# Patient Record
Sex: Female | Born: 1965 | Race: Black or African American | Hispanic: No | Marital: Single | Smoking: Former smoker
Health system: Southern US, Community
[De-identification: ages and names within clinical notes are randomized; demographics above are authoritative.]

## PROBLEM LIST (undated history)

## (undated) DIAGNOSIS — K652 Spontaneous bacterial peritonitis: Secondary | ICD-10-CM

## (undated) DIAGNOSIS — K7460 Unspecified cirrhosis of liver: Secondary | ICD-10-CM

## (undated) DIAGNOSIS — I10 Essential (primary) hypertension: Secondary | ICD-10-CM

## (undated) DIAGNOSIS — D539 Nutritional anemia, unspecified: Secondary | ICD-10-CM

## (undated) DIAGNOSIS — K8020 Calculus of gallbladder without cholecystitis without obstruction: Secondary | ICD-10-CM

## (undated) DIAGNOSIS — K429 Umbilical hernia without obstruction or gangrene: Secondary | ICD-10-CM

## (undated) DIAGNOSIS — R188 Other ascites: Secondary | ICD-10-CM

## (undated) DIAGNOSIS — D696 Thrombocytopenia, unspecified: Secondary | ICD-10-CM

## (undated) DIAGNOSIS — M1990 Unspecified osteoarthritis, unspecified site: Secondary | ICD-10-CM

## (undated) DIAGNOSIS — B1920 Unspecified viral hepatitis C without hepatic coma: Secondary | ICD-10-CM

## (undated) DIAGNOSIS — J90 Pleural effusion, not elsewhere classified: Secondary | ICD-10-CM

## (undated) HISTORY — PX: CARPAL TUNNEL RELEASE: SHX101

## (undated) HISTORY — PX: ANKLE SURGERY: SHX546

## (undated) HISTORY — PX: CHOLECYSTECTOMY: SHX55

---

## 2000-10-15 ENCOUNTER — Emergency Department (HOSPITAL_COMMUNITY): Admission: EM | Admit: 2000-10-15 | Discharge: 2000-10-15 | Payer: Self-pay | Admitting: Emergency Medicine

## 2001-02-11 ENCOUNTER — Emergency Department (HOSPITAL_COMMUNITY): Admission: EM | Admit: 2001-02-11 | Discharge: 2001-02-11 | Payer: Self-pay | Admitting: Emergency Medicine

## 2001-04-23 ENCOUNTER — Emergency Department (HOSPITAL_COMMUNITY): Admission: EM | Admit: 2001-04-23 | Discharge: 2001-04-23 | Payer: Self-pay

## 2001-04-23 ENCOUNTER — Encounter: Payer: Self-pay | Admitting: Emergency Medicine

## 2003-05-09 ENCOUNTER — Emergency Department (HOSPITAL_COMMUNITY): Admission: EM | Admit: 2003-05-09 | Discharge: 2003-05-09 | Payer: Self-pay | Admitting: Emergency Medicine

## 2005-02-03 ENCOUNTER — Emergency Department (HOSPITAL_COMMUNITY): Admission: EM | Admit: 2005-02-03 | Discharge: 2005-02-03 | Payer: Self-pay | Admitting: Emergency Medicine

## 2010-08-22 ENCOUNTER — Emergency Department (HOSPITAL_COMMUNITY)
Admission: EM | Admit: 2010-08-22 | Discharge: 2010-08-22 | Disposition: A | Discharge status: Home / Self Care | Payer: Self-pay | Attending: Emergency Medicine | Admitting: Emergency Medicine

## 2010-08-22 DIAGNOSIS — N61 Mastitis without abscess: Secondary | ICD-10-CM | POA: Insufficient documentation

## 2010-08-22 DIAGNOSIS — L089 Local infection of the skin and subcutaneous tissue, unspecified: Secondary | ICD-10-CM | POA: Insufficient documentation

## 2010-08-22 DIAGNOSIS — R21 Rash and other nonspecific skin eruption: Secondary | ICD-10-CM | POA: Insufficient documentation

## 2011-06-25 ENCOUNTER — Encounter (HOSPITAL_COMMUNITY): Payer: Self-pay | Admitting: *Deleted

## 2011-06-25 ENCOUNTER — Emergency Department (HOSPITAL_COMMUNITY)
Admission: EM | Admit: 2011-06-25 | Discharge: 2011-06-25 | Disposition: A | Discharge status: Home / Self Care | Payer: Self-pay | Attending: Emergency Medicine | Admitting: Emergency Medicine

## 2011-06-25 DIAGNOSIS — L299 Pruritus, unspecified: Secondary | ICD-10-CM | POA: Insufficient documentation

## 2011-06-25 DIAGNOSIS — F17200 Nicotine dependence, unspecified, uncomplicated: Secondary | ICD-10-CM | POA: Insufficient documentation

## 2011-06-25 LAB — POCT I-STAT, CHEM 8
HCT: 50 % — ABNORMAL HIGH (ref 36.0–46.0)
Hemoglobin: 17 g/dL — ABNORMAL HIGH (ref 12.0–15.0)
Potassium: 5 mEq/L (ref 3.5–5.1)
Sodium: 141 mEq/L (ref 135–145)

## 2011-06-25 MED ORDER — HYDROXYZINE HCL 25 MG PO TABS
25.0000 mg | ORAL_TABLET | Freq: Once | ORAL | Status: AC
  Administered 2011-06-25: 25 mg via ORAL
  Filled 2011-06-25: qty 1

## 2011-06-25 MED ORDER — HYDROXYZINE HCL 25 MG PO TABS: 25.0000 mg | ORAL_TABLET | Freq: Once | ORAL | Status: AC

## 2011-06-25 NOTE — ED Provider Notes (Signed)
History   This chart was scribed for Linda Munch, MD by Shari Heritage. The patient was seen in room TR09C/TR09C. Patient's care was started at 1148.     CSN: [id number]  Arrival date & time 06/25/11  1148   First MD Initiated Contact with Patient 06/25/11 1313      Chief Complaint  Patient presents with  . Pruritis    (Consider location/radiation/quality/duration/timing/severity/associated sxs/prior treatment) The history is provided by the patient. No language interpreter was used.   Linda Vaughn is a 46 y.o. female who presents to the Emergency Department complaining of constant pruiritis all over her body with associated pain onset 3 days ago. Patient denies any rash. Patient believes that the itchiness may be due to anxiety or nerves. Patient denies a new job, detergent, sheets or other factors. Patient says she took children's Benadryl (liquid) yesterday with no relief. Patient is a current everyday smoker. Patient says that she has stopped for 6 years before. Patient has no PCP. Patient says the last time she saw a physician was when she was incarcerated. Patient was released in 2012. Patient says she has a h/o of polysubstance abuse, but has been sober for 7 years now.  History reviewed. No pertinent past medical history.  Past Surgical History  Procedure Date  . Ankle surgery   . Carpal tunnel release     No family history on file.  History  Substance Use Topics  . Smoking status: Current Everyday Smoker  . Smokeless tobacco: Not on file  . Alcohol Use: Yes    OB History    Grav Para Term Preterm Abortions TAB SAB Ect Mult Living                  Review of Systems  Constitutional:       Per HPI, otherwise negative  HENT:       Per HPI, otherwise negative  Eyes: Negative.   Respiratory:       Per HPI, otherwise negative  Cardiovascular:       Per HPI, otherwise negative  Gastrointestinal: Negative for vomiting.  Genitourinary: Negative.     Musculoskeletal:       Per HPI, otherwise negative  Skin: Negative.   Neurological: Negative for syncope.    Allergies  Review of patient's allergies indicates no known allergies.  Home Medications  No current outpatient prescriptions on file.  BP 135/89  Pulse 95  Temp 98.2 F (36.8 C) (Oral)  Resp 18  Ht 5' (1.524 m)  Wt 220 lb (99.791 kg)  BMI 42.97 kg/m2  SpO2 97%  Physical Exam  Nursing note and vitals reviewed. Constitutional: She is oriented to person, place, and time. She appears well-developed and well-nourished. No distress.  HENT:  Head: Normocephalic and atraumatic.  Eyes: Conjunctivae and EOM are normal.  Cardiovascular: Normal rate and regular rhythm.   Pulmonary/Chest: Effort normal and breath sounds normal. No stridor. No respiratory distress.  Abdominal: She exhibits no distension.  Musculoskeletal: She exhibits no edema.  Neurological: She is alert and oriented to person, place, and time. No cranial nerve deficit.  Skin: Skin is warm and dry.  Psychiatric: She has a normal mood and affect.    ED Course  Procedures (including critical care time)   COORDINATION OF CARE: 1:50PM- Patient informed of current plan for treatment and evaluation and agrees with plan at this time.   Results for orders placed during the hospital encounter of 06/25/11  POCT I-STAT, CHEM 8  Component Value Range   Sodium 141  135 - 145 mEq/L   Potassium 5.0  3.5 - 5.1 mEq/L   Chloride 106  96 - 112 mEq/L   BUN 16  6 - 23 mg/dL   Creatinine, Ser 1.61  0.50 - 1.10 mg/dL   Glucose, Bld 86  70 - 99 mg/dL   Calcium, Ion 0.96  1.12 - 1.32 mmol/L   TCO2 25  0 - 100 mmol/L   Hemoglobin 17.0 (*) 12.0 - 15.0 g/dL   HCT 04.5 (*) 40.9 - 81.1 %    No diagnosis found.   On repeat evaluation the patient notes resolution of her symptoms. MDM  I personally performed the services described in this documentation, which was scribed in my presence. The recorded information has  been reviewed and considered.  This generally well female presents with new diffuse pruritic complaint.  On exam she is in no distress, with no discernible cutaneous lesions.  The patient's denial of any respiratory complaints his reassuring.  Following provision of Atarax the patient had resolution of her symptoms.  Although a clear etiology is not discernible, there is some suspicion of ongoing allergic process.  This was discussed with the patient, who was discharged in stable condition with her symptoms resolved  Linda Munch, MD 06/26/11 [phone]

## 2011-06-25 NOTE — ED Notes (Signed)
Patient reports onset of itching and pain all over for 3 days.  She has tried benadryl w/o relief.  Patient states it feels like it it related to her nerves.  Patient denies rash

## 2011-06-25 NOTE — Discharge Instructions (Signed)
It is very important that you continue to have your condition and monitored by your primary care physician.  Please make sure to call today to arrange the next appropriate followup visit.  If you develop any new, or concerning changes in your condition, please return to the emergency department he immediately.

## 2011-11-12 ENCOUNTER — Emergency Department (HOSPITAL_COMMUNITY)
Admission: EM | Admit: 2011-11-12 | Discharge: 2011-11-12 | Disposition: A | Discharge status: Home / Self Care | Payer: Self-pay | Attending: Emergency Medicine | Admitting: Emergency Medicine

## 2011-11-12 ENCOUNTER — Emergency Department (HOSPITAL_COMMUNITY): Payer: Self-pay

## 2011-11-12 ENCOUNTER — Encounter (HOSPITAL_COMMUNITY): Payer: Self-pay | Admitting: Family Medicine

## 2011-11-12 DIAGNOSIS — M7120 Synovial cyst of popliteal space [Baker], unspecified knee: Secondary | ICD-10-CM

## 2011-11-12 DIAGNOSIS — F17200 Nicotine dependence, unspecified, uncomplicated: Secondary | ICD-10-CM | POA: Insufficient documentation

## 2011-11-12 DIAGNOSIS — M19079 Primary osteoarthritis, unspecified ankle and foot: Secondary | ICD-10-CM

## 2011-11-12 DIAGNOSIS — M7989 Other specified soft tissue disorders: Secondary | ICD-10-CM

## 2011-11-12 DIAGNOSIS — M79609 Pain in unspecified limb: Secondary | ICD-10-CM

## 2011-11-12 LAB — CBC WITH DIFFERENTIAL/PLATELET
Eosinophils Absolute: 0.1 10*3/uL (ref 0.0–0.7)
Lymphocytes Relative: 65 % — ABNORMAL HIGH (ref 12–46)
Lymphs Abs: 4.4 10*3/uL — ABNORMAL HIGH (ref 0.7–4.0)
Neutrophils Relative %: 21 % — ABNORMAL LOW (ref 43–77)
Platelets: 165 10*3/uL (ref 150–400)
RBC: 3.91 MIL/uL (ref 3.87–5.11)
WBC: 6.7 10*3/uL (ref 4.0–10.5)

## 2011-11-12 LAB — BASIC METABOLIC PANEL
CO2: 22 mEq/L (ref 19–32)
GFR calc non Af Amer: >90 mL/min (ref >90)
Glucose, Bld: 115 mg/dL — ABNORMAL HIGH (ref 70–99)
Potassium: 4.2 mEq/L (ref 3.5–5.1)
Sodium: 138 mEq/L (ref 135–145)

## 2011-11-12 LAB — PROTIME-INR
INR: 1.15 (ref 0.00–1.49)
Prothrombin Time: 14.5 seconds (ref 11.6–15.2)

## 2011-11-12 LAB — APTT: aPTT: 36 seconds (ref 24–37)

## 2011-11-12 MED ORDER — HYDROCODONE-ACETAMINOPHEN 5-325 MG PO TABS
1.0000 | ORAL_TABLET | Freq: Once | ORAL | Status: AC
  Administered 2011-11-12: 1 via ORAL
  Filled 2011-11-12: qty 1

## 2011-11-12 MED ORDER — NAPROXEN 250 MG PO TABS
500.0000 mg | ORAL_TABLET | Freq: Once | ORAL | Status: AC
  Administered 2011-11-12: 500 mg via ORAL
  Filled 2011-11-12: qty 2

## 2011-11-12 MED ORDER — HYDROCODONE-ACETAMINOPHEN 5-325 MG PO TABS: 1.0000 | ORAL_TABLET | Freq: Four times a day (QID) | ORAL | Status: DC | PRN

## 2011-11-12 NOTE — ED Provider Notes (Addendum)
History   This chart was scribed for Celene Kras, MD by Shari Heritage. This patient was seen in room TR06C/TR06C and the patient's care was started at 1:10PM.   CSN: [id number]  Arrival date & time 11/12/11  1247   First MD Initiated Contact with Patient 11/12/11 1310      Chief Complaint  Patient presents with  . Ankle Pain    The history is provided by the patient. No language interpreter was used.   Linda Vaughn is a 46 y.o. female who presents to the Emergency Department complaining of constant, sharp, moderate to severe, constant, bilateral lower leg pain and swelling onset 3 weeks ago. Patient also reports moderate, constant, gradually worsening left ankle pain. Patient denies chest pain or SOB. Patient denies any recent falls, injury or trauma. Patient has a history of ankle surgery and states there is a plate in her left ankle. She is not on any estrogen medication. Patient denies any recent long plane or car trips. Patient has no PCP and has not followed up with anyone else about these issues. She states that she has no health insurance. Patient is a current every day smoker.  Past Surgical History  Procedure Date  . Ankle surgery   . Carpal tunnel release     History reviewed. No pertinent family history.  History  Substance Use Topics  . Smoking status: Current Every Day Smoker  . Smokeless tobacco: Not on file  . Alcohol Use: Yes   No OB history provided.  Review of Systems A complete 10 system review of systems was obtained and all systems are negative except as noted in the HPI and PMH.   Allergies  Review of patient's allergies indicates no known allergies.  Home Medications  No current outpatient prescriptions on file.  BP 133/76  Pulse 98  Temp 98 F (36.7 C)  Resp 18  SpO2 98%  Physical Exam  Nursing note and vitals reviewed. Constitutional: She appears well-developed and well-nourished. No distress.  HENT:  Head: Normocephalic and atraumatic.    Right Ear: External ear normal.  Left Ear: External ear normal.  Eyes: Conjunctivae normal are normal. Right eye exhibits no discharge. Left eye exhibits no discharge. No scleral icterus.  Neck: Neck supple. No tracheal deviation present.  Cardiovascular: Normal rate, regular rhythm and intact distal pulses.   Pulmonary/Chest: Effort normal and breath sounds normal. No stridor. No respiratory distress. She has no wheezes. She has no rales.  Abdominal: Soft. Bowel sounds are normal. She exhibits no distension. There is no tenderness. There is no rebound and no guarding.  Musculoskeletal: She exhibits no edema and no tenderness.       Hypersensitive to the touch left ankle and left calf. Mild tenderness to right calf as well. No edema or erythema. No cords.   Neurological: She is alert. She has normal strength. No sensory deficit. Cranial nerve deficit:  no gross defecits noted. She exhibits normal muscle tone. She displays no seizure activity. Coordination normal.  Skin: Skin is warm and dry. No rash noted.  Psychiatric: She has a normal mood and affect.    ED Course  Procedures (including critical care time)  DIAGNOSTIC STUDIES: Oxygen Saturation is 98% on room air, nomral by my interpretation.    COORDINATION OF CARE: 1:14 PM Discussed treatment plan with pt at bedside and pt agreed to plan.   Labs Reviewed  D-DIMER, QUANTITATIVE - Abnormal; Notable for the following:    D-Dimer, Quant 1.27 (*)  All other components within normal limits    Dg Ankle Complete Left  11/12/2011  *RADIOLOGY REPORT*  Clinical Data: Ankle pain  LEFT ANKLE COMPLETE - 3+ VIEW  Comparison: None.  Findings: Three views of the left ankle submitted.  No acute fracture or subluxation.  Two metallic fixation screws are noted in distal left tibia medial malleolus.  Metallic fixation plate and screws noted in distal left fibula.  Degenerative changes noted tibiotalar and fibulotalar joint.  Diffuse mild soft  tissue swelling.  Plantar spur of the calcaneus is noted.  Mild dorsal spurring tarsal region.  IMPRESSION: No acute fracture or subluxation.  Postsurgical and degenerative changes as described above.   Original Report Authenticated By: Natasha Mead, M.D.       MDM  Pt has symptoms most suggestive of arthritis issues associated with her prior injury however she does have some calf ttp and complaints of pain in that area. D dimer positive.  Will plan on ultrasound for further evaluation.      I personally performed the services described in this documentation, which was scribed in my presence.  The recorded information has been reviewed and considered.    Celene Kras, MD 11/12/11 1505  5:30 PM  VASCULAR LAB  PRELIMINARY PRELIMINARY PRELIMINARY PRELIMINARY  Bilateral lower extremity venous duplex completed.  Preliminary report: Bilateral: No evidence of DVT or superficial thrombosis. Left: Baker's cyst in the popliteal fossa.  CESTONE, HELENE, RVT  11/12/2011, 5:16 PM    DC home with ortho follow up.    Celene Kras, MD 11/12/11 [phone]

## 2011-11-12 NOTE — Progress Notes (Signed)
VASCULAR LAB PRELIMINARY  PRELIMINARY  PRELIMINARY  PRELIMINARY  Bilateral lower extremity venous duplex  completed.    Preliminary report:  Bilateral:  No evidence of DVT or superficial thrombosis.  Left: Baker's cyst in the popliteal fossa.   Kvon Mcilhenny, RVT 11/12/2011, 5:16 PM

## 2011-11-12 NOTE — ED Notes (Signed)
Paged ortho 

## 2011-11-12 NOTE — ED Notes (Addendum)
Per pt having left ankle pain and swelling. Denies injury sts she has a plate in left ankle. sts also left wrist and hand pain.

## 2011-11-12 NOTE — Progress Notes (Signed)
Orthopedic Tech Progress Note Patient Details:  Linda Vaughn May 28, 1965 [id number]  Ortho Devices Type of Ortho Device: ASO;Crutches Ortho Device/Splint Location: left ankle Ortho Device/Splint Interventions: Application   Linda Vaughn 11/12/2011, 5:50 PM

## 2011-11-12 NOTE — ED Notes (Signed)
Pt c/o bilateral lower leg swelling, pt reports she has had swelling in left leg from a plate that she previously had.

## 2012-01-02 ENCOUNTER — Encounter (HOSPITAL_COMMUNITY): Payer: Self-pay | Admitting: Emergency Medicine

## 2012-01-02 ENCOUNTER — Emergency Department (HOSPITAL_COMMUNITY)
Admission: EM | Admit: 2012-01-02 | Discharge: 2012-01-02 | Disposition: A | Discharge status: Home / Self Care | Payer: Self-pay | Attending: Emergency Medicine | Admitting: Emergency Medicine

## 2012-01-02 ENCOUNTER — Emergency Department (HOSPITAL_COMMUNITY): Payer: Self-pay

## 2012-01-02 DIAGNOSIS — R1013 Epigastric pain: Secondary | ICD-10-CM | POA: Insufficient documentation

## 2012-01-02 DIAGNOSIS — R109 Unspecified abdominal pain: Secondary | ICD-10-CM

## 2012-01-02 DIAGNOSIS — F17200 Nicotine dependence, unspecified, uncomplicated: Secondary | ICD-10-CM | POA: Insufficient documentation

## 2012-01-02 DIAGNOSIS — Z8739 Personal history of other diseases of the musculoskeletal system and connective tissue: Secondary | ICD-10-CM | POA: Insufficient documentation

## 2012-01-02 HISTORY — DX: Unspecified osteoarthritis, unspecified site: M19.90

## 2012-01-02 LAB — COMPREHENSIVE METABOLIC PANEL
ALT: 78 U/L — ABNORMAL HIGH (ref 0–35)
AST: 156 U/L — ABNORMAL HIGH (ref 0–37)
Albumin: 2.9 g/dL — ABNORMAL LOW (ref 3.5–5.2)
Alkaline Phosphatase: 152 U/L — ABNORMAL HIGH (ref 39–117)
Glucose, Bld: 109 mg/dL — ABNORMAL HIGH (ref 70–99)
Potassium: 5.4 mEq/L — ABNORMAL HIGH (ref 3.5–5.1)
Sodium: 134 mEq/L — ABNORMAL LOW (ref 135–145)
Total Protein: 8.4 g/dL — ABNORMAL HIGH (ref 6.0–8.3)

## 2012-01-02 LAB — CBC
Hemoglobin: 14.1 g/dL (ref 12.0–15.0)
MCHC: 34.4 g/dL (ref 30.0–36.0)
Platelets: 188 10*3/uL (ref 150–400)

## 2012-01-02 LAB — URINALYSIS, ROUTINE W REFLEX MICROSCOPIC
Glucose, UA: NEGATIVE mg/dL
Hgb urine dipstick: NEGATIVE
Specific Gravity, Urine: 1.041 — ABNORMAL HIGH (ref 1.005–1.030)
pH: 6 (ref 5.0–8.0)

## 2012-01-02 LAB — URINE MICROSCOPIC-ADD ON

## 2012-01-02 LAB — POCT I-STAT TROPONIN I: Troponin i, poc: 0.01 ng/mL (ref 0.00–0.08)

## 2012-01-02 MED ORDER — ONDANSETRON HCL 4 MG/2ML IJ SOLN
4.0000 mg | Freq: Once | INTRAMUSCULAR | Status: AC
  Administered 2012-01-02: 4 mg via INTRAVENOUS
  Filled 2012-01-02: qty 2

## 2012-01-02 MED ORDER — HYDROCODONE-ACETAMINOPHEN 5-325 MG PO TABS: 2.0000 | ORAL_TABLET | ORAL | Status: DC | PRN

## 2012-01-02 MED ORDER — ESOMEPRAZOLE MAGNESIUM 40 MG PO CPDR: 40.0000 mg | DELAYED_RELEASE_CAPSULE | Freq: Every day | ORAL | Status: DC

## 2012-01-02 MED ORDER — MORPHINE SULFATE 4 MG/ML IJ SOLN
4.0000 mg | Freq: Once | INTRAMUSCULAR | Status: AC
  Administered 2012-01-02: 4 mg via INTRAVENOUS
  Filled 2012-01-02: qty 1

## 2012-01-02 NOTE — ED Notes (Signed)
Patient still unable to void at this time. Patient being transported to ultrasound and will try to void once she returns

## 2012-01-02 NOTE — ED Notes (Signed)
Verbal order given by ellen/rn to in and out cath patient.  Sterile technique used during procedure.  No complications to report.  Patient tolerated well.  Urine amber in color and clear.

## 2012-01-02 NOTE — ED Notes (Signed)
Patient transported to Ultrasound 

## 2012-01-02 NOTE — ED Notes (Signed)
Patient with abdominal pain with some nausea since 3am this morning.  Patient denies any CP.  Patient denies vomiting.

## 2012-01-02 NOTE — ED Notes (Signed)
Informed patient that we need a urine sample. Patient unable to void at this time. Patient will alert staff when she can use the restroom. Will continue to monitor.

## 2012-01-02 NOTE — ED Provider Notes (Signed)
Assumed care for patient at shift change with ultrasound pending. Ultrasound does show cholelithiasis. There is some gallbladder wall thickening, but no apparent cholecystic fluid. Consider cholecystitis, but doubt. Patient with no tenderness on my exam. She's not distended. She is afebrile. Has not been having vomiting. No leukocytosis. Her pain is currently reported to be minimal. Will discharge the prescription for pain medicine and surgical followup. Emergent return cautions were discussed.  Raeford Razor, MD 01/02/12 [phone]

## 2012-01-02 NOTE — ED Provider Notes (Addendum)
History     CSN: [id number]  Arrival date & time 01/02/12  0509   First MD Initiated Contact with Patient 01/02/12 0522      Chief Complaint  Patient presents with  . Abdominal Pain    (Consider location/radiation/quality/duration/timing/severity/associated sxs/prior treatment) Patient is a 46 y.o. female presenting with abdominal pain. The history is provided by the patient.  Abdominal Pain The primary symptoms of the illness include abdominal pain. The current episode started 1 to 2 hours ago. The onset of the illness was gradual. The problem has not changed since onset. The patient states that she believes she is currently not pregnant. The patient has not had a change in bowel habit.    Past Medical History  Diagnosis Date  . Arthritis     Past Surgical History  Procedure Date  . Ankle surgery   . Carpal tunnel release     No family history on file.  History  Substance Use Topics  . Smoking status: Current Every Day Smoker  . Smokeless tobacco: Not on file  . Alcohol Use: Yes    OB History    Grav Para Term Preterm Abortions TAB SAB Ect Mult Living                  Review of Systems  Gastrointestinal: Positive for abdominal pain.  All other systems reviewed and are negative.    Allergies  Review of patient's allergies indicates no known allergies.  Home Medications   Current Outpatient Rx  Name  Route  Sig  Dispense  Refill  . HYDROCODONE-ACETAMINOPHEN 5-325 MG PO TABS   Oral   Take 1-2 tablets by mouth every 6 (six) hours as needed for pain.   16 tablet   0     BP 141/98  Pulse 79  Temp 98.2 F (36.8 C) (Oral)  Resp 16  SpO2 96%  Physical Exam  Constitutional: She is oriented to person, place, and time. She appears well-developed and well-nourished.  HENT:  Head: Normocephalic and atraumatic.  Eyes: Conjunctivae normal and EOM are normal. Pupils are equal, round, and reactive to light.  Neck: Normal range of motion.    Cardiovascular: Normal rate, regular rhythm and normal heart sounds.   Pulmonary/Chest: Effort normal and breath sounds normal.  Abdominal: Soft. Bowel sounds are normal. There is tenderness.    Musculoskeletal: Normal range of motion.  Neurological: She is alert and oriented to person, place, and time.  Skin: Skin is warm and dry.  Psychiatric: She has a normal mood and affect. Her behavior is normal.    ED Course  Procedures (including critical care time)   Labs Reviewed  CBC  COMPREHENSIVE METABOLIC PANEL  LIPASE, BLOOD  URINALYSIS, ROUTINE W REFLEX MICROSCOPIC   No results found.   No diagnosis found.    MDM  + epigastric pain since am.  Will lab,  reassess        Rosanne Ashing, MD 01/02/12 0549  Oluwadara Gorman Lytle Michaels, MD 01/02/12 [phone]

## 2012-01-03 LAB — URINE CULTURE: Culture: NO GROWTH

## 2012-04-07 ENCOUNTER — Emergency Department (HOSPITAL_COMMUNITY)
Admission: EM | Admit: 2012-04-07 | Discharge: 2012-04-07 | Disposition: A | Discharge status: Home / Self Care | Payer: Self-pay | Attending: Emergency Medicine | Admitting: Emergency Medicine

## 2012-04-07 ENCOUNTER — Emergency Department (HOSPITAL_COMMUNITY): Payer: Self-pay

## 2012-04-07 ENCOUNTER — Encounter (HOSPITAL_COMMUNITY): Payer: Self-pay | Admitting: Emergency Medicine

## 2012-04-07 DIAGNOSIS — Z7982 Long term (current) use of aspirin: Secondary | ICD-10-CM | POA: Insufficient documentation

## 2012-04-07 DIAGNOSIS — IMO0002 Reserved for concepts with insufficient information to code with codable children: Secondary | ICD-10-CM | POA: Insufficient documentation

## 2012-04-07 DIAGNOSIS — M1710 Unilateral primary osteoarthritis, unspecified knee: Secondary | ICD-10-CM | POA: Insufficient documentation

## 2012-04-07 DIAGNOSIS — M1990 Unspecified osteoarthritis, unspecified site: Secondary | ICD-10-CM

## 2012-04-07 DIAGNOSIS — B353 Tinea pedis: Secondary | ICD-10-CM

## 2012-04-07 DIAGNOSIS — M7989 Other specified soft tissue disorders: Secondary | ICD-10-CM | POA: Insufficient documentation

## 2012-04-07 DIAGNOSIS — M7120 Synovial cyst of popliteal space [Baker], unspecified knee: Secondary | ICD-10-CM

## 2012-04-07 DIAGNOSIS — F17200 Nicotine dependence, unspecified, uncomplicated: Secondary | ICD-10-CM | POA: Insufficient documentation

## 2012-04-07 DIAGNOSIS — M79609 Pain in unspecified limb: Secondary | ICD-10-CM

## 2012-04-07 DIAGNOSIS — I10 Essential (primary) hypertension: Secondary | ICD-10-CM

## 2012-04-07 LAB — POCT I-STAT, CHEM 8
Chloride: 111 mEq/L (ref 96–112)
Glucose, Bld: 114 mg/dL — ABNORMAL HIGH (ref 70–99)
HCT: 42 % (ref 36.0–46.0)
Potassium: 4.3 mEq/L (ref 3.5–5.1)
Sodium: 137 mEq/L (ref 135–145)

## 2012-04-07 MED ORDER — MICONAZOLE NITRATE 2 % EX CREA: TOPICAL_CREAM | Freq: Two times a day (BID) | CUTANEOUS | Status: DC

## 2012-04-07 MED ORDER — NAPROXEN 375 MG PO TABS: 375.0000 mg | ORAL_TABLET | Freq: Two times a day (BID) | ORAL | Status: DC

## 2012-04-07 MED ORDER — IBUPROFEN 800 MG PO TABS
800.0000 mg | ORAL_TABLET | Freq: Once | ORAL | Status: AC
  Administered 2012-04-07: 800 mg via ORAL
  Filled 2012-04-07: qty 1

## 2012-04-07 MED ORDER — IBUPROFEN 800 MG PO TABS: 800.0000 mg | ORAL_TABLET | Freq: Three times a day (TID) | ORAL | Status: DC | PRN

## 2012-04-07 NOTE — Progress Notes (Signed)
VASCULAR LAB PRELIMINARY  PRELIMINARY  PRELIMINARY  PRELIMINARY  Bilateral lower extremity venous duplex completed.    Preliminary report:  Bilateral:  No evidence of DVT or superficial thrombosis. No evidence of Right Baker's cyst. There is a small Baker's cyst in the left popliteal fossa measuring 2.1 cm x 1.2 cm   Elgene Coral, RVS 04/07/2012, 1:47 PM

## 2012-04-07 NOTE — ED Notes (Signed)
Pt here dropped off via pov for c/o bilat leg pain with rt knee pain x3 wks pain not relieved with otc meds

## 2012-04-07 NOTE — ED Provider Notes (Signed)
History     CSN: [id number]  Arrival date & time 04/07/12  1024   First MD Initiated Contact with Patient 04/07/12 1025      Chief Complaint  Patient presents with  . Extremity Pain    rt knee  . Knee Pain    (Consider location/radiation/quality/duration/timing/severity/associated sxs/prior treatment) HPI Comments: Linda Vaughn is a 47 y.o. female with history of arthritis presents emergency department complaining of bilateral lower 70 pain.  Onset of symptoms began approximately 4-5 weeks ago and has been gradually worsening.  Pain is described as both knee pain and calf pain.  Patient reports knee stiffness in morning lasting anywhere from 30 minutes to an hour as well as after sitting for long times.  Associated symptoms or crepitus.  Calf pain onset was 2-3 weeks ago, worsened with ambulation and relieved with rest.  Associated symptoms include bilateral leg swelling.  Patient denies history of DVT, recent travel, cough, hemoptysis, chest pain, shortness of breath, dyspnea on exertion.  She is not currently on hormone replacement therapy.  Lastly patient reports that she's been suffering from pruritus of the bottom of her feet and is concerned that she has a fungal infection.  No other complaints this time.  Patient denies fevers, night sweats, chills, recent trauma.  The history is provided by the patient.    Past Medical History  Diagnosis Date  . Arthritis     Past Surgical History  Procedure Laterality Date  . Ankle surgery    . Carpal tunnel release      Family History  Problem Relation Age of Onset  . Cancer Mother   . Rheum arthritis Father   . Diabetes Other     History  Substance Use Topics  . Smoking status: Current Every Day Smoker  . Smokeless tobacco: Not on file  . Alcohol Use: Yes    OB History   Grav Para Term Preterm Abortions TAB SAB Ect Mult Living                  Review of Systems  All other systems reviewed and are  negative.    Allergies  Review of patient's allergies indicates no known allergies.  Home Medications   Current Outpatient Rx  Name  Route  Sig  Dispense  Refill  . aspirin 325 MG tablet   Oral   Take 325 mg by mouth every 6 (six) hours as needed for pain.           BP 146/100  Pulse 87  Temp(Src) 98.1 F (36.7 C) (Oral)  Resp 18  SpO2 96%  Physical Exam  Nursing note and vitals reviewed. Constitutional: She is oriented to person, place, and time. She appears well-developed and well-nourished. No distress.  Hypertensive   HENT:  Head: Normocephalic and atraumatic.  Eyes: Conjunctivae and EOM are normal.  Neck: Normal range of motion. Neck supple.  Cardiovascular:  Intact distal pulses, capillary refill < 3 seconds  Pulmonary/Chest: Effort normal.  Musculoskeletal: Normal range of motion.  Significant tenderness to palpation of both m these extending posteriorly from calf to thigh as well as anterior knees.  Pain with both passive and active flexion and extension.  All other extremities with normal ROM  Neurological: She is alert and oriented to person, place, and time.  No sensory deficit  Skin: Skin is warm and dry. No rash noted. She is not diaphoretic.  Thickened peeling skin on plantar surface of feet bilaterally  Psychiatric: She  has a normal mood and affect. Her behavior is normal.    ED Course  Procedures (including critical care time)  Labs Reviewed  POCT I-STAT, CHEM 8 - Abnormal; Notable for the following:    Glucose, Bld 114 (*)    Calcium, Ion 0.93 (*)    All other components within normal limits   Dg Knee Complete 4 Views Left  04/07/2012  *RADIOLOGY REPORT*  Clinical Data: Knee pain  LEFT KNEE - COMPLETE 4+ VIEW  Comparison: None.  Findings: No fracture.  No subluxation or dislocation.  No joint effusion.  Hypertrophic spurring is visible in the medial and patellofemoral compartments.  IMPRESSION: Mild degenerative changes without acute bony  findings.   Original Report Authenticated By: Kennith Center, M.D.    Dg Knee Complete 4 Views Right  04/07/2012  *RADIOLOGY REPORT*  Clinical Data: The anterior pain.  No known injury.  RIGHT KNEE - COMPLETE 4+ VIEW  Comparison: None.  Findings: No detectable joint effusion.  Weightbearing compartments show normal joint height.  There is mild narrowing at the patellofemoral joint.  There is some calcification in the quadriceps and patellar tendons of a chronic nature.  IMPRESSION: Evidence of patellofemoral osteoarthritis.  No other finding of note.   Original Report Authenticated By: Paulina Fusi, M.D.    Per Vascular Imaging: No evidence of DVT. Positive Baker cyst as seen before  No diagnosis found.    MDM  Arthritis- dc with naproxen and PCP follow up  Hypertension- advised PCP follow up and re-start of medication (pt is supposed to be on Norvasc, but has been non compliant). Had in depth discussion about problems poor controlled BP can cause. Pt verbalized understanding. Presentation non concerning for any end organ failure. Crt and BUN nl.  Tinea pedis- topical antifungal given         Jaci Carrel, PA-C 04/07/12 1408

## 2012-04-08 NOTE — ED Provider Notes (Signed)
Medical screening examination/treatment/procedure(s) were performed by non-physician practitioner and as supervising physician I was immediately available for consultation/collaboration.  Christopher J. Pollina, MD 04/08/12 1301 

## 2012-04-16 ENCOUNTER — Encounter (HOSPITAL_COMMUNITY): Payer: Self-pay | Admitting: Cardiology

## 2012-04-16 ENCOUNTER — Emergency Department (HOSPITAL_COMMUNITY)
Admission: EM | Admit: 2012-04-16 | Discharge: 2012-04-16 | Disposition: A | Discharge status: Home / Self Care | Payer: Self-pay | Attending: Emergency Medicine | Admitting: Emergency Medicine

## 2012-04-16 DIAGNOSIS — I10 Essential (primary) hypertension: Secondary | ICD-10-CM | POA: Insufficient documentation

## 2012-04-16 DIAGNOSIS — M79605 Pain in left leg: Secondary | ICD-10-CM

## 2012-04-16 DIAGNOSIS — F17200 Nicotine dependence, unspecified, uncomplicated: Secondary | ICD-10-CM | POA: Insufficient documentation

## 2012-04-16 DIAGNOSIS — M129 Arthropathy, unspecified: Secondary | ICD-10-CM | POA: Insufficient documentation

## 2012-04-16 DIAGNOSIS — M25569 Pain in unspecified knee: Secondary | ICD-10-CM | POA: Insufficient documentation

## 2012-04-16 DIAGNOSIS — Z8719 Personal history of other diseases of the digestive system: Secondary | ICD-10-CM | POA: Insufficient documentation

## 2012-04-16 MED ORDER — HYDROCODONE-ACETAMINOPHEN 5-325 MG PO TABS: 1.0000 | ORAL_TABLET | Freq: Four times a day (QID) | ORAL | Status: DC | PRN

## 2012-04-16 NOTE — ED Provider Notes (Signed)
History     CSN: [id number]  Arrival date & time 04/16/12  6962   First MD Initiated Contact with Patient 04/16/12 0902      Chief Complaint  Patient presents with  . Leg Pain    (Consider location/radiation/quality/duration/timing/severity/associated sxs/prior treatment) HPI Comments: Patient with a history of Arthritis presenting with bilateral knee pain.   Pain has been present for the past 6 weeks and is gradually worsening.  Pain worse with walking for an extended period of time.  She states that she does a lot of walking at her job.   She reports that the pain extends to the calf area, but is primarily in the knee.  She denies numbness or tingling.  Denies erythema or edema.  No acute injury or trauma.  She has not noticed any erythema, edema, or warmth.  No history of DVT.  Patient denies recent travel, cough, hemoptysis, chest pain, shortness of breath, dyspnea on exertion.  She is not currently on hormone replacement therapy.   She has been taking Ibuprofen for the pain without relief.  She has had recent xrays of both knees, which were negative aside from OA.  She also had a recent Doppler of both legs, which was negative for DVT.  Patient is a 47 y.o. female presenting with leg pain. The history is provided by the patient.  Leg Pain Associated symptoms: no fever     Past Medical History  Diagnosis Date  . Arthritis     Past Surgical History  Procedure Laterality Date  . Ankle surgery    . Carpal tunnel release      Family History  Problem Relation Age of Onset  . Cancer Mother   . Rheum arthritis Father   . Diabetes Other     History  Substance Use Topics  . Smoking status: Current Every Day Smoker  . Smokeless tobacco: Not on file  . Alcohol Use: Yes    OB History   Grav Para Term Preterm Abortions TAB SAB Ect Mult Living                  Review of Systems  Constitutional: Negative for fever and chills.  Respiratory: Negative for shortness of breath.    Cardiovascular: Negative for chest pain.  Musculoskeletal:       Bilateral knee pain  Skin: Negative for color change.  All other systems reviewed and are negative.    Allergies  Review of patient's allergies indicates no known allergies.  Home Medications  No current outpatient prescriptions on file.  BP 132/92  Pulse 76  Temp(Src) 97.5 F (36.4 C) (Oral)  Resp 18  SpO2 97%  Physical Exam  Nursing note and vitals reviewed. Constitutional: She appears well-developed and well-nourished. No distress.  HENT:  Head: Normocephalic and atraumatic.  Cardiovascular: Normal rate, regular rhythm and normal heart sounds.   Pulses:      Dorsalis pedis pulses are 2+ on the right side, and 2+ on the left side.  Pulmonary/Chest: Effort normal and breath sounds normal.  Musculoskeletal: Normal range of motion.       Right knee: She exhibits normal range of motion, no swelling, no deformity and no erythema. Tenderness found.       Left knee: She exhibits normal range of motion, no swelling and no erythema. Tenderness found.  No edema, erythema, or warmth of the lower extremities bilaterally Negative Homan's sign bilaterally  Neurological: She is alert. No sensory deficit.  Skin: Skin is  warm and dry. She is not diaphoretic. No erythema.  Psychiatric: She has a normal mood and affect.    ED Course  Procedures (including critical care time)  Labs Reviewed - No data to display No results found.   No diagnosis found.    MDM  Patient presents to the ED for bilateral knee pain.  Full ROM.  No obvious signs of infection.  She had been seen in the ED previously for this and had xrays that were negative aside from OA.  She has also had a bilateral Doppler, which was negative for DVT on 04/07/12.  Therefore, feel that pain may be secondary to Arthritis.  Patient given short course of pain medication and instructed to follow up with PCP.        Pascal Lux [place], PA-C 04/18/12  [phone]

## 2012-04-16 NOTE — ED Notes (Signed)
Pt c/o bilateral calf and knee pain, exacerbated with weight bearing and palpation. No reddness, warmth or obvious size difference between calves. Pulses palpated in distal extremities. Sensation intact. Denies bowel bladder incontinence. Pt able to ambulate without difficulty.

## 2012-04-16 NOTE — ED Notes (Signed)
Pt reports bilateral leg pain over the past couple of days. States she does a lot of walking at her job and standing her her feet. Denies any injury to the area.

## 2012-04-20 ENCOUNTER — Encounter (HOSPITAL_COMMUNITY): Payer: Self-pay | Admitting: *Deleted

## 2012-04-20 ENCOUNTER — Emergency Department (HOSPITAL_COMMUNITY): Payer: No Typology Code available for payment source

## 2012-04-20 ENCOUNTER — Emergency Department (HOSPITAL_COMMUNITY)
Admission: EM | Admit: 2012-04-20 | Discharge: 2012-04-20 | Disposition: A | Discharge status: Home / Self Care | Payer: No Typology Code available for payment source | Attending: Emergency Medicine | Admitting: Emergency Medicine

## 2012-04-20 DIAGNOSIS — R1011 Right upper quadrant pain: Secondary | ICD-10-CM

## 2012-04-20 DIAGNOSIS — K8020 Calculus of gallbladder without cholecystitis without obstruction: Secondary | ICD-10-CM

## 2012-04-20 DIAGNOSIS — E669 Obesity, unspecified: Secondary | ICD-10-CM | POA: Diagnosis present

## 2012-04-20 DIAGNOSIS — R109 Unspecified abdominal pain: Secondary | ICD-10-CM | POA: Insufficient documentation

## 2012-04-20 DIAGNOSIS — Z8739 Personal history of other diseases of the musculoskeletal system and connective tissue: Secondary | ICD-10-CM | POA: Insufficient documentation

## 2012-04-20 DIAGNOSIS — R112 Nausea with vomiting, unspecified: Secondary | ICD-10-CM | POA: Insufficient documentation

## 2012-04-20 DIAGNOSIS — R079 Chest pain, unspecified: Secondary | ICD-10-CM | POA: Insufficient documentation

## 2012-04-20 DIAGNOSIS — I10 Essential (primary) hypertension: Secondary | ICD-10-CM | POA: Insufficient documentation

## 2012-04-20 HISTORY — DX: Essential (primary) hypertension: I10

## 2012-04-20 HISTORY — DX: Calculus of gallbladder without cholecystitis without obstruction: K80.20

## 2012-04-20 LAB — CBC WITH DIFFERENTIAL/PLATELET
Basophils Relative: 1 % (ref 0–1)
HCT: 40.5 % (ref 36.0–46.0)
HCT: 39.1 % (ref 36.0–46.0)
Hemoglobin: 14.4 g/dL (ref 12.0–15.0)
Hemoglobin: 14 g/dL (ref 12.0–15.0)
Lymphocytes Relative: 52 % — ABNORMAL HIGH (ref 12–46)
Lymphs Abs: 3.5 10*3/uL (ref 0.7–4.0)
Lymphs Abs: 3 10*3/uL (ref 0.7–4.0)
MCHC: 35.6 g/dL (ref 30.0–36.0)
MCHC: 35.8 g/dL (ref 30.0–36.0)
Monocytes Absolute: 0.9 10*3/uL (ref 0.1–1.0)
Monocytes Absolute: 0.8 10*3/uL (ref 0.1–1.0)
Monocytes Relative: 13 % — ABNORMAL HIGH (ref 3–12)
Monocytes Relative: 12 % (ref 3–12)
Neutro Abs: 2.1 10*3/uL (ref 1.7–7.7)
Neutro Abs: 2.2 10*3/uL (ref 1.7–7.7)
Neutrophils Relative %: 37 % — ABNORMAL LOW (ref 43–77)
RBC: 4.15 MIL/uL (ref 3.87–5.11)
RBC: 3.95 MIL/uL (ref 3.87–5.11)
WBC: 6.8 10*3/uL (ref 4.0–10.5)

## 2012-04-20 LAB — COMPREHENSIVE METABOLIC PANEL
ALT: 71 U/L — ABNORMAL HIGH (ref 0–35)
AST: 120 U/L — ABNORMAL HIGH (ref 0–37)
Alkaline Phosphatase: 174 U/L — ABNORMAL HIGH (ref 39–117)
BUN: 12 mg/dL (ref 6–23)
CO2: 25 mEq/L (ref 19–32)
Calcium: 8.5 mg/dL (ref 8.4–10.5)
Chloride: 107 mEq/L (ref 96–112)
Creatinine, Ser: 0.65 mg/dL (ref 0.50–1.10)
Creatinine, Ser: 0.59 mg/dL (ref 0.50–1.10)
GFR calc Af Amer: >90 mL/min (ref >90)
GFR calc non Af Amer: >90 mL/min (ref >90)
GFR calc non Af Amer: >90 mL/min (ref >90)
Glucose, Bld: 119 mg/dL — ABNORMAL HIGH (ref 70–99)
Potassium: 4 mEq/L (ref 3.5–5.1)
Sodium: 137 mEq/L (ref 135–145)
Total Bilirubin: 0.9 mg/dL (ref 0.3–1.2)
Total Protein: 7.3 g/dL (ref 6.0–8.3)

## 2012-04-20 LAB — LIPASE, BLOOD: Lipase: 27 U/L (ref 11–59)

## 2012-04-20 MED ORDER — SODIUM CHLORIDE 0.9 % IV SOLN
1000.0000 mL | Freq: Once | INTRAVENOUS | Status: AC
  Administered 2012-04-20: 1000 mL via INTRAVENOUS

## 2012-04-20 MED ORDER — SODIUM CHLORIDE 0.9 % IV SOLN
1000.0000 mL | INTRAVENOUS | Status: DC
  Administered 2012-04-20: 1000 mL via INTRAVENOUS

## 2012-04-20 MED ORDER — HYDROMORPHONE HCL PF 1 MG/ML IJ SOLN
1.0000 mg | INTRAMUSCULAR | Status: DC | PRN
  Administered 2012-04-20 (×2): 1 mg via INTRAVENOUS
  Filled 2012-04-20 (×2): qty 1

## 2012-04-20 MED ORDER — ONDANSETRON HCL 4 MG/2ML IJ SOLN
4.0000 mg | Freq: Once | INTRAMUSCULAR | Status: AC
  Administered 2012-04-20: 4 mg via INTRAVENOUS
  Filled 2012-04-20: qty 2

## 2012-04-20 MED ORDER — OXYCODONE-ACETAMINOPHEN 5-325 MG PO TABS: 1.0000 | ORAL_TABLET | ORAL | Status: DC | PRN

## 2012-04-20 NOTE — ED Notes (Signed)
Pt to ER from shelter via EMS; pt c/o abd pain that began this afternoon; pt states that she has a history of gallstones and thinks this is the same thing; pt c/o nausea no vomiting.

## 2012-04-20 NOTE — ED Provider Notes (Signed)
History     CSN: [id number]  Arrival date & time 04/20/12  0109   First MD Initiated Contact with Patient 04/20/12 0448      Chief Complaint  Patient presents with  . Abdominal Pain     Patient is a 47 y.o. female presenting with abdominal pain. The history is provided by the patient.  Abdominal Pain Pain location:  Epigastric Pain radiates to:  Epigastric region and RUQ Pain severity:  Severe Onset quality:  Gradual Duration:  4 hours Timing:  Constant Context: eating   Relieved by:  Nothing Worsened by:  Nothing tried Associated symptoms: chest pain, nausea and vomiting   Associated symptoms: no fever   Pt had hot dogs for dinner last night. Pt was diagnosed with gallstones in December.  She did not follow up with a surgeon as instructed  She thought the stone would pass.  Past Medical History  Diagnosis Date  . Arthritis   . Hypertension   . Gallstones     Past Surgical History  Procedure Laterality Date  . Ankle surgery    . Carpal tunnel release    . Ankle surgery    . Cesarean section      Family History  Problem Relation Age of Onset  . Cancer Mother   . Rheum arthritis Father   . Diabetes Other     History  Substance Use Topics  . Smoking status: Current Every Day Smoker -- 0.50 packs/day  . Smokeless tobacco: Not on file  . Alcohol Use: Yes     Comment: occ    OB History   Grav Para Term Preterm Abortions TAB SAB Ect Mult Living                  Review of Systems  Constitutional: Negative for fever.  Cardiovascular: Positive for chest pain.  Gastrointestinal: Positive for nausea, vomiting and abdominal pain.  All other systems reviewed and are negative.    Allergies  Review of patient's allergies indicates no known allergies.  Home Medications   Current Outpatient Rx  Name  Route  Sig  Dispense  Refill  . ibuprofen (ADVIL,MOTRIN) 800 MG tablet   Oral   Take 800 mg by mouth every 8 (eight) hours as needed for pain.          Marland Kitchen oxyCODONE-acetaminophen (PERCOCET/ROXICET) 5-325 MG per tablet   Oral   Take 1-2 tablets by mouth every 4 (four) hours as needed for pain.   20 tablet   0     BP 110/64  Pulse 85  Temp(Src) 97.9 F (36.6 C) (Oral)  Resp 18  SpO2 95%  Physical Exam  Nursing note and vitals reviewed. Constitutional: She appears well-developed and well-nourished. No distress.  HENT:  Head: Normocephalic and atraumatic.  Right Ear: External ear normal.  Left Ear: External ear normal.  Eyes: Conjunctivae are normal. Right eye exhibits no discharge. Left eye exhibits no discharge. No scleral icterus.  Neck: Neck supple. No tracheal deviation present.  Cardiovascular: Normal rate, regular rhythm and intact distal pulses.   Pulmonary/Chest: Effort normal and breath sounds normal. No stridor. No respiratory distress. She has no wheezes. She has no rales.  Abdominal: Soft. Bowel sounds are normal. She exhibits no distension. There is tenderness in the right upper quadrant and epigastric area. There is guarding. There is no rigidity and no rebound.  Musculoskeletal: She exhibits no edema and no tenderness.  Neurological: She is alert. She has normal strength. No sensory  deficit. Cranial nerve deficit:  no gross defecits noted. She exhibits normal muscle tone. She displays no seizure activity. Coordination normal.  Skin: Skin is warm and dry. No rash noted.  Psychiatric: She has a normal mood and affect.    ED Course  Procedures (including critical care time) Medications  ondansetron (ZOFRAN) injection 4 mg (4 mg Intravenous Given 04/20/12 0529)  0.9 %  sodium chloride infusion (0 mLs Intravenous Stopped 04/20/12 0653)   4:25 AM patient states she's feeling better. However, on abdominal exam she still significantly tender in epigastrium and right upper quadrant  Labs Reviewed  CBC WITH DIFFERENTIAL - Abnormal; Notable for the following:    MCH 34.7 (*)    Neutrophils Relative 32 (*)    Lymphocytes  Relative 52 (*)    Monocytes Relative 13 (*)    All other components within normal limits  COMPREHENSIVE METABOLIC PANEL - Abnormal; Notable for the following:    Glucose, Bld 110 (*)    Albumin 2.5 (*)    AST 118 (*)    ALT 72 (*)    Alkaline Phosphatase 174 (*)    All other components within normal limits  COMPREHENSIVE METABOLIC PANEL - Abnormal; Notable for the following:    Glucose, Bld 119 (*)    Albumin 2.4 (*)    AST 120 (*)    ALT 71 (*)    Alkaline Phosphatase 168 (*)    All other components within normal limits  CBC WITH DIFFERENTIAL - Abnormal; Notable for the following:    MCH 35.4 (*)    Neutrophils Relative 37 (*)    Lymphocytes Relative 49 (*)    All other components within normal limits  LIPASE, BLOOD   US Abdomen Complete  04/20/2012  *RADIOLOGY REPORT*  Clinical Data:  Abdominal pain.  ABDOMINAL ULTRASOUND COMPLETE  Comparison:  Abdominal ultrasound performed 01/02/2012  Findings:  Gallbladder:  A few stones are again seen layering dependently within the gallbladder, without evidence for obstruction or cholecystitis.  The gallbladder wall remains normal in thickness; no pericholecystic fluid is seen.  No ultrasonographic Murphy's sign is elicited.  Common Bile Duct:  0.6 cm in diameter; within normal limits in caliber.  Liver:  Mildly increased parenchymal echogenicity and coarsened echotexture may reflect mild fatty infiltration; no focal lesions identified.  The liver may be mildly nodular in contour, raising question for mild cirrhotic change.  Limited Doppler evaluation demonstrates normal blood flow within the liver.  IVC:  Unremarkable in appearance.  Pancreas:  Although the pancreas is difficult to visualize in its entirety due to overlying bowel gas, no focal pancreatic abnormality is identified.  Spleen:  10.0 cm in length; within normal limits in size and echotexture.  Right kidney:  11.5 cm in length; normal in size, configuration and parenchymal echogenicity.   No evidence of mass or hydronephrosis.  Left kidney:  10.7 cm in length; normal in size, configuration and parenchymal echogenicity.  No evidence of mass or hydronephrosis.  Abdominal Aorta:  Not well characterized due to overlying bowel gas and the patient's habitus.  IMPRESSION:  1.  Cholelithiasis again noted; no evidence for obstruction or cholecystitis. 2.  Likely mild fatty infiltration within the liver.  The mildly nodular contour of the liver raises question for mild cirrhotic change.   Original Report Authenticated By: Tonia Ghent, M.D.      1. Cholelithiasis       MDM  The patient is having persistent biliary colic. Will consult with the general  surgery service regarding possible cholecystectomy        Celene Kras, MD 04/21/12 [id number]

## 2012-04-20 NOTE — Consult Note (Signed)
Linda Vaughn Jun 03, 1965  [id number].    Requesting MD: Pascal Lux [facility] Chief Complaint/Reason for Consult: RUQ abdominal pain, nausea and vomiting, cholelithiasis HPI:  47 y/o female PMH obesity, arthritis, HTN presents to [facility] for RUQ abdominal pain since last night after eating 2 hot dogs for dinner.  She also notes she vomited 2 times.  Pain is achy which comes and goes and has been relieved since since given pain medication her at the hospital.  Pt notes this happened one other time about a year ago after eating a hot dog then as well.  Pt denies any fever/chills, change in bowel/bladder habits, no sick contacts.  Pt notes she just started a job at the [employer] team and she doesn't want to have surgery because she can't miss work.  She's currently living in a shelter and trying to save money.     Family History  Problem Relation Age of Onset  . Cancer Mother   . Rheum arthritis Father   . Diabetes Other     Past Medical History  Diagnosis Date  . Arthritis   . Hypertension   . Gallstones     Past Surgical History  Procedure Laterality Date  . Ankle surgery    . Carpal tunnel release    . Ankle surgery    . Cesarean section      Social History:  reports that she has been smoking.  She does not have any smokeless tobacco history on file. She reports that  drinks alcohol. She reports that she does not use illicit drugs. smokes 1ppd over 3 weeks  Allergies: No Known Allergies   (Not in a hospital admission)  Blood pressure 119/74, pulse 86, temperature 97.7 F (36.5 C), temperature source Oral, resp. rate 18, SpO2 95.00%. Physical Exam: General: pleasant, WD/WN AA female who is laying in bed in NAD HEENT: head is normocephalic, atraumatic.  Sclera are noninjected.  PERRL.  Ears and nose without any masses or lesions.  Mouth is pink and moist Heart: regular, rate, and rhythm.  No obvious murmurs, gallops, or rubs noted.  Palpable pedal pulses  bilaterally Lungs: CTAB, no wheezes, rhonchi, or rales noted.  Respiratory effort nonlabored Abd: soft, ND, mild tenderness in RUQ and epigastrium, +BS, no masses, hernias, or organomegaly, low horizontal pelvic scar well healed MS: all 4 extremities are symmetrical with no cyanosis, clubbing, or edema. Skin: warm and dry with no masses, lesions, or rashes Psych: A&Ox3 with an appropriate affect.  Results for orders placed during the hospital encounter of 04/20/12 (from the past 48 hour(s))  CBC WITH DIFFERENTIAL     Status: Abnormal   Collection Time    04/20/12  2:41 AM      Result Value Range   WBC 6.8  4.0 - 10.5 K/uL   RBC 4.15  3.87 - 5.11 MIL/uL   Hemoglobin 14.4  12.0 - 15.0 g/dL   HCT 40.9  81.1 - 91.4 %   MCV 97.6  78.0 - 100.0 fL   MCH 34.7 (*) 26.0 - 34.0 pg   MCHC 35.6  30.0 - 36.0 g/dL   RDW 78.2  95.6 - 21.3 %   Platelets 169  150 - 400 K/uL   Neutrophils Relative 32 (*) 43 - 77 %   Neutro Abs 2.1  1.7 - 7.7 K/uL   Lymphocytes Relative 52 (*) 12 - 46 %   Lymphs Abs 3.5  0.7 - 4.0 K/uL   Monocytes Relative 13 (*)  3 - 12 %   Monocytes Absolute 0.9  0.1 - 1.0 K/uL   Eosinophils Relative 3  0 - 5 %   Eosinophils Absolute 0.2  0.0 - 0.7 K/uL   Basophils Relative 1  0 - 1 %   Basophils Absolute 0.0  0.0 - 0.1 K/uL  COMPREHENSIVE METABOLIC PANEL     Status: Abnormal   Collection Time    04/20/12  2:41 AM      Result Value Range   Sodium 139  135 - 145 mEq/L   Potassium 4.0  3.5 - 5.1 mEq/L   Chloride 107  96 - 112 mEq/L   CO2 25  19 - 32 mEq/L   Glucose, Bld 110 (*) 70 - 99 mg/dL   BUN 12  6 - 23 mg/dL   Creatinine, Ser 1.61  0.50 - 1.10 mg/dL   Calcium 8.4  8.4 - 09.6 mg/dL   Total Protein 7.7  6.0 - 8.3 g/dL   Albumin 2.5 (*) 3.5 - 5.2 g/dL   AST 045 (*) 0 - 37 U/L   ALT 72 (*) 0 - 35 U/L   Alkaline Phosphatase 174 (*) 39 - 117 U/L   Total Bilirubin 0.9  0.3 - 1.2 mg/dL   GFR calc non Af Amer >90  >90 mL/min   GFR calc Af Amer >90  >90 mL/min   Comment:             The eGFR has been calculated     using the CKD EPI equation.     This calculation has not been     validated in all clinical     situations.     eGFR's persistently     <90 mL/min signify     possible Chronic Kidney Disease.  LIPASE, BLOOD     Status: None   Collection Time    04/20/12  2:41 AM      Result Value Range   Lipase 27  11 - 59 U/L  COMPREHENSIVE METABOLIC PANEL     Status: Abnormal   Collection Time    04/20/12  5:26 AM      Result Value Range   Sodium 137  135 - 145 mEq/L   Potassium 4.0  3.5 - 5.1 mEq/L   Chloride 105  96 - 112 mEq/L   CO2 24  19 - 32 mEq/L   Glucose, Bld 119 (*) 70 - 99 mg/dL   BUN 11  6 - 23 mg/dL   Creatinine, Ser 4.09  0.50 - 1.10 mg/dL   Calcium 8.5  8.4 - 81.1 mg/dL   Total Protein 7.3  6.0 - 8.3 g/dL   Albumin 2.4 (*) 3.5 - 5.2 g/dL   AST 914 (*) 0 - 37 U/L   ALT 71 (*) 0 - 35 U/L   Alkaline Phosphatase 168 (*) 39 - 117 U/L   Total Bilirubin 1.0  0.3 - 1.2 mg/dL   GFR calc non Af Amer >90  >90 mL/min   GFR calc Af Amer >90  >90 mL/min   Comment:            The eGFR has been calculated     using the CKD EPI equation.     This calculation has not been     validated in all clinical     situations.     eGFR's persistently     <90 mL/min signify     possible Chronic Kidney Disease.  CBC WITH DIFFERENTIAL  Status: Abnormal   Collection Time    04/20/12  5:26 AM      Result Value Range   WBC 6.1  4.0 - 10.5 K/uL   RBC 3.95  3.87 - 5.11 MIL/uL   Hemoglobin 14.0  12.0 - 15.0 g/dL   HCT 96.2  95.2 - 84.1 %   MCV 99.0  78.0 - 100.0 fL   MCH 35.4 (*) 26.0 - 34.0 pg   MCHC 35.8  30.0 - 36.0 g/dL   RDW 32.4  40.1 - 02.7 %   Platelets 156  150 - 400 K/uL   Neutrophils Relative 37 (*) 43 - 77 %   Neutro Abs 2.2  1.7 - 7.7 K/uL   Lymphocytes Relative 49 (*) 12 - 46 %   Lymphs Abs 3.0  0.7 - 4.0 K/uL   Monocytes Relative 12  3 - 12 %   Monocytes Absolute 0.8  0.1 - 1.0 K/uL   Eosinophils Relative 2  0 - 5 %   Eosinophils  Absolute 0.1  0.0 - 0.7 K/uL   Basophils Relative 1  0 - 1 %   Basophils Absolute 0.0  0.0 - 0.1 K/uL   US Abdomen Complete  04/20/2012  *RADIOLOGY REPORT*  Clinical Data:  Abdominal pain.  ABDOMINAL ULTRASOUND COMPLETE  Comparison:  Abdominal ultrasound performed 01/02/2012  Findings:  Gallbladder:  A few stones are again seen layering dependently within the gallbladder, without evidence for obstruction or cholecystitis.  The gallbladder wall remains normal in thickness; no pericholecystic fluid is seen.  No ultrasonographic Murphy's sign is elicited.  Common Bile Duct:  0.6 cm in diameter; within normal limits in caliber.  Liver:  Mildly increased parenchymal echogenicity and coarsened echotexture may reflect mild fatty infiltration; no focal lesions identified.  The liver may be mildly nodular in contour, raising question for mild cirrhotic change.  Limited Doppler evaluation demonstrates normal blood flow within the liver.  IVC:  Unremarkable in appearance.  Pancreas:  Although the pancreas is difficult to visualize in its entirety due to overlying bowel gas, no focal pancreatic abnormality is identified.  Spleen:  10.0 cm in length; within normal limits in size and echotexture.  Right kidney:  11.5 cm in length; normal in size, configuration and parenchymal echogenicity.  No evidence of mass or hydronephrosis.  Left kidney:  10.7 cm in length; normal in size, configuration and parenchymal echogenicity.  No evidence of mass or hydronephrosis.  Abdominal Aorta:  Not well characterized due to overlying bowel gas and the patient's habitus.  IMPRESSION:  1.  Cholelithiasis again noted; no evidence for obstruction or cholecystitis. 2.  Likely mild fatty infiltration within the liver.  The mildly nodular contour of the liver raises question for mild cirrhotic change.   Original Report Authenticated By: Tonia Ghent, M.D.        Assessment/Plan Cholelithiasis, exam consistent with biliary colic, no evidence  of true cholecystitis 1.  Offered patient surgery today, but she refused due to trying to keep her new job 2.  She agrees to f/u in the office once she is able to establish insurance or save money for the deposit 3.  Would offer her pain medication and diet modification and recommend she see a PCP upon discharge  H/O HTN not currently on meds Obesity BMI >30     DORT, Moria Brophy 04/20/2012, 8:09 AM Pager: [phone]

## 2012-04-25 NOTE — ED Provider Notes (Signed)
Medical screening examination/treatment/procedure(s) were performed by non-physician practitioner and as supervising physician I was immediately available for consultation/collaboration.  Flint Melter, MD 04/25/12 2133

## 2012-04-28 ENCOUNTER — Emergency Department (HOSPITAL_COMMUNITY)
Admission: EM | Admit: 2012-04-28 | Discharge: 2012-04-28 | Disposition: A | Discharge status: Home / Self Care | Payer: No Typology Code available for payment source | Attending: Emergency Medicine | Admitting: Emergency Medicine

## 2012-04-28 ENCOUNTER — Encounter (HOSPITAL_COMMUNITY): Payer: Self-pay | Admitting: *Deleted

## 2012-04-28 ENCOUNTER — Emergency Department (HOSPITAL_COMMUNITY): Payer: No Typology Code available for payment source

## 2012-04-28 DIAGNOSIS — M1710 Unilateral primary osteoarthritis, unspecified knee: Secondary | ICD-10-CM | POA: Insufficient documentation

## 2012-04-28 DIAGNOSIS — R05 Cough: Secondary | ICD-10-CM

## 2012-04-28 DIAGNOSIS — M7989 Other specified soft tissue disorders: Secondary | ICD-10-CM | POA: Insufficient documentation

## 2012-04-28 DIAGNOSIS — IMO0002 Reserved for concepts with insufficient information to code with codable children: Secondary | ICD-10-CM | POA: Insufficient documentation

## 2012-04-28 DIAGNOSIS — R059 Cough, unspecified: Secondary | ICD-10-CM | POA: Insufficient documentation

## 2012-04-28 DIAGNOSIS — Z8719 Personal history of other diseases of the digestive system: Secondary | ICD-10-CM | POA: Insufficient documentation

## 2012-04-28 DIAGNOSIS — I10 Essential (primary) hypertension: Secondary | ICD-10-CM | POA: Insufficient documentation

## 2012-04-28 DIAGNOSIS — M79606 Pain in leg, unspecified: Secondary | ICD-10-CM

## 2012-04-28 DIAGNOSIS — M1990 Unspecified osteoarthritis, unspecified site: Secondary | ICD-10-CM

## 2012-04-28 DIAGNOSIS — F17200 Nicotine dependence, unspecified, uncomplicated: Secondary | ICD-10-CM | POA: Insufficient documentation

## 2012-04-28 LAB — BASIC METABOLIC PANEL
BUN: 12 mg/dL (ref 6–23)
CO2: 24 mEq/L (ref 19–32)
Chloride: 105 mEq/L (ref 96–112)
Creatinine, Ser: 0.68 mg/dL (ref 0.50–1.10)
GFR calc Af Amer: >90 mL/min (ref >90)
Glucose, Bld: 85 mg/dL (ref 70–99)
Potassium: 3.6 mEq/L (ref 3.5–5.1)

## 2012-04-28 LAB — CBC
HCT: 38.6 % (ref 36.0–46.0)
Hemoglobin: 13.6 g/dL (ref 12.0–15.0)
MCH: 34.4 pg — ABNORMAL HIGH (ref 26.0–34.0)
MCHC: 35.2 g/dL (ref 30.0–36.0)
MCV: 97.7 fL (ref 78.0–100.0)
RDW: 13.4 % (ref 11.5–15.5)

## 2012-04-28 MED ORDER — OXYCODONE-ACETAMINOPHEN 5-325 MG PO TABS
1.0000 | ORAL_TABLET | Freq: Once | ORAL | Status: AC
  Administered 2012-04-28: 1 via ORAL
  Filled 2012-04-28: qty 1

## 2012-04-28 MED ORDER — HYDROCODONE-ACETAMINOPHEN 5-325 MG PO TABS: 1.0000 | ORAL_TABLET | Freq: Four times a day (QID) | ORAL | Status: DC | PRN

## 2012-04-28 NOTE — ED Notes (Signed)
Pt to xray

## 2012-04-28 NOTE — ED Provider Notes (Signed)
History     CSN: [id number]  Arrival date & time 04/28/12  1046   First MD Initiated Contact with Patient 04/28/12 1131      Chief Complaint  Patient presents with  . Leg Pain  . Leg Swelling  . Cough    (Consider location/radiation/quality/duration/timing/severity/associated sxs/prior treatment) HPI Comments: Linda Vaughn is a 47 y.o. female w hx of HTN and OA present to the er c/o bilateral leg pain, swelling adn coughing. She reports chronic knee arthritis but the addition of swelling is causing worsened pain. Pain is worse in the AM  (stiffness lasting hours) and any time she tries to move after being still. Swelling is from knees to ankles and pain is primarily posteriorly. Pain is rated at 10/10 in severity. Crepitus is described w ambulation. Pain unrelieved by NSAIDs. In addition pt reports 2-3 days of cough that is non productive and described as a tickling in the back of the throat. She denies any SOB, CP, fevers night, sweats chills, DVT hx, CA, recent travel, estrogen use, hemoptysis, PND, orthopnea.   Patient is a 47 y.o. female presenting with cough. The history is provided by the patient.  Cough   Past Medical History  Diagnosis Date  . Arthritis   . Hypertension   . Gallstones     Past Surgical History  Procedure Laterality Date  . Ankle surgery    . Carpal tunnel release    . Ankle surgery    . Cesarean section      Family History  Problem Relation Age of Onset  . Cancer Mother   . Rheum arthritis Father   . Diabetes Other     History  Substance Use Topics  . Smoking status: Current Every Day Smoker -- 0.50 packs/day  . Smokeless tobacco: Never Used  . Alcohol Use: Yes     Comment: occ    OB History   Grav Para Term Preterm Abortions TAB SAB Ect Mult Living                  Review of Systems  Respiratory: Positive for cough.   All other systems reviewed and are negative.    Allergies  Review of patient's allergies indicates no  known allergies.  Home Medications   Current Outpatient Rx  Name  Route  Sig  Dispense  Refill  . HYDROcodone-acetaminophen (VICODIN) 5-500 MG per tablet   Oral   Take 1 tablet by mouth every 6 (six) hours as needed for pain (pain).         Marland Kitchen ibuprofen (ADVIL,MOTRIN) 800 MG tablet   Oral   Take 800 mg by mouth every 8 (eight) hours as needed for pain.         Marland Kitchen oxyCODONE-acetaminophen (PERCOCET/ROXICET) 5-325 MG per tablet   Oral   Take 1-2 tablets by mouth every 4 (four) hours as needed for pain.   20 tablet   0     BP 140/95  Pulse 92  Temp(Src) 97.9 F (36.6 C) (Oral)  Resp 18  SpO2 100%  Physical Exam  Nursing note and vitals reviewed. Constitutional: She appears well-developed and well-nourished. No distress.  HENT:  Head: Normocephalic and atraumatic.  Eyes: Conjunctivae and EOM are normal.  Neck: Normal range of motion. Neck supple.  Cardiovascular:  Intact distal pulses, capillary refill < 3 seconds  Musculoskeletal:  Pain w knee ROM, lower extremities ttp. All other extremities with normal ROM  Neurological:  No sensory deficit  Skin:  She is not diaphoretic.  Skin intact, no tenting    ED Course  Procedures (including critical care time)  Labs Reviewed  BASIC METABOLIC PANEL  CBC   No results found.   No diagnosis found.    MDM  Patient is a 47 year old female who presents emergency department complaining of bilateral leg swelling, leg pain and morning stiffness.  Patient has been evaluated for similar twice this month and advised to followup with orthopedics.  Low suspicion for acute etiology or vascular emergency as patient is with good pulses, range of motion and bilateral ultrasound earlier this month was without DVT.  In addition patient reports nonproductive cough for which chest x-ray was ordered with no acute findings.  Resource guide given for followup. At this time there does not appear to be any evidence of an acute emergency  medical condition and the patient appears stable for discharge with appropriate outpatient follow up.Diagnosis was discussed with patient who verbalizes understanding and is agreeable to discharge.       Jaci Carrel, [place] 04/28/12 1301

## 2012-04-28 NOTE — ED Notes (Signed)
PA at bedside Pt alert and oriented x4. Respirations even and unlabored, bilateral symmetrical rise and fall of chest. Skin warm and dry. In no acute distress. Denies needs.   

## 2012-04-28 NOTE — ED Provider Notes (Signed)
Medical screening examination/treatment/procedure(s) were performed by non-physician practitioner and as supervising physician I was immediately available for consultation/collaboration.   Lyanne Co, MD 04/28/12 [phone]

## 2012-04-28 NOTE — ED Notes (Signed)
Pt alert and oriented x4. Respirations even and unlabored, bilateral symmetrical rise and fall of chest. Skin warm and dry. In no acute distress. Denies needs.   

## 2012-04-28 NOTE — ED Notes (Signed)
Pt from home with reports of bilateral leg swelling/pain and a cough that has gotten worse over the last 3 weeks. Pt reports that she has been diagnosed with osteoarthritis but feels that something else is going on.

## 2012-06-30 ENCOUNTER — Encounter (HOSPITAL_COMMUNITY): Payer: Self-pay | Admitting: Emergency Medicine

## 2012-06-30 ENCOUNTER — Emergency Department (HOSPITAL_COMMUNITY)
Admission: EM | Admit: 2012-06-30 | Discharge: 2012-06-30 | Disposition: A | Discharge status: Home / Self Care | Payer: Self-pay | Attending: Emergency Medicine | Admitting: Emergency Medicine

## 2012-06-30 DIAGNOSIS — F17200 Nicotine dependence, unspecified, uncomplicated: Secondary | ICD-10-CM | POA: Insufficient documentation

## 2012-06-30 DIAGNOSIS — L299 Pruritus, unspecified: Secondary | ICD-10-CM | POA: Insufficient documentation

## 2012-06-30 DIAGNOSIS — I10 Essential (primary) hypertension: Secondary | ICD-10-CM | POA: Insufficient documentation

## 2012-06-30 DIAGNOSIS — B356 Tinea cruris: Secondary | ICD-10-CM | POA: Insufficient documentation

## 2012-06-30 DIAGNOSIS — B369 Superficial mycosis, unspecified: Secondary | ICD-10-CM

## 2012-06-30 DIAGNOSIS — Z8739 Personal history of other diseases of the musculoskeletal system and connective tissue: Secondary | ICD-10-CM | POA: Insufficient documentation

## 2012-06-30 DIAGNOSIS — Z8719 Personal history of other diseases of the digestive system: Secondary | ICD-10-CM | POA: Insufficient documentation

## 2012-06-30 MED ORDER — CLOTRIMAZOLE 1 % EX CREA: TOPICAL_CREAM | CUTANEOUS | Status: DC

## 2012-06-30 NOTE — ED Notes (Addendum)
Attempted to call pt to provide discharge instructions over the phone and was told it was a wrong number for pt.

## 2012-06-30 NOTE — ED Notes (Signed)
PA asked for me to contact social work.  Patient expressed need for financial help getting prescription.

## 2012-06-30 NOTE — ED Provider Notes (Signed)
History    CSN: [id number] Arrival date & time 06/30/12  1050  First MD Initiated Contact with Patient 06/30/12 1111     Chief Complaint  Patient presents with  . Rash   (Consider location/radiation/quality/duration/timing/severity/associated sxs/prior Treatment) HPI Comments: 47 y.o. Morbidly obese female with PMHx of hypertension presents today complaining of a rash she has had about a week. This is a new problem.  States she used Gain detergent two weeks ago, but rash did not appear immediately after, appeared about a week after and localized to the groin area. Mildly pruritic. Not erythematous. Pt tried putting "some lotion" on it (she does not know what) but it did not help. Not painful, but mildly uncomfortable. It remains unchanged since it first appeared.   Patient is a 47 y.o. female presenting with rash.  Rash Associated symptoms: no chest pain, no constipation, no diarrhea, no dysuria, no fever, no nausea, no shortness of breath and no vomiting    Past Medical History  Diagnosis Date  . Arthritis   . Hypertension   . Gallstones    Past Surgical History  Procedure Laterality Date  . Ankle surgery    . Carpal tunnel release    . Ankle surgery    . Cesarean section     Family History  Problem Relation Age of Onset  . Cancer Mother   . Rheum arthritis Father   . Diabetes Other    History  Substance Use Topics  . Smoking status: Current Every Day Smoker -- 0.50 packs/day  . Smokeless tobacco: Never Used  . Alcohol Use: Yes     Comment: occ   OB History   Grav Para Term Preterm Abortions TAB SAB Ect Mult Living                 Review of Systems  Constitutional: Negative for fever and diaphoresis.  HENT: Negative for neck pain and neck stiffness.   Eyes: Negative for visual disturbance.  Respiratory: Negative for apnea, chest tightness and shortness of breath.   Cardiovascular: Negative for chest pain and palpitations.  Gastrointestinal: Negative for  nausea, vomiting, diarrhea and constipation.  Genitourinary: Negative for dysuria.  Musculoskeletal: Negative for gait problem.  Skin: Positive for rash.       Groin and under stomach  Neurological: Negative for dizziness, weakness, light-headedness, numbness and headaches.    Allergies  Review of patient's allergies indicates no known allergies.  Home Medications   Current Outpatient Rx  Name  Route  Sig  Dispense  Refill  . ibuprofen (ADVIL,MOTRIN) 200 MG tablet   Oral   Take 400 mg by mouth every 6 (six) hours as needed for pain.         . clotrimazole (LOTRIMIN) 1 % cream      Apply to affected area 2 times daily   15 g   0    BP 120/88  Pulse 84  Temp(Src) 98.3 F (36.8 C) (Oral)  SpO2 96% Physical Exam  Nursing note and vitals reviewed. Constitutional: She is oriented to person, place, and time. No distress.  Morbidly obese  HENT:  Head: Normocephalic and atraumatic.  Eyes: Conjunctivae and EOM are normal.  Neck: Normal range of motion. Neck supple.  No meningeal signs  Cardiovascular: Normal rate, regular rhythm and normal heart sounds.  Exam reveals no gallop and no friction rub.   No murmur heard. Pulmonary/Chest: Effort normal and breath sounds normal. No respiratory distress. She has no wheezes. She has  no rales. She exhibits no tenderness.  Abdominal: Soft. Bowel sounds are normal. She exhibits no distension. There is no tenderness. There is no rebound and no guarding.  Musculoskeletal: Normal range of motion. She exhibits no edema and no tenderness.  Neurological: She is alert and oriented to person, place, and time. No cranial nerve deficit.  Skin: Skin is warm and dry. She is not diaphoretic. No erythema.  White patchy rash, mildly pruritic, not erythematous,  spread to bilateral groin creases and pannus of abdomen.   Psychiatric:  argumentative     ED Course  Procedures (including critical care time) Labs Reviewed - No data to display No  results found. 1. Superficial fungal infection of skin    Discharge Medication List as of 06/30/2012 12:42 PM    START taking these medications   Details  clotrimazole (LOTRIMIN) 1 % cream Apply to affected area 2 times daily, Print         MDM  No evidence of SJS or necrotizing fasciitis. No blisters, no pustules, no warmth, no draining sinus tracts, no superficial abscesses, no bullous impetigo, no vesicles, no desquamation, no target lesions with dusky purpura or a central bulla. Not tender to touch. Does not resemble a contact dermatitis.  Gain detergent was used 2 weeks ago and only affected area is groin and pannus. Resembles fungal rash in the creases of the groin and under the pannus of the abdomen. Pt wanted me to "give her something here because she can't afford nothin." Social work consult obtained to help pt fill prescription. Discussed reasons to seek immediate care. Patient expresses understanding and agrees with plan.    Glade Nurse, PA-C 06/30/12 1651

## 2012-06-30 NOTE — ED Notes (Signed)
Pt reports 1 week hx of itchy rash to groin area after using gain detergent. States little relief with OTC meds. Pt alert, oriented x4, NAD.

## 2012-06-30 NOTE — ED Notes (Signed)
Went to go over discharge paperwork with patient and she was gone from her room.    Patient did not get her discharge instructions.   Will check room again in a few minutes to see if patient returns.

## 2012-07-01 NOTE — ED Provider Notes (Signed)
Medical screening examination/treatment/procedure(s) were performed by non-physician practitioner and as supervising physician I was immediately available for consultation/collaboration.  Olivia Mackie, MD 07/01/12 [phone]

## 2012-09-12 ENCOUNTER — Encounter (HOSPITAL_COMMUNITY): Payer: Self-pay | Admitting: *Deleted

## 2012-09-12 ENCOUNTER — Emergency Department (HOSPITAL_COMMUNITY): Payer: No Typology Code available for payment source

## 2012-09-12 ENCOUNTER — Emergency Department (HOSPITAL_COMMUNITY)
Admission: EM | Admit: 2012-09-12 | Discharge: 2012-09-12 | Disposition: A | Discharge status: Home / Self Care | Payer: No Typology Code available for payment source | Attending: Emergency Medicine | Admitting: Emergency Medicine

## 2012-09-12 DIAGNOSIS — Z8719 Personal history of other diseases of the digestive system: Secondary | ICD-10-CM | POA: Insufficient documentation

## 2012-09-12 DIAGNOSIS — S0993XA Unspecified injury of face, initial encounter: Secondary | ICD-10-CM | POA: Insufficient documentation

## 2012-09-12 DIAGNOSIS — Y9389 Activity, other specified: Secondary | ICD-10-CM | POA: Insufficient documentation

## 2012-09-12 DIAGNOSIS — M129 Arthropathy, unspecified: Secondary | ICD-10-CM | POA: Insufficient documentation

## 2012-09-12 DIAGNOSIS — Z79899 Other long term (current) drug therapy: Secondary | ICD-10-CM | POA: Insufficient documentation

## 2012-09-12 DIAGNOSIS — Y92410 Unspecified street and highway as the place of occurrence of the external cause: Secondary | ICD-10-CM | POA: Insufficient documentation

## 2012-09-12 DIAGNOSIS — M549 Dorsalgia, unspecified: Secondary | ICD-10-CM

## 2012-09-12 DIAGNOSIS — M542 Cervicalgia: Secondary | ICD-10-CM

## 2012-09-12 DIAGNOSIS — F17200 Nicotine dependence, unspecified, uncomplicated: Secondary | ICD-10-CM | POA: Insufficient documentation

## 2012-09-12 DIAGNOSIS — IMO0002 Reserved for concepts with insufficient information to code with codable children: Secondary | ICD-10-CM | POA: Insufficient documentation

## 2012-09-12 DIAGNOSIS — S0990XA Unspecified injury of head, initial encounter: Secondary | ICD-10-CM | POA: Insufficient documentation

## 2012-09-12 DIAGNOSIS — I10 Essential (primary) hypertension: Secondary | ICD-10-CM | POA: Insufficient documentation

## 2012-09-12 MED ORDER — DIAZEPAM 5 MG PO TABS: 5.0000 mg | ORAL_TABLET | Freq: Two times a day (BID) | ORAL | Status: DC

## 2012-09-12 MED ORDER — DIAZEPAM 5 MG PO TABS
10.0000 mg | ORAL_TABLET | Freq: Once | ORAL | Status: AC
  Administered 2012-09-12: 10 mg via ORAL
  Filled 2012-09-12: qty 2

## 2012-09-12 MED ORDER — IBUPROFEN 400 MG PO TABS: 400.0000 mg | ORAL_TABLET | Freq: Four times a day (QID) | ORAL | Status: DC | PRN

## 2012-09-12 MED ORDER — IBUPROFEN 400 MG PO TABS
800.0000 mg | ORAL_TABLET | Freq: Once | ORAL | Status: AC
  Administered 2012-09-12: 800 mg via ORAL
  Filled 2012-09-12: qty 2
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
## 2012-09-12 NOTE — ED Notes (Signed)
Called no answer

## 2012-09-12 NOTE — ED Notes (Signed)
No answer

## 2012-09-16 NOTE — ED Provider Notes (Signed)
Medical screening examination/treatment/procedure(s) were performed by non-physician practitioner and as supervising physician I was immediately available for consultation/collaboration.  Demitrus Francisco, MD 09/16/12 1149 

## 2013-12-11 ENCOUNTER — Emergency Department (HOSPITAL_COMMUNITY)
Admission: EM | Admit: 2013-12-11 | Discharge: 2013-12-11 | Disposition: A | Discharge status: Home / Self Care | Payer: No Typology Code available for payment source | Attending: Emergency Medicine | Admitting: Emergency Medicine

## 2013-12-11 ENCOUNTER — Encounter (HOSPITAL_COMMUNITY): Payer: Self-pay | Admitting: Emergency Medicine

## 2013-12-11 ENCOUNTER — Emergency Department (HOSPITAL_COMMUNITY): Payer: No Typology Code available for payment source

## 2013-12-11 DIAGNOSIS — J111 Influenza due to unidentified influenza virus with other respiratory manifestations: Secondary | ICD-10-CM

## 2013-12-11 DIAGNOSIS — Z008 Encounter for other general examination: Secondary | ICD-10-CM

## 2013-12-11 DIAGNOSIS — Z0000 Encounter for general adult medical examination without abnormal findings: Secondary | ICD-10-CM | POA: Insufficient documentation

## 2013-12-11 DIAGNOSIS — Z720 Tobacco use: Secondary | ICD-10-CM | POA: Insufficient documentation

## 2013-12-11 DIAGNOSIS — Z79899 Other long term (current) drug therapy: Secondary | ICD-10-CM | POA: Insufficient documentation

## 2013-12-11 DIAGNOSIS — Z8719 Personal history of other diseases of the digestive system: Secondary | ICD-10-CM | POA: Insufficient documentation

## 2013-12-11 DIAGNOSIS — R000 Tachycardia, unspecified: Secondary | ICD-10-CM | POA: Insufficient documentation

## 2013-12-11 DIAGNOSIS — I10 Essential (primary) hypertension: Secondary | ICD-10-CM | POA: Insufficient documentation

## 2013-12-11 DIAGNOSIS — M1990 Unspecified osteoarthritis, unspecified site: Secondary | ICD-10-CM | POA: Insufficient documentation

## 2013-12-11 MED ORDER — ALBUTEROL SULFATE (2.5 MG/3ML) 0.083% IN NEBU
5.0000 mg | INHALATION_SOLUTION | Freq: Once | RESPIRATORY_TRACT | Status: AC
  Administered 2013-12-11: 5 mg via RESPIRATORY_TRACT
  Filled 2013-12-11: qty 6

## 2013-12-11 MED ORDER — ALBUTEROL SULFATE HFA 108 (90 BASE) MCG/ACT IN AERS
2.0000 | INHALATION_SPRAY | RESPIRATORY_TRACT | Status: DC | PRN
  Administered 2013-12-11: 2 via RESPIRATORY_TRACT
  Filled 2013-12-11: qty 6.7

## 2013-12-11 MED ORDER — IPRATROPIUM BROMIDE 0.02 % IN SOLN
0.5000 mg | Freq: Once | RESPIRATORY_TRACT | Status: AC
  Administered 2013-12-11: 0.5 mg via RESPIRATORY_TRACT
  Filled 2013-12-11: qty 2.5

## 2013-12-11 MED ORDER — ACETAMINOPHEN 325 MG PO TABS
650.0000 mg | ORAL_TABLET | Freq: Four times a day (QID) | ORAL | Status: DC | PRN
  Filled 2013-12-11: qty 2

## 2013-12-11 MED ORDER — HYDROCODONE-HOMATROPINE 5-1.5 MG/5ML PO SYRP: 5.0000 mL | ORAL_SOLUTION | Freq: Four times a day (QID) | ORAL | Status: DC | PRN

## 2013-12-11 MED ORDER — SODIUM CHLORIDE 0.9 % IV BOLUS (SEPSIS)
1000.0000 mL | Freq: Once | INTRAVENOUS | Status: AC
  Administered 2013-12-11: 1000 mL via INTRAVENOUS

## 2013-12-11 MED ORDER — ACETAMINOPHEN 500 MG PO TABS
1000.0000 mg | ORAL_TABLET | Freq: Once | ORAL | Status: AC
  Administered 2013-12-11: 1000 mg via ORAL
  Filled 2013-12-11: qty 2

## 2013-12-11 NOTE — ED Notes (Signed)
Bed: [facility] Expected date:  Expected time:  Means of arrival:  Comments: EMS 

## 2013-12-11 NOTE — ED Notes (Signed)
Pt transported via EMS from home with c/o n/v/d, chills, fever, cough x 2 weeks.

## 2013-12-11 NOTE — Discharge Instructions (Signed)

## 2013-12-11 NOTE — ED Notes (Signed)
Patient complains of headache all over.

## 2013-12-11 NOTE — ED Provider Notes (Signed)
CSN: [id number]     Arrival date & time 12/11/13  2028 History   First MD Initiated Contact with Patient 12/11/13 2045     Chief Complaint  Patient presents with  . Influenza     (Consider location/radiation/quality/duration/timing/severity/associated sxs/prior Treatment) HPI Comments:  Patient presents emergency department with the chief complaint of fever, headache, generalized body aches. She states that the symptoms started approximately 2 weeks ago. She states that they started out as cough. She states that her symptoms progressively worsened, now she is complaining of associated nausea, vomiting, diarrhea, dear, chills. She has tried taking OTC medications with no relief. She states that the coughing keeps her from sleeping. She did not get a flu shot this year. She is unaware of any sick contacts.  The history is provided by the patient. No language interpreter was used.    Past Medical History  Diagnosis Date  . Arthritis   . Hypertension   . Gallstones    Past Surgical History  Procedure Laterality Date  . Ankle surgery    . Carpal tunnel release    . Ankle surgery    . Cesarean section     Family History  Problem Relation Age of Onset  . Cancer Mother   . Rheum arthritis Father   . Diabetes Other    History  Substance Use Topics  . Smoking status: Current Every Day Smoker -- 0.50 packs/day  . Smokeless tobacco: Never Used  . Alcohol Use: Yes     Comment: occ   OB History    No data available     Review of Systems  Constitutional: Positive for fever and chills.  HENT: Positive for postnasal drip, rhinorrhea, sinus pressure, sneezing and sore throat.   Respiratory: Positive for cough. Negative for shortness of breath.   Cardiovascular: Negative for chest pain.  Gastrointestinal: Positive for nausea, vomiting and diarrhea. Negative for abdominal pain and constipation.  Genitourinary: Negative for dysuria.  All other systems reviewed and are  negative.     Allergies  Review of patient's allergies indicates no known allergies.  Home Medications   Prior to Admission medications   Medication Sig Start Date End Date Taking? Authorizing Provider  diazepam (VALIUM) 5 MG tablet Take 1 tablet (5 mg total) by mouth 2 (two) times daily. 09/12/12   Junius FinnerErin O'Malley, PA-C  ibuprofen (ADVIL,MOTRIN) 400 MG tablet Take 1 tablet (400 mg total) by mouth every 6 (six) hours as needed for pain. 09/12/12   Junius FinnerErin O'Malley, PA-C  Multiple Vitamins-Minerals (MULTI-VITAMIN GUMMIES) CHEW Chew 1 tablet by mouth daily.    Historical Provider, MD   BP 131/98 mmHg  Pulse 114  Temp(Src) 101.4 F (38.6 C) (Oral)  Resp 18  Ht 5' (1.524 m)  Wt 220 lb (99.791 kg)  BMI 42.97 kg/m2  SpO2 96% Physical Exam  Constitutional: She is oriented to person, place, and time. She appears well-developed and well-nourished.   Coughing during exam  HENT:  Head: Normocephalic and atraumatic.  Eyes: Conjunctivae and EOM are normal. Pupils are equal, round, and reactive to light.  Neck: Normal range of motion. Neck supple.  Cardiovascular: Normal rate and regular rhythm.  Exam reveals no gallop and no friction rub.   No murmur heard.  Tachycardic, (coughing)  Pulmonary/Chest: Effort normal and breath sounds normal. No respiratory distress. She has no wheezes. She has no rales. She exhibits no tenderness.   Mild end expiratory wheezes and right lower lobe  Abdominal: Soft. Bowel sounds are normal.  She exhibits no distension and no mass. There is no tenderness. There is no rebound and no guarding.  No focal abdominal tenderness, no RLQ tenderness or pain at McBurney's point, no RUQ tenderness or Murphy's sign, no left-sided abdominal tenderness, no fluid wave, or signs of peritonitis   Musculoskeletal: Normal range of motion. She exhibits no edema or tenderness.  Neurological: She is alert and oriented to person, place, and time.  Skin: Skin is warm and dry.  Psychiatric:  She has a normal mood and affect. Her behavior is normal. Judgment and thought content normal.  Nursing note and vitals reviewed.   ED Course  Procedures (including critical care time) Labs Review Labs Reviewed - No data to display  Imaging Review Dg Chest 2 View  12/11/2013   CLINICAL DATA:  Flu-like symptoms. Headaches. Upper chest pain. Sore throat. Nausea, vomiting, diarrhea. Fever. Headache. Wheezing cough for 3 weeks. Current smoker.  EXAM: CHEST  2 VIEW  COMPARISON:  04/28/2012  FINDINGS: The heart size and mediastinal contours are within normal limits. Both lungs are clear. The visualized skeletal structures are unremarkable.  IMPRESSION: No active cardiopulmonary disease.   Electronically Signed   By: Burman NievesWilliam  Stevens M.D.   On: 12/11/2013 21:39     EKG Interpretation None      MDM   Final diagnoses:  Encounter for medical assessment  Influenza     patient with presumed influenza. She  Has been febrile, mildly tachycardic, coughing, and has generalized body aches. She has been controlling her symptoms with OTC medications. Will give fluids,  Check chest x-ray, and will reassess.   Patient feeling better after treatment is emergency department. Chest x-ray is negative for pneumonia. Patient does not have asthma, COPD, or any immunosuppressive disorders. I believe that she is appropriate for outpatient management. Patient given instructions on keeping well hydrated, getting enough rest , and fever control with Tylenol and ibuprofen. Also give her some cough medicine. Patient understands agrees with plan. She is stable and ready for discharge.  Filed Vitals:   12/11/13 2159  BP: 124/76  Pulse: 96  Temp: 98.7 F (37.1 C)  Resp: [address]     Roxy HorsemanRobert Meyer Arora, PA-C 12/11/13 2215  Purvis SheffieldForrest Harrison, MD 12/13/13 0001

## 2013-12-11 NOTE — ED Notes (Signed)
Notified RT about neb treatment 

## 2014-08-22 ENCOUNTER — Encounter (HOSPITAL_COMMUNITY): Payer: Self-pay | Admitting: *Deleted

## 2014-08-22 DIAGNOSIS — K7469 Other cirrhosis of liver: Secondary | ICD-10-CM | POA: Insufficient documentation

## 2014-08-22 DIAGNOSIS — Z720 Tobacco use: Secondary | ICD-10-CM | POA: Insufficient documentation

## 2014-08-22 DIAGNOSIS — M1990 Unspecified osteoarthritis, unspecified site: Secondary | ICD-10-CM | POA: Insufficient documentation

## 2014-08-22 DIAGNOSIS — R740 Nonspecific elevation of levels of transaminase and lactic acid dehydrogenase [LDH]: Secondary | ICD-10-CM | POA: Insufficient documentation

## 2014-08-22 LAB — COMPREHENSIVE METABOLIC PANEL
ALT: 55 U/L — ABNORMAL HIGH (ref 14–54)
ANION GAP: 6 (ref 5–15)
AST: 109 U/L — ABNORMAL HIGH (ref 15–41)
Albumin: 2.2 g/dL — ABNORMAL LOW (ref 3.5–5.0)
Alkaline Phosphatase: 171 U/L — ABNORMAL HIGH (ref 38–126)
BUN: 10 mg/dL (ref 6–20)
CHLORIDE: 104 mmol/L (ref 101–111)
CO2: 24 mmol/L (ref 22–32)
Calcium: 8.5 mg/dL — ABNORMAL LOW (ref 8.9–10.3)
Creatinine, Ser: 0.9 mg/dL (ref 0.44–1.00)
Glucose, Bld: 115 mg/dL — ABNORMAL HIGH (ref 65–99)
POTASSIUM: 4 mmol/L (ref 3.5–5.1)
Sodium: 134 mmol/L — ABNORMAL LOW (ref 135–145)
Total Bilirubin: 2 mg/dL — ABNORMAL HIGH (ref 0.3–1.2)
Total Protein: 7.5 g/dL (ref 6.5–8.1)

## 2014-08-22 LAB — CBC
HEMATOCRIT: 39.8 % (ref 36.0–46.0)
Hemoglobin: 13.8 g/dL (ref 12.0–15.0)
MCH: 35.8 pg — ABNORMAL HIGH (ref 26.0–34.0)
MCHC: 34.7 g/dL (ref 30.0–36.0)
MCV: 103.4 fL — AB (ref 78.0–100.0)
PLATELETS: 121 10*3/uL — AB (ref 150–400)
RBC: 3.85 MIL/uL — AB (ref 3.87–5.11)
RDW: 14.2 % (ref 11.5–15.5)
WBC: 8.3 10*3/uL (ref 4.0–10.5)

## 2014-08-22 LAB — LIPASE, BLOOD: LIPASE: 24 U/L (ref 22–51)

## 2014-08-22 MED ORDER — HYDROCODONE-ACETAMINOPHEN 5-325 MG PO TABS
1.0000 | ORAL_TABLET | Freq: Once | ORAL | Status: AC
  Administered 2014-08-23: 1 via ORAL
  Filled 2014-08-22: qty 1

## 2014-08-22 MED ORDER — ONDANSETRON 4 MG PO TBDP
ORAL_TABLET | ORAL | Status: AC
  Filled 2014-08-22: qty 1

## 2014-08-22 MED ORDER — ONDANSETRON 4 MG PO TBDP
4.0000 mg | ORAL_TABLET | Freq: Once | ORAL | Status: AC
  Administered 2014-08-22: 4 mg via ORAL

## 2014-08-22 MED ORDER — HYDROCODONE-ACETAMINOPHEN 5-325 MG PO TABS
ORAL_TABLET | ORAL | Status: AC
  Administered 2014-08-22: 1
  Filled 2014-08-22: qty 1

## 2014-08-22 NOTE — ED Notes (Signed)
Patient presents via EMS with c/o epigastric pain, diarrhea for 3 days (symptoms the same as before having 3 gallstones removed at hospital in [place])  Also c/o leg pain, fever and chills.  Becomes lightheaded and dizzy at times

## 2014-08-23 ENCOUNTER — Encounter (HOSPITAL_COMMUNITY): Payer: Self-pay

## 2014-08-23 ENCOUNTER — Emergency Department (HOSPITAL_COMMUNITY): Payer: No Typology Code available for payment source

## 2014-08-23 ENCOUNTER — Emergency Department (HOSPITAL_COMMUNITY)
Admission: EM | Admit: 2014-08-23 | Discharge: 2014-08-23 | Disposition: A | Discharge status: Home / Self Care | Payer: Self-pay | Attending: Emergency Medicine | Admitting: Emergency Medicine

## 2014-08-23 DIAGNOSIS — R740 Nonspecific elevation of levels of transaminase and lactic acid dehydrogenase [LDH]: Secondary | ICD-10-CM

## 2014-08-23 DIAGNOSIS — R7401 Elevation of levels of liver transaminase levels: Secondary | ICD-10-CM

## 2014-08-23 DIAGNOSIS — K7469 Other cirrhosis of liver: Secondary | ICD-10-CM

## 2014-08-23 DIAGNOSIS — R17 Unspecified jaundice: Secondary | ICD-10-CM

## 2014-08-23 MED ORDER — OXYCODONE HCL 5 MG PO TABA: 1.0000 | ORAL_TABLET | Freq: Two times a day (BID) | ORAL | Status: DC | PRN

## 2014-08-23 MED ORDER — IOHEXOL 300 MG/ML  SOLN
100.0000 mL | Freq: Once | INTRAMUSCULAR | Status: AC | PRN
  Administered 2014-08-23: 100 mL via INTRAVENOUS

## 2014-08-23 MED ORDER — SODIUM CHLORIDE 0.9 % IV BOLUS (SEPSIS)
500.0000 mL | Freq: Once | INTRAVENOUS | Status: AC
  Administered 2014-08-23: 500 mL via INTRAVENOUS

## 2014-08-23 MED ORDER — MORPHINE SULFATE (PF) 4 MG/ML IV SOLN: 4.0000 mg | Freq: Once | INTRAVENOUS | Status: DC

## 2014-08-23 MED ORDER — HYDROCODONE-ACETAMINOPHEN 5-325 MG PO TABS
1.0000 | ORAL_TABLET | Freq: Once | ORAL | Status: AC
  Administered 2014-08-23: 1 via ORAL
  Filled 2014-08-23: qty 1

## 2014-08-23 NOTE — ED Notes (Signed)
Patient transported to CT 

## 2014-08-23 NOTE — ED Provider Notes (Signed)
CSN: [id number]     Arrival date & time 08/22/14  2156 History  This chart was scribed for Derwood Kaplan, MD by Doreatha Martin, ED Scribe. This patient was seen in room A05C/A05C and the patient's care was started at 3:02 AM.     Chief Complaint  Patient presents with  . Abdominal Pain   The history is provided by the patient. No language interpreter was used.    HPI Comments: Linda Vaughn is a 49 y.o. female with Hx of arthritis, gallstones who presents to the Emergency Department complaining of moderate, intermittent, stabbing epigastric abdominal pain that does not radiate onset 5 days ago. She states associated chills, nausea (no current nausea), leg pain, fever, lightheadedness. She notes a Hx of recent cholecystectomy in 02/2013. Pt notes that the water at her apartment has been yellow and smelly and believes this might be related to her symptoms. Last BM was at 0800. No Hx of hepatitis. She is an occasional drinker. Pt is not currently followed by a PCP. She states she has been denied from [facility] several times and is in the process of applying for disability. She denies diarrhea, vomiting.   Past Medical History  Diagnosis Date  . Arthritis   . Gallstones    Past Surgical History  Procedure Laterality Date  . Ankle surgery    . Carpal tunnel release    . Ankle surgery    . Cesarean section     Family History  Problem Relation Age of Onset  . Cancer Mother   . Rheum arthritis Father   . Diabetes Other    Social History  Substance Use Topics  . Smoking status: Current Every Day Smoker -- 0.50 packs/day  . Smokeless tobacco: Never Used  . Alcohol Use: Yes     Comment: occ   OB History    No data available     Review of Systems  Constitutional: Positive for fever and chills.  Gastrointestinal: Positive for nausea and abdominal pain ( epigastric). Negative for vomiting and diarrhea.  Musculoskeletal: Positive for arthralgias.  Neurological: Positive for  light-headedness.   A complete 10 system review of systems was obtained and all systems are negative except as noted in the HPI and PMH.   Allergies  Review of patient's allergies indicates no known allergies.  Home Medications   Prior to Admission medications   Medication Sig Start Date End Date Taking? Authorizing Provider  diazepam (VALIUM) 5 MG tablet Take 1 tablet (5 mg total) by mouth 2 (two) times daily. Patient not taking: Reported on 12/11/2013 09/12/12   [name], PA-C  HYDROcodone-homatropine [facility]) 5-1.5 MG/5ML syrup Take 5 mLs by mouth every 6 (six) hours as needed for cough. Patient not taking: Reported on 08/23/2014 12/11/13   Roxy Horseman, PA-C  ibuprofen (ADVIL,MOTRIN) 400 MG tablet Take 1 tablet (400 mg total) by mouth every 6 (six) hours as needed for pain. Patient not taking: Reported on 12/11/2013 09/12/12   [name], PA-C   BP 106/57 mmHg  Pulse 66  Temp(Src) 99.2 F (37.3 C) (Oral)  Resp 16  Ht 5' (1.524 m)  Wt 218 lb 12.8 oz (99.247 kg)  BMI 42.73 kg/m2  SpO2 95% Physical Exam  Constitutional: She is oriented to person, place, and time. She appears well-developed and well-nourished.  HENT:  Head: Normocephalic and atraumatic.  Eyes: Conjunctivae and EOM are normal. Pupils are equal, round, and reactive to light.  Neck: Normal range of motion. Neck supple.  Cardiovascular:  Normal rate, regular rhythm and normal heart sounds.   Pulmonary/Chest: Effort normal and breath sounds normal. No respiratory distress.  Lungs CTA.   Abdominal: Soft. Bowel sounds are normal. She exhibits no distension. There is tenderness. There is guarding.  Positive bowel sounds. Pt has epigastric tenderness with some voluntary guarding.   Musculoskeletal: Normal range of motion.  Neurological: She is alert and oriented to person, place, and time.  Skin: Skin is warm and dry.  Psychiatric: She has a normal mood and affect. Her behavior is normal.  Nursing note and vitals  reviewed.  ED Course  Procedures (including critical care time) DIAGNOSTIC STUDIES: Oxygen Saturation is 98% on RA, normal by my interpretation.    COORDINATION OF CARE: 3:15 AM Discussed treatment plan with pt at bedside and pt agreed to plan.   Labs Review Labs Reviewed  COMPREHENSIVE METABOLIC PANEL - Abnormal; Notable for the following:    Sodium 134 (*)    Glucose, Bld 115 (*)    Calcium 8.5 (*)    Albumin 2.2 (*)    AST 109 (*)    ALT 55 (*)    Alkaline Phosphatase 171 (*)    Total Bilirubin 2.0 (*)    All other components within normal limits  CBC - Abnormal; Notable for the following:    RBC 3.85 (*)    MCV 103.4 (*)    MCH 35.8 (*)    Platelets 121 (*)    All other components within normal limits  LIPASE, BLOOD  URINALYSIS, ROUTINE W REFLEX MICROSCOPIC (NOT AT [facility])    Imaging Review Ct Abdomen Pelvis W Contrast  08/23/2014   CLINICAL DATA:  Stabbing epigastric pain with nausea and diarrhea.  EXAM: CT ABDOMEN AND PELVIS WITH CONTRAST  TECHNIQUE: Multidetector CT imaging of the abdomen and pelvis was performed using the standard protocol following bolus administration of intravenous contrast.  CONTRAST:  OMNIPAQUE IOHEXOL 300 MG/ML  SOLN  COMPARISON:  None.  FINDINGS: BODY WALL: No contributory findings.  LOWER CHEST: No contributory findings.  ABDOMEN/PELVIS:  Liver: Cirrhosis with lobulated liver surface, large fissures and caudate lobe, and recannulized periumbilical vein. No splenomegaly. Ascites is small, perihepatic. No visible mass.  Biliary: Cholecystectomy.  No common bile duct enlargement.  Pancreas: Unremarkable.  Spleen: Unremarkable.  Adrenals: Unremarkable.  Kidneys and ureters: No hydronephrosis or stone.  Bladder: Unremarkable.  Reproductive: Refluxing large ovarian veins.  No adnexal mass.  Bowel: Extensive colonic diverticulosis. No obstruction. No appendicitis.  Retroperitoneum: Enlarged lymph nodes in the deep liver drainage, reactive appearing.  Retroperitoneal edema which is likely from volume overload and portal hypertension.  Peritoneum: Trace ascites is noted above.  No pneumoperitoneum.  Vascular: No acute abnormality.  OSSEOUS: No acute abnormalities.  IMPRESSION: 1. No acute finding. 2. Cirrhosis.  Recommend GI referral. 3. Colonic diverticulosis.   Electronically Signed   By: Marnee Spring M.D.   On: 08/23/2014 05:33   I have personally reviewed and evaluated these images and lab results as part of my medical decision-making.   EKG Interpretation None      MDM   Final diagnoses:  Other cirrhosis of liver  Elevated bilirubin  Elevated transaminase level    DDx includes: Pancreatitis Hepatobiliary pathology including retained stone Gastritis/PUD SBO ACS syndrome Aortic Dissection  Pt comes in with abd pain, RUQ and epigastric. She has elevated transaminases and elevated bili. I wanted her to be admitted for Gi consultation and possibly ERCP. Pt however wants to go home.  Patient wants to leave against medical advice. Patient understands that his/her actions will lead to inadequate medical workup, and that he/she is at risk of complications of missed diagnosis, which includes morbidity and mortality.  Patient is demonstrating good capacity to make decision. Patient understands that he/she needs to return to the ER immediately if his/her symptoms get worse.   So - she agreed to CT scan and then going home. CT shows liver cirrhosis signs. Results dicussed, and pt informed to stay away from tylenol and alcohol. She is to see GI as soon as possible.   Derwood Kaplan, MD 08/23/14 [phone]

## 2014-08-23 NOTE — Discharge Instructions (Signed)
The CT scan shows damage to the liver. You must see the GI doctor as soon as possible, Please return to the ER if your symptoms worsen; you have increased pain, fevers, chills, inability to keep any medications down, confusion. Otherwise see the outpatient doctor as requested.   Cirrhosis Cirrhosis is a condition of scarring of the liver which is caused when the liver has tried repairing itself following damage. This damage may come from a previous infection such as one of the forms of hepatitis (usually hepatitis C), or the damage may come from being injured by toxins. The main toxin that causes this damage is alcohol. The scarring of the liver from use of alcohol is irreversible. That means the liver cannot return to normal even though alcohol is not used any more. The main danger of hepatitis C infection is that it may cause long-lasting (chronic) liver disease, and this also may lead to cirrhosis. This complication is progressive and irreversible. CAUSES  Prior to available blood tests, hepatitis C could be contracted by blood transfusions. Since testing of blood has improved, this is now unlikely. This infection can also be contracted through intravenous drug use and the sharing of needles. It can also be contracted through sexual relationships. The injury caused by alcohol comes from too much use. It is not a few drinks that poison the liver, but years of misuse. Usually there will be some signs and symptoms early with scarring of the liver that suggest the development of better habits. Alcohol should never be used while using acetaminophen. A small dose of both taken together may cause irreversible damage to the liver. HOME CARE INSTRUCTIONS  There is no specific treatment for cirrhosis. However, there are things you can do to avoid making the condition worse.  Rest as needed.  Eat a well-balanced diet. Your caregiver can help you with suggestions.  Vitamin supplements including vitamins A, K,  D, and thiamine can help.  A low-salt diet, water restriction, or diuretic medicine may be needed to reduce fluid retention.  Avoid alcohol. This can be extremely toxic if combined with acetaminophen.  Avoid drugs which are toxic to the liver. Some of these include isoniazid, methyldopa, acetaminophen, anabolic steroids (muscle-building drugs), erythromycin, and oral contraceptives (birth control pills). Check with your caregiver to make sure medicines you are presently taking will not be harmful.  Periodic blood tests may be required. Follow your caregiver's advice regarding the timing of these.  Milk thistle is an herbal remedy which does protect the liver against toxins. However, it will not help once the liver has been scarred. SEEK MEDICAL CARE IF:  You have increasing fatigue or weakness.  You develop swelling of the hands, feet, legs, or face.  You vomit bright red blood, or a coffee ground appearing material.  You have blood in your stools, or the stools turn black and tarry.  You have a fever.  You develop loss of appetite, or have nausea and vomiting.  You develop jaundice.  You develop easy bruising or bleeding.  You have worsening of any of the problems you are concerned about. Document Released: 12/23/2004 Document Revised: 03/17/2011 Document Reviewed: 08/11/2007 [facility] Patient Information 2015 [place], [place]. This information is not intended to replace advice given to you by your health care provider. Make sure you discuss any questions you have with your health care provider.

## 2014-09-09 DIAGNOSIS — M1990 Unspecified osteoarthritis, unspecified site: Secondary | ICD-10-CM | POA: Diagnosis not present

## 2014-09-09 DIAGNOSIS — S79912A Unspecified injury of left hip, initial encounter: Secondary | ICD-10-CM | POA: Diagnosis not present

## 2014-09-09 DIAGNOSIS — D696 Thrombocytopenia, unspecified: Secondary | ICD-10-CM | POA: Insufficient documentation

## 2014-09-09 DIAGNOSIS — Y999 Unspecified external cause status: Secondary | ICD-10-CM | POA: Diagnosis not present

## 2014-09-09 DIAGNOSIS — Z720 Tobacco use: Secondary | ICD-10-CM | POA: Insufficient documentation

## 2014-09-09 DIAGNOSIS — Z8719 Personal history of other diseases of the digestive system: Secondary | ICD-10-CM | POA: Insufficient documentation

## 2014-09-09 DIAGNOSIS — S99912A Unspecified injury of left ankle, initial encounter: Secondary | ICD-10-CM | POA: Diagnosis present

## 2014-09-09 DIAGNOSIS — Y939 Activity, unspecified: Secondary | ICD-10-CM | POA: Diagnosis not present

## 2014-09-09 DIAGNOSIS — Y929 Unspecified place or not applicable: Secondary | ICD-10-CM | POA: Insufficient documentation

## 2014-09-09 DIAGNOSIS — S7012XA Contusion of left thigh, initial encounter: Secondary | ICD-10-CM | POA: Diagnosis not present

## 2014-09-09 DIAGNOSIS — W1843XA Slipping, tripping and stumbling without falling due to stepping from one level to another, initial encounter: Secondary | ICD-10-CM | POA: Diagnosis not present

## 2014-09-10 ENCOUNTER — Emergency Department (HOSPITAL_COMMUNITY)
Admission: EM | Admit: 2014-09-10 | Discharge: 2014-09-10 | Disposition: A | Discharge status: Home / Self Care | Payer: Managed Care, Other (non HMO) | Attending: Emergency Medicine | Admitting: Emergency Medicine

## 2014-09-10 ENCOUNTER — Encounter (HOSPITAL_COMMUNITY): Payer: Self-pay | Admitting: Emergency Medicine

## 2014-09-10 ENCOUNTER — Emergency Department (HOSPITAL_COMMUNITY): Payer: Managed Care, Other (non HMO)

## 2014-09-10 DIAGNOSIS — M25552 Pain in left hip: Secondary | ICD-10-CM

## 2014-09-10 DIAGNOSIS — D696 Thrombocytopenia, unspecified: Secondary | ICD-10-CM

## 2014-09-10 DIAGNOSIS — T148XXA Other injury of unspecified body region, initial encounter: Secondary | ICD-10-CM

## 2014-09-10 LAB — COMPREHENSIVE METABOLIC PANEL
ALT: 49 U/L (ref 14–54)
AST: 115 U/L — ABNORMAL HIGH (ref 15–41)
Albumin: 1.9 g/dL — ABNORMAL LOW (ref 3.5–5.0)
Alkaline Phosphatase: 159 U/L — ABNORMAL HIGH (ref 38–126)
Anion gap: 5 (ref 5–15)
BUN: 7 mg/dL (ref 6–20)
CHLORIDE: 105 mmol/L (ref 101–111)
CO2: 26 mmol/L (ref 22–32)
CREATININE: 0.92 mg/dL (ref 0.44–1.00)
Calcium: 8.2 mg/dL — ABNORMAL LOW (ref 8.9–10.3)
GFR calc non Af Amer: >60 mL/min (ref >60)
Glucose, Bld: 103 mg/dL — ABNORMAL HIGH (ref 65–99)
Potassium: 3.6 mmol/L (ref 3.5–5.1)
SODIUM: 136 mmol/L (ref 135–145)
Total Bilirubin: 2.1 mg/dL — ABNORMAL HIGH (ref 0.3–1.2)
Total Protein: 7.4 g/dL (ref 6.5–8.1)

## 2014-09-10 LAB — CBC WITH DIFFERENTIAL/PLATELET
BASOS ABS: 0 10*3/uL (ref 0.0–0.1)
Basophils Relative: 1 % (ref 0–1)
EOS ABS: 0.3 10*3/uL (ref 0.0–0.7)
EOS PCT: 5 % (ref 0–5)
HCT: 30.6 % — ABNORMAL LOW (ref 36.0–46.0)
Hemoglobin: 10.9 g/dL — ABNORMAL LOW (ref 12.0–15.0)
LYMPHS ABS: 2.6 10*3/uL (ref 0.7–4.0)
Lymphocytes Relative: 47 % — ABNORMAL HIGH (ref 12–46)
MCH: 36.5 pg — AB (ref 26.0–34.0)
MCHC: 35.6 g/dL (ref 30.0–36.0)
MCV: 102.3 fL — ABNORMAL HIGH (ref 78.0–100.0)
Monocytes Absolute: 0.9 10*3/uL (ref 0.1–1.0)
Monocytes Relative: 16 % — ABNORMAL HIGH (ref 3–12)
Neutro Abs: 1.8 10*3/uL (ref 1.7–7.7)
Neutrophils Relative %: 32 % — ABNORMAL LOW (ref 43–77)
PLATELETS: 105 10*3/uL — AB (ref 150–400)
RBC: 2.99 MIL/uL — AB (ref 3.87–5.11)
RDW: 14.7 % (ref 11.5–15.5)
WBC: 5.5 10*3/uL (ref 4.0–10.5)

## 2014-09-10 LAB — APTT: aPTT: 36 seconds (ref 24–37)

## 2014-09-10 LAB — PROTIME-INR
INR: 1.54 — AB (ref 0.00–1.49)
PROTHROMBIN TIME: 18.5 s — AB (ref 11.6–15.2)

## 2014-09-10 MED ORDER — OXYCODONE HCL 5 MG PO TABS
10.0000 mg | ORAL_TABLET | Freq: Once | ORAL | Status: AC
  Administered 2014-09-10: 10 mg via ORAL
  Filled 2014-09-10: qty 2

## 2014-09-10 MED ORDER — OXYCODONE HCL 5 MG PO TABS: 5.0000 mg | ORAL_TABLET | ORAL | Status: DC | PRN

## 2014-09-10 MED ORDER — HYDROMORPHONE HCL 1 MG/ML IJ SOLN
1.0000 mg | Freq: Once | INTRAMUSCULAR | Status: AC
  Administered 2014-09-10: 1 mg via INTRAMUSCULAR
  Filled 2014-09-10: qty 1

## 2014-09-10 MED ORDER — DOCUSATE SODIUM 100 MG PO CAPS: 100.0000 mg | ORAL_CAPSULE | Freq: Two times a day (BID) | ORAL | Status: DC

## 2014-09-10 NOTE — ED Notes (Signed)
Called Dr. Elesa Massed to report pain medication did not provide relief. She acknowledges, no new orders.

## 2014-09-10 NOTE — Discharge Instructions (Signed)
Hip Pain Your hip is the joint between your upper legs and your lower pelvis. The bones, cartilage, tendons, and muscles of your hip joint perform a lot of work each day supporting your body weight and allowing you to move around. Hip pain can range from a minor ache to severe pain in one or both of your hips. Pain may be felt on the inside of the hip joint near the groin, or the outside near the buttocks and upper thigh. You may have swelling or stiffness as well.  HOME CARE INSTRUCTIONS   Take medicines only as directed by your health care provider.  Apply ice to the injured area:  Put ice in a plastic bag.  Place a towel between your skin and the bag.  Leave the ice on for 15-20 minutes at a time, 3-4 times a day.  Keep your leg raised (elevated) when possible to lessen swelling.  Avoid activities that cause pain.  Follow specific exercises as directed by your health care provider.  Sleep with a pillow between your legs on your most comfortable side.  Record how often you have hip pain, the location of the pain, and what it feels like. SEEK MEDICAL CARE IF:   You are unable to put weight on your leg.  Your hip is red or swollen or very tender to touch.  Your pain or swelling continues or worsens after 1 week.  You have increasing difficulty walking.  You have a fever. SEEK IMMEDIATE MEDICAL CARE IF:   You have fallen.  You have a sudden increase in pain and swelling in your hip. MAKE SURE YOU:   Understand these instructions.  Will watch your condition.  Will get help right away if you are not doing well or get worse. Document Released: 06/12/2009 Document Revised: 05/09/2013 Document Reviewed: 08/19/2012 [facility] Patient Information 2015 [place], [place]. This information is not intended to replace advice given to you by your health care provider. Make sure you discuss any questions you have with your health care provider.  Hematoma A hematoma is a collection of  blood under the skin, in an organ, in a body space, in a joint space, or in other tissue. The blood can clot to form a lump that you can see and feel. The lump is often firm and may sometimes become sore and tender. Most hematomas get better in a few days to weeks. However, some hematomas may be serious and require medical care. Hematomas can range in size from very small to very large. CAUSES  A hematoma can be caused by a blunt or penetrating injury. It can also be caused by spontaneous leakage from a blood vessel under the skin. Spontaneous leakage from a blood vessel is more likely to occur in older people, especially those taking blood thinners. Sometimes, a hematoma can develop after certain medical procedures. SIGNS AND SYMPTOMS   A firm lump on the body.  Possible pain and tenderness in the area.  Bruising.Blue, dark blue, purple-red, or yellowish skin may appear at the site of the hematoma if the hematoma is close to the surface of the skin. For hematomas in deeper tissues or body spaces, the signs and symptoms may be subtle. For example, an intra-abdominal hematoma may cause abdominal pain, weakness, fainting, and shortness of breath. An intracranial hematoma may cause a headache or symptoms such as weakness, trouble speaking, or a change in consciousness. DIAGNOSIS  A hematoma can usually be diagnosed based on your medical history and a  physical exam. Imaging tests may be needed if your health care provider suspects a hematoma in deeper tissues or body spaces, such as the abdomen, head, or chest. These tests may include ultrasonography or a CT scan.  TREATMENT  Hematomas usually go away on their own over time. Rarely does the blood need to be drained out of the body. Large hematomas or those that may affect vital organs will sometimes need surgical drainage or monitoring. HOME CARE INSTRUCTIONS   Apply ice to the injured area:   Put ice in a plastic bag.   Place a towel between  your skin and the bag.   Leave the ice on for 20 minutes, 2-3 times a day for the first 1 to 2 days.   After the first 2 days, switch to using warm compresses on the hematoma.   Elevate the injured area to help decrease pain and swelling. Wrapping the area with an elastic bandage may also be helpful. Compression helps to reduce swelling and promotes shrinking of the hematoma. Make sure the bandage is not wrapped too tight.   If your hematoma is on a lower extremity and is painful, crutches may be helpful for a couple days.   Only take over-the-counter or prescription medicines as directed by your health care provider. SEEK IMMEDIATE MEDICAL CARE IF:   You have increasing pain, or your pain is not controlled with medicine.   You have a fever.   You have worsening swelling or discoloration.   Your skin over the hematoma breaks or starts bleeding.   Your hematoma is in your chest or abdomen and you have weakness, shortness of breath, or a change in consciousness.  Your hematoma is on your scalp (caused by a fall or injury) and you have a worsening headache or a change in alertness or consciousness. MAKE SURE YOU:   Understand these instructions.  Will watch your condition.  Will get help right away if you are not doing well or get worse. Document Released: 08/07/2003 Document Revised: 08/25/2012 Document Reviewed: 06/02/2012 [facility] Patient Information 2015 [place], [place]. This information is not intended to replace advice given to you by your health care provider. Make sure you discuss any questions you have with your health care provider.   RICE: Routine Care for Injuries The routine care of many injuries includes Rest, Ice, Compression, and Elevation (RICE). HOME CARE INSTRUCTIONS  Rest is needed to allow your body to heal. Routine activities can usually be resumed when comfortable. Injured tendons and bones can take up to 6 weeks to heal. Tendons are the cord-like  structures that attach muscle to bone.  Ice following an injury helps keep the swelling down and reduces pain.  Put ice in a plastic bag.  Place a towel between your skin and the bag.  Leave the ice on for 15-20 minutes, 3-4 times a day, or as directed by your health care provider. Do this while awake, for the first 24 to 48 hours. After that, continue as directed by your caregiver.  Compression helps keep swelling down. It also gives support and helps with discomfort. If an elastic bandage has been applied, it should be removed and reapplied every 3 to 4 hours. It should not be applied tightly, but firmly enough to keep swelling down. Watch fingers or toes for swelling, bluish discoloration, coldness, numbness, or excessive pain. If any of these problems occur, remove the bandage and reapply loosely. Contact your caregiver if these problems continue.  Elevation helps reduce  swelling and decreases pain. With extremities, such as the arms, hands, legs, and feet, the injured area should be placed near or above the level of the heart, if possible. SEEK IMMEDIATE MEDICAL CARE IF:  You have persistent pain and swelling.  You develop redness, numbness, or unexpected weakness.  Your symptoms are getting worse rather than improving after several days. These symptoms may indicate that further evaluation or further X-rays are needed. Sometimes, X-rays may not show a small broken bone (fracture) until 1 week or 10 days later. Make a follow-up appointment with your caregiver. Ask when your X-ray results will be ready. Make sure you get your X-ray results. Document Released: 04/06/2000 Document Revised: 12/28/2012 Document Reviewed: 05/24/2010 [facility] Patient Information 2015 [place], [place]. This information is not intended to replace advice given to you by your health care provider. Make sure you discuss any questions you have with your health care provider.
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
## 2014-09-10 NOTE — ED Notes (Signed)
Patient is gone to xray 

## 2014-09-10 NOTE — ED Notes (Signed)
Called Dr. Elesa Massed, as patient requests percocet for prescription. MD acknowledges, explains patient has cirrhosis, no tylenol needed. Explained to patient, she acknowledges.
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
## 2014-12-19 ENCOUNTER — Emergency Department (HOSPITAL_COMMUNITY): Payer: Managed Care, Other (non HMO)

## 2014-12-19 ENCOUNTER — Emergency Department (HOSPITAL_COMMUNITY)
Admission: EM | Admit: 2014-12-19 | Discharge: 2014-12-19 | Disposition: A | Discharge status: Home / Self Care | Payer: Managed Care, Other (non HMO) | Attending: Emergency Medicine | Admitting: Emergency Medicine

## 2014-12-19 ENCOUNTER — Encounter (HOSPITAL_COMMUNITY): Payer: Self-pay

## 2014-12-19 DIAGNOSIS — S2242XA Multiple fractures of ribs, left side, initial encounter for closed fracture: Secondary | ICD-10-CM | POA: Insufficient documentation

## 2014-12-19 DIAGNOSIS — S3992XA Unspecified injury of lower back, initial encounter: Secondary | ICD-10-CM | POA: Insufficient documentation

## 2014-12-19 DIAGNOSIS — Y9289 Other specified places as the place of occurrence of the external cause: Secondary | ICD-10-CM | POA: Insufficient documentation

## 2014-12-19 DIAGNOSIS — M1990 Unspecified osteoarthritis, unspecified site: Secondary | ICD-10-CM | POA: Diagnosis not present

## 2014-12-19 DIAGNOSIS — X58XXXA Exposure to other specified factors, initial encounter: Secondary | ICD-10-CM | POA: Insufficient documentation

## 2014-12-19 DIAGNOSIS — S8990XA Unspecified injury of unspecified lower leg, initial encounter: Secondary | ICD-10-CM | POA: Diagnosis not present

## 2014-12-19 DIAGNOSIS — Z8719 Personal history of other diseases of the digestive system: Secondary | ICD-10-CM | POA: Diagnosis not present

## 2014-12-19 DIAGNOSIS — Y9389 Activity, other specified: Secondary | ICD-10-CM | POA: Diagnosis not present

## 2014-12-19 DIAGNOSIS — R6883 Chills (without fever): Secondary | ICD-10-CM | POA: Insufficient documentation

## 2014-12-19 DIAGNOSIS — Z87891 Personal history of nicotine dependence: Secondary | ICD-10-CM | POA: Diagnosis not present

## 2014-12-19 DIAGNOSIS — Y998 Other external cause status: Secondary | ICD-10-CM | POA: Insufficient documentation

## 2014-12-19 DIAGNOSIS — S299XXA Unspecified injury of thorax, initial encounter: Secondary | ICD-10-CM | POA: Diagnosis present

## 2014-12-19 DIAGNOSIS — S2232XA Fracture of one rib, left side, initial encounter for closed fracture: Secondary | ICD-10-CM

## 2014-12-19 DIAGNOSIS — R52 Pain, unspecified: Secondary | ICD-10-CM

## 2014-12-19 MED ORDER — OXYCODONE-ACETAMINOPHEN 5-325 MG PO TABS
1.0000 | ORAL_TABLET | Freq: Once | ORAL | Status: AC
  Administered 2014-12-19: 1 via ORAL
  Filled 2014-12-19: qty 1

## 2014-12-19 MED ORDER — OXYCODONE-ACETAMINOPHEN 5-325 MG PO TABS: 1.0000 | ORAL_TABLET | ORAL | Status: DC | PRN

## 2014-12-19 NOTE — ED Provider Notes (Signed)
CSN: [id number]     Arrival date & time 12/19/14  0848 History   First MD Initiated Contact with Patient 12/19/14 0913     Chief Complaint  Patient presents with  . Rib Injury    HPI  [name] is a 49 year old female with PMH of Cirrhosis, arthritis, and gallstones s/p cholecystectomy (02/2013 per patient) who presents with left sided rib pain. The pain began on Monday when she was playing with her friend who picked her up around the chest and she heard a "pop." The pain occurs beneath the left breast at the anterior chest as well as the lateral and posterior left chest wall. Pain is worse when laying on her left side and with deep inspiration. She tried OTC Tylenol and Ibuprofen without much relief. She denies any other trauma or injury to the area. She denies radiation to the arms, neck, or jaw, no numbness/tingling, diaphoresis, SOB, hemoptysis, nausea, vomiting, or significant cardiac history.   Past Medical History  Diagnosis Date  . Arthritis   . Gallstones    Past Surgical History  Procedure Laterality Date  . Ankle surgery    . Carpal tunnel release    . Ankle surgery    . Cesarean section    . Cholecystectomy     Family History  Problem Relation Age of Onset  . Cancer Mother   . Rheum arthritis Father   . Diabetes Other    Social History  Substance Use Topics  . Smoking status: Former Smoker -- 0.50 packs/day  . Smokeless tobacco: Never Used  . Alcohol Use: Yes     Comment: occ   OB History    No data available     Review of Systems  Constitutional: Positive for chills. Negative for fever and diaphoresis.  Eyes: Negative for visual disturbance.  Respiratory: Negative for cough and shortness of breath.   Cardiovascular: Positive for chest pain and leg swelling.  Gastrointestinal: Negative for nausea, vomiting, abdominal pain, diarrhea and blood in stool.  Genitourinary: Negative for hematuria.  Musculoskeletal: Positive for back pain. Negative for neck  pain.  Skin: Negative for rash.  Neurological: Negative for tremors, numbness and headaches.  Hematological: Does not bruise/bleed easily.      Allergies  Aspirin  Home Medications   Prior to Admission medications   Medication Sig Start Date End Date Taking? Authorizing Provider  docusate sodium (COLACE) 100 MG capsule Take 1 capsule (100 mg total) by mouth every 12 (twelve) hours. 09/10/14   Kristen N Ward, DO  oxyCODONE (ROXICODONE) 5 MG immediate release tablet Take 1-2 tablets (5-10 mg total) by mouth every 4 (four) hours as needed for severe pain. 09/10/14   Kristen N Ward, DO   BP 117/63 mmHg  Pulse 77  Temp(Src) 98.9 F (37.2 C) (Oral)  Resp 18  SpO2 100% Physical Exam  Constitutional: She is oriented to person, place, and time. She appears well-developed and well-nourished.  Mild distress  HENT:  Head: Normocephalic and atraumatic.  Cardiovascular: Normal rate and regular rhythm.   No murmur heard. Pulmonary/Chest: She has no wheezes. She has no rales. She exhibits tenderness.  Decreased air movement due to pain, otherwise clear bilaterally.  Abdominal: Soft. There is no tenderness.  Musculoskeletal:  Tender to palpation at anterior, lateral, and posterior left chest wall beneath the left breast at approximately the 7th rib.  Neurological: She is alert and oriented to person, place, and time.  Skin: Skin is warm. No pallor.  ED Course  Procedures (including critical care time) Labs Review Labs Reviewed - No data to display  Imaging Review Dg Chest 2 View  12/19/2014  CLINICAL DATA:  Significant left upper side pain, pain with deep breathing EXAM: CHEST  2 VIEW COMPARISON:  12/11/2013 FINDINGS: Cardiomediastinal silhouette is stable. Mild degenerative changes thoracic spine. There is streaky atelectasis or infiltrate in lingula posteriorly best seen on lateral view. Question healing nondisplaced fracture of the left seventh rib. No pneumothorax. IMPRESSION: Mild  degenerative changes thoracic spine. There is streaky atelectasis or infiltrate in lingula posteriorly best seen on lateral view. Question healing nondisplaced fracture of the left seventh rib. Electronically Signed   By: Natasha MeadLiviu  Pop M.D.   On: 12/19/2014 10:47   Dg Ribs Unilateral Left  12/19/2014  CLINICAL DATA:  Left rib pain, worsening pain with coughing and deep breathing for 1 week EXAM: LEFT RIBS - 2 VIEW COMPARISON:  Chest x-ray same day FINDINGS: Two views left ribs submitted. There is minimal displaced fracture of the left third rib. Mild displaced fracture of the left fourth, fifth and sixth rib. Nondisplaced fracture of the left seventh rib. No pneumothorax. IMPRESSION: There is minimal displaced fracture of the left third rib. Mild displaced fracture of the left fourth, fifth and sixth rib. Nondisplaced fracture of the left seventh rib. No pneumothorax. Electronically Signed   By: Natasha MeadLiviu  Pop M.D.   On: 12/19/2014 11:51   I have personally reviewed and evaluated these images and lab results as part of my medical decision-making.   EKG Interpretation None      MDM   Final diagnoses:  Pain  [name] is a 49 year old female with PMH of Cirrhosis, arthritis, and gallstones s/p cholecystectomy (02/2013 per patient) who presents with left sided rib pain after hearing a "pop" when picked up by her friend when they were play wrestling. Pain is worse lying on her left side or with deep inspiration. It is reproducible with palpation. Initial concerns is for rib fracture or musculoskeletal/costochondritis pain. She is afebrile, heart rate and blood pressures stable, and saturating 100% on room air. No hemoptysis. Lungs are clear bilaterally, low suspicion for pneumonia, pneumothorax, or PE.  2V CXR shows possible healing nondisplaced fracture of the left 7th rib. Left ribs Xray ordered which shows minimal displaced fracture of the third left rib, mild displaced fracture of left 4th, 5th,  and 6th rib, and non-displaced fracture of the left 7th rib. No pneumothorax. She denies any other injury or trauma to the area.  Discussed with patient who is agreeable to discharge to home with prescription for narcotic pain medication. She is advised on strict return precautions and to avoid driving/operating heavy machinery when taking the Percocet.     Darreld McleanVishal Shakeita Vandevander, MD 12/19/14 [id number]  Mancel BaleElliott Wentz, MD 12/19/14 [phone]
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
## 2014-12-19 NOTE — ED Notes (Signed)
Pt taken to Xray.

## 2014-12-19 NOTE — ED Notes (Signed)
MD Patel at the bedside

## 2014-12-19 NOTE — Discharge Instructions (Signed)
You have several rib fractures on the left side of your chest. Treatment is with pain management and time. It may take up to 6 weeks to heal. We are giving you a prescription for pain medicine. Please do not drive or operate heavy machinery while taking this medication. Please avoid over-exerting yourself and if your symptoms become more severe, you have worsening breathing, or fevers, please return to the Emergency Department.  Rib Fracture A rib fracture is a break or crack in one of the bones of the ribs. The ribs are a group of long, curved bones that wrap around your chest and attach to your spine. They protect your lungs and other organs in the chest cavity. A broken or cracked rib is often painful, but most do not cause other problems. Most rib fractures heal on their own over time. However, rib fractures can be more serious if multiple ribs are broken or if broken ribs move out of place and push against other structures. CAUSES   A direct blow to the chest. For example, this could happen during contact sports, a car accident, or a fall against a hard object.  Repetitive movements with high force, such as pitching a baseball or having severe coughing spells. SYMPTOMS   Pain when you breathe in or cough.  Pain when someone presses on the injured area. DIAGNOSIS  Your caregiver will perform a physical exam. Various imaging tests may be ordered to confirm the diagnosis and to look for related injuries. These tests may include a chest X-ray, computed tomography (CT), magnetic resonance imaging (MRI), or a bone scan. TREATMENT  Rib fractures usually heal on their own in 1-3 months. The longer healing period is often associated with a continued cough or other aggravating activities. During the healing period, pain control is very important. Medication is usually given to control pain. Hospitalization or surgery may be needed for more severe injuries, such as those in which multiple ribs are broken  or the ribs have moved out of place.  HOME CARE INSTRUCTIONS   Avoid strenuous activity and any activities or movements that cause pain. Be careful during activities and avoid bumping the injured rib.  Gradually increase activity as directed by your caregiver.  Only take over-the-counter or prescription medications as directed by your caregiver. Do not take other medications without asking your caregiver first.  Apply ice to the injured area for the first 1-2 days after you have been treated or as directed by your caregiver. Applying ice helps to reduce inflammation and pain.  Put ice in a plastic bag.  Place a towel between your skin and the bag.   Leave the ice on for 15-20 minutes at a time, every 2 hours while you are awake.  Perform deep breathing as directed by your caregiver. This will help prevent pneumonia, which is a common complication of a broken rib. Your caregiver may instruct you to:  Take deep breaths several times a day.  Try to cough several times a day, holding a pillow against the injured area.  Use a device called an incentive spirometer to practice deep breathing several times a day.  Drink enough fluids to keep your urine clear or pale yellow. This will help you avoid constipation.   Do not wear a rib belt or binder. These restrict breathing, which can lead to pneumonia.  SEEK IMMEDIATE MEDICAL CARE IF:   You have a fever.   You have difficulty breathing or shortness of breath.   You  develop a continual cough, or you cough up thick or bloody sputum.  You feel sick to your stomach (nausea), throw up (vomit), or have abdominal pain.   You have worsening pain not controlled with medications.  MAKE SURE YOU:  Understand these instructions.  Will watch your condition.  Will get help right away if you are not doing well or get worse.   This information is not intended to replace advice given to you by your health care provider. Make sure you  discuss any questions you have with your health care provider.   Document Released: 12/23/2004 Document Revised: 08/25/2012 Document Reviewed: 02/25/2012 Elsevier Interactive Patient Education [employer]

## 2014-12-19 NOTE — ED Notes (Signed)
Pt comfortable with discharge and follow up instructions. Pt declines wheelchair, escorted to waiting area by this RN. Prescriptions x1. 

## 2014-12-19 NOTE — ED Notes (Signed)
MD Wentz at the bedside  

## 2014-12-19 NOTE — ED Notes (Signed)
Per PT, Patient was picked up by friend when she had pain from her left rib around to her left breast. Pt reports pain continued and began to get worse of the last week. Reports worsening [ain with coughing and deep breathing.

## 2014-12-19 NOTE — ED Notes (Signed)
Pt returned from X-ray.  

## 2015-01-09 ENCOUNTER — Encounter (HOSPITAL_COMMUNITY): Payer: Self-pay | Admitting: Emergency Medicine

## 2015-01-09 ENCOUNTER — Emergency Department (HOSPITAL_COMMUNITY)
Admission: EM | Admit: 2015-01-09 | Discharge: 2015-01-09 | Disposition: A | Discharge status: Home / Self Care | Payer: Medicaid Other | Attending: Physician Assistant | Admitting: Physician Assistant

## 2015-01-09 ENCOUNTER — Emergency Department (EMERGENCY_DEPARTMENT_HOSPITAL)
Admit: 2015-01-09 | Discharge: 2015-01-09 | Disposition: A | Discharge status: Home / Self Care | Payer: Medicaid Other | Attending: Physician Assistant | Admitting: Physician Assistant

## 2015-01-09 DIAGNOSIS — Z87891 Personal history of nicotine dependence: Secondary | ICD-10-CM | POA: Diagnosis not present

## 2015-01-09 DIAGNOSIS — G8929 Other chronic pain: Secondary | ICD-10-CM | POA: Diagnosis not present

## 2015-01-09 DIAGNOSIS — R609 Edema, unspecified: Secondary | ICD-10-CM

## 2015-01-09 DIAGNOSIS — M79605 Pain in left leg: Secondary | ICD-10-CM | POA: Insufficient documentation

## 2015-01-09 DIAGNOSIS — M79604 Pain in right leg: Secondary | ICD-10-CM | POA: Diagnosis not present

## 2015-01-09 MED ORDER — OXYCODONE-ACETAMINOPHEN 5-325 MG PO TABS: 1.0000 | ORAL_TABLET | Freq: Three times a day (TID) | ORAL | Status: DC | PRN

## 2015-01-09 MED ORDER — OXYCODONE-ACETAMINOPHEN 5-325 MG PO TABS
1.0000 | ORAL_TABLET | Freq: Once | ORAL | Status: AC
  Administered 2015-01-09: 1 via ORAL
  Filled 2015-01-09: qty 1
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
## 2015-01-09 NOTE — Progress Notes (Signed)
CSW has staffed with ED Case Manager, Reeves Forthamille Wood, regarding assistance with finding a PCP that accepts patient's insurance. Case Manager to follow. CSW signing off at this time. Please contact if new need(s) arise.   Noe GensAshley Gardner, Theresia MajorsLCSWA [facility] ED Clinical Social Worker [phone]

## 2015-01-09 NOTE — Discharge Instructions (Signed)
Please follow up with the appointment we provided fro you to establish PCP care. Please return with any other concerns.   Chronic Pain Chronic pain can be defined as pain that is off and on and lasts for 3-6 months or longer. Many things cause chronic pain, which can make it difficult to make a diagnosis. There are many treatment options available for chronic pain. However, finding a treatment that works well for you may require trying various approaches until the right one is found. Many people benefit from a combination of two or more types of treatment to control their pain. SYMPTOMS  Chronic pain can occur anywhere in the body and can range from mild to very severe. Some types of chronic pain include:  Headache.  Low back pain.  Cancer pain.  Arthritis pain.  Neurogenic pain. This is pain resulting from damage to nerves. People with chronic pain may also have other symptoms such as:  Depression.  Anger.  Insomnia.  Anxiety. DIAGNOSIS  Your health care provider will help diagnose your condition over time. In many cases, the initial focus will be on excluding possible conditions that could be causing the pain. Depending on your symptoms, your health care provider may order tests to diagnose your condition. Some of these tests may include:   Blood tests.   CT scan.   MRI.   X-rays.   Ultrasounds.   Nerve conduction studies.  You may need to see a specialist.  TREATMENT  Finding treatment that works well may take time. You may be referred to a pain specialist. He or she may prescribe medicine or therapies, such as:   Mindful meditation or yoga.  Shots (injections) of numbing or pain-relieving medicines into the spine or area of pain.  Local electrical stimulation.  Acupuncture.   Massage therapy.   Aroma, color, light, or sound therapy.   Biofeedback.   Working with a physical therapist to keep from getting stiff.   Regular, gentle exercise.    Cognitive or behavioral therapy.   Group support.  Sometimes, surgery may be recommended.  HOME CARE INSTRUCTIONS   Take all medicines as directed by your health care provider.   Lessen stress in your life by relaxing and doing things such as listening to calming music.   Exercise or be active as directed by your health care provider.   Eat a healthy diet and include things such as vegetables, fruits, fish, and lean meats in your diet.   Keep all follow-up appointments with your health care provider.   Attend a support group with others suffering from chronic pain. SEEK MEDICAL CARE IF:   Your pain gets worse.   You develop a new pain that was not there before.   You cannot tolerate medicines given to you by your health care provider.   You have new symptoms since your last visit with your health care provider.  SEEK IMMEDIATE MEDICAL CARE IF:   You feel weak.   You have decreased sensation or numbness.   You lose control of bowel or bladder function.   Your pain suddenly gets much worse.   You develop shaking.  You develop chills.  You develop confusion.  You develop chest pain.  You develop shortness of breath.  MAKE SURE YOU:  Understand these instructions.  Will watch your condition.  Will get help right away if you are not doing well or get worse.   This information is not intended to replace advice given to you by  your health care provider. Make sure you discuss any questions you have with your health care provider.   Document Released: 09/14/2001 Document Revised: 08/25/2012 Document Reviewed: 06/18/2012 Elsevier Interactive Patient Education 2016 [employer]  Edema Edema is an abnormal buildup of fluids. It is more common in your legs and thighs. Painless swelling of the feet and ankles is more likely as a person ages. It also is common in looser skin, like around your eyes. HOME CARE   Keep the affected body part above the  level of the heart while lying down.  Do not sit still or stand for a long time.  Do not put anything right under your knees when you lie down.  Do not wear tight clothes on your upper legs.  Exercise your legs to help the puffiness (swelling) go down.  Wear elastic bandages or support stockings as told by your doctor.  A low-salt diet may help lessen the puffiness.  Only take medicine as told by your doctor. GET HELP IF:  Treatment is not working.  You have heart, liver, or kidney disease and notice that your skin looks puffy or shiny.  You have puffiness in your legs that does not get better when you raise your legs.  You have sudden weight gain for no reason. GET HELP RIGHT AWAY IF:   You have shortness of breath or chest pain.  You cannot breathe when you lie down.  You have pain, redness, or warmth in the areas that are puffy.  You have heart, liver, or kidney disease and get edema all of a sudden.  You have a fever and your symptoms get worse all of a sudden. MAKE SURE YOU:   Understand these instructions.  Will watch your condition.  Will get help right away if you are not doing well or get worse.   This information is not intended to replace advice given to you by your health care provider. Make sure you discuss any questions you have with your health care provider.   Document Released: 06/11/2007 Document Revised: 12/28/2012 Document Reviewed: 10/15/2012 Elsevier Interactive Patient Education [employer]

## 2015-01-09 NOTE — Progress Notes (Signed)
*  PRELIMINARY RESULTS* Vascular Ultrasound Lower extremity venous duplex has been completed.  Preliminary findings: No evidence of DVT. Left baker's cyst noted.   Farrel DemarkJill Eunice, RDMS, RVT  01/09/2015, 10:52 AM
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
## 2015-01-09 NOTE — ED Provider Notes (Signed)
CSN: [id number]     Arrival date & time 01/09/15  0913 History   First MD Initiated Contact with Patient 01/09/15 [phone]     Chief Complaint  Patient presents with  . Leg Pain     (Consider location/radiation/quality/duration/timing/severity/associated sxs/prior Treatment) HPI   She is a 50 year old female complaining of bilateral leg pain and swelling. Patient reports that she's had slight pain and swelling for years. She reports the swelling somewhat worse over the last couple days. She reports is from her arthritis in her left knee and a plate that was placed for fractured ankle. Additionally she has arthritis in the right knee that she complains about. She is tearful on exam. Patient states she her insurance does not allow her to get into any primary care physician. She is asking for prescription for pain medications on arrival.  Past Medical History  Diagnosis Date  . Arthritis   . Gallstones    Past Surgical History  Procedure Laterality Date  . Ankle surgery    . Carpal tunnel release    . Ankle surgery    . Cesarean section    . Cholecystectomy     Family History  Problem Relation Age of Onset  . Cancer Mother   . Rheum arthritis Father   . Diabetes Other    Social History  Substance Use Topics  . Smoking status: Former Smoker -- 0.50 packs/day  . Smokeless tobacco: Never Used  . Alcohol Use: Yes     Comment: occ   OB History    No data available     Review of Systems  Constitutional: Negative for activity change and fatigue.  HENT: Negative for congestion.   Respiratory: Negative for cough, chest tightness and shortness of breath.   Cardiovascular: Positive for leg swelling. Negative for chest pain.  Gastrointestinal: Negative for abdominal distention.  Genitourinary: Negative for dysuria.  Musculoskeletal: Negative for joint swelling.  Skin: Negative for rash.      Allergies  Aspirin  Home Medications   Prior to Admission medications   Medication  Sig Start Date End Date Taking? Authorizing Provider  oxyCODONE-acetaminophen (PERCOCET/ROXICET) 5-325 MG tablet Take 1 tablet by mouth every 8 (eight) hours as needed for severe pain. 01/09/15   Courteney Lyn Mackuen, MD   BP 134/95 mmHg  Pulse 77  Temp(Src) 98.7 F (37.1 C) (Oral)  Resp 14  SpO2 99% Physical Exam  Constitutional: She is oriented to person, place, and time. She appears well-developed and well-nourished.  HENT:  Head: Normocephalic and atraumatic.  Eyes: Conjunctivae are normal. Right eye exhibits no discharge.  Neck: Neck supple.  Cardiovascular: Normal rate, regular rhythm and normal heart sounds.   No murmur heard. Pulmonary/Chest: Effort normal and breath sounds normal. She has no wheezes. She has no rales.  Abdominal: Soft. She exhibits no distension. There is no tenderness.  Musculoskeletal: Normal range of motion. She exhibits no edema.  Minimal swelling to bilateral extremities. Full range of motion sensation intact. Strength intact.  Neurological: She is oriented to person, place, and time. No cranial nerve deficit.  Skin: Skin is warm and dry. No rash noted. She is not diaphoretic.  Psychiatric: She has a normal mood and affect. Her behavior is normal.  Nursing note and vitals reviewed.   ED Course  Procedures (including critical care time) Labs Review Labs Reviewed - No data to display  Imaging Review No results found. I have personally reviewed and evaluated these images and lab results as part of  my medical decision-making.   EKG Interpretation None      MDM   Final diagnoses:  Chronic pain   50 year old female complaining of bilateral leg pain and swelling. Swelling is very minimal exam. We'll do bilateral ultrasounds to make sure she does not have any DVT. We'll talk to social work about getting patient appointment with a physician. I informed the patient she will receive 1 Percocet here and a prescription for only 5 pills because I do not  fill comfortable giving her more than that at this time.  11:44 AM US shows no DVT. Patient ambulatory with her cane at baseline. Care managemetn set her up for PCP appointment.    Courteney Randall An, MD 01/09/15 1145

## 2015-01-25 ENCOUNTER — Encounter: Payer: Self-pay | Admitting: Family Medicine

## 2015-01-25 ENCOUNTER — Ambulatory Visit (INDEPENDENT_AMBULATORY_CARE_PROVIDER_SITE_OTHER): Payer: Self-pay | Admitting: Family Medicine

## 2015-01-25 VITALS — BP 114/75 | HR 69 | Temp 98.1°F | Resp 16 | Ht 60.0 in | Wt 200.0 lb

## 2015-01-25 DIAGNOSIS — F32A Depression, unspecified: Secondary | ICD-10-CM

## 2015-01-25 DIAGNOSIS — M25572 Pain in left ankle and joints of left foot: Secondary | ICD-10-CM

## 2015-01-25 DIAGNOSIS — I10 Essential (primary) hypertension: Secondary | ICD-10-CM

## 2015-01-25 DIAGNOSIS — G4700 Insomnia, unspecified: Secondary | ICD-10-CM

## 2015-01-25 DIAGNOSIS — G8929 Other chronic pain: Secondary | ICD-10-CM

## 2015-01-25 DIAGNOSIS — M158 Other polyosteoarthritis: Secondary | ICD-10-CM

## 2015-01-25 DIAGNOSIS — N390 Urinary tract infection, site not specified: Secondary | ICD-10-CM

## 2015-01-25 DIAGNOSIS — E669 Obesity, unspecified: Secondary | ICD-10-CM

## 2015-01-25 DIAGNOSIS — F329 Major depressive disorder, single episode, unspecified: Secondary | ICD-10-CM

## 2015-01-25 DIAGNOSIS — R8290 Unspecified abnormal findings in urine: Secondary | ICD-10-CM

## 2015-01-25 DIAGNOSIS — M1990 Unspecified osteoarthritis, unspecified site: Secondary | ICD-10-CM | POA: Insufficient documentation

## 2015-01-25 LAB — CBC WITH DIFFERENTIAL/PLATELET
BASOS ABS: 0.1 10*3/uL (ref 0.0–0.1)
Basophils Relative: 1 % (ref 0–1)
EOS PCT: 2 % (ref 0–5)
Eosinophils Absolute: 0.1 10*3/uL (ref 0.0–0.7)
HEMATOCRIT: 36.1 % (ref 36.0–46.0)
HEMOGLOBIN: 12.5 g/dL (ref 12.0–15.0)
LYMPHS ABS: 2.8 10*3/uL (ref 0.7–4.0)
LYMPHS PCT: 53 % — AB (ref 12–46)
MCH: 35.7 pg — ABNORMAL HIGH (ref 26.0–34.0)
MCHC: 34.6 g/dL (ref 30.0–36.0)
MCV: 103.1 fL — AB (ref 78.0–100.0)
MONO ABS: 0.9 10*3/uL (ref 0.1–1.0)
MPV: 11.4 fL (ref 8.6–12.4)
Monocytes Relative: 17 % — ABNORMAL HIGH (ref 3–12)
NEUTROS ABS: 1.4 10*3/uL — AB (ref 1.7–7.7)
Neutrophils Relative %: 27 % — ABNORMAL LOW (ref 43–77)
Platelets: 125 10*3/uL — ABNORMAL LOW (ref 150–400)
RBC: 3.5 MIL/uL — ABNORMAL LOW (ref 3.87–5.11)
RDW: 15.4 % (ref 11.5–15.5)
WBC: 5.3 10*3/uL (ref 4.0–10.5)

## 2015-01-25 LAB — COMPLETE METABOLIC PANEL WITH GFR
ALBUMIN: 2.3 g/dL — AB (ref 3.6–5.1)
ALT: 38 U/L — AB (ref 6–29)
AST: 75 U/L — ABNORMAL HIGH (ref 10–35)
Alkaline Phosphatase: 173 U/L — ABNORMAL HIGH (ref 33–130)
BUN: 9 mg/dL (ref 7–25)
CALCIUM: 8.6 mg/dL (ref 8.6–10.4)
CO2: 27 mmol/L (ref 20–31)
CREATININE: 0.84 mg/dL (ref 0.50–1.05)
Chloride: 107 mmol/L (ref 98–110)
GFR, Est Non African American: 81 mL/min (ref >=60)
Glucose, Bld: 83 mg/dL (ref 65–99)
Potassium: 4.2 mmol/L (ref 3.5–5.3)
Sodium: 138 mmol/L (ref 135–146)
TOTAL PROTEIN: 7.9 g/dL (ref 6.1–8.1)
Total Bilirubin: 2 mg/dL — ABNORMAL HIGH (ref 0.2–1.2)

## 2015-01-25 LAB — POCT URINALYSIS DIP (DEVICE)
GLUCOSE, UA: NEGATIVE mg/dL
Hgb urine dipstick: NEGATIVE
KETONES UR: NEGATIVE mg/dL
Nitrite: POSITIVE — AB
PROTEIN: NEGATIVE mg/dL
Specific Gravity, Urine: >=1.03 (ref 1.005–1.030)
Urobilinogen, UA: >=8 mg/dL (ref 0.0–1.0)
pH: 6 (ref 5.0–8.0)

## 2015-01-25 MED ORDER — TRAMADOL HCL 50 MG PO TABS: 50.0000 mg | ORAL_TABLET | Freq: Four times a day (QID) | ORAL | Status: DC | PRN

## 2015-01-25 MED ORDER — KETOROLAC TROMETHAMINE 60 MG/2ML IM SOLN
60.0000 mg | Freq: Once | INTRAMUSCULAR | Status: AC
  Administered 2015-01-25: 60 mg via INTRAMUSCULAR

## 2015-01-25 MED ORDER — CIPROFLOXACIN HCL 250 MG PO TABS: 250.0000 mg | ORAL_TABLET | Freq: Two times a day (BID) | ORAL | Status: DC
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
## 2015-01-26 LAB — TSH: TSH: 1.288 u[IU]/mL (ref 0.350–4.500)

## 2015-01-26 LAB — HEMOGLOBIN A1C
Hgb A1c MFr Bld: 4.3 % (ref <5.7)
Mean Plasma Glucose: 77 mg/dL (ref <117)

## 2015-01-27 DIAGNOSIS — G8929 Other chronic pain: Secondary | ICD-10-CM | POA: Insufficient documentation

## 2015-01-27 DIAGNOSIS — E669 Obesity, unspecified: Secondary | ICD-10-CM | POA: Insufficient documentation

## 2015-01-27 DIAGNOSIS — M25579 Pain in unspecified ankle and joints of unspecified foot: Secondary | ICD-10-CM

## 2015-01-27 DIAGNOSIS — F32A Depression, unspecified: Secondary | ICD-10-CM | POA: Insufficient documentation

## 2015-01-27 DIAGNOSIS — F329 Major depressive disorder, single episode, unspecified: Secondary | ICD-10-CM | POA: Insufficient documentation

## 2015-01-27 LAB — URINE CULTURE: Colony Count: >=100000

## 2015-01-27 NOTE — Patient Instructions (Signed)
Recommend following with [facility]: M-F 8-3. [address] [place], [place].   Chronic Pain Chronic pain can be defined as pain that is off and on and lasts for 3-6 months or longer. Many things cause chronic pain, which can make it difficult to make a diagnosis. There are many treatment options available for chronic pain. However, finding a treatment that works well for you may require trying various approaches until the right one is found. Many people benefit from a combination of two or more types of treatment to control their pain. SYMPTOMS  Chronic pain can occur anywhere in the body and can range from mild to very severe. Some types of chronic pain include:  Headache.  Low back pain.  Cancer pain.  Arthritis pain.  Neurogenic pain. This is pain resulting from damage to nerves. People with chronic pain may also have other symptoms such as:  Depression.  Anger.  Insomnia.  Anxiety. DIAGNOSIS  Your health care provider will help diagnose your condition over time. In many cases, the initial focus will be on excluding possible conditions that could be causing the pain. Depending on your symptoms, your health care provider may order tests to diagnose your condition. Some of these tests may include:   Blood tests.   CT scan.   MRI.   X-rays.   Ultrasounds.   Nerve conduction studies.  You may need to see a specialist.  TREATMENT  Finding treatment that works well may take time. You may be referred to a pain specialist. He or she may prescribe medicine or therapies, such as:   Mindful meditation or yoga.  Shots (injections) of numbing or pain-relieving medicines into the spine or area of pain.  Local electrical stimulation.  Acupuncture.   Massage therapy.   Aroma, color, light, or sound therapy.   Biofeedback.   Working with a physical therapist to keep from getting stiff.   Regular, gentle exercise.   Cognitive or  behavioral therapy.   Group support.  Sometimes, surgery may be recommended.  HOME CARE INSTRUCTIONS   Take all medicines as directed by your health care provider.   Lessen stress in your life by relaxing and doing things such as listening to calming music.   Exercise or be active as directed by your health care provider.   Eat a healthy diet and include things such as vegetables, fruits, fish, and lean meats in your diet.   Keep all follow-up appointments with your health care provider.   Attend a support group with others suffering from chronic pain. SEEK MEDICAL CARE IF:   Your pain gets worse.   You develop a new pain that was not there before.   You cannot tolerate medicines given to you by your health care provider.   You have new symptoms since your last visit with your health care provider.  SEEK IMMEDIATE MEDICAL CARE IF:   You feel weak.   You have decreased sensation or numbness.   You lose control of bowel or bladder function.   Your pain suddenly gets much worse.   You develop shaking.  You develop chills.  You develop confusion.  You develop chest pain.  You develop shortness of breath.  MAKE SURE YOU:  Understand these instructions.  Will watch your condition.  Will get help right away if you are not doing well or get worse.   This information is not intended to replace advice given to you by your health care provider. Make sure  you discuss any questions you have with your health care provider.   Document Released: 09/14/2001 Document Revised: 08/25/2012 Document Reviewed: 06/18/2012 Elsevier Interactive Patient Education 2016 [employer]. Obesity Obesity is defined as having too much total body fat and a body mass index (BMI) of 30 or more. BMI is an estimate of body fat and is calculated from your height and weight. BMI is typically calculated by your health care provider during regular wellness visits. Obesity happens when  you consume more calories than you can burn by exercising or performing daily physical tasks. Prolonged obesity can cause major illnesses or emergencies, such as:  Stroke.  Heart disease.  Diabetes.  Cancer.  Arthritis.  High blood pressure (hypertension).  High cholesterol.  Sleep apnea.  Erectile dysfunction.  Infertility problems. CAUSES   Regularly eating unhealthy foods.  Physical inactivity.  Certain disorders, such as an underactive thyroid (hypothyroidism), Cushing's syndrome, and polycystic ovarian syndrome.  Certain medicines, such as steroids, some depression medicines, and antipsychotics.  Genetics.  Lack of sleep. DIAGNOSIS A health care provider can diagnose obesity after calculating your BMI. Obesity will be diagnosed if your BMI is 30 or higher. There are other methods of measuring obesity levels. Some other methods include measuring your skinfold thickness, your waist circumference, and comparing your hip circumference to your waist circumference. TREATMENT  A healthy treatment program includes some or all of the following:  Long-term dietary changes.  Exercise and physical activity.  Behavioral and lifestyle changes.  Medicine only under the supervision of your health care provider. Medicines may help, but only if they are used with diet and exercise programs. If your BMI is 40 or higher, your health care provider may recommend specialized surgery or programs to help with weight loss. An unhealthy treatment program includes:  Fasting.  Fad diets.  Supplements and drugs. These choices do not succeed in long-term weight control. HOME CARE INSTRUCTIONS  Exercise and perform physical activity as directed by your health care provider. To increase physical activity, try the following:  Use stairs instead of elevators.  Park farther away from store entrances.  Garden, bike, or walk instead of watching television or using the computer.  Eat  healthy, low-calorie foods and drinks on a regular basis. Eat more fruits and vegetables. Use low-calorie cookbooks or take healthy cooking classes.  Limit fast food, sweets, and processed snack foods.  Eat smaller portions.  Keep a daily journal of everything you eat. There are many free websites to help you with this. It may be helpful to measure your foods so you can determine if you are eating the correct portion sizes.  Avoid drinking alcohol. Drink more water and drinks without calories.  Take vitamins and supplements only as recommended by your health care provider.  Weight-loss support groups, Government social research officer, counselors, and stress reduction education can also be very helpful. SEEK IMMEDIATE MEDICAL CARE IF:  You have chest pain or tightness.  You have trouble breathing or feel short of breath.  You have weakness or leg numbness.  You feel confused or have trouble talking.  You have sudden changes in your vision.   This information is not intended to replace advice given to you by your health care provider. Make sure you discuss any questions you have with your health care provider.   Document Released: 01/31/2004 Document Revised: 01/13/2014 Document Reviewed: 01/29/2011 Elsevier Interactive Patient Education [employer]

## 2015-01-28 LAB — VITAMIN D 1,25 DIHYDROXY
Vitamin D 1, 25 (OH)2 Total: 33 pg/mL (ref 18–72)
Vitamin D3 1, 25 (OH)2: 33 pg/mL

## 2015-01-29 ENCOUNTER — Telehealth: Payer: Self-pay

## 2015-01-29 NOTE — Telephone Encounter (Signed)
-----   Message from [name], [place] sent at 01/29/2015  7:11 AM EST ----- Regarding: lab results Reviewed urine culture; yielded E.coli. Please call patient to ensure that she picked up antibiotic at pharmacy.   Thanks ----- Message -----    From: Lab in [facility] Interface    Sent: 01/26/2015   3:39 AM      To: [name], FNP

## 2015-01-29 NOTE — Telephone Encounter (Signed)
Called, no answer. Left message for patient to return call. Thanks!  

## 2015-01-31 NOTE — Telephone Encounter (Signed)
Called and spoke with patient, advised of urine culture results and that patient needed to take cipro as prescribe until gone. Patient verbalized understanding and states she will complete the antibiotic course. Thanks!

## 2015-02-09 ENCOUNTER — Telehealth (HOSPITAL_COMMUNITY): Payer: Self-pay | Admitting: Orthopedic Surgery

## 2015-02-09 ENCOUNTER — Encounter: Payer: Self-pay | Admitting: Family Medicine

## 2015-02-09 ENCOUNTER — Ambulatory Visit (INDEPENDENT_AMBULATORY_CARE_PROVIDER_SITE_OTHER): Payer: Self-pay | Admitting: Family Medicine

## 2015-02-09 VITALS — BP 117/85 | Ht 60.0 in | Wt 200.0 lb

## 2015-02-09 DIAGNOSIS — G8929 Other chronic pain: Secondary | ICD-10-CM

## 2015-02-09 DIAGNOSIS — M25572 Pain in left ankle and joints of left foot: Secondary | ICD-10-CM

## 2015-02-09 MED ORDER — METHYLPREDNISOLONE ACETATE 40 MG/ML IJ SUSP
40.0000 mg | Freq: Once | INTRAMUSCULAR | Status: AC
  Administered 2015-02-09: 40 mg via INTRA_ARTICULAR
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
## 2015-02-09 NOTE — Assessment & Plan Note (Signed)
Left ankle with moderate tibiotalar arthritis as well as most likely retained painful hardware. Long discussion today that the issue for her is to discuss this with a foot and ankle surgeon for possible removal of hardware and eventual need for ankle fusion. She would not like to proceed with this option at this time. -Ankle injection today under ultrasound guidance. She will follow-up as needed.   Aspiration/Injection Procedure Note Linda Vaughn 07/27/1965  Procedure: Injection Indications: L tibiotalar joint   Procedure Details Consent: Risks of procedure as well as the alternatives and risks of each were explained to the (patient/caregiver).  Consent for procedure obtained. Time Out: Verified patient identification, verified procedure, site/side was marked, verified correct patient position, special equipment/implants available, medications/allergies/relevent history reviewed, required imaging and test results available.  Performed.  The area was cleaned with iodine and alcohol swabs.    The L tibiotalar joint was injected using 1 cc's of  Depomedrol and 1 cc's of 1% lidocaine with a 21 1 1/2" needle.  Ultrasound was used. Images were obtained in Transverse and Long views showing the injection.    A sterile dressing was applied.  Patient did tolerate procedure well. Estimated blood loss: None

## 2015-02-23 ENCOUNTER — Ambulatory Visit: Payer: Self-pay | Admitting: Family Medicine

## 2015-03-02 ENCOUNTER — Ambulatory Visit: Payer: Self-pay | Admitting: Family Medicine

## 2015-03-09 ENCOUNTER — Ambulatory Visit: Payer: Self-pay | Admitting: Family Medicine

## 2015-03-19 ENCOUNTER — Emergency Department (HOSPITAL_COMMUNITY): Payer: Medicaid Other

## 2015-03-19 ENCOUNTER — Emergency Department (HOSPITAL_COMMUNITY)
Admission: EM | Admit: 2015-03-19 | Discharge: 2015-03-19 | Disposition: A | Discharge status: Home / Self Care | Payer: Medicaid Other | Attending: Emergency Medicine | Admitting: Emergency Medicine

## 2015-03-19 ENCOUNTER — Encounter (HOSPITAL_COMMUNITY): Payer: Self-pay | Admitting: Family Medicine

## 2015-03-19 DIAGNOSIS — Z87891 Personal history of nicotine dependence: Secondary | ICD-10-CM | POA: Insufficient documentation

## 2015-03-19 DIAGNOSIS — R6883 Chills (without fever): Secondary | ICD-10-CM | POA: Diagnosis not present

## 2015-03-19 DIAGNOSIS — R197 Diarrhea, unspecified: Secondary | ICD-10-CM | POA: Insufficient documentation

## 2015-03-19 DIAGNOSIS — R188 Other ascites: Secondary | ICD-10-CM | POA: Insufficient documentation

## 2015-03-19 DIAGNOSIS — M1990 Unspecified osteoarthritis, unspecified site: Secondary | ICD-10-CM | POA: Insufficient documentation

## 2015-03-19 DIAGNOSIS — R109 Unspecified abdominal pain: Secondary | ICD-10-CM | POA: Diagnosis present

## 2015-03-19 DIAGNOSIS — R112 Nausea with vomiting, unspecified: Secondary | ICD-10-CM | POA: Diagnosis not present

## 2015-03-19 DIAGNOSIS — K439 Ventral hernia without obstruction or gangrene: Secondary | ICD-10-CM | POA: Diagnosis not present

## 2015-03-19 LAB — CBC
HEMATOCRIT: 38.5 % (ref 36.0–46.0)
Hemoglobin: 13.1 g/dL (ref 12.0–15.0)
MCH: 35.2 pg — ABNORMAL HIGH (ref 26.0–34.0)
MCHC: 34 g/dL (ref 30.0–36.0)
MCV: 103.5 fL — ABNORMAL HIGH (ref 78.0–100.0)
PLATELETS: 117 10*3/uL — AB (ref 150–400)
RBC: 3.72 MIL/uL — ABNORMAL LOW (ref 3.87–5.11)
RDW: 15 % (ref 11.5–15.5)
WBC: 4.5 10*3/uL (ref 4.0–10.5)

## 2015-03-19 LAB — COMPREHENSIVE METABOLIC PANEL
ALBUMIN: 2.2 g/dL — AB (ref 3.5–5.0)
ALK PHOS: 192 U/L — AB (ref 38–126)
ALT: 41 U/L (ref 14–54)
AST: 77 U/L — AB (ref 15–41)
Anion gap: 9 (ref 5–15)
BILIRUBIN TOTAL: 3.1 mg/dL — AB (ref 0.3–1.2)
BUN: 7 mg/dL (ref 6–20)
CALCIUM: 8.4 mg/dL — AB (ref 8.9–10.3)
CO2: 26 mmol/L (ref 22–32)
CREATININE: 0.8 mg/dL (ref 0.44–1.00)
Chloride: 107 mmol/L (ref 101–111)
GFR calc Af Amer: >60 mL/min (ref >60)
GLUCOSE: 102 mg/dL — AB (ref 65–99)
Potassium: 4.1 mmol/L (ref 3.5–5.1)
Sodium: 142 mmol/L (ref 135–145)
TOTAL PROTEIN: 7 g/dL (ref 6.5–8.1)

## 2015-03-19 LAB — URINALYSIS, ROUTINE W REFLEX MICROSCOPIC
GLUCOSE, UA: NEGATIVE mg/dL
Hgb urine dipstick: NEGATIVE
KETONES UR: NEGATIVE mg/dL
NITRITE: POSITIVE — AB
PROTEIN: NEGATIVE mg/dL
Specific Gravity, Urine: 1.015 (ref 1.005–1.030)
pH: 6 (ref 5.0–8.0)

## 2015-03-19 LAB — URINE MICROSCOPIC-ADD ON
RBC / HPF: NONE SEEN RBC/hpf (ref 0–5)
RBC / HPF: NONE SEEN RBC/hpf (ref 0–5)

## 2015-03-19 LAB — LIPASE, BLOOD: Lipase: 24 U/L (ref 11–51)

## 2015-03-19 MED ORDER — MORPHINE SULFATE (PF) 4 MG/ML IV SOLN
6.0000 mg | Freq: Once | INTRAVENOUS | Status: AC
Start: 2015-03-19 — End: 2015-03-19
  Administered 2015-03-19: 6 mg via INTRAVENOUS
  Filled 2015-03-19: qty 2

## 2015-03-19 MED ORDER — SODIUM CHLORIDE 0.9 % IV BOLUS (SEPSIS)
2000.0000 mL | Freq: Once | INTRAVENOUS | Status: AC
  Administered 2015-03-19: 2000 mL via INTRAVENOUS

## 2015-03-19 MED ORDER — IOHEXOL 300 MG/ML  SOLN
100.0000 mL | Freq: Once | INTRAMUSCULAR | Status: AC | PRN
  Administered 2015-03-19: 100 mL via INTRAVENOUS

## 2015-03-19 MED ORDER — OXYCODONE HCL 5 MG PO TABS
5.0000 mg | ORAL_TABLET | Freq: Four times a day (QID) | ORAL | Status: DC | PRN
Start: 2015-03-19 — End: 2015-05-30

## 2015-03-19 MED ORDER — METRONIDAZOLE 500 MG PO TABS
2000.0000 mg | ORAL_TABLET | Freq: Once | ORAL | Status: AC
  Administered 2015-03-19: 2000 mg via ORAL
  Filled 2015-03-19: qty 4

## 2015-03-19 MED ORDER — METOCLOPRAMIDE HCL 10 MG PO TABS: 10.0000 mg | ORAL_TABLET | Freq: Four times a day (QID) | ORAL | Status: DC | PRN

## 2015-03-19 MED ORDER — METOCLOPRAMIDE HCL 5 MG/ML IJ SOLN
5.0000 mg | Freq: Once | INTRAMUSCULAR | Status: AC
  Administered 2015-03-19: 5 mg via INTRAVENOUS
  Filled 2015-03-19: qty 2

## 2015-03-19 NOTE — Discharge Instructions (Signed)
Keep your scheduled appointment with the sickle cell clinic on 04/04/2015 at 9 AM. Call [facility] [place] to arrange to get your hernia repaired when convenient. Take the pain medicine prescribed as needed. Take Imodium as directed for diarrhea. Avoid milk or foods containing milk such as cheese or ice cream while having diarrhea. Avoid alcohol as our CT scan shows that you have liver damage Hernia A hernia happens when an organ or tissue inside your body pushes out through a weak spot in the belly (abdomen). HOME CARE  Avoid stretching or overusing (straining) the muscles near the hernia.  Do not lift anything heavier than 10 lb (4.5 kg).  Use the muscles in your leg when you lift something up. Do not use the muscles in your back.  When you cough, try to cough gently.  Eat a diet that has a lot of fiber. Eat lots of fruits and vegetables.  Drink enough fluids to keep your pee (urine) clear or pale yellow. Try to drink 6-8 glasses of water a day.  Take medicines to make your poop soft (stool softeners) as told by your doctor.  Lose weight, if you are overweight.  Do not use any tobacco products, including cigarettes, chewing tobacco, or electronic cigarettes. If you need help quitting, ask your doctor.  Keep all follow-up visits as told by your doctor. This is important. GET HELP IF:  The skin by the hernia gets puffy (swollen) or red.  The hernia is painful. GET HELP RIGHT AWAY IF:  You have a fever.  You have belly pain that is getting worse.  You feel sick to your stomach (nauseous) or you throw up (vomit).  You cannot push the hernia back in place by gently pressing on it while you are lying down.  The hernia:  Changes in shape or size.  Is stuck outside your belly.  Changes color.  Feels hard or tender.   This information is not intended to replace advice given to you by your health care provider. Make sure you discuss any questions you have with your  health care provider.   Document Released: 06/12/2009 Document Revised: 01/13/2014 Document Reviewed: 11/02/2013 Elsevier Interactive Patient Education [employer]2016 Elsevier Inc.

## 2015-03-19 NOTE — ED Notes (Signed)
Patient returned from Ct. Patient asked for urine sample. she states she is unable to give urine sample at this time.

## 2015-03-19 NOTE — Care Management Note (Signed)
Case Management Note  Patient Details  Name: Linda Vaughn MRN: [id number] Date of Birth: [birth year]  Subjective/Objective:                  50 yo female Complains of abdominal pain and swelling onset 2 weeks ago becoming acutely worse last night.// Home alone.  Action/Plan: Follow for disposition needs.   Expected Discharge Date:       03/19/15           Expected Discharge Plan:  Home/Self Care  In-House Referral:  NA  Discharge planning Services  CM Consult, Follow-up appt scheduled  Post Acute Care Choice:  NA Choice offered to:  Patient  DME Arranged:  N/A DME Agency:  NA  HH Arranged:  NA HH Agency:  NA  Status of Service:  Completed, signed off  Medicare Important Message Given:    Date Medicare IM Given:    Medicare IM give by:    Date Additional Medicare IM Given:    Additional Medicare Important Message give by:     If discussed at [place] of Stay Meetings, dates discussed:    Additional Comments: Linda Vaughn J. Clydene Laming, RN, BSN, [place] [phone] ER CM consulted regarding PCP establishment and insurance enrollment. Pt presented to [facility] ER today with abdominal pain. NCM met with pt at bedside; pt confirms not having access to f/u care with PCP or insurance coverage. Discussed with patient importance and benefits of establishing PCP, and not utilizing the ER for primary care needs. Pt verbalized understanding and is in agreement.  NCM advised that Internal Medicine providers at [facility] are accepting new pts that are uninsured. Pt verbalized understanding and asked to have appointment arranged. NCM set up appointment at [facility] with Cammie Sickle, NP on 3/29 at 0900.  NCM informed pt they may visit [place] [facility] [place] for Rx needs upon discharge.  NCM provided business card of [facility], [place] [facility] and instructed to call with question or concerns pertaining [facility]/orange card process.  Pt verbalized  understanding.  Fuller Mandril, RN 03/19/2015, 3:58 PM

## 2015-03-19 NOTE — ED Notes (Signed)
Patient transported to CT 

## 2015-03-19 NOTE — ED Provider Notes (Addendum)
CSN: [id number]     Arrival date & time 03/19/15  1003 History   First MD Initiated Contact with Patient 03/19/15 1145     Chief Complaint  Patient presents with  . Abdominal Pain     (Consider location/radiation/quality/duration/timing/severity/associated sxs/prior Treatment) HPI Complains of abdominal pain and swelling onset 2 weeks ago becoming acutely worse last night. She's had 10 episodes of vomiting though denies nausea at present. No hematemesis she also reports a approximately 5 episodes of diarrhea since last night. She reports a few drops of blood mixed in with diarrhea. Other associated symptoms include chills. She is uncertain if she's had fever. No treatment prior to coming here. Nothing makes pain better or worse. Pain is diffusely across her abdomen for most pronounced immediately superior to the umbilicus at the site of a "knot". Past Medical History  Diagnosis Date  . Arthritis   . Gallstones    Past Surgical History  Procedure Laterality Date  . Ankle surgery    . Carpal tunnel release    . Ankle surgery    . Cesarean section    . Cholecystectomy     Family History  Problem Relation Age of Onset  . Cancer Mother   . Rheum arthritis Father   . Diabetes Other    Social History  Substance Use Topics  . Smoking status: Former Smoker -- 0.50 packs/day  . Smokeless tobacco: Never Used  . Alcohol Use: No     Comment: occ   OB History    No data available     Review of Systems  Constitutional: Positive for chills.  Gastrointestinal: Positive for nausea, vomiting, abdominal pain, diarrhea and abdominal distention.  All other systems reviewed and are negative.     Allergies  Aspirin; Ibuprofen; and Tylenol  Home Medications   Prior to Admission medications   Medication Sig Start Date End Date Taking? Authorizing Provider  oxyCODONE-acetaminophen (PERCOCET/ROXICET) 5-325 MG tablet Take 1 tablet by mouth every 8 (eight) hours as needed for severe pain.  01/09/15  Yes Courteney Lyn Mackuen, MD  ciprofloxacin (CIPRO) 250 MG tablet Take 1 tablet (250 mg total) by mouth 2 (two) times daily. Patient not taking: Reported on 03/19/2015 01/25/15   Massie MaroonLachina M Hollis, FNP  traMADol (ULTRAM) 50 MG tablet Take 1 tablet (50 mg total) by mouth every 6 (six) hours as needed. Patient not taking: Reported on 03/19/2015 01/25/15   Massie MaroonLachina M Hollis, FNP   BP 133/94 mmHg  Pulse 76  Temp(Src) 98.4 F (36.9 C) (Oral)  Resp 18  Ht 5' (1.524 m)  Wt 200 lb (90.719 kg)  BMI 39.06 kg/m2  SpO2 100% Physical Exam  Constitutional: She appears well-developed and well-nourished. She appears distressed.  Appears uncomfortable  HENT:  Head: Normocephalic and atraumatic.  Eyes: Conjunctivae are normal. Pupils are equal, round, and reactive to light.  Neck: Neck supple. No tracheal deviation present. No thyromegaly present.  Cardiovascular: Normal rate and regular rhythm.   No murmur heard. Pulmonary/Chest: Effort normal and breath sounds normal.  Abdominal: Soft. Bowel sounds are normal. She exhibits distension. There is tenderness.  Diffusely tender. There is a golf ball sized mass superior to the umbilicus likely a hernia. Not red or warm. Exquisitely tender.  Musculoskeletal: Normal range of motion. She exhibits no edema or tenderness.  Neurological: She is alert. Coordination normal.  Skin: Skin is warm and dry. No rash noted.  Psychiatric: She has a normal mood and affect.  Nursing note and vitals reviewed.  ED Course  Procedures (including critical care time) Labs Review Labs Reviewed  CBC - Abnormal; Notable for the following:    RBC 3.72 (*)    MCV 103.5 (*)    MCH 35.2 (*)    All other components within normal limits  LIPASE, BLOOD  COMPREHENSIVE METABOLIC PANEL  URINALYSIS, ROUTINE W REFLEX MICROSCOPIC (NOT AT [facility])    Imaging Review No results found. I have personally reviewed and evaluated these images and lab results as part of my medical  decision-making.   EKG Interpretation None     4:10 PM patient feels improved she is able to drink without difficulty and feels ready go home Results for orders placed or performed during the hospital encounter of 03/19/15  Lipase, blood  Result Value Ref Range   Lipase 24 11 - 51 U/L  Comprehensive metabolic panel  Result Value Ref Range   Sodium 142 135 - 145 mmol/L   Potassium 4.1 3.5 - 5.1 mmol/L   Chloride 107 101 - 111 mmol/L   CO2 26 22 - 32 mmol/L   Glucose, Bld 102 (H) 65 - 99 mg/dL   BUN 7 6 - 20 mg/dL   Creatinine, Ser 9.14 0.44 - 1.00 mg/dL   Calcium 8.4 (L) 8.9 - 10.3 mg/dL   Total Protein 7.0 6.5 - 8.1 g/dL   Albumin 2.2 (L) 3.5 - 5.0 g/dL   AST 77 (H) 15 - 41 U/L   ALT 41 14 - 54 U/L   Alkaline Phosphatase 192 (H) 38 - 126 U/L   Total Bilirubin 3.1 (H) 0.3 - 1.2 mg/dL   GFR calc non Af Amer >60 >60 mL/min   GFR calc Af Amer >60 >60 mL/min   Anion gap 9 5 - 15  CBC  Result Value Ref Range   WBC 4.5 4.0 - 10.5 K/uL   RBC 3.72 (L) 3.87 - 5.11 MIL/uL   Hemoglobin 13.1 12.0 - 15.0 g/dL   HCT 78.2 95.6 - 21.3 %   MCV 103.5 (H) 78.0 - 100.0 fL   MCH 35.2 (H) 26.0 - 34.0 pg   MCHC 34.0 30.0 - 36.0 g/dL   RDW 08.6 57.8 - 46.9 %   Platelets 117 (L) 150 - 400 K/uL   Ct Abdomen Pelvis W Contrast  03/19/2015  ADDENDUM REPORT: 03/19/2015 15:32 ADDENDUM: There is a sub-cm periumbilical hernia defect associated with a 3.3 cm pocket of fluid in the subcutaneous soft tissues, likely ascites fluid that has extended through the tiny hernia. There is no involvement of mesenteric fat or bowel identified. Electronically Signed   By: Esperanza Heir M.D.   On: 03/19/2015 15:32  03/19/2015  CLINICAL DATA:  Palpable abdominal mass with nausea vomiting and diarrhea for 1 week EXAM: CT ABDOMEN AND PELVIS WITH CONTRAST TECHNIQUE: Multidetector CT imaging of the abdomen and pelvis was performed using the standard protocol following bolus administration of intravenous contrast. CONTRAST:   OMNIPAQUE IOHEXOL 300 MG/ML  SOLN COMPARISON:  08/23/2014 FINDINGS: Lower chest:  No pleural effusion.  Lung bases clear. Hepatobiliary: The liver is relatively small and demonstrates a nodular contour. There are no focal hepatic abnormalities. The patient is status post cholecystectomy. Pancreas: Normal Spleen: Normal Adrenals/Urinary Tract: Negative. Some contrast opacified urine is seen admixed with un opacified urine within the bladder Stomach/Bowel: Nonobstructive bowel gas pattern. Mild diffuse small bowel wall thickening likely related to diffuse edematous state. Appendix is normal. Vascular/Lymphatic: Mild aortoiliac calcification. No significant adenopathy. Portal vein appears to be patent.  Reproductive: Negative Other: There is a small to moderate volume of ascites in the abdomen and pelvis. There is mesenteric edema. There is mild body wall edema. Musculoskeletal: Multiple anterolateral old left rib fractures. No acute musculoskeletal findings. 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 Electronically Signed: By: Esperanza Heir M.D. On: 03/19/2015 14:31    MDM  No signs of incarceration of hernia Plan prescription oxycodone, Reglan. An appointment has been scheduled for her [facility] for 04/04/2015 at 9 AM. She also be referred to [facility] [place] [facility] for elective hernia repair Final diagnoses:  None   Dx #1 nausea vomiting diarrhea #2 ventral hernia #3 acscites      Doug Sou, MD 03/19/15 1638  Addendum urinalysis returned, positive for trichomonas . Patient treated with Flagyl 2 g orally prior to discharge. GC and Chlamydia assays will be added to urine obtained and she's been advised to check for other STDs when going to the sickle cell clinic March 29  Doug Sou, MD 03/19/15 1644

## 2015-03-19 NOTE — ED Notes (Signed)
Pt stable, ambulatory, states understanding of discharge instructions 

## 2015-03-19 NOTE — ED Notes (Signed)
Pt here for severe abd pain and knot in abdomen above umbilicus. Upon palpation pt jumped in pain. Knot palpated. sts that she has been having N,V,D and feels like she's going to pass out.

## 2015-03-20 LAB — GC/CHLAMYDIA PROBE AMP (~~LOC~~) NOT AT ARMC
Chlamydia: NEGATIVE
NEISSERIA GONORRHEA: NEGATIVE

## 2015-03-23 ENCOUNTER — Ambulatory Visit: Payer: Self-pay | Admitting: Family Medicine

## 2015-04-04 ENCOUNTER — Ambulatory Visit (INDEPENDENT_AMBULATORY_CARE_PROVIDER_SITE_OTHER): Payer: Self-pay | Admitting: Family Medicine

## 2015-04-04 ENCOUNTER — Encounter: Payer: Self-pay | Admitting: Family Medicine

## 2015-04-04 VITALS — BP 135/85 | HR 74 | Temp 98.2°F | Resp 16 | Ht 60.0 in | Wt 204.0 lb

## 2015-04-04 DIAGNOSIS — K429 Umbilical hernia without obstruction or gangrene: Secondary | ICD-10-CM | POA: Insufficient documentation

## 2015-04-04 DIAGNOSIS — F17200 Nicotine dependence, unspecified, uncomplicated: Secondary | ICD-10-CM

## 2015-04-04 NOTE — Patient Instructions (Signed)
Hernia, Adult A hernia is the bulging of an organ or tissue through a weak spot in the muscles of the abdomen (abdominal wall). Hernias develop most often near the navel or groin. There are many kinds of hernias. Common kinds include:  Femoral hernia. This kind of hernia develops under the groin in the upper thigh area.  Inguinal hernia. This kind of hernia develops in the groin or scrotum.  Umbilical hernia. This kind of hernia develops near the navel.  Hiatal hernia. This kind of hernia causes part of the stomach to be pushed up into the chest.  Incisional hernia. This kind of hernia bulges through a scar from an abdominal surgery. CAUSES This condition may be caused by:  Heavy lifting.  Coughing over a long period of time.  Straining to have a bowel movement.  An incision made during an abdominal surgery.  A birth defect (congenital defect).  Excess weight or obesity.  Smoking.  Poor nutrition.  Cystic fibrosis.  Excess fluid in the abdomen.  Undescended testicles. SYMPTOMS Symptoms of a hernia include:  A lump on the abdomen. This is the first sign of a hernia. The lump may become more obvious with standing, straining, or coughing. It may get bigger over time if it is not treated or if the condition causing it is not treated.  Pain. A hernia is usually painless, but it may become painful over time if treatment is delayed. The pain is usually dull and may get worse with standing or lifting heavy objects. Sometimes a hernia gets tightly squeezed in the weak spot (strangulated) or stuck there (incarcerated) and causes additional symptoms. These symptoms may include:  Vomiting.  Nausea.  Constipation.  Irritability. DIAGNOSIS A hernia may be diagnosed with:  A physical exam. During the exam your health care provider may ask you to cough or to make a specific movement, because a hernia is usually more visible when you move.  Imaging tests. These can  include:  X-rays.  Ultrasound.  CT scan. TREATMENT A hernia that is small and painless may not need to be treated. A hernia that is large or painful may be treated with surgery. Inguinal hernias may be treated with surgery to prevent incarceration or strangulation. Strangulated hernias are always treated with surgery, because lack of blood to the trapped organ or tissue can cause it to die. Surgery to treat a hernia involves pushing the bulge back into place and repairing the weak part of the abdomen. HOME CARE INSTRUCTIONS  Avoid straining.  Do not lift anything heavier than 10 lb (4.5 kg).  Lift with your leg muscles, not your back muscles. This helps avoid strain.  When coughing, try to cough gently.  Prevent constipation. Constipation leads to straining with bowel movements, which can make a hernia worse or cause a hernia repair to break down. You can prevent constipation by:  Eating a high-fiber diet that includes plenty of fruits and vegetables.  Drinking enough fluids to keep your urine clear or pale yellow. Aim to drink 6-8 glasses of water per day.  Using a stool softener as directed by your health care provider.  Lose weight, if you are overweight.  Do not use any tobacco products, including cigarettes, chewing tobacco, or electronic cigarettes. If you need help quitting, ask your health care provider.  Keep all follow-up visits as directed by your health care provider. This is important. Your health care provider may need to monitor your condition. SEEK MEDICAL CARE IF:  You have   swelling, redness, and pain in the affected area.  Your bowel habits change. SEEK IMMEDIATE MEDICAL CARE IF:  You have a fever.  You have abdominal pain that is getting worse.  You feel nauseous or you vomit.  You cannot push the hernia back in place by gently pressing on it while you are lying down.  The hernia:  Changes in shape or size.  Is stuck outside the  abdomen.  Becomes discolored.  Feels hard or tender.   This information is not intended to replace advice given to you by your health care provider. Make sure you discuss any questions you have with your health care provider.   Document Released: 12/23/2004 Document Revised: 01/13/2014 Document Reviewed: 11/02/2013 Elsevier Interactive Patient Education 2016 Elsevier Inc.  

## 2015-04-04 NOTE — Progress Notes (Signed)
Subjective:    Patient ID: Linda Vaughn, female    DOB: [birth year], 50 y.o.   MRN: [id number]  HPI Linda Vaughn, a 50 year old female that presents to complaining of an umbilical hernia. Patient was evaluated in the emergency department on 03/19/2015 after experiencing abdominal pain. She was found to have a periumbilical hernia defect associated with a 3.3 cm pocket of fluid in the subcutaneous soft tissues. She states that pain often increases after eating large meals. Patient complains of abdominal pain. Pain is 2/10 described as intermittent.  The pain is located in the periumbilical area.  Onset was several months ago. Symptoms have been unchanged since that time. Aggravating factors include eating large meals. Associated symptoms include bloating and abdominal distention. The patient denies fever, fatigue, dysuria, nausea, vomiting, or constipation.   Past Medical History  Diagnosis Date  . Arthritis   . Gallstones   . Hypertension    Past Surgical History  Procedure Laterality Date  . Ankle surgery    . Carpal tunnel release    . Ankle surgery    . Cesarean section    . Cholecystectomy     Social History   Social History  . Marital Status: Single    Spouse Name: N/A  . Number of Children: N/A  . Years of Education: N/A   Occupational History  . Not on file.   Social History Main Topics  . Smoking status: Former Smoker -- 0.50 packs/day  . Smokeless tobacco: Never Used  . Alcohol Use: No     Comment: occ  . Drug Use: No  . Sexual Activity: Not on file   Other Topics Concern  . Not on file   Social History Narrative   There is no immunization history on file for this patient.  Allergies  Allergen Reactions  . Aspirin Other (See Comments)    Liver damage   . Ibuprofen Other (See Comments)    Liver damage  . Tylenol [Acetaminophen] Other (See Comments)    Liver damage   Review of Systems  Constitutional: Negative.  Negative for fever, fatigue and  unexpected weight change.  HENT: Negative.   Eyes: Negative.  Negative for visual disturbance.  Respiratory: Negative.   Cardiovascular: Negative.   Gastrointestinal: Positive for abdominal pain and abdominal distention.  Endocrine: Negative.  Negative for polydipsia, polyphagia and polyuria.  Genitourinary: Negative.  Negative for dysuria, urgency and difficulty urinating.  Musculoskeletal: Negative.   Skin: Negative.   Allergic/Immunologic: Negative for immunocompromised state.  Neurological: Negative.   Hematological: Negative.   Psychiatric/Behavioral: Negative.        Objective:   Physical Exam  Constitutional: She is oriented to person, place, and time. She appears well-developed and well-nourished.  HENT:  Head: Normocephalic and atraumatic.  Right Ear: External ear normal.  Left Ear: External ear normal.  Nose: Nose normal.  Mouth/Throat: Oropharynx is clear and moist.  Eyes: Conjunctivae and EOM are normal. Pupils are equal, round, and reactive to light.  Neck: Normal range of motion. Neck supple.  Cardiovascular: Normal rate, regular rhythm and normal heart sounds.   Pulmonary/Chest: Effort normal and breath sounds normal.  Abdominal: Soft. Bowel sounds are normal. There is tenderness in the periumbilical area. A hernia is present.  Musculoskeletal: Normal range of motion.  Neurological: She is alert and oriented to person, place, and time. She has normal reflexes.  Skin: Skin is warm and dry.  Psychiatric: She has a normal mood and affect. Her  speech is normal and behavior is normal. Judgment and thought content normal.     BP 135/85 mmHg  Pulse 74  Temp(Src) 98.2 F (36.8 C) (Oral)  Resp 16  Ht 5' (1.524 Vaughn)  Wt 204 lb (92.534 kg)  BMI 39.84 kg/m2 Assessment & Plan:  1. Umbilical hernia without obstruction and without gangrene Reviewed hospital notes and CT of the abdomen and pelvis. There is a sub-cm periumbilical hernia defect asociated with a 3.3 cm pocket  of fluid in the subcutaneous soft tissues, likely ascites fluid that has extended through the hernia. Patient most likely does not require surgery at this point due to the size of hernia. Will follow up in clinic as needed.  Recommend a lowfat, low carbohydrate diet divided over 5-6 small meals, increase water intake to 6-8 glasses, and 150 minutes per week of cardiovascular exercise.    2. Tobacco dependence Smoking cessation instruction/counseling given:  counseled patient on the dangers of tobacco use, advised patient to stop smoking, and reviewed strategies to maximize success    RTC: Patient to schedule follow up appointment after surgery  Linda Kipnis M, FNP  The patient was given clear instructions to go to ER or return to medical center if symptoms do not improve, worsen or new problems develop. The patient verbalized understanding. Will notify patient with laboratory results.

## 2015-05-07 ENCOUNTER — Emergency Department (HOSPITAL_COMMUNITY): Payer: Medicaid Other

## 2015-05-07 ENCOUNTER — Encounter (HOSPITAL_COMMUNITY): Payer: Self-pay | Admitting: Emergency Medicine

## 2015-05-07 ENCOUNTER — Emergency Department (HOSPITAL_COMMUNITY)
Admission: EM | Admit: 2015-05-07 | Discharge: 2015-05-07 | Disposition: A | Discharge status: Home / Self Care | Payer: Medicaid Other | Attending: Emergency Medicine | Admitting: Emergency Medicine

## 2015-05-07 DIAGNOSIS — Z8739 Personal history of other diseases of the musculoskeletal system and connective tissue: Secondary | ICD-10-CM | POA: Diagnosis not present

## 2015-05-07 DIAGNOSIS — I10 Essential (primary) hypertension: Secondary | ICD-10-CM | POA: Insufficient documentation

## 2015-05-07 DIAGNOSIS — R109 Unspecified abdominal pain: Secondary | ICD-10-CM | POA: Diagnosis present

## 2015-05-07 DIAGNOSIS — F17200 Nicotine dependence, unspecified, uncomplicated: Secondary | ICD-10-CM | POA: Diagnosis not present

## 2015-05-07 DIAGNOSIS — R188 Other ascites: Secondary | ICD-10-CM

## 2015-05-07 DIAGNOSIS — R1084 Generalized abdominal pain: Secondary | ICD-10-CM

## 2015-05-07 DIAGNOSIS — K7031 Alcoholic cirrhosis of liver with ascites: Secondary | ICD-10-CM | POA: Insufficient documentation

## 2015-05-07 DIAGNOSIS — K7460 Unspecified cirrhosis of liver: Secondary | ICD-10-CM

## 2015-05-07 LAB — PROTIME-INR
INR: 1.54 — AB (ref 0.00–1.49)
PROTHROMBIN TIME: 18.6 s — AB (ref 11.6–15.2)

## 2015-05-07 LAB — URINE MICROSCOPIC-ADD ON

## 2015-05-07 LAB — URINALYSIS, ROUTINE W REFLEX MICROSCOPIC
BILIRUBIN URINE: NEGATIVE
Glucose, UA: NEGATIVE mg/dL
HGB URINE DIPSTICK: NEGATIVE
KETONES UR: NEGATIVE mg/dL
NITRITE: POSITIVE — AB
PH: 6.5 (ref 5.0–8.0)
Protein, ur: NEGATIVE mg/dL
Specific Gravity, Urine: 1.03 (ref 1.005–1.030)

## 2015-05-07 LAB — CBC WITH DIFFERENTIAL/PLATELET
BASOS PCT: 1 %
Basophils Absolute: 0 10*3/uL (ref 0.0–0.1)
EOS ABS: 0.1 10*3/uL (ref 0.0–0.7)
EOS PCT: 2 %
HCT: 34.7 % — ABNORMAL LOW (ref 36.0–46.0)
Hemoglobin: 12.3 g/dL (ref 12.0–15.0)
LYMPHS ABS: 2 10*3/uL (ref 0.7–4.0)
Lymphocytes Relative: 45 %
MCH: 36.4 pg — AB (ref 26.0–34.0)
MCHC: 35.4 g/dL (ref 30.0–36.0)
MCV: 102.7 fL — ABNORMAL HIGH (ref 78.0–100.0)
MONOS PCT: 21 %
Monocytes Absolute: 0.9 10*3/uL (ref 0.1–1.0)
Neutro Abs: 1.4 10*3/uL — ABNORMAL LOW (ref 1.7–7.7)
Neutrophils Relative %: 31 %
PLATELETS: 103 10*3/uL — AB (ref 150–400)
RBC: 3.38 MIL/uL — ABNORMAL LOW (ref 3.87–5.11)
RDW: 14.7 % (ref 11.5–15.5)
WBC: 4.4 10*3/uL (ref 4.0–10.5)

## 2015-05-07 LAB — BASIC METABOLIC PANEL
Anion gap: 7 (ref 5–15)
BUN: 9 mg/dL (ref 6–20)
CALCIUM: 8.1 mg/dL — AB (ref 8.9–10.3)
CO2: 22 mmol/L (ref 22–32)
CREATININE: 0.7 mg/dL (ref 0.44–1.00)
Chloride: 109 mmol/L (ref 101–111)
GFR calc non Af Amer: >60 mL/min (ref >60)
Glucose, Bld: 88 mg/dL (ref 65–99)
Potassium: 3.8 mmol/L (ref 3.5–5.1)
SODIUM: 138 mmol/L (ref 135–145)

## 2015-05-07 LAB — TYPE AND SCREEN
ABO/RH(D): O POS
Antibody Screen: NEGATIVE

## 2015-05-07 LAB — I-STAT CG4 LACTIC ACID, ED: Lactic Acid, Venous: 1.12 mmol/L (ref 0.5–2.0)

## 2015-05-07 LAB — ABO/RH: ABO/RH(D): O POS

## 2015-05-07 MED ORDER — IOPAMIDOL (ISOVUE-300) INJECTION 61%
INTRAVENOUS | Status: AC
  Administered 2015-05-07: 100 mL
  Filled 2015-05-07: qty 100

## 2015-05-07 MED ORDER — ONDANSETRON HCL 4 MG/2ML IJ SOLN
4.0000 mg | Freq: Once | INTRAMUSCULAR | Status: AC
  Administered 2015-05-07: 4 mg via INTRAVENOUS
  Filled 2015-05-07: qty 2

## 2015-05-07 MED ORDER — HYDROMORPHONE HCL 1 MG/ML IJ SOLN
1.0000 mg | Freq: Once | INTRAMUSCULAR | Status: AC
  Administered 2015-05-07: 1 mg via INTRAVENOUS
  Filled 2015-05-07: qty 1

## 2015-05-07 NOTE — ED Notes (Signed)
Taken to CT.

## 2015-05-07 NOTE — ED Notes (Signed)
Taken to xray  At this time.

## 2015-05-07 NOTE — ED Notes (Addendum)
PER [facility]: Patient to ED from home c/o pain from abdominal hernia (unsure what kind/where) and nausea ongoing for a month now. EMS VS: 130/98, HR 72, RR 14. A&O x 4.

## 2015-05-07 NOTE — ED Provider Notes (Addendum)
CSN: [id number]     Arrival date & time 05/07/15  0240 History  By signing my name below, I, Linda Vaughn, attest that this documentation has been prepared under the direction and in the presence of Zadie Rhineonald Yosgart Pavey, MD. Electronically Signed: Bethel BornBritney Vaughn, ED Scribe. 05/07/2015. 3:40 AM    Chief Complaint  Patient presents with  . Abdominal Pain   Patient is a 50 y.o. female presenting with abdominal pain. The history is provided by the patient. No language interpreter was used.  Abdominal Pain Pain location: mid abdominal  Pain severity:  Severe Onset quality:  Gradual Duration:  1 day Timing:  Constant Progression:  Worsening Chronicity:  New Associated symptoms: constipation, nausea and vomiting   Associated symptoms: no diarrhea, no dysuria, no fever and no shortness of breath    Linda Vaughn is a 50 y.o. female who presents to the Emergency Department complaining of worsening, 10/10 in severity,  mid abdominal pain associated with a hernia with onset yesterday. The pain significantly worsened approximately 1 hour ago. She notes that the pain is exacerbated by eating. Associated symptoms include nausea, vomiting, constipation. Pt denies fever, chest pain, and dysuria. She last ate at dinner last night but was unable to hold it down.   Past Medical History  Diagnosis Date  . Arthritis   . Gallstones   . Hypertension    Past Surgical History  Procedure Laterality Date  . Ankle surgery    . Carpal tunnel release    . Ankle surgery    . Cesarean section    . Cholecystectomy     Family History  Problem Relation Age of Onset  . Cancer Mother   . Rheum arthritis Father   . Diabetes Other    Social History  Substance Use Topics  . Smoking status: Current Every Day Smoker -- 0.50 packs/day  . Smokeless tobacco: Never Used  . Alcohol Use: No     Comment: occ   OB History    No data available     Review of Systems  Constitutional: Negative for fever.   Respiratory: Negative for shortness of breath.   Gastrointestinal: Positive for nausea, vomiting, abdominal pain and constipation. Negative for diarrhea.  Genitourinary: Negative for dysuria.  All other systems reviewed and are negative.     Allergies  Aspirin; Ibuprofen; and Tylenol  Home Medications   Prior to Admission medications   Medication Sig Start Date End Date Taking? Authorizing Provider  oxyCODONE (ROXICODONE) 5 MG immediate release tablet Take 1 tablet (5 mg total) by mouth every 6 (six) hours as needed for severe pain. Patient not taking: Reported on 05/07/2015 03/19/15   Doug SouSam Jacubowitz, MD   BP 145/101 mmHg  Pulse 64  Temp(Src) 98.5 F (36.9 C) (Oral)  Resp 19  Ht 5' (1.524 m)  Wt 190 lb (86.183 kg)  BMI 37.11 kg/m2  SpO2 99% Physical Exam CONSTITUTIONAL: Well developed/well nourished HEAD: Normocephalic/atraumatic EYES: EOMI/PERRL ENMT: Mucous membranes moist NECK: supple no meningeal signs SPINE/BACK:entire spine nontender CV: S1/S2 noted, no murmurs/rubs/gallops noted LUNGS: Lungs are clear to auscultation bilaterally, no apparent distress ABDOMEN: soft, tenderness just above the umbilicus, hernia noted, no rebound or guarding, decreased bowel sounds noted, distended GU:no cva tenderness NEURO: Pt is awake/alert/appropriate, moves all extremitiesx4.  No facial droop.   EXTREMITIES: pulses normal/equal, full ROM SKIN: warm, color normal PSYCH: Anxious and tearful  ED Course  Procedures DIAGNOSTIC STUDIES: Oxygen Saturation is 99% on RA,  normal by my interpretation.  COORDINATION OF CARE: 3:40 AM Discussed treatment plan which includes lab work, abdominal XR, EKG, pain medication, and antiemetic medication with pt at bedside and pt agreed to plan. 4:57 AM Due to worsening pain, focal abdominal tenderness with hernia that is not able to be reduced will proceed with CT imaging after I review acute abdominal series xrays 7:35 AM Ct scan negative for  acute disease I spoke to radiology dr bloomer and there is no acute findings, no obstruction She does cirrhosis/ascites though stable from prior Pt improved She is taking PO abd tenderness improved on exam I spoke at length about her cirrhosis/ascites.  It could be advanced as her INR is elevated She is unaware of this.  She was told she only has "3 spots on my liver" i advised need for close PCP f/u in a week for re-evaluation and more testing for her cirrhosis Also given info for GI At this point there is no need for acute surgical evaluation Ascites is stable from prior CT, and does not appear to have findings of SBP  Labs Review Labs Reviewed  CBC WITH DIFFERENTIAL/PLATELET - Abnormal; Notable for the following:    RBC 3.38 (*)    HCT 34.7 (*)    MCV 102.7 (*)    MCH 36.4 (*)    Platelets 103 (*)    Neutro Abs 1.4 (*)    All other components within normal limits  BASIC METABOLIC PANEL - Abnormal; Notable for the following:    Calcium 8.1 (*)    All other components within normal limits  PROTIME-INR - Abnormal; Notable for the following:    Prothrombin Time 18.6 (*)    INR 1.54 (*)    All other components within normal limits  URINALYSIS, ROUTINE W REFLEX MICROSCOPIC (NOT AT [facility]) - Abnormal; Notable for the following:    APPearance CLOUDY (*)    Nitrite POSITIVE (*)    Leukocytes, UA TRACE (*)    All other components within normal limits  URINE MICROSCOPIC-ADD ON - Abnormal; Notable for the following:    Squamous Epithelial / LPF 6-30 (*)    Bacteria, UA MANY (*)    Crystals CA OXALATE CRYSTALS (*)    All other components within normal limits  URINE CULTURE  I-STAT CG4 LACTIC ACID, ED  TYPE AND SCREEN  ABO/RH    Imaging Review Ct Abdomen Pelvis W Contrast  05/07/2015  CLINICAL DATA:  Abdominal pain and vomiting, abdominal distention and constipation. History of cirrhosis, cholecystectomy, hypertension. EXAM: CT ABDOMEN AND PELVIS WITH CONTRAST TECHNIQUE:  Multidetector CT imaging of the abdomen and pelvis was performed using the standard protocol following bolus administration of intravenous contrast. CONTRAST:  100 cc ISOVUE-300 IOPAMIDOL (ISOVUE-300) INJECTION 61% COMPARISON:  Abdominal radiograph May 07, 2015 at 0343 hours and CT abdomen and pelvis March 19, 2015 FINDINGS: LUNG BASES: Included view of the lung bases are clear. Visualized heart and pericardium are unremarkable. SOLID ORGANS: Similarly nodular liver with enlarged LEFT lobe consistent with known cirrhosis. Recannulized paraumbilical vein. Patent portal vein. Mild splenomegaly. Normal appearance of the pancreas and adrenal glands. Status postcholecystectomy. GASTROINTESTINAL TRACT: The stomach, small and large bowel are normal in course and caliber without inflammatory changes. Mild colonic diverticulosis. Mildly edematous small bowel consistent with portal hypertension. The appendix is not discretely identified, however there are no inflammatory changes in the right lower quadrant. KIDNEYS/ URINARY TRACT: Kidneys are orthotopic, demonstrating symmetric enhancement. No nephrolithiasis, hydronephrosis or solid renal masses. The unopacified ureters are normal in  course and caliber. Delayed imaging through the kidneys demonstrates symmetric prompt contrast excretion within the proximal urinary collecting system. Urinary bladder is partially distended and unremarkable. PERITONEUM/RETROPERITONEUM: Aortoiliac vessels are normal in course and caliber, mild calcific atherosclerosis. No lymphadenopathy by CT size criteria. Internal reproductive organs are unremarkable. Similar amount of moderate ascites. Similar mesenteric edema. Stable moderate fluid-filled umbilical hernia. SOFT TISSUE/OSSEOUS STRUCTURES: Non-suspicious.  Mild anasarca. IMPRESSION: Stable cirrhosis and sequelae of portal hypertension including moderate ascites. Electronically Signed   By: Awilda Metro M.D.   On: 05/07/2015 05:54   Dg  Abd Acute W/chest  05/07/2015  CLINICAL DATA:  Severe abdominal pain. EXAM: DG ABDOMEN ACUTE W/ 1V CHEST COMPARISON:  Chest 12/19/2014.  CT abdomen 03/19/2015. FINDINGS: Shallow inspiration with mild atelectasis in the lung bases. No focal consolidation. Normal heart size and pulmonary vascularity. Scattered gas and stool throughout the colon. No small or large bowel distention. There is a single prominent gas-filled small bowel loop in the left mid abdomen with suggestion of fold thickening. This is not significantly distended and likely represents focal ileus or possibly enteritis. No free intra-abdominal air. No abnormal air-fluid levels. Surgical clips in the right upper quadrant. Degenerative changes in the spine and hips. No radiopaque stones. IMPRESSION: Single prominent gas-filled nondilated small bowel loop in the left abdomen with suggestion of fold thickening may represent localized ileus or enteritis. No findings of obstruction. Electronically Signed   By: Burman Nieves M.D.   On: 05/07/2015 04:12   I have personally reviewed and evaluated these images and lab results as part of my medical decision-making.   EKG Interpretation   Date/Time:  Monday May 07 2015 04:39:26 EDT Ventricular Rate:  63 PR Interval:  146 QRS Duration: 95 QT Interval:  436 QTC Calculation: 446 R Axis:   36 Text Interpretation:  Sinus rhythm Low voltage, precordial leads No  previous ECGs available Confirmed by Bebe Shaggy  MD, Dorinda Hill ([id number]) on  05/07/2015 4:46:25 AM      MDM   Final diagnoses:  Generalized abdominal pain  Cirrhosis of liver with ascites, unspecified hepatic cirrhosis type [facility])    Nursing notes including past medical history and social history reviewed and considered in documentation xrays/imaging reviewed by myself and considered during evaluation Labs/vital reviewed myself and considered during evaluation Previous records reviewed and considered   I personally performed the  services described in this documentation, which was scribed in my presence. The recorded information has been reviewed and is accurate.       Zadie Rhine, MD 05/07/15 6045  Zadie Rhine, MD 05/07/15 [phone]

## 2015-05-09 LAB — URINE CULTURE: Culture: 80000 — AB

## 2015-05-10 ENCOUNTER — Telehealth: Payer: Self-pay | Admitting: *Deleted

## 2015-05-10 NOTE — ED Notes (Signed)
Post ED Visit - Positive Culture Follow-up  Culture report reviewed by antimicrobial stewardship pharmacist:  []  Enzo BiNathan Batchelder, Pharm.D. []  Celedonio MiyamotoJeremy Frens, Pharm.D., BCPS []  Garvin FilaMike Maccia, Pharm.D. []  Georgina PillionElizabeth Martin, Pharm.D., BCPS []  [place]burgMinh Pham, [address] Rainbow BoulevardPharm.D., BCPS, AAHIVP []  Estella HuskMichelle Turner, Pharm.D., BCPS, AAHIVP []  Tennis Mustassie Stewart, Pharm.D. []  Sherle Poeob Vincent, Vermont[place]arm.D.  Positive urine culture with no urinary symptoms. No further patient follow-up is required at this time. Renne CriglerJoshua Geiple, PA-C  Virl AxeRobertson, Laquincy [place] 05/10/2015, 10:28 AM

## 2015-05-10 NOTE — Progress Notes (Signed)
ED Antimicrobial Stewardship Positive Culture Follow Up   Linda Vaughn is an 50 y.o. female who [facility] with a chief complaint of  Chief Complaint  Patient presents with  . Abdominal Pain    Recent Results (from the past 720 hour(s))  Urine culture     Status: Abnormal   Collection Time: 05/07/15  6:03 AM  Result Value Ref Range Status   Specimen Description URINE, RANDOM  Final   Special Requests NONE  Final   Culture 80,000 COLONIES/mL ESCHERICHIA COLI (A)  Final   Report Status 05/09/2015 FINAL  Final   Organism ID, Bacteria ESCHERICHIA COLI (A)  Final      Susceptibility   Escherichia coli - MIC*    AMPICILLIN <=2 SENSITIVE Sensitive     CEFAZOLIN <=4 SENSITIVE Sensitive     CEFTRIAXONE <=1 SENSITIVE Sensitive     CIPROFLOXACIN <=0.25 SENSITIVE Sensitive     GENTAMICIN 4 SENSITIVE Sensitive     IMIPENEM <=0.25 SENSITIVE Sensitive     NITROFURANTOIN <=16 SENSITIVE Sensitive     TRIMETH/SULFA <=20 SENSITIVE Sensitive     AMPICILLIN/SULBACTAM <=2 SENSITIVE Sensitive     PIP/TAZO <=4 SENSITIVE Sensitive     * 80,000 COLONIES/mL ESCHERICHIA COLI    No urinary symptoms and UA negative. No treatment indicated at this time.  ED Provider: Renne CriglerJoshua Geiple, PA-C  Mattilyn Crites L. Roseanne RenoStewart, PharmD PGY2 Infectious Diseases Pharmacy Resident Pager: [phone] 05/10/2015 9:42 AM

## 2015-05-24 ENCOUNTER — Inpatient Hospital Stay (HOSPITAL_COMMUNITY)
Admission: EM | Admit: 2015-05-24 | Discharge: 2015-05-30 | DRG: 871 | Disposition: A | Discharge status: Home / Self Care | Payer: Medicaid Other | Attending: Internal Medicine | Admitting: Internal Medicine

## 2015-05-24 ENCOUNTER — Encounter (HOSPITAL_COMMUNITY): Payer: Self-pay | Admitting: Emergency Medicine

## 2015-05-24 ENCOUNTER — Emergency Department (HOSPITAL_COMMUNITY): Payer: Medicaid Other

## 2015-05-24 DIAGNOSIS — K766 Portal hypertension: Secondary | ICD-10-CM | POA: Diagnosis present

## 2015-05-24 DIAGNOSIS — K7460 Unspecified cirrhosis of liver: Secondary | ICD-10-CM | POA: Insufficient documentation

## 2015-05-24 DIAGNOSIS — D696 Thrombocytopenia, unspecified: Secondary | ICD-10-CM | POA: Diagnosis present

## 2015-05-24 DIAGNOSIS — F1410 Cocaine abuse, uncomplicated: Secondary | ICD-10-CM | POA: Diagnosis present

## 2015-05-24 DIAGNOSIS — B182 Chronic viral hepatitis C: Secondary | ICD-10-CM | POA: Insufficient documentation

## 2015-05-24 DIAGNOSIS — A419 Sepsis, unspecified organism: Secondary | ICD-10-CM | POA: Diagnosis present

## 2015-05-24 DIAGNOSIS — R188 Other ascites: Secondary | ICD-10-CM | POA: Diagnosis present

## 2015-05-24 DIAGNOSIS — K529 Noninfective gastroenteritis and colitis, unspecified: Secondary | ICD-10-CM | POA: Diagnosis present

## 2015-05-24 DIAGNOSIS — R109 Unspecified abdominal pain: Secondary | ICD-10-CM | POA: Diagnosis present

## 2015-05-24 DIAGNOSIS — I10 Essential (primary) hypertension: Secondary | ICD-10-CM | POA: Diagnosis present

## 2015-05-24 DIAGNOSIS — K429 Umbilical hernia without obstruction or gangrene: Secondary | ICD-10-CM | POA: Diagnosis present

## 2015-05-24 DIAGNOSIS — M1990 Unspecified osteoarthritis, unspecified site: Secondary | ICD-10-CM | POA: Diagnosis present

## 2015-05-24 DIAGNOSIS — F1910 Other psychoactive substance abuse, uncomplicated: Secondary | ICD-10-CM | POA: Insufficient documentation

## 2015-05-24 DIAGNOSIS — F17210 Nicotine dependence, cigarettes, uncomplicated: Secondary | ICD-10-CM | POA: Diagnosis present

## 2015-05-24 DIAGNOSIS — K652 Spontaneous bacterial peritonitis: Secondary | ICD-10-CM | POA: Diagnosis present

## 2015-05-24 LAB — RAPID URINE DRUG SCREEN, HOSP PERFORMED
Amphetamines: NOT DETECTED
BARBITURATES: NOT DETECTED
Benzodiazepines: NOT DETECTED
Cocaine: POSITIVE — AB
Opiates: POSITIVE — AB
Tetrahydrocannabinol: POSITIVE — AB

## 2015-05-24 LAB — URINALYSIS, ROUTINE W REFLEX MICROSCOPIC
GLUCOSE, UA: NEGATIVE mg/dL
HGB URINE DIPSTICK: NEGATIVE
KETONES UR: NEGATIVE mg/dL
Nitrite: POSITIVE — AB
PH: 5.5 (ref 5.0–8.0)
PROTEIN: NEGATIVE mg/dL
Specific Gravity, Urine: >1.046 — ABNORMAL HIGH (ref 1.005–1.030)

## 2015-05-24 LAB — LACTATE DEHYDROGENASE, PLEURAL OR PERITONEAL FLUID: LD FL: 104 U/L — AB (ref 3–23)

## 2015-05-24 LAB — COMPREHENSIVE METABOLIC PANEL
ALBUMIN: 2.5 g/dL — AB (ref 3.5–5.0)
ALK PHOS: 196 U/L — AB (ref 38–126)
ALT: 62 U/L — AB (ref 14–54)
AST: 141 U/L — AB (ref 15–41)
Anion gap: 8 (ref 5–15)
BILIRUBIN TOTAL: 3.8 mg/dL — AB (ref 0.3–1.2)
BUN: 8 mg/dL (ref 6–20)
CO2: 24 mmol/L (ref 22–32)
CREATININE: 0.61 mg/dL (ref 0.44–1.00)
Calcium: 8.3 mg/dL — ABNORMAL LOW (ref 8.9–10.3)
Chloride: 106 mmol/L (ref 101–111)
GFR calc Af Amer: >60 mL/min (ref >60)
Glucose, Bld: 112 mg/dL — ABNORMAL HIGH (ref 65–99)
Potassium: 4 mmol/L (ref 3.5–5.1)
Sodium: 138 mmol/L (ref 135–145)
TOTAL PROTEIN: 7.7 g/dL (ref 6.5–8.1)

## 2015-05-24 LAB — CBC
HCT: 36.2 % (ref 36.0–46.0)
Hemoglobin: 12.8 g/dL (ref 12.0–15.0)
MCH: 35.7 pg — ABNORMAL HIGH (ref 26.0–34.0)
MCHC: 35.4 g/dL (ref 30.0–36.0)
MCV: 100.8 fL — ABNORMAL HIGH (ref 78.0–100.0)
PLATELETS: 122 10*3/uL — AB (ref 150–400)
RBC: 3.59 MIL/uL — ABNORMAL LOW (ref 3.87–5.11)
RDW: 14.9 % (ref 11.5–15.5)
WBC: 4.8 10*3/uL (ref 4.0–10.5)

## 2015-05-24 LAB — BODY FLUID CELL COUNT WITH DIFFERENTIAL
Lymphs, Fluid: 3 %
Monocyte-Macrophage-Serous Fluid: 4 % — ABNORMAL LOW (ref 50–90)
Neutrophil Count, Fluid: 93 % — ABNORMAL HIGH (ref 0–25)
WBC FLUID: 4778 uL — AB (ref 0–1000)

## 2015-05-24 LAB — URINE MICROSCOPIC-ADD ON

## 2015-05-24 LAB — ALBUMIN, FLUID (OTHER): Albumin, Fluid: <1 g/dL

## 2015-05-24 LAB — GLUCOSE, PERITONEAL FLUID: GLUCOSE, PERITONEAL FLUID: 102 mg/dL

## 2015-05-24 LAB — LIPASE, BLOOD: Lipase: 20 U/L (ref 11–51)

## 2015-05-24 LAB — PROTEIN, BODY FLUID: Total protein, fluid: <3 g/dL

## 2015-05-24 MED ORDER — LIDOCAINE-EPINEPHRINE (PF) 2 %-1:200000 IJ SOLN
5.0000 mL | Freq: Once | INTRAMUSCULAR | Status: DC
  Filled 2015-05-24: qty 5

## 2015-05-24 MED ORDER — ONDANSETRON HCL 4 MG/2ML IJ SOLN
4.0000 mg | Freq: Once | INTRAMUSCULAR | Status: AC
  Administered 2015-05-24: 4 mg via INTRAVENOUS
  Filled 2015-05-24: qty 2

## 2015-05-24 MED ORDER — LIDOCAINE-EPINEPHRINE (PF) 2 %-1:200000 IJ SOLN
20.0000 mL | Freq: Once | INTRAMUSCULAR | Status: DC
  Filled 2015-05-24: qty 20

## 2015-05-24 MED ORDER — OXYCODONE HCL 5 MG PO TABS
5.0000 mg | ORAL_TABLET | Freq: Four times a day (QID) | ORAL | Status: DC | PRN
  Administered 2015-05-24 – 2015-05-30 (×19): 5 mg via ORAL
  Filled 2015-05-24 (×20): qty 1

## 2015-05-24 MED ORDER — SODIUM CHLORIDE 0.9 % IV BOLUS (SEPSIS)
1000.0000 mL | Freq: Once | INTRAVENOUS | Status: AC
  Administered 2015-05-24: 1000 mL via INTRAVENOUS

## 2015-05-24 MED ORDER — HYDROMORPHONE HCL 1 MG/ML IJ SOLN
1.0000 mg | Freq: Once | INTRAMUSCULAR | Status: AC
  Administered 2015-05-24: 1 mg via INTRAVENOUS
  Filled 2015-05-24: qty 1

## 2015-05-24 MED ORDER — IOPAMIDOL (ISOVUE-300) INJECTION 61%
100.0000 mL | Freq: Once | INTRAVENOUS | Status: AC | PRN
  Administered 2015-05-24: 100 mL via INTRAVENOUS

## 2015-05-24 MED ORDER — MORPHINE SULFATE (PF) 4 MG/ML IV SOLN: 4.0000 mg | Freq: Once | INTRAVENOUS | Status: DC

## 2015-05-24 MED ORDER — NICOTINE 7 MG/24HR TD PT24
7.0000 mg | MEDICATED_PATCH | Freq: Every day | TRANSDERMAL | Status: DC
  Administered 2015-05-24 – 2015-05-30 (×7): 7 mg via TRANSDERMAL
  Filled 2015-05-24 (×8): qty 1

## 2015-05-24 MED ORDER — GI COCKTAIL ~~LOC~~
30.0000 mL | Freq: Once | ORAL | Status: AC
  Administered 2015-05-24: 30 mL via ORAL
  Filled 2015-05-24: qty 30

## 2015-05-24 MED ORDER — METRONIDAZOLE IN NACL 5-0.79 MG/ML-% IV SOLN
500.0000 mg | Freq: Three times a day (TID) | INTRAVENOUS | Status: DC
  Administered 2015-05-24 – 2015-05-25 (×2): 500 mg via INTRAVENOUS
  Filled 2015-05-24 (×3): qty 100

## 2015-05-24 MED ORDER — ONDANSETRON HCL 4 MG/2ML IJ SOLN
4.0000 mg | Freq: Three times a day (TID) | INTRAMUSCULAR | Status: DC | PRN
  Administered 2015-05-27: 4 mg via INTRAVENOUS
  Filled 2015-05-24: qty 2

## 2015-05-24 MED ORDER — CIPROFLOXACIN IN D5W 400 MG/200ML IV SOLN
400.0000 mg | Freq: Two times a day (BID) | INTRAVENOUS | Status: DC
  Administered 2015-05-24 – 2015-05-25 (×2): 400 mg via INTRAVENOUS
  Filled 2015-05-24 (×2): qty 200

## 2015-05-24 MED ORDER — MORPHINE SULFATE (PF) 4 MG/ML IV SOLN
8.0000 mg | Freq: Once | INTRAVENOUS | Status: AC
Start: 2015-05-24 — End: 2015-05-24
  Administered 2015-05-24: 8 mg via INTRAVENOUS
  Filled 2015-05-24: qty 2

## 2015-05-24 MED ORDER — HYDROMORPHONE HCL 1 MG/ML IJ SOLN
1.0000 mg | INTRAMUSCULAR | Status: AC | PRN
  Administered 2015-05-25 (×2): 1 mg via INTRAVENOUS
  Filled 2015-05-24 (×2): qty 1

## 2015-05-24 NOTE — ED Notes (Signed)
Nurse is going to try and get labs

## 2015-05-24 NOTE — ED Notes (Addendum)
Pt reports midline upper abd pain for the past 24 hours. Hx of hiatal hernia. Has been throwing up at home and is unable to keep fluids down. No diarrhea. EMS gave fentanyl prior to arrival.

## 2015-05-24 NOTE — ED Notes (Signed)
Patient transported to CT 

## 2015-05-24 NOTE — ED Notes (Signed)
Consent obtained for paracentesis.

## 2015-05-24 NOTE — Progress Notes (Signed)
Patient listed as being seen in the ED 5 times within the last six months.  Patient presents to ED with abdominal pain.  Patient listed as having no insurance but [facility].  EDCM went to speak to patient at bedside, however patient is on the phone.     [facility], informed patient of services there.  EDCM also provided patient with list of pcps who accept self pay patients, list of discount pharmacies and websites needymeds.org and GoodRX.com for medication assistance, phone number to inquire about the orange card, phone number to inquire about Medicaid, phone number to inquire about the Affordable Care Act, financial resources in the community such as local churches, [employer], urban ministries, and dental assistance for uninsured patients.   No further EDCM needs at this time.

## 2015-05-24 NOTE — Progress Notes (Signed)
Pharmacy Antibiotic Follow-up Note  Linda Vaughn is a 50 y.o. year-old female admitted on 05/24/2015.  The patient is currently on day 1 of Cipro for r/o intraabdominal infection.  Assessment/Plan: This patient's current antibiotics will be continued without adjustments.  Temp (24hrs), Avg:98.7 F (37.1 C), Min:98.2 F (36.8 C), Max:99.2 F (37.3 C)   Recent Labs Lab 05/24/15 1158  WBC 4.8    Recent Labs Lab 05/24/15 1158  CREATININE 0.61   CrCl cannot be calculated (Unknown ideal weight.).    Allergies  Allergen Reactions  . Aspirin Other (See Comments)    Liver damage   . Ibuprofen Other (See Comments)    Liver damage  . Tylenol [Acetaminophen] Other (See Comments)    Liver damage   Antimicrobials this admission: 5/18 Cipro >>   Microbiology results: 5/18 Fluid Cx (paracentesis): sent  Thank you for allowing pharmacy to be a part of this patient's care. Pharmacy will sign off protocol  Linda Vaughn, Linda Vaughn PharmD 05/24/2015 8:44 PM

## 2015-05-24 NOTE — H&P (Signed)
History and Physical  Linda Vaughn JYN:[id number] DOB: 1965-01-11 DOA: 05/24/2015  Referring physician: EDP PCP: [facility],Lachina M, FNP   Chief Complaint: abdominal distention, abdominal pain  HPI: Linda Vaughn is a 50 y.o. female  With remote h/o alcohol use (reported quit 1816yrs ago), h/o gallstone s/p cholecystectomy a few years ago [facility] to above complaints, Ct ab/pel with cirrhosis/ascites/ ? Nonspecific colitis and stable umbilical hernia, mild splenomegaly, diagnostic paracentesis underwent by EDP, result pending. Labs with macrocytosis, mild thrombocytopenia, normal renal function, normal lipase level and elevated lft's. hospitalist called to admit the patient Patient denies diarrhea, no hematochezia or hematemesis.    Review of Systems:  Detail per HPI, Review of systems are otherwise negative  Past Medical History  Diagnosis Date  . Arthritis   . Gallstones   . Hypertension    Past Surgical History  Procedure Laterality Date  . Ankle surgery    . Carpal tunnel release    . Ankle surgery    . Cesarean section    . Cholecystectomy     Social History:  reports that she has been smoking.  She has never used smokeless tobacco. She reports that she does not drink alcohol or use illicit drugs. Patient lives at home & is able to participate in activities of daily living independently   Allergies  Allergen Reactions  . Aspirin Other (See Comments)    Liver damage   . Ibuprofen Other (See Comments)    Liver damage  . Tylenol [Acetaminophen] Other (See Comments)    Liver damage    Family History  Problem Relation Age of Onset  . Cancer Mother   . Rheum arthritis Father   . Diabetes Other       Prior to Admission medications   Medication Sig Start Date End Date Taking? Authorizing Provider  oxyCODONE (ROXICODONE) 5 MG immediate release tablet Take 1 tablet (5 mg total) by mouth every 6 (six) hours as needed for severe pain. Patient not taking:  Reported on 05/07/2015 03/19/15   Doug SouSam Jacubowitz, MD    Physical Exam: BP 132/88 mmHg  Pulse 72  Temp(Src) 98.2 F (36.8 C) (Oral)  Resp 16  SpO2 99%  General:  Uncomfortable, nontoxic appearing Eyes: PERRL ENT: unremarkable Neck: supple, no JVD Cardiovascular: RRR Respiratory: CTABL Abdomen: distended, diffuse tender, ? rebound, positive bowel sounds Skin: no rash Musculoskeletal:  Bilateral Pitting lower extremity edema Psychiatric: calm/cooperative Neurologic: no focal findings            Labs on Admission:  Basic Metabolic Panel:  Recent Labs Lab 05/24/15 1158  NA 138  K 4.0  CL 106  CO2 24  GLUCOSE 112*  BUN 8  CREATININE 0.61  CALCIUM 8.3*   Liver Function Tests:  Recent Labs Lab 05/24/15 1158  AST 141*  ALT 62*  ALKPHOS 196*  BILITOT 3.8*  PROT 7.7  ALBUMIN 2.5*    Recent Labs Lab 05/24/15 1158  LIPASE 20   No results for input(s): AMMONIA in the last 168 hours. CBC:  Recent Labs Lab 05/24/15 1158  WBC 4.8  HGB 12.8  HCT 36.2  MCV 100.8*  PLT 122*   Cardiac Enzymes: No results for input(s): CKTOTAL, CKMB, CKMBINDEX, TROPONINI in the last 168 hours.  BNP (last 3 results) No results for input(s): BNP in the last 8760 hours.  ProBNP (last 3 results) No results for input(s): PROBNP in the last 8760 hours.  CBG: No results for input(s): GLUCAP in  the last 168 hours.  Radiological Exams on Admission: Ct Abdomen Pelvis W Contrast  05/24/2015  CLINICAL DATA:  Midline upper abdominal pain for 24 hours, history of hiatal hernia EXAM: CT ABDOMEN AND PELVIS WITH CONTRAST TECHNIQUE: Multidetector CT imaging of the abdomen and pelvis was performed using the standard protocol following bolus administration of intravenous contrast. CONTRAST:  ISOVUE-300 IOPAMIDOL (ISOVUE-300) INJECTION 61% COMPARISON:  05/07/2015 FINDINGS: Lower chest:  The lung bases are unremarkable. Hepatobiliary: Again noted cirrhotic liver. There is perihepatic and  perisplenic ascites again noted. Moderate ascites bilateral paracolic gutters again noted. The patient is status postcholecystectomy. No focal hepatic mass. Pancreas: Enhanced pancreas is unremarkable. Spleen: Mild splenomegaly is stable. Adrenals/Urinary Tract: No adrenal gland mass. Kidneys are symmetrical in size and enhancement. No hydronephrosis or hydroureter. Delayed renal images shows bilateral renal symmetrical excretion. Bilateral visualized proximal ureter is unremarkable. The urinary bladder is unremarkable. Stomach/Bowel: There is no gastric outlet obstruction. Mild distended distal small bowel loops with some fluid and air-fluid levels are noted lower abdomen see axial image 42. Findings suspicious for enteritis. There is thickening of the cecal wall and proximal right colon. Subtle mild enhancement of the right colon mucosa. Nonspecific colitis cannot be excluded. Clinical correlation is necessary. The appendix is not identified. There is no transition point in caliber of small bowel to suggest small bowel obstruction. Colonic diverticula are noted descending colon and proximal sigmoid colon. There is no evidence of acute diverticulitis or distal colitis. Vascular/Lymphatic: Mild atherosclerotic calcifications of abdominal aorta and iliac arteries. No mesenteric or retroperitoneal adenopathy. Portal vein measures 1.4 cm in diameter the probable due to portal hypertension. Reproductive: The uterus and ovaries are unremarkable. Other: There is moderate ascites bilateral paracolic gutters. Stable umbilical hernia containing fluid. Moderate pelvic ascites is noted. Musculoskeletal: No destructive bony lesions are noted. Sagittal images of the spine shows mild degenerative changes thoracolumbar spine. IMPRESSION: 1. Again noted cirrhosis of the liver and moderate pelvic and abdominal ascites probable due to portal hypertension. Mild splenomegaly. 2. Mild fluid distended with some air-fluid levels distal  small bowel loops probable due to ileus or enteritis. No transition point in caliber of small bowel. There is some thickening of cecal wall and proximal right colon. Mild enhancement of right colon mucosa. Findings highly suspicious for nonspecific colitis. Enterocolitis cannot be excluded. The appendix is not identified. 3. Unremarkable uterus and ovaries. 4. Stable umbilical hernia containing fluid. 5. No hydronephrosis or hydroureter. Electronically Signed   By: Natasha Mead M.D.   On: 05/24/2015 14:48    EKG: Independently reviewed. Sinus rhythm, low voltage, no acute st/t changes  Assessment/Plan Present on Admission:  **None**  Abdominal pain/ascites/cirrhosis/lower extremity edema; new diagnosis. Per chart review there is evidence of cirrhosis as early as from 2014, there is evidence of ascites on ct ab from 2016, however, patient never followed up for this. She denies current alcohol/drug use. Will check hiv/hepatitic panel./uds /serum etoh level. Start cipro/flagyl after diagnostic paracentesis. start lasix/spironolectone in am, monitor cr  Smoking cessation education provided, patient would like to have nicotine patch while she is in the hospital  DVT prophylaxis: scd's  Consultants: none  Code Status: full   Family Communication:  Patient   Disposition Plan: admit to med surg  Time spent:  Kalika Smay MD, PhD [facility] Pager [phone] If 7PM-7AM, please contact night-coverage at [email or web address], password [email or web address]

## 2015-05-24 NOTE — ED Notes (Signed)
MD at bedside. Dr. Doristine CounterPfiffer at bedside for paracentesis.

## 2015-05-24 NOTE — ED Provider Notes (Signed)
CSN: [id number]     Arrival date & time 05/24/15  1135 History   First MD Initiated Contact with Patient 05/24/15 1347     Chief Complaint  Patient presents with  . Abdominal Pain  . Emesis     (Consider location/radiation/quality/duration/timing/severity/associated sxs/prior Treatment) HPI Comments: Linda Vaughn is a 50 year old female with history of hypertension, obesity, depression, umbilical hernia, current smoker,   Patient is a 50 y.o. female presenting with abdominal pain. The history is provided by the patient.  Abdominal Pain Pain location:  Periumbilical Pain quality: bloating, sharp and stabbing   Pain radiates to:  Epigastric region (radiates everywhere) Pain severity:  Severe Onset quality:  Gradual Duration:  1 week Timing:  Constant Chronicity:  New Context: eating   Context: not alcohol use, not medication withdrawal, not recent travel, not sick contacts, not suspicious food intake and not trauma  Prior surgeries: hx of cholecystectomy.   Relieved by:  Nothing ( somewhat by laying on her left side) Worsened by:  Movement, deep breathing, palpation and position changes Ineffective treatments:  None tried Associated symptoms: fever (subjective fever "feeling hot" this am), nausea and vomiting   Associated symptoms: no anorexia, no belching, no chest pain, no chills, no constipation, no cough, no diarrhea, no dysuria, no fatigue, no hematemesis, no hematochezia, no hematuria, no melena, no shortness of breath, no sore throat, no vaginal bleeding and no vaginal discharge   Vomiting:    Quality:  Stomach contents (non-bloody, non-bilious)   Number of occurrences:  3   Severity:  Mild   Duration:  24 hours   Timing:  Intermittent   Progression:  Unchanged Risk factors: obesity   Risk factors: no alcohol abuse, no aspirin use, no NSAID use and no recent hospitalization     Fever this am with vomiting, 3x emesis non-bloody, non-bilious, felt dizzy afterward One week  of gradually worsening abdominal pain with eating with bloating.  No fever, but pt feels chills. No diarrhea, passing gas and having normal BM's.  Past Medical History  Diagnosis Date  . Arthritis   . Gallstones   . Hypertension    Past Surgical History  Procedure Laterality Date  . Ankle surgery    . Carpal tunnel release    . Ankle surgery    . Cesarean section    . Cholecystectomy     Family History  Problem Relation Age of Onset  . Cancer Mother   . Rheum arthritis Father   . Diabetes Other    Social History  Substance Use Topics  . Smoking status: Current Every Day Smoker -- 0.50 packs/day  . Smokeless tobacco: Never Used  . Alcohol Use: No     Comment: occ   OB History    No data available     Review of Systems  Constitutional: Positive for fever (subjective fever "feeling hot" this am). Negative for chills and fatigue.  HENT: Negative for sore throat.   Respiratory: Negative for cough and shortness of breath.   Cardiovascular: Negative for chest pain.  Gastrointestinal: Positive for nausea, vomiting and abdominal pain. Negative for diarrhea, constipation, melena, hematochezia, anorexia and hematemesis.  Genitourinary: Negative for dysuria, hematuria, vaginal bleeding and vaginal discharge.  All other systems reviewed and are negative.     Allergies  Aspirin; Ibuprofen; and Tylenol  Home Medications   Prior to Admission medications   Medication Sig Start Date End Date Taking? Authorizing Provider  ciprofloxacin (CIPRO) 500 MG tablet Take 1 tablet (500 mg  total) by mouth daily with breakfast. 05/30/15   Kelvin Cellar, MD  magnesium oxide (MAG-OX) 400 (241.3 Mg) MG tablet Take 1 tablet (400 mg total) by mouth daily. 05/30/15   Kelvin Cellar, MD  oxyCODONE (ROXICODONE) 5 MG immediate release tablet Take 1 tablet (5 mg total) by mouth every 6 (six) hours as needed for severe pain. 05/30/15   Kelvin Cellar, MD   BP 101/53 mmHg  Pulse 88  Temp(Src) 98.1 F  (36.7 C) (Oral)  Resp 16  Ht 5' (1.524 m)  Wt 94.257 kg  BMI 40.58 kg/m2  SpO2 95% Physical Exam  Constitutional: She is oriented to person, place, and time. She appears well-developed and well-nourished. No distress.  Non-toxic appearing, appears extremely uncomfortable   HENT:  Head: Normocephalic and atraumatic.  Nose: Nose normal.  Mouth/Throat: Oropharynx is clear and moist. No oropharyngeal exudate.  Eyes: Conjunctivae and EOM are normal. Pupils are equal, round, and reactive to light. Right eye exhibits no discharge. Left eye exhibits no discharge. No scleral icterus.  Neck: Normal range of motion. No JVD present. No tracheal deviation present. No thyromegaly present.  Cardiovascular: Normal rate, regular rhythm, normal heart sounds and intact distal pulses.  Exam reveals no gallop and no friction rub.   No murmur heard. Pulmonary/Chest: Effort normal and breath sounds normal. No respiratory distress. She has no wheezes. She has no rales. She exhibits no tenderness.  Abdominal: Bowel sounds are normal. She exhibits distension. She exhibits no mass. There is tenderness. There is guarding. There is no rebound.  CVA tenderness Abdomen moderately distended, but skin soft, pt does not tolerate light palpation Firm hernia superior to umbilicus  Musculoskeletal: Normal range of motion. She exhibits no edema or tenderness.  Lymphadenopathy:    She has no cervical adenopathy.  Neurological: She is alert and oriented to person, place, and time. She has normal reflexes. No cranial nerve deficit. She exhibits normal muscle tone. Coordination normal.  Skin: Skin is warm and dry. No rash noted. She is not diaphoretic. No erythema. No pallor.  Psychiatric: She has a normal mood and affect. Her behavior is normal. Judgment and thought content normal.  Nursing note and vitals reviewed.   ED Course  Procedures (including critical care time) Labs Review Labs Reviewed  COMPREHENSIVE METABOLIC  PANEL - Abnormal; Notable for the following:    Glucose, Bld 112 (*)    Calcium 8.3 (*)    Albumin 2.5 (*)    AST 141 (*)    ALT 62 (*)    Alkaline Phosphatase 196 (*)    Total Bilirubin 3.8 (*)    All other components within normal limits  CBC - Abnormal; Notable for the following:    RBC 3.59 (*)    MCV 100.8 (*)    MCH 35.7 (*)    Platelets 122 (*)    All other components within normal limits  HEPATITIS PANEL, ACUTE - Abnormal; Notable for the following:    HCV Ab >11.0 (*)    All other components within normal limits  LACTATE DEHYDROGENASE, BODY FLUID - Abnormal; Notable for the following:    LD, Fluid 104 (*)    All other components within normal limits  URINALYSIS, ROUTINE W REFLEX MICROSCOPIC (NOT AT [facility]) - Abnormal; Notable for the following:    Color, Urine ORANGE (*)    Specific Gravity, Urine >1.046 (*)    Bilirubin Urine MODERATE (*)    Nitrite POSITIVE (*)    Leukocytes, UA TRACE (*)  All other components within normal limits  URINE RAPID DRUG SCREEN, HOSP PERFORMED - Abnormal; Notable for the following:    Opiates POSITIVE (*)    Cocaine POSITIVE (*)    Tetrahydrocannabinol POSITIVE (*)    All other components within normal limits  BODY FLUID CELL COUNT WITH DIFFERENTIAL - Abnormal; Notable for the following:    Color, Fluid YELLOW (*)    Appearance, Fluid CLOUDY (*)    WBC, Fluid 4778 (*)    Neutrophil Count, Fluid 93 (*)    Monocyte-Macrophage-Serous Fluid 4 (*)    All other components within normal limits  CBC - Abnormal; Notable for the following:    WBC 13.3 (*)    RBC 3.18 (*)    Hemoglobin 11.5 (*)    HCT 32.0 (*)    MCV 100.6 (*)    MCH 36.2 (*)    Platelets 93 (*)    All other components within normal limits  COMPREHENSIVE METABOLIC PANEL - Abnormal; Notable for the following:    Sodium 132 (*)    Potassium 3.3 (*)    Glucose, Bld 116 (*)    Calcium 7.6 (*)    Albumin 2.2 (*)    AST 108 (*)    Alkaline Phosphatase 133 (*)    Total  Bilirubin 5.3 (*)    All other components within normal limits  MAGNESIUM - Abnormal; Notable for the following:    Magnesium 1.5 (*)    All other components within normal limits  AMMONIA - Abnormal; Notable for the following:    Ammonia 58 (*)    All other components within normal limits  VITAMIN B12 - Abnormal; Notable for the following:    Vitamin B-12 1081 (*)    All other components within normal limits  PROTIME-INR - Abnormal; Notable for the following:    Prothrombin Time 18.7 (*)    INR 1.63 (*)    All other components within normal limits  URINE MICROSCOPIC-ADD ON - Abnormal; Notable for the following:    Squamous Epithelial / LPF 6-30 (*)    Bacteria, UA FEW (*)    All other components within normal limits  CBC - Abnormal; Notable for the following:    RBC 2.95 (*)    Hemoglobin 10.7 (*)    HCT 30.3 (*)    MCV 102.7 (*)    MCH 36.3 (*)    Platelets 78 (*)    All other components within normal limits  COMPREHENSIVE METABOLIC PANEL - Abnormal; Notable for the following:    Glucose, Bld 106 (*)    Calcium 7.9 (*)    Total Protein 5.9 (*)    Albumin 1.9 (*)    AST 79 (*)    Alkaline Phosphatase 140 (*)    Total Bilirubin 2.9 (*)    Anion gap 4 (*)    All other components within normal limits  AMMONIA - Abnormal; Notable for the following:    Ammonia 55 (*)    All other components within normal limits  HEPATITIS C VRS RNA DETECT BY PCR-QUAL - Abnormal; Notable for the following:    Hepatitis C Vrs RNA by PCR-Qual Positive (*)    All other components within normal limits  AFP TUMOR MARKER - Abnormal; Notable for the following:    AFP-Tumor Marker 26.2 (*)    All other components within normal limits  CBC - Abnormal; Notable for the following:    RBC 3.11 (*)    Hemoglobin 11.2 (*)  HCT 31.9 (*)    MCV 102.6 (*)    MCH 36.0 (*)    Platelets 87 (*)    All other components within normal limits  COMPREHENSIVE METABOLIC PANEL - Abnormal; Notable for the  following:    Sodium 134 (*)    Calcium 7.9 (*)    Total Protein 6.1 (*)    Albumin 1.9 (*)    AST 79 (*)    Alkaline Phosphatase 148 (*)    Total Bilirubin 2.6 (*)    Anion gap 4 (*)    All other components within normal limits  PROTIME-INR - Abnormal; Notable for the following:    Prothrombin Time 21.2 (*)    INR 1.92 (*)    All other components within normal limits  CBC - Abnormal; Notable for the following:    WBC 3.7 (*)    RBC 3.04 (*)    Hemoglobin 10.9 (*)    HCT 31.2 (*)    MCV 102.6 (*)    MCH 35.9 (*)    Platelets 91 (*)    All other components within normal limits  COMPREHENSIVE METABOLIC PANEL - Abnormal; Notable for the following:    Calcium 8.2 (*)    Total Protein 6.2 (*)    Albumin 1.9 (*)    AST 87 (*)    Alkaline Phosphatase 148 (*)    Total Bilirubin 2.2 (*)    All other components within normal limits  MAGNESIUM - Abnormal; Notable for the following:    Magnesium 1.6 (*)    All other components within normal limits  PROTIME-INR - Abnormal; Notable for the following:    Prothrombin Time 21.8 (*)    INR 1.99 (*)    All other components within normal limits  CBC - Abnormal; Notable for the following:    WBC 3.6 (*)    RBC 3.12 (*)    Hemoglobin 10.9 (*)    HCT 31.5 (*)    MCV 101.0 (*)    MCH 34.9 (*)    Platelets 83 (*)    All other components within normal limits  COMPREHENSIVE METABOLIC PANEL - Abnormal; Notable for the following:    Calcium 7.6 (*)    Total Protein 5.6 (*)    Albumin 1.8 (*)    AST 95 (*)    Alkaline Phosphatase 149 (*)    Total Bilirubin 2.2 (*)    Anion gap 3 (*)    All other components within normal limits  MAGNESIUM - Abnormal; Notable for the following:    Magnesium 1.6 (*)    All other components within normal limits  PROTIME-INR - Abnormal; Notable for the following:    Prothrombin Time 24.2 (*)    INR 2.29 (*)    All other components within normal limits  CBC - Abnormal; Notable for the following:    RBC  3.03 (*)    Hemoglobin 11.0 (*)    HCT 31.1 (*)    MCV 102.6 (*)    MCH 36.3 (*)    Platelets 90 (*)    All other components within normal limits  COMPREHENSIVE METABOLIC PANEL - Abnormal; Notable for the following:    Sodium 134 (*)    Calcium 7.5 (*)    Total Protein 5.4 (*)    Albumin 1.6 (*)    AST 97 (*)    Alkaline Phosphatase 138 (*)    Total Bilirubin 1.6 (*)    Anion gap 4 (*)    All other  components within normal limits  PROTIME-INR - Abnormal; Notable for the following:    Prothrombin Time 23.9 (*)    INR 2.16 (*)    All other components within normal limits  MAGNESIUM - Abnormal; Notable for the following:    Magnesium 1.6 (*)    All other components within normal limits  BODY FLUID CULTURE  MRSA PCR SCREENING  CULTURE, BLOOD (ROUTINE X 2)  CULTURE, BLOOD (ROUTINE X 2)  LIPASE, BLOOD  GLUCOSE, PERITONEAL FLUID  PROTEIN, BODY FLUID  ALBUMIN, FLUID  ETHANOL  HIV ANTIBODY (ROUTINE TESTING)  FOLATE  LACTIC ACID, PLASMA  LACTIC ACID, PLASMA  PROCALCITONIN  HCV RNA QUANT  MAGNESIUM  CYTOLOGY - NON PAP   Results for orders placed or performed during the hospital encounter of 05/24/15  Body fluid culture  Result Value Ref Range   Specimen Description PERITONEAL CAVITY    Special Requests Normal    Gram Stain      MODERATE WBC PRESENT,BOTH PMN AND MONONUCLEAR NO ORGANISMS SEEN    Culture      NO GROWTH 3 DAYS Performed at [facility]    Report Status 05/28/2015 FINAL   MRSA PCR Screening  Result Value Ref Range   MRSA by PCR NEGATIVE NEGATIVE  Culture, blood (routine x 2)  Result Value Ref Range   Specimen Description BLOOD RIGHT ARM    Special Requests BOTTLES DRAWN AEROBIC AND ANAEROBIC [place]    Culture      NO GROWTH 5 DAYS Performed at [facility]    Report Status 05/30/2015 FINAL   Culture, blood (routine x 2)  Result Value Ref Range   Specimen Description BLOOD RIGHT HAND    Special Requests IN PEDIATRIC BOTTLE 3CC     Culture      NO GROWTH 5 DAYS Performed at [facility]    Report Status 05/30/2015 FINAL   Lipase, blood  Result Value Ref Range   Lipase 20 11 - 51 U/L  Comprehensive metabolic panel  Result Value Ref Range   Sodium 138 135 - 145 mmol/L   Potassium 4.0 3.5 - 5.1 mmol/L   Chloride 106 101 - 111 mmol/L   CO2 24 22 - 32 mmol/L   Glucose, Bld 112 (H) 65 - 99 mg/dL   BUN 8 6 - 20 mg/dL   Creatinine, Ser 0.61 0.44 - 1.00 mg/dL   Calcium 8.3 (L) 8.9 - 10.3 mg/dL   Total Protein 7.7 6.5 - 8.1 g/dL   Albumin 2.5 (L) 3.5 - 5.0 g/dL   AST 141 (H) 15 - 41 U/L   ALT 62 (H) 14 - 54 U/L   Alkaline Phosphatase 196 (H) 38 - 126 U/L   Total Bilirubin 3.8 (H) 0.3 - 1.2 mg/dL   GFR calc non Af Amer >60 >60 mL/min   GFR calc Af Amer >60 >60 mL/min   Anion gap 8 5 - 15  CBC  Result Value Ref Range   WBC 4.8 4.0 - 10.5 K/uL   RBC 3.59 (L) 3.87 - 5.11 MIL/uL   Hemoglobin 12.8 12.0 - 15.0 g/dL   HCT 36.2 36.0 - 46.0 %   MCV 100.8 (H) 78.0 - 100.0 fL   MCH 35.7 (H) 26.0 - 34.0 pg   MCHC 35.4 30.0 - 36.0 g/dL   RDW 14.9 11.5 - 15.5 %   Platelets 122 (L) 150 - 400 K/uL  Hepatitis panel, acute  Result Value Ref Range   Hepatitis B Surface Ag  Negative Negative   HCV Ab >11.0 (H) 0.0 - 0.9 s/co ratio   Hep A IgM Negative Negative   Hep B C IgM Negative Negative  Lactate dehydrogenase, Peritoneal fluid  Result Value Ref Range   LD, Fluid 104 (H) 3 - 23 U/L   Fluid Type-FLDH PERITONEAL CAVITY   Glucose, Peritoneal fluid  Result Value Ref Range   Glucose, Peritoneal Fluid 102 mg/dL  Protein, Peritoneal fluid  Result Value Ref Range   Total protein, fluid <3.0 g/dL   Fluid Type-FTP PERITONEAL CAVITY   Albumin, Peritoneal fluid  Result Value Ref Range   Albumin, Fluid <1.0 g/dL   Fluid Type-FALB PERITONEAL CAVITY   Urinalysis, Routine w reflex microscopic (not at [facility])  Result Value Ref Range   Color, Urine ORANGE (A) YELLOW   APPearance CLEAR CLEAR   Specific Gravity, Urine  >1.046 (H) 1.005 - 1.030   pH 5.5 5.0 - 8.0   Glucose, UA NEGATIVE NEGATIVE mg/dL   Hgb urine dipstick NEGATIVE NEGATIVE   Bilirubin Urine MODERATE (A) NEGATIVE   Ketones, ur NEGATIVE NEGATIVE mg/dL   Protein, ur NEGATIVE NEGATIVE mg/dL   Nitrite POSITIVE (A) NEGATIVE   Leukocytes, UA TRACE (A) NEGATIVE  Urine rapid drug screen (hosp performed)  Result Value Ref Range   Opiates POSITIVE (A) NONE DETECTED   Cocaine POSITIVE (A) NONE DETECTED   Benzodiazepines NONE DETECTED NONE DETECTED   Amphetamines NONE DETECTED NONE DETECTED   Tetrahydrocannabinol POSITIVE (A) NONE DETECTED   Barbiturates NONE DETECTED NONE DETECTED  Ethanol  Result Value Ref Range   Alcohol, Ethyl (B) <5 <5 mg/dL  Body fluid cell count with differential  Result Value Ref Range   Fluid Type-FCT PERITONEAL CAVITY    Color, Fluid YELLOW (A) YELLOW   Appearance, Fluid CLOUDY (A) CLEAR   WBC, Fluid 4778 (H) 0 - 1000 cu mm   Neutrophil Count, Fluid 93 (H) 0 - 25 %   Lymphs, Fluid 3 %   Monocyte-Macrophage-Serous Fluid 4 (L) 50 - 90 %   Other Cells, Fluid CORRELATE WITH CYTOLOGY. %  HIV antibody  Result Value Ref Range   HIV Screen 4th Generation wRfx Non Reactive Non Reactive  CBC  Result Value Ref Range   WBC 13.3 (H) 4.0 - 10.5 K/uL   RBC 3.18 (L) 3.87 - 5.11 MIL/uL   Hemoglobin 11.5 (L) 12.0 - 15.0 g/dL   HCT 32.0 (L) 36.0 - 46.0 %   MCV 100.6 (H) 78.0 - 100.0 fL   MCH 36.2 (H) 26.0 - 34.0 pg   MCHC 35.9 30.0 - 36.0 g/dL   RDW 14.5 11.5 - 15.5 %   Platelets 93 (L) 150 - 400 K/uL  Comprehensive metabolic panel  Result Value Ref Range   Sodium 132 (L) 135 - 145 mmol/L   Potassium 3.3 (L) 3.5 - 5.1 mmol/L   Chloride 103 101 - 111 mmol/L   CO2 24 22 - 32 mmol/L   Glucose, Bld 116 (H) 65 - 99 mg/dL   BUN 10 6 - 20 mg/dL   Creatinine, Ser 0.69 0.44 - 1.00 mg/dL   Calcium 7.6 (L) 8.9 - 10.3 mg/dL   Total Protein 6.9 6.5 - 8.1 g/dL   Albumin 2.2 (L) 3.5 - 5.0 g/dL   AST 108 (H) 15 - 41 U/L   ALT 52  14 - 54 U/L   Alkaline Phosphatase 133 (H) 38 - 126 U/L   Total Bilirubin 5.3 (H) 0.3 - 1.2 mg/dL   GFR  calc non Af Amer >60 >60 mL/min   GFR calc Af Amer >60 >60 mL/min   Anion gap 5 5 - 15  Magnesium  Result Value Ref Range   Magnesium 1.5 (L) 1.7 - 2.4 mg/dL  Ammonia  Result Value Ref Range   Ammonia 58 (H) 9 - 35 umol/L  Vitamin B12  Result Value Ref Range   Vitamin B-12 1081 (H) 180 - 914 pg/mL  Folate  Result Value Ref Range   Folate 12.6 >5.9 ng/mL  Protime-INR  Result Value Ref Range   Prothrombin Time 18.7 (H) 11.6 - 15.2 seconds   INR 1.63 (H) 0.00 - 1.49  Urine microscopic-add on  Result Value Ref Range   Squamous Epithelial / LPF 6-30 (A) NONE SEEN   WBC, UA 6-30 0 - 5 WBC/hpf   RBC / HPF 0-5 0 - 5 RBC/hpf   Bacteria, UA FEW (A) NONE SEEN   Sperm, UA PRESENT   Lactic acid, plasma  Result Value Ref Range   Lactic Acid, Venous 1.0 0.5 - 2.0 mmol/L  Lactic acid, plasma  Result Value Ref Range   Lactic Acid, Venous 1.2 0.5 - 2.0 mmol/L  Procalcitonin  Result Value Ref Range   Procalcitonin 0.23 ng/mL  HCV RNA quant  Result Value Ref Range   HCV Quantitative 1040000 >50 IU/mL   HCV Quantitative Log 6.017 >1.70 log10 IU/mL   Test Information Comment   CBC  Result Value Ref Range   WBC 8.8 4.0 - 10.5 K/uL   RBC 2.95 (L) 3.87 - 5.11 MIL/uL   Hemoglobin 10.7 (L) 12.0 - 15.0 g/dL   HCT 30.3 (L) 36.0 - 46.0 %   MCV 102.7 (H) 78.0 - 100.0 fL   MCH 36.3 (H) 26.0 - 34.0 pg   MCHC 35.3 30.0 - 36.0 g/dL   RDW 14.9 11.5 - 15.5 %   Platelets 78 (L) 150 - 400 K/uL  Comprehensive metabolic panel  Result Value Ref Range   Sodium 135 135 - 145 mmol/L   Potassium 4.0 3.5 - 5.1 mmol/L   Chloride 104 101 - 111 mmol/L   CO2 27 22 - 32 mmol/L   Glucose, Bld 106 (H) 65 - 99 mg/dL   BUN 8 6 - 20 mg/dL   Creatinine, Ser 0.74 0.44 - 1.00 mg/dL   Calcium 7.9 (L) 8.9 - 10.3 mg/dL   Total Protein 5.9 (L) 6.5 - 8.1 g/dL   Albumin 1.9 (L) 3.5 - 5.0 g/dL   AST 79 (H) 15 - 41  U/L   ALT 41 14 - 54 U/L   Alkaline Phosphatase 140 (H) 38 - 126 U/L   Total Bilirubin 2.9 (H) 0.3 - 1.2 mg/dL   GFR calc non Af Amer >60 >60 mL/min   GFR calc Af Amer >60 >60 mL/min   Anion gap 4 (L) 5 - 15  Magnesium  Result Value Ref Range   Magnesium 1.7 1.7 - 2.4 mg/dL  Ammonia  Result Value Ref Range   Ammonia 55 (H) 9 - 35 umol/L  Hepatitis c vrs RNA detect by PCR-qual  Result Value Ref Range   Hepatitis C Vrs RNA by PCR-Qual Positive (A) Negative  AFP tumor marker  Result Value Ref Range   AFP-Tumor Marker 26.2 (H) 0.0 - 8.3 ng/mL  CBC  Result Value Ref Range   WBC 5.1 4.0 - 10.5 K/uL   RBC 3.11 (L) 3.87 - 5.11 MIL/uL   Hemoglobin 11.2 (L) 12.0 - 15.0 g/dL  HCT 31.9 (L) 36.0 - 46.0 %   MCV 102.6 (H) 78.0 - 100.0 fL   MCH 36.0 (H) 26.0 - 34.0 pg   MCHC 35.1 30.0 - 36.0 g/dL   RDW 14.9 11.5 - 15.5 %   Platelets 87 (L) 150 - 400 K/uL  Comprehensive metabolic panel  Result Value Ref Range   Sodium 134 (L) 135 - 145 mmol/L   Potassium 4.0 3.5 - 5.1 mmol/L   Chloride 104 101 - 111 mmol/L   CO2 26 22 - 32 mmol/L   Glucose, Bld 88 65 - 99 mg/dL   BUN 7 6 - 20 mg/dL   Creatinine, Ser 0.67 0.44 - 1.00 mg/dL   Calcium 7.9 (L) 8.9 - 10.3 mg/dL   Total Protein 6.1 (L) 6.5 - 8.1 g/dL   Albumin 1.9 (L) 3.5 - 5.0 g/dL   AST 79 (H) 15 - 41 U/L   ALT 41 14 - 54 U/L   Alkaline Phosphatase 148 (H) 38 - 126 U/L   Total Bilirubin 2.6 (H) 0.3 - 1.2 mg/dL   GFR calc non Af Amer >60 >60 mL/min   GFR calc Af Amer >60 >60 mL/min   Anion gap 4 (L) 5 - 15  Protime-INR  Result Value Ref Range   Prothrombin Time 21.2 (H) 11.6 - 15.2 seconds   INR 1.92 (H) 0.00 - 1.49  CBC  Result Value Ref Range   WBC 3.7 (L) 4.0 - 10.5 K/uL   RBC 3.04 (L) 3.87 - 5.11 MIL/uL   Hemoglobin 10.9 (L) 12.0 - 15.0 g/dL   HCT 31.2 (L) 36.0 - 46.0 %   MCV 102.6 (H) 78.0 - 100.0 fL   MCH 35.9 (H) 26.0 - 34.0 pg   MCHC 34.9 30.0 - 36.0 g/dL   RDW 14.9 11.5 - 15.5 %   Platelets 91 (L) 150 - 400 K/uL   Comprehensive metabolic panel  Result Value Ref Range   Sodium 136 135 - 145 mmol/L   Potassium 4.3 3.5 - 5.1 mmol/L   Chloride 101 101 - 111 mmol/L   CO2 29 22 - 32 mmol/L   Glucose, Bld 94 65 - 99 mg/dL   BUN 8 6 - 20 mg/dL   Creatinine, Ser 0.87 0.44 - 1.00 mg/dL   Calcium 8.2 (L) 8.9 - 10.3 mg/dL   Total Protein 6.2 (L) 6.5 - 8.1 g/dL   Albumin 1.9 (L) 3.5 - 5.0 g/dL   AST 87 (H) 15 - 41 U/L   ALT 40 14 - 54 U/L   Alkaline Phosphatase 148 (H) 38 - 126 U/L   Total Bilirubin 2.2 (H) 0.3 - 1.2 mg/dL   GFR calc non Af Amer >60 >60 mL/min   GFR calc Af Amer >60 >60 mL/min   Anion gap 6 5 - 15  Magnesium  Result Value Ref Range   Magnesium 1.6 (L) 1.7 - 2.4 mg/dL  Protime-INR  Result Value Ref Range   Prothrombin Time 21.8 (H) 11.6 - 15.2 seconds   INR 1.99 (H) 0.00 - 1.49  CBC  Result Value Ref Range   WBC 3.6 (L) 4.0 - 10.5 K/uL   RBC 3.12 (L) 3.87 - 5.11 MIL/uL   Hemoglobin 10.9 (L) 12.0 - 15.0 g/dL   HCT 31.5 (L) 36.0 - 46.0 %   MCV 101.0 (H) 78.0 - 100.0 fL   MCH 34.9 (H) 26.0 - 34.0 pg   MCHC 34.6 30.0 - 36.0 g/dL   RDW 14.6 11.5 - 15.5 %  Platelets 83 (L) 150 - 400 K/uL  Comprehensive metabolic panel  Result Value Ref Range   Sodium 135 135 - 145 mmol/L   Potassium 3.7 3.5 - 5.1 mmol/L   Chloride 104 101 - 111 mmol/L   CO2 28 22 - 32 mmol/L   Glucose, Bld 95 65 - 99 mg/dL   BUN 8 6 - 20 mg/dL   Creatinine, Ser 0.66 0.44 - 1.00 mg/dL   Calcium 7.6 (L) 8.9 - 10.3 mg/dL   Total Protein 5.6 (L) 6.5 - 8.1 g/dL   Albumin 1.8 (L) 3.5 - 5.0 g/dL   AST 95 (H) 15 - 41 U/L   ALT 40 14 - 54 U/L   Alkaline Phosphatase 149 (H) 38 - 126 U/L   Total Bilirubin 2.2 (H) 0.3 - 1.2 mg/dL   GFR calc non Af Amer >60 >60 mL/min   GFR calc Af Amer >60 >60 mL/min   Anion gap 3 (L) 5 - 15  Magnesium  Result Value Ref Range   Magnesium 1.6 (L) 1.7 - 2.4 mg/dL  Protime-INR  Result Value Ref Range   Prothrombin Time 24.2 (H) 11.6 - 15.2 seconds   INR 2.29 (H) 0.00 - 1.49  CBC   Result Value Ref Range   WBC 4.3 4.0 - 10.5 K/uL   RBC 3.03 (L) 3.87 - 5.11 MIL/uL   Hemoglobin 11.0 (L) 12.0 - 15.0 g/dL   HCT 31.1 (L) 36.0 - 46.0 %   MCV 102.6 (H) 78.0 - 100.0 fL   MCH 36.3 (H) 26.0 - 34.0 pg   MCHC 35.4 30.0 - 36.0 g/dL   RDW 15.0 11.5 - 15.5 %   Platelets 90 (L) 150 - 400 K/uL  Comprehensive metabolic panel  Result Value Ref Range   Sodium 134 (L) 135 - 145 mmol/L   Potassium 3.9 3.5 - 5.1 mmol/L   Chloride 102 101 - 111 mmol/L   CO2 28 22 - 32 mmol/L   Glucose, Bld 94 65 - 99 mg/dL   BUN 10 6 - 20 mg/dL   Creatinine, Ser 0.79 0.44 - 1.00 mg/dL   Calcium 7.5 (L) 8.9 - 10.3 mg/dL   Total Protein 5.4 (L) 6.5 - 8.1 g/dL   Albumin 1.6 (L) 3.5 - 5.0 g/dL   AST 97 (H) 15 - 41 U/L   ALT 36 14 - 54 U/L   Alkaline Phosphatase 138 (H) 38 - 126 U/L   Total Bilirubin 1.6 (H) 0.3 - 1.2 mg/dL   GFR calc non Af Amer >60 >60 mL/min   GFR calc Af Amer >60 >60 mL/min   Anion gap 4 (L) 5 - 15  Protime-INR  Result Value Ref Range   Prothrombin Time 23.9 (H) 11.6 - 15.2 seconds   INR 2.16 (H) 0.00 - 1.49  Magnesium  Result Value Ref Range   Magnesium 1.6 (L) 1.7 - 2.4 mg/dL   Ct Abdomen Pelvis W Contrast  05/24/2015  CLINICAL DATA:  Midline upper abdominal pain for 24 hours, history of hiatal hernia EXAM: CT ABDOMEN AND PELVIS WITH CONTRAST TECHNIQUE: Multidetector CT imaging of the abdomen and pelvis was performed using the standard protocol following bolus administration of intravenous contrast. CONTRAST:  125m ISOVUE-300 IOPAMIDOL (ISOVUE-300) INJECTION 61% COMPARISON:  05/07/2015 FINDINGS: Lower chest:  The lung bases are unremarkable. Hepatobiliary: Again noted cirrhotic liver. There is perihepatic and perisplenic ascites again noted. Moderate ascites bilateral paracolic gutters again noted. The patient is status postcholecystectomy. No focal hepatic mass. Pancreas: Enhanced pancreas  is unremarkable. Spleen: Mild splenomegaly is stable. Adrenals/Urinary Tract: No  adrenal gland mass. Kidneys are symmetrical in size and enhancement. No hydronephrosis or hydroureter. Delayed renal images shows bilateral renal symmetrical excretion. Bilateral visualized proximal ureter is unremarkable. The urinary bladder is unremarkable. Stomach/Bowel: There is no gastric outlet obstruction. Mild distended distal small bowel loops with some fluid and air-fluid levels are noted lower abdomen see axial image 42. Findings suspicious for enteritis. There is thickening of the cecal wall and proximal right colon. Subtle mild enhancement of the right colon mucosa. Nonspecific colitis cannot be excluded. Clinical correlation is necessary. The appendix is not identified. There is no transition point in caliber of small bowel to suggest small bowel obstruction. Colonic diverticula are noted descending colon and proximal sigmoid colon. There is no evidence of acute diverticulitis or distal colitis. Vascular/Lymphatic: Mild atherosclerotic calcifications of abdominal aorta and iliac arteries. No mesenteric or retroperitoneal adenopathy. Portal vein measures 1.4 cm in diameter the probable due to portal hypertension. Reproductive: The uterus and ovaries are unremarkable. Other: There is moderate ascites bilateral paracolic gutters. Stable umbilical hernia containing fluid. Moderate pelvic ascites is noted. Musculoskeletal: No destructive bony lesions are noted. Sagittal images of the spine shows mild degenerative changes thoracolumbar spine. IMPRESSION: 1. Again noted cirrhosis of the liver and moderate pelvic and abdominal ascites probable due to portal hypertension. Mild splenomegaly. 2. Mild fluid distended with some air-fluid levels distal small bowel loops probable due to ileus or enteritis. No transition point in caliber of small bowel. There is some thickening of cecal wall and proximal right colon. Mild enhancement of right colon mucosa. Findings highly suspicious for nonspecific colitis.  Enterocolitis cannot be excluded. The appendix is not identified. 3. Unremarkable uterus and ovaries. 4. Stable umbilical hernia containing fluid. 5. No hydronephrosis or hydroureter. Electronically Signed   By: Lahoma Crocker M.D.   On: 05/24/2015 14:48   Ct Abdomen Pelvis W Contrast  05/07/2015  CLINICAL DATA:  Abdominal pain and vomiting, abdominal distention and constipation. History of cirrhosis, cholecystectomy, hypertension. EXAM: CT ABDOMEN AND PELVIS WITH CONTRAST TECHNIQUE: Multidetector CT imaging of the abdomen and pelvis was performed using the standard protocol following bolus administration of intravenous contrast. CONTRAST:  100 cc ISOVUE-300 IOPAMIDOL (ISOVUE-300) INJECTION 61% COMPARISON:  Abdominal radiograph May 07, 2015 at 0343 hours and CT abdomen and pelvis March 19, 2015 FINDINGS: LUNG BASES: Included view of the lung bases are clear. Visualized heart and pericardium are unremarkable. SOLID ORGANS: Similarly nodular liver with enlarged LEFT lobe consistent with known cirrhosis. Recannulized paraumbilical vein. Patent portal vein. Mild splenomegaly. Normal appearance of the pancreas and adrenal glands. Status postcholecystectomy. GASTROINTESTINAL TRACT: The stomach, small and large bowel are normal in course and caliber without inflammatory changes. Mild colonic diverticulosis. Mildly edematous small bowel consistent with portal hypertension. The appendix is not discretely identified, however there are no inflammatory changes in the right lower quadrant. KIDNEYS/ URINARY TRACT: Kidneys are orthotopic, demonstrating symmetric enhancement. No nephrolithiasis, hydronephrosis or solid renal masses. The unopacified ureters are normal in course and caliber. Delayed imaging through the kidneys demonstrates symmetric prompt contrast excretion within the proximal urinary collecting system. Urinary bladder is partially distended and unremarkable. PERITONEUM/RETROPERITONEUM: Aortoiliac vessels are normal in  course and caliber, mild calcific atherosclerosis. No lymphadenopathy by CT size criteria. Internal reproductive organs are unremarkable. Similar amount of moderate ascites. Similar mesenteric edema. Stable moderate fluid-filled umbilical hernia. SOFT TISSUE/OSSEOUS STRUCTURES: Non-suspicious.  Mild anasarca. IMPRESSION: Stable cirrhosis and sequelae of portal hypertension including moderate  ascites. Electronically Signed   By: Elon Alas M.D.   On: 05/07/2015 05:54   US Abdomen Limited  05/28/2015  CLINICAL DATA:  History of hepatic cirrhosis and SBP. Request has been made for a diagnostic paracentesis. EXAM: LIMITED ABDOMEN ULTRASOUND FOR ASCITES TECHNIQUE: Limited ultrasound survey for ascites was performed in all four abdominal quadrants. COMPARISON:  None. FINDINGS: Minimal right upper quadrant ascites noted just over the liver. An initial attempt to perform a paracentesis was done ; however, when the patient with tense her abdominal muscles the fluid pocket would completely collapse and her liver and bowel were right up next to her abdominal wall. IMPRESSION: Minimal right upper quadrant abdominal ascites. Unsafe window to proceed with paracentesis. Read by: Saverio Danker, PA-C Electronically Signed   By: Lucrezia Europe M.D.   On: 05/28/2015 12:33   US Paracentesis  05/27/2015  INDICATION: ascites EXAM: ULTRASOUND-GUIDED PARACENTESIS COMPARISON:  None. MEDICATIONS: 10 cc 1% lidocaine COMPLICATIONS: None immediate. TECHNIQUE: Informed written consent was obtained from the patient after a discussion of the risks, benefits and alternatives to treatment. A timeout was performed prior to the initiation of the procedure. Initial ultrasound scanning demonstrates a very small amount of ascites within the right lower abdominal quadrant close to liver. The right lower abdomen was prepped and draped in the usual sterile fashion. 1% lidocaine with epinephrine was used for local anesthesia. Under direct ultrasound  guidance, a 19 gauge, 7-cm, Yueh catheter was introduced. An ultrasound image was saved for documentation purposed. Although was able to access small pocket of fluid only approximately 1 cc yellow fluid was able to be aspirated. Subsequent ultrasound imaging failed to delineate a sizable pocket amenable to ultrasound-guided aspiration. The paracentesis was not performed. The catheter was removed and a dressing was applied. The patient tolerated the procedure well without immediate post procedural complication. FINDINGS: A total of approximately 1 cc of yellow fluid was removed. Given small size, no fluid was sent to the laboratory for analysis. IMPRESSION: Unsuccessful ultrasound-guided paracentesis yielding only 1 cc of peritoneal fluid. Given small size of peritoneal fluid, additional attempts at ultrasound-guided paracentesis were not performed. Read by: Lavonia Drafts [facility] Electronically Signed   By: Sandi Mariscal M.D.   On: 05/27/2015 11:23   Dg Chest Port 1 View  05/25/2015  CLINICAL DATA:  Sepsis.  History of hypertension and smoking. EXAM: PORTABLE CHEST 1 VIEW COMPARISON:  05/07/2015 FINDINGS: The cardiomediastinal silhouette is within normal limits. The lungs are well inflated and clear. There is no evidence of pleural effusion or pneumothorax. No acute osseous abnormality is identified. IMPRESSION: No active disease. Electronically Signed   By: Logan Bores M.D.   On: 05/25/2015 10:15   Dg Abd Acute W/chest  05/07/2015  CLINICAL DATA:  Severe abdominal pain. EXAM: DG ABDOMEN ACUTE W/ 1V CHEST COMPARISON:  Chest 12/19/2014.  CT abdomen 03/19/2015. FINDINGS: Shallow inspiration with mild atelectasis in the lung bases. No focal consolidation. Normal heart size and pulmonary vascularity. Scattered gas and stool throughout the colon. No small or large bowel distention. There is a single prominent gas-filled small bowel loop in the left mid abdomen with suggestion of fold thickening. This is not  significantly distended and likely represents focal ileus or possibly enteritis. No free intra-abdominal air. No abnormal air-fluid levels. Surgical clips in the right upper quadrant. Degenerative changes in the spine and hips. No radiopaque stones. IMPRESSION: Single prominent gas-filled nondilated small bowel loop in the left abdomen with suggestion of fold thickening may  represent localized ileus or enteritis. No findings of obstruction. Electronically Signed   By: Lucienne Capers M.D.   On: 05/07/2015 04:12     I have personally reviewed and evaluated these images and lab results as part of my medical decision-making.   EKG Interpretation None      MDM    Pt with several weeks of abdominal pain and distention, known umbilical hernia, pain worsened when she began vomiting 3x over the past 24 hours.  She is having normal BM's.  She has chart history of cirrhosis, however does not know anything about it, and after thorough chart review, acites is present in multiple scans over the past year, and none prior.  No work-up documented in chart.  Today pt's abdomen is mildly distended.  Pt is in severe pain with light palpation. CT scan reveals moderate ascites, stable umbilical hernia containing fluid, distended small bowel, with some air-fluid levels, thickening of cecal wall, proximal right colon - suggestive of enterocolitis/non-specific colitis, no evidence of diverticulitis, distal colitis, no obstruction or incarceration.  Liver cirrhotic, portal vein 1.4 cm, suggestive of portal HTN.    Suspect pt has worsening cirrhosis and possibly new gastroenteritis given sudden onset N/V yesterday and gradual onset of abdominal distention and pain.  Discussed pt with attending EDP, Dr. Johnney Killian, who stated pt needed liver worked up and would likely need admission.  Called hospitalist for admission, she requested ER paracentesis to assess fluid and determine SBP and need for antibiotics.  Dr. Johnney Killian  performed paracentesis in the ER and pt was later admitted.  Please see her documentation for further details of ER care/procedures.    Resulted at the time that Dr. Johnney Killian assumed care, pt basic lab work pertinent for mild thrombocytopenia (122), no leukocytosis, AST, ALT, ALK Pos and total bili elevated, albumin low, chemistry and blood counts otherwise unremarkable.  VSS, pt afebrile.  Paracentesis studies, hepatitis, UA/drug screen pending.  Final diagnoses:  Sepsis ([place])  SBP (spontaneous bacterial peritonitis) ([place])        Delsa Grana, PA-C 05/30/15 2353  Charlesetta Shanks, MD 05/31/15 [id number]

## 2015-05-25 ENCOUNTER — Inpatient Hospital Stay (HOSPITAL_COMMUNITY): Payer: Medicaid Other

## 2015-05-25 DIAGNOSIS — K7460 Unspecified cirrhosis of liver: Secondary | ICD-10-CM | POA: Insufficient documentation

## 2015-05-25 DIAGNOSIS — R188 Other ascites: Secondary | ICD-10-CM

## 2015-05-25 DIAGNOSIS — K7469 Other cirrhosis of liver: Secondary | ICD-10-CM

## 2015-05-25 DIAGNOSIS — K429 Umbilical hernia without obstruction or gangrene: Secondary | ICD-10-CM

## 2015-05-25 DIAGNOSIS — B182 Chronic viral hepatitis C: Secondary | ICD-10-CM | POA: Insufficient documentation

## 2015-05-25 DIAGNOSIS — F1910 Other psychoactive substance abuse, uncomplicated: Secondary | ICD-10-CM

## 2015-05-25 DIAGNOSIS — K652 Spontaneous bacterial peritonitis: Secondary | ICD-10-CM | POA: Insufficient documentation

## 2015-05-25 LAB — PROTIME-INR
INR: 1.63 — ABNORMAL HIGH (ref 0.00–1.49)
Prothrombin Time: 18.7 seconds — ABNORMAL HIGH (ref 11.6–15.2)

## 2015-05-25 LAB — COMPREHENSIVE METABOLIC PANEL
ALBUMIN: 2.2 g/dL — AB (ref 3.5–5.0)
ALT: 52 U/L (ref 14–54)
ANION GAP: 5 (ref 5–15)
AST: 108 U/L — ABNORMAL HIGH (ref 15–41)
Alkaline Phosphatase: 133 U/L — ABNORMAL HIGH (ref 38–126)
BILIRUBIN TOTAL: 5.3 mg/dL — AB (ref 0.3–1.2)
BUN: 10 mg/dL (ref 6–20)
CO2: 24 mmol/L (ref 22–32)
Calcium: 7.6 mg/dL — ABNORMAL LOW (ref 8.9–10.3)
Chloride: 103 mmol/L (ref 101–111)
Creatinine, Ser: 0.69 mg/dL (ref 0.44–1.00)
GLUCOSE: 116 mg/dL — AB (ref 65–99)
POTASSIUM: 3.3 mmol/L — AB (ref 3.5–5.1)
Sodium: 132 mmol/L — ABNORMAL LOW (ref 135–145)
TOTAL PROTEIN: 6.9 g/dL (ref 6.5–8.1)

## 2015-05-25 LAB — CBC
HEMATOCRIT: 32 % — AB (ref 36.0–46.0)
Hemoglobin: 11.5 g/dL — ABNORMAL LOW (ref 12.0–15.0)
MCH: 36.2 pg — AB (ref 26.0–34.0)
MCHC: 35.9 g/dL (ref 30.0–36.0)
MCV: 100.6 fL — ABNORMAL HIGH (ref 78.0–100.0)
Platelets: 93 10*3/uL — ABNORMAL LOW (ref 150–400)
RBC: 3.18 MIL/uL — ABNORMAL LOW (ref 3.87–5.11)
RDW: 14.5 % (ref 11.5–15.5)
WBC: 13.3 10*3/uL — ABNORMAL HIGH (ref 4.0–10.5)

## 2015-05-25 LAB — LACTIC ACID, PLASMA
LACTIC ACID, VENOUS: 1 mmol/L (ref 0.5–2.0)
Lactic Acid, Venous: 1.2 mmol/L (ref 0.5–2.0)

## 2015-05-25 LAB — AMMONIA: AMMONIA: 58 umol/L — AB (ref 9–35)

## 2015-05-25 LAB — MAGNESIUM: MAGNESIUM: 1.5 mg/dL — AB (ref 1.7–2.4)

## 2015-05-25 LAB — HEPATITIS PANEL, ACUTE
HEP A IGM: NEGATIVE
Hep B C IgM: NEGATIVE
Hepatitis B Surface Ag: NEGATIVE

## 2015-05-25 LAB — PROCALCITONIN: PROCALCITONIN: 0.23 ng/mL

## 2015-05-25 LAB — ETHANOL

## 2015-05-25 LAB — MRSA PCR SCREENING: MRSA BY PCR: NEGATIVE

## 2015-05-25 LAB — FOLATE: FOLATE: 12.6 ng/mL (ref >5.9)

## 2015-05-25 LAB — VITAMIN B12: VITAMIN B 12: 1081 pg/mL — AB (ref 180–914)

## 2015-05-25 MED ORDER — MAGNESIUM SULFATE 2 GM/50ML IV SOLN
2.0000 g | Freq: Once | INTRAVENOUS | Status: AC
  Administered 2015-05-25: 2 g via INTRAVENOUS
  Filled 2015-05-25: qty 50

## 2015-05-25 MED ORDER — DEXTROSE 5 % IV SOLN
2.0000 g | INTRAVENOUS | Status: DC
  Administered 2015-05-25 – 2015-05-29 (×5): 2 g via INTRAVENOUS
  Filled 2015-05-25 (×6): qty 2

## 2015-05-25 MED ORDER — POTASSIUM CHLORIDE CRYS ER 20 MEQ PO TBCR
40.0000 meq | EXTENDED_RELEASE_TABLET | Freq: Once | ORAL | Status: AC
  Administered 2015-05-25: 40 meq via ORAL
  Filled 2015-05-25 (×2): qty 2

## 2015-05-25 NOTE — Consult Note (Signed)
Consultation  Referring Provider:   Dr. Albertine Grates   Primary Care Physician:  [name], FNP Primary Gastroenterologist:  Gentry Fitz      Reason for Consultation:  Cirrhosis, Ascites SBP, Hep C         HPI:   Linda Vaughn is a 50 y.o. African American female with a history of alcohol use (reportedly quit 20 years ago), history of gallstones status post cholecystectomy in 2012 and cirrhosis found on imaging 08/2014, who presented to the ER on 05/24/15 for a complaint of increasing abdominal pain. Since time of admission patient has been found to have a positive hep C antibody and SBP.  Today, the patient explains that she has been dealing with a umbilical hernia for the past 3 months. She describes that pain would "come and go", until yesterday morning when she awoke at 3 AM to go to the bathroom and had excruciating abdominal pain in the area of her hernia. She "walked the halls for a a while because she thought it was gas", and then was able to go back to sleep. Upon waking up she felt "sick all over", around 8:00 in the morning. She tells me that she then became very lightheaded and "felt like I was going to pass out", and her friend called the ambulance. She explains that she has had some chills and occasional leg swelling. She also describes feeling short of breath when she is "overfull", for the past few months.  Patient also describes a history of recently following with "a doctor regarding my legs". She tells me that her right foot has had some numbness in the area of her toes over the past 3-4 weeks and she is receiving further evaluation for this.  Per referring physician's exam pt did describe nausea and vomiting the day before admission, though this was not discussed during my interview.  Patient does deny any acute drug use during time of interview, though drug screen was positive for opiates, cocaine and tetrahydrocannabinol. Labs at time of  consult show a decreased  sodium at 132, potassium low at 3.3, calcium low at 7.6 alkaline phosphatase elevated at 133, AST elevated at 108, ALT normal at 52 and total bilirubin increased to 5.3. CT showed cirrhosis again noted and moderate pelvic and abdominal ascites probable due to portal hypertension. Mild splenomegaly. Mild fluid distended small bowel loops with some thickening of the cecal wall and proximal right colon as well as mild enhancement of the right colon mucosa. Stable umbilical hernia containing fluid. Ascitic fluid didn't show albumin less than 1, total protein less than 3 and increased white blood cells at 4778 as well as a neutrophil count increased at 93.  Patient denies any other GI symptoms.  Past Medical History  Diagnosis Date  . Arthritis   . Gallstones   . Hypertension     Past Surgical History  Procedure Laterality Date  . Ankle surgery    . Carpal tunnel release    . Ankle surgery    . Cesarean section    . Cholecystectomy      Family History  Problem Relation Age of Onset  . Cancer Mother   . Rheum arthritis Father   . Diabetes Other     Social History  Substance Use Topics  . Smoking status: Current Every Day Smoker -- 0.50 packs/day  . Smokeless tobacco: Never Used  . Alcohol Use: No     Comment: occ    Prior  to Admission medications   Medication Sig Start Date End Date Taking? Authorizing Provider  oxyCODONE (ROXICODONE) 5 MG immediate release tablet Take 1 tablet (5 mg total) by mouth every 6 (six) hours as needed for severe pain. Patient not taking: Reported on 05/07/2015 03/19/15   Doug Sou, MD    Current Facility-Administered Medications  Medication Dose Route Frequency Provider Last Rate Last Dose  . ciprofloxacin (CIPRO) IVPB 400 mg  400 mg Intravenous Q12H Otho Bellows, RPH   400 mg at 05/25/15 5409  . magnesium sulfate IVPB 2 g 50 mL  2 g Intravenous Once Albertine Grates, MD   2 g at 05/25/15 1118  . metroNIDAZOLE (FLAGYL) IVPB 500 mg  500 mg Intravenous Q8H  Albertine Grates, MD   500 mg at 05/25/15 0537  . nicotine (NICODERM CQ - dosed in mg/24 hr) patch 7 mg  7 mg Transdermal Daily Albertine Grates, MD   7 mg at 05/24/15 2142  . ondansetron (ZOFRAN) injection 4 mg  4 mg Intravenous Q8H PRN Albertine Grates, MD      . oxyCODONE (Oxy IR/ROXICODONE) immediate release tablet 5 mg  5 mg Oral Q6H PRN Albertine Grates, MD   5 mg at 05/25/15 1117    Allergies as of 05/24/2015 - Review Complete 05/24/2015  Allergen Reaction Noted  . Aspirin Other (See Comments) 09/10/2014  . Ibuprofen Other (See Comments) 03/19/2015  . Tylenol [acetaminophen] Other (See Comments) 03/19/2015     Review of Systems:    Review of Systems -  Constitutional: Positive for weakness and chills Negative for fever.  Skin: Negative for lesions or rash ENT: Negative for change in vision or hearing Respiratory: Negative for shortness of breath or cough  Gastrointestinal: Positive for abdominal pain and weakness Negative for diarrhea.  Genitourinary: Negative for dysuria.  MSK: Positive for right toe numbness and occasional b/l edema Negative for weakness Psych: Neg for depression or anxiety    Physical Exam:  Vital signs in last 24 hours: Temp:  [99.2 F (37.3 C)-100.2 F (37.9 C)] 100.2 F (37.9 C) (05/19 0645) Pulse Rate:  [67-88] 88 (05/19 0645) Resp:  [16-18] 18 (05/19 0645) BP: (111-132)/(63-88) 116/63 mmHg (05/19 0645) SpO2:  [95 %-99 %] 95 % (05/19 0645) Weight:  [94.257 kg (207 lb 12.8 oz)] 94.257 kg (207 lb 12.8 oz) (05/19 0300) Last BM Date: 05/24/15 General:   Pleasant AA female in NAD Head:  Normocephalic and atraumatic. Eyes:   No icterus.   Conjunctiva pink. Ears:  Normal auditory acuity. Neck:  Supple Lungs:Respirations even and unlabored. Lungs clear to auscultation bilaterally.   No wheezes, crackles, or rhonchi.  Heart:  Regular rate and rhythm; no MRG Abdomen:  Soft, mild distension, generalized marked tenderness to light palpation, hernia palpated superior to umbilicus, Normal  bowel sounds. No appreciable masses or hepatomegaly.  Rectal:  Not performed.  Msk:  Symmetrical without gross deformities.  Extremities:  B/l lower edema. Neurologic:  Alert and  oriented x4;  grossly normal neurologically. Skin:  Intact without significant lesions or rashes. Psych:  Alert and cooperative. Normal affect.  LAB RESULTS:  Recent Labs  05/24/15 1158 05/25/15 0427  WBC 4.8 13.3*  HGB 12.8 11.5*  HCT 36.2 32.0*  PLT 122* 93*   BMET  Recent Labs  05/24/15 1158 05/25/15 0427  NA 138 132*  K 4.0 3.3*  CL 106 103  CO2 24 24  GLUCOSE 112* 116*  BUN 8 10  CREATININE 0.61 0.69  CALCIUM 8.3* 7.6*  LFT  Recent Labs  05/25/15 0427  PROT 6.9  ALBUMIN 2.2*  AST 108*  ALT 52  ALKPHOS 133*  BILITOT 5.3*   PT/INR  Recent Labs  05/25/15 0427  LABPROT 18.7*  INR 1.63*    STUDIES: Ct Abdomen Pelvis W Contrast  05/24/2015  CLINICAL DATA:  Midline upper abdominal pain for 24 hours, history of hiatal hernia EXAM: CT ABDOMEN AND PELVIS WITH CONTRAST TECHNIQUE: Multidetector CT imaging of the abdomen and pelvis was performed using the standard protocol following bolus administration of intravenous contrast. CONTRAST:  100mL ISOVUE-300 IOPAMIDOL (ISOVUE-300) INJECTION 61% COMPARISON:  05/07/2015 FINDINGS: Lower chest:  The lung bases are unremarkable. Hepatobiliary: Again noted cirrhotic liver. There is perihepatic and perisplenic ascites again noted. Moderate ascites bilateral paracolic gutters again noted. The patient is status postcholecystectomy. No focal hepatic mass. Pancreas: Enhanced pancreas is unremarkable. Spleen: Mild splenomegaly is stable. Adrenals/Urinary Tract: No adrenal gland mass. Kidneys are symmetrical in size and enhancement. No hydronephrosis or hydroureter. Delayed renal images shows bilateral renal symmetrical excretion. Bilateral visualized proximal ureter is unremarkable. The urinary bladder is unremarkable. Stomach/Bowel: There is no gastric  outlet obstruction. Mild distended distal small bowel loops with some fluid and air-fluid levels are noted lower abdomen see axial image 42. Findings suspicious for enteritis. There is thickening of the cecal wall and proximal right colon. Subtle mild enhancement of the right colon mucosa. Nonspecific colitis cannot be excluded. Clinical correlation is necessary. The appendix is not identified. There is no transition point in caliber of small bowel to suggest small bowel obstruction. Colonic diverticula are noted descending colon and proximal sigmoid colon. There is no evidence of acute diverticulitis or distal colitis. Vascular/Lymphatic: Mild atherosclerotic calcifications of abdominal aorta and iliac arteries. No mesenteric or retroperitoneal adenopathy. Portal vein measures 1.4 cm in diameter the probable due to portal hypertension. Reproductive: The uterus and ovaries are unremarkable. Other: There is moderate ascites bilateral paracolic gutters. Stable umbilical hernia containing fluid. Moderate pelvic ascites is noted. Musculoskeletal: No destructive bony lesions are noted. Sagittal images of the spine shows mild degenerative changes thoracolumbar spine. IMPRESSION: 1. Again noted cirrhosis of the liver and moderate pelvic and abdominal ascites probable due to portal hypertension. Mild splenomegaly. 2. Mild fluid distended with some air-fluid levels distal small bowel loops probable due to ileus or enteritis. No transition point in caliber of small bowel. There is some thickening of cecal wall and proximal right colon. Mild enhancement of right colon mucosa. Findings highly suspicious for nonspecific colitis. Enterocolitis cannot be excluded. The appendix is not identified. 3. Unremarkable uterus and ovaries. 4. Stable umbilical hernia containing fluid. 5. No hydronephrosis or hydroureter. Electronically Signed   By: Natasha MeadLiviu  Pop M.D.   On: 05/24/2015 14:48   Dg Chest Port 1 View  05/25/2015  CLINICAL DATA:   Sepsis.  History of hypertension and smoking. EXAM: PORTABLE CHEST 1 VIEW COMPARISON:  05/07/2015 FINDINGS: The cardiomediastinal silhouette is within normal limits. The lungs are well inflated and clear. There is no evidence of pleural effusion or pneumothorax. No acute osseous abnormality is identified. IMPRESSION: No active disease. Electronically Signed   By: Sebastian AcheAllen  Grady M.D.   On: 05/25/2015 10:15     PREVIOUS ENDOSCOPIES:            No previous   Impression / Plan:  Impression: 1. 50 yo A. A female presenting with increasing abdominal pain and weakness, h/o chronic cirrhosis, new finding of SBP, ascites, Hep C and polysubstance abuse at time  of admission (though patient denies). Likely Cirrhosis and ascites are result of Hep C infection which was likely secondary to IV drug use, no complicated by SBP 2. Umbilical hernia 3. Portal hypertension-resulting in above  Plan: 1. Hep C PCR Quant ordered 2. Cefotaxime 2g IV q8h is recommended though national shortage currently, so alternate recommendation is  Rocephin 2G IV q24h for 5 days, will start this 3. D/C Cipro and Flagyl at this time as SBP is thought to be source of infection 4. Will consider repeat paracentesis pending symptoms 5. Recommend <2G NA diet and continue Etoh abstinence 6. Also, recommend patient d/c polysubstance abuse as she would not be a candidate for Hep C tx in the future if she continues this 7. Continue supportive therapy 8. Will discuss with Dr. Myrtie Neither, please await any further recommendations  Thank you for your kind consultation, we will continue to follow.   LOS: 1 day   Unk Lightning  05/25/2015, 11:55 AM

## 2015-05-25 NOTE — Progress Notes (Signed)
[place] [address]. Patient being transferred to [id number]. All questions and concerns were addressed. Will continue to monitor patient until discharge.

## 2015-05-25 NOTE — Progress Notes (Signed)
PROGRESS NOTE  Linda Vaughn ZOX:[id number] DOB: 1965/03/22 DOA: 05/24/2015 PCP: Massie MaroonHollis,Lachina M, FNP  HPI/Recap of past 24 hours:  Report feeling a little better. 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 Will hold off diuretics for now until infection adequately controlled. GI consulted. abx adjusted per GI.  Sepsis: with fever 100.2, leukocytosis 13.3, sbp, and possible colitis, lactic acid procalcitonin level pending, sepsis order set utilized, avoid ivf for now due to ascites, bp stable so far. On cipro/flagyl initially, now on rocephin per gi  Colitis? On abx  Polysubstance abuse: uds + HTC and cocaine, also + for opioids but urine sample obtained after pain meds. education provided, patient got very offensive and upset when talking about drug use, she vehemently denies current drug use.  Cigarette smoking: on nicotine patchy  Code Status: full  Family Communication: patient   Disposition Plan: pending   Consultants:  GI  Procedures:  Diagnostic paracentesis done in the ED  Antibiotics:  Cirpo/flagyl on 5/18  Rocephin from 5/19   Objective: BP 116/63 mmHg  Pulse 88  Temp(Src) 100.2 F (37.9 C) (Oral)  Resp 18  Ht 5' (1.524 m)  Wt 94.257 kg (207 lb 12.8 oz)  BMI 40.58 kg/m2  SpO2 95%  Intake/Output Summary (Last 24 hours) at 05/25/15 0943 Last data filed at 05/25/15 0600  Gross per 24 hour  Intake   1240 ml  Output    900 ml  Net    340 ml   Filed Weights   05/25/15 0300  Weight: 94.257 kg (207 lb 12.8 oz)    Exam:   General:  aaox3  Cardiovascular: RRR  Respiratory: CTABL  Abdomen: distended, diffuse tender, ? rebound, positive bowel  sounds  Musculoskeletal: Bilateral Pitting lower extremity edema  Neuro: oriented x3  Data Reviewed: Basic Metabolic Panel:  Recent Labs Lab 05/24/15 1158 05/25/15 0427  NA 138 132*  K 4.0 3.3*  CL 106 103  CO2 24 24  GLUCOSE 112* 116*  BUN 8 10  CREATININE 0.61 0.69  CALCIUM 8.3* 7.6*  MG  --  1.5*   Liver Function Tests:  Recent Labs Lab 05/24/15 1158 05/25/15 0427  AST 141* 108*  ALT 62* 52  ALKPHOS 196* 133*  BILITOT 3.8* 5.3*  PROT 7.7 6.9  ALBUMIN 2.5* 2.2*    Recent Labs Lab 05/24/15 1158  LIPASE 20    Recent Labs Lab 05/25/15 0427  AMMONIA 58*   CBC:  Recent Labs Lab 05/24/15 1158 05/25/15 0427  WBC 4.8 13.3*  HGB 12.8 11.5*  HCT 36.2 32.0*  MCV 100.8* 100.6*  PLT 122* 93*   Cardiac Enzymes:   No results for input(s): CKTOTAL, CKMB, CKMBINDEX, TROPONINI in the last 168 hours. BNP (last 3 results) No results for input(s): BNP in the last 8760 hours.  ProBNP (last 3 results) No results for input(s): PROBNP in the last 8760 hours.  CBG: No results for input(s): GLUCAP in the last 168 hours.  Recent Results (from the past 240 hour(s))  Body fluid culture     Status: None (Preliminary result)   Collection Time: 05/24/15  7:37 PM  Result Value Ref Range Status   Specimen Description PERITONEAL CAVITY  Final   Special Requests Normal  Final  Gram Stain   Final    MODERATE WBC PRESENT,BOTH PMN AND MONONUCLEAR NO ORGANISMS SEEN    Culture PENDING  Incomplete   Report Status PENDING  Incomplete     Studies: Ct Abdomen Pelvis W Contrast  05/24/2015  CLINICAL DATA:  Midline upper abdominal pain for 24 hours, history of hiatal hernia EXAM: CT ABDOMEN AND PELVIS WITH CONTRAST TECHNIQUE: Multidetector CT imaging of the abdomen and pelvis was performed using the standard protocol following bolus administration of intravenous contrast. CONTRAST:  ISOVUE-300 IOPAMIDOL (ISOVUE-300) INJECTION 61% COMPARISON:  05/07/2015 FINDINGS: Lower  chest:  The lung bases are unremarkable. Hepatobiliary: Again noted cirrhotic liver. There is perihepatic and perisplenic ascites again noted. Moderate ascites bilateral paracolic gutters again noted. The patient is status postcholecystectomy. No focal hepatic mass. Pancreas: Enhanced pancreas is unremarkable. Spleen: Mild splenomegaly is stable. Adrenals/Urinary Tract: No adrenal gland mass. Kidneys are symmetrical in size and enhancement. No hydronephrosis or hydroureter. Delayed renal images shows bilateral renal symmetrical excretion. Bilateral visualized proximal ureter is unremarkable. The urinary bladder is unremarkable. Stomach/Bowel: There is no gastric outlet obstruction. Mild distended distal small bowel loops with some fluid and air-fluid levels are noted lower abdomen see axial image 42. Findings suspicious for enteritis. There is thickening of the cecal wall and proximal right colon. Subtle mild enhancement of the right colon mucosa. Nonspecific colitis cannot be excluded. Clinical correlation is necessary. The appendix is not identified. There is no transition point in caliber of small bowel to suggest small bowel obstruction. Colonic diverticula are noted descending colon and proximal sigmoid colon. There is no evidence of acute diverticulitis or distal colitis. Vascular/Lymphatic: Mild atherosclerotic calcifications of abdominal aorta and iliac arteries. No mesenteric or retroperitoneal adenopathy. Portal vein measures 1.4 cm in diameter the probable due to portal hypertension. Reproductive: The uterus and ovaries are unremarkable. Other: There is moderate ascites bilateral paracolic gutters. Stable umbilical hernia containing fluid. Moderate pelvic ascites is noted. Musculoskeletal: No destructive bony lesions are noted. Sagittal images of the spine shows mild degenerative changes thoracolumbar spine. IMPRESSION: 1. Again noted cirrhosis of the liver and moderate pelvic and abdominal ascites  probable due to portal hypertension. Mild splenomegaly. 2. Mild fluid distended with some air-fluid levels distal small bowel loops probable due to ileus or enteritis. No transition point in caliber of small bowel. There is some thickening of cecal wall and proximal right colon. Mild enhancement of right colon mucosa. Findings highly suspicious for nonspecific colitis. Enterocolitis cannot be excluded. The appendix is not identified. 3. Unremarkable uterus and ovaries. 4. Stable umbilical hernia containing fluid. 5. No hydronephrosis or hydroureter. Electronically Signed   By: Natasha Mead M.D.   On: 05/24/2015 14:48    Scheduled Meds: . ciprofloxacin  400 mg Intravenous Q12H  . magnesium sulfate 1 - 4 g bolus IVPB  2 g Intravenous Once  . metronidazole  500 mg Intravenous Q8H  . nicotine  7 mg Transdermal Daily  . potassium chloride  40 mEq Oral Once    Continuous Infusions:    Time spent:  Herberta Pickron MD, PhD  [facility] Pager [phone]. If 7PM-7AM, please contact night-coverage at [email or web address], password [facility] 05/25/2015, 9:43 AM  LOS: 1 day

## 2015-05-26 DIAGNOSIS — A419 Sepsis, unspecified organism: Secondary | ICD-10-CM | POA: Insufficient documentation

## 2015-05-26 DIAGNOSIS — K7031 Alcoholic cirrhosis of liver with ascites: Secondary | ICD-10-CM

## 2015-05-26 LAB — COMPREHENSIVE METABOLIC PANEL
ALT: 41 U/L (ref 14–54)
ANION GAP: 4 — AB (ref 5–15)
AST: 79 U/L — AB (ref 15–41)
Albumin: 1.9 g/dL — ABNORMAL LOW (ref 3.5–5.0)
Alkaline Phosphatase: 140 U/L — ABNORMAL HIGH (ref 38–126)
BUN: 8 mg/dL (ref 6–20)
CHLORIDE: 104 mmol/L (ref 101–111)
CO2: 27 mmol/L (ref 22–32)
Calcium: 7.9 mg/dL — ABNORMAL LOW (ref 8.9–10.3)
Creatinine, Ser: 0.74 mg/dL (ref 0.44–1.00)
Glucose, Bld: 106 mg/dL — ABNORMAL HIGH (ref 65–99)
POTASSIUM: 4 mmol/L (ref 3.5–5.1)
Sodium: 135 mmol/L (ref 135–145)
Total Bilirubin: 2.9 mg/dL — ABNORMAL HIGH (ref 0.3–1.2)
Total Protein: 5.9 g/dL — ABNORMAL LOW (ref 6.5–8.1)

## 2015-05-26 LAB — HIV ANTIBODY (ROUTINE TESTING W REFLEX): HIV Screen 4th Generation wRfx: NONREACTIVE

## 2015-05-26 LAB — HCV RNA QUANT
HCV QUANT LOG: 6.017 {Log_IU}/mL (ref >1.70)
HCV QUANT: 1040000 [IU]/mL (ref >50)

## 2015-05-26 LAB — AMMONIA: AMMONIA: 55 umol/L — AB (ref 9–35)

## 2015-05-26 LAB — CBC
HCT: 30.3 % — ABNORMAL LOW (ref 36.0–46.0)
Hemoglobin: 10.7 g/dL — ABNORMAL LOW (ref 12.0–15.0)
MCH: 36.3 pg — AB (ref 26.0–34.0)
MCHC: 35.3 g/dL (ref 30.0–36.0)
MCV: 102.7 fL — AB (ref 78.0–100.0)
PLATELETS: 78 10*3/uL — AB (ref 150–400)
RBC: 2.95 MIL/uL — ABNORMAL LOW (ref 3.87–5.11)
RDW: 14.9 % (ref 11.5–15.5)
WBC: 8.8 10*3/uL (ref 4.0–10.5)

## 2015-05-26 LAB — MAGNESIUM: MAGNESIUM: 1.7 mg/dL (ref 1.7–2.4)

## 2015-05-26 MED ORDER — HYDROMORPHONE HCL 1 MG/ML IJ SOLN
1.0000 mg | INTRAMUSCULAR | Status: AC | PRN
  Administered 2015-05-26 (×2): 1 mg via INTRAVENOUS
  Filled 2015-05-26 (×2): qty 1

## 2015-05-26 MED ORDER — HYDROMORPHONE HCL 1 MG/ML IJ SOLN
1.0000 mg | INTRAMUSCULAR | Status: AC | PRN
  Administered 2015-05-26 – 2015-05-27 (×4): 1 mg via INTRAVENOUS
  Filled 2015-05-26 (×4): qty 1

## 2015-05-26 NOTE — Progress Notes (Signed)
PROGRESS NOTE  Otho DarnerJarryl D Sagraves ZOX:[id number] DOB: Jul 03, 1965 DOA: 05/24/2015 PCP: Massie MaroonHollis,Lachina M, FNP  HPI/Recap of past 24 hours:  Report feeling  better. 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 Will hold off diuretics for now until infection adequately controlled. GI consulted. abx adjusted per GI.  Sepsis: with fever 100.2, leukocytosis 13.3, sbp, and possible colitis, lactic acid procalcitonin level pending, sepsis order set utilized, avoid ivf for now due to ascites, bp stable so far. On cipro/flagyl initially, now on rocephin per gi  Colitis? On abx  Polysubstance abuse: uds + HTC and cocaine, also + for opioids but urine sample obtained after pain meds. education provided, patient got very offensive and upset when talking about drug use, she vehemently denies current drug use.  Cigarette smoking: on nicotine patchy  Code Status: full  Family Communication: patient   Disposition Plan: d/c in 1-2 days   Consultants:  GI  Procedures:  Diagnostic paracentesis done in the ED  Antibiotics:  Cirpo/flagyl on 5/18  Rocephin from 5/19   Objective: BP 112/74 mmHg  Pulse 78  Temp(Src) 98.8 F (37.1 C) (Oral)  Resp 17  Ht 5' (1.524 m)  Wt 94.257 kg (207 lb 12.8 oz)  BMI 40.58 kg/m2  SpO2 96%  Intake/Output Summary (Last 24 hours) at 05/26/15 1419 Last data filed at 05/26/15 0941  Gross per 24 hour  Intake    360 ml  Output      0 ml  Net    360 ml   Filed Weights   05/25/15 0300  Weight: 94.257 kg (207 lb 12.8  oz)    Exam:   General:  aaox3  Cardiovascular: RRR  Respiratory: CTABL  Abdomen: distended, diffuse tender has largely resolved, positive bowel sounds  Musculoskeletal: Bilateral Pitting lower extremity edema  Neuro: oriented x3  Data Reviewed: Basic Metabolic Panel:  Recent Labs Lab 05/24/15 1158 05/25/15 0427 05/26/15 0539  NA 138 132* 135  K 4.0 3.3* 4.0  CL 106 103 104  CO2 24 24 27   GLUCOSE 112* 116* 106*  BUN 8 10 8   CREATININE 0.61 0.69 0.74  CALCIUM 8.3* 7.6* 7.9*  MG  --  1.5* 1.7   Liver Function Tests:  Recent Labs Lab 05/24/15 1158 05/25/15 0427 05/26/15 0539  AST 141* 108* 79*  ALT 62* 52 41  ALKPHOS 196* 133* 140*  BILITOT 3.8* 5.3* 2.9*  PROT 7.7 6.9 5.9*  ALBUMIN 2.5* 2.2* 1.9*    Recent Labs Lab 05/24/15 1158  LIPASE 20    Recent Labs Lab 05/25/15 0427 05/26/15 0539  AMMONIA 58* 55*   CBC:  Recent Labs Lab 05/24/15 1158 05/25/15 0427 05/26/15 0539  WBC 4.8 13.3* 8.8  HGB 12.8 11.5* 10.7*  HCT 36.2 32.0* 30.3*  MCV 100.8* 100.6* 102.7*  PLT 122* 93* 78*   Cardiac Enzymes:   No results for input(s): CKTOTAL, CKMB, CKMBINDEX, TROPONINI in the last 168 hours. BNP (last 3 results) No results for input(s): BNP in the last 8760 hours.  ProBNP (last 3 results) No results for input(s): PROBNP in the last 8760 hours.  CBG:  No results for input(s): GLUCAP in the last 168 hours.  Recent Results (from the past 240 hour(s))  Body fluid culture     Status: None (Preliminary result)   Collection Time: 05/24/15  7:37 PM  Result Value Ref Range Status   Specimen Description PERITONEAL CAVITY  Final   Special Requests Normal  Final   Gram Stain   Final    MODERATE WBC PRESENT,BOTH PMN AND MONONUCLEAR NO ORGANISMS SEEN    Culture   Final    NO GROWTH 2 DAYS Performed at [facility]    Report Status PENDING  Incomplete  Culture, blood (routine x 2)     Status: None (Preliminary result)   Collection Time:  05/25/15 10:32 AM  Result Value Ref Range Status   Specimen Description BLOOD RIGHT ARM  Final   Special Requests BOTTLES DRAWN AEROBIC AND ANAEROBIC 7CC  Final   Culture   Final    NO GROWTH < 24 HOURS Performed at [facility]    Report Status PENDING  Incomplete  Culture, blood (routine x 2)     Status: None (Preliminary result)   Collection Time: 05/25/15 10:32 AM  Result Value Ref Range Status   Specimen Description BLOOD RIGHT HAND  Final   Special Requests IN PEDIATRIC BOTTLE 3CC  Final   Culture   Final    NO GROWTH < 24 HOURS Performed at [facility]    Report Status PENDING  Incomplete  MRSA PCR Screening     Status: None   Collection Time: 05/25/15 11:23 AM  Result Value Ref Range Status   MRSA by PCR NEGATIVE NEGATIVE Final    Comment:        The GeneXpert MRSA Assay (FDA approved for NASAL specimens only), is one component of a comprehensive MRSA colonization surveillance program. It is not intended to diagnose MRSA infection nor to guide or monitor treatment for MRSA infections.      Studies: No results found.  Scheduled Meds: . cefTRIAXone (ROCEPHIN)  IV  2 g Intravenous Q24H  . nicotine  7 mg Transdermal Daily    Continuous Infusions:    Time spent:  Cheyrl Buley MD, PhD  [facility] Pager [phone]. If 7PM-7AM, please contact night-coverage at [email or web address], password [facility] 05/26/2015, 2:19 PM  LOS: 2 days

## 2015-05-26 NOTE — Progress Notes (Signed)
Patient ID: Linda Vaughn, female   DOB: 07/13/1965, 50 y.o.   MRN: [id number]    Progress Note   Subjective    Feels ok - Abdomen sore all over, eating without difficulty    Objective   Vital signs in last 24 hours: Temp:  [98.8 F (37.1 C)-99.5 F (37.5 C)] 98.8 F (37.1 C) (05/20 0539) Pulse Rate:  [78-82] 78 (05/20 0539) Resp:  [17-18] 17 (05/20 0539) BP: (112-128)/(68-74) 112/74 mmHg (05/20 0539) SpO2:  [96 %-99 %] 96 % (05/20 0539) Last BM Date: 05/24/15 General:    AA  female in NAD Heart:  Regular rate and rhythm; no murmurs Lungs: Respirations even and unlabored, lungs CTA bilaterally Abdomen:  Soft, protuberant distended. With fluid, nontense  Normal bowel sounds.umbilical hernia tender , diffusely tender Extremities:  Without edema. Neurologic:  Alert and oriented,  grossly normal neurologically. Psych:  Cooperative. Normal mood and affect.  Intake/Output from previous day: 05/19 0701 - 05/20 0700 In: 480 [P.O.:480] Out: -  Intake/Output this shift:    Lab Results:  Recent Labs  05/24/15 1158 05/25/15 0427 05/26/15 0539  WBC 4.8 13.3* 8.8  HGB 12.8 11.5* 10.7*  HCT 36.2 32.0* 30.3*  PLT 122* 93* 78*   BMET  Recent Labs  05/24/15 1158 05/25/15 0427 05/26/15 0539  NA 138 132* 135  K 4.0 3.3* 4.0  CL 106 103 104  CO2 24 24 27   GLUCOSE 112* 116* 106*  BUN 8 10 8   CREATININE 0.61 0.69 0.74  CALCIUM 8.3* 7.6* 7.9*   LFT  Recent Labs  05/26/15 0539  PROT 5.9*  ALBUMIN 1.9*  AST 79*  ALT 41  ALKPHOS 140*  BILITOT 2.9*   PT/INR  Recent Labs  05/25/15 0427  LABPROT 18.7*  INR 1.63*    Studies/Results: Ct Abdomen Pelvis W Contrast  05/24/2015  CLINICAL DATA:  Midline upper abdominal pain for 24 hours, history of hiatal hernia EXAM: CT ABDOMEN AND PELVIS WITH CONTRAST TECHNIQUE: Multidetector CT imaging of the abdomen and pelvis was performed using the standard protocol following bolus administration of intravenous contrast.  CONTRAST:  100mL ISOVUE-300 IOPAMIDOL (ISOVUE-300) INJECTION 61% COMPARISON:  05/07/2015 FINDINGS: Lower chest:  The lung bases are unremarkable. Hepatobiliary: Again noted cirrhotic liver. There is perihepatic and perisplenic ascites again noted. Moderate ascites bilateral paracolic gutters again noted. The patient is status postcholecystectomy. No focal hepatic mass. Pancreas: Enhanced pancreas is unremarkable. Spleen: Mild splenomegaly is stable. Adrenals/Urinary Tract: No adrenal gland mass. Kidneys are symmetrical in size and enhancement. No hydronephrosis or hydroureter. Delayed renal images shows bilateral renal symmetrical excretion. Bilateral visualized proximal ureter is unremarkable. The urinary bladder is unremarkable. Stomach/Bowel: There is no gastric outlet obstruction. Mild distended distal small bowel loops with some fluid and air-fluid levels are noted lower abdomen see axial image 42. Findings suspicious for enteritis. There is thickening of the cecal wall and proximal right colon. Subtle mild enhancement of the right colon mucosa. Nonspecific colitis cannot be excluded. Clinical correlation is necessary. The appendix is not identified. There is no transition point in caliber of small bowel to suggest small bowel obstruction. Colonic diverticula are noted descending colon and proximal sigmoid colon. There is no evidence of acute diverticulitis or distal colitis. Vascular/Lymphatic: Mild atherosclerotic calcifications of abdominal aorta and iliac arteries. No mesenteric or retroperitoneal adenopathy. Portal vein measures 1.4 cm in diameter the probable due to portal hypertension. Reproductive: The uterus and ovaries are unremarkable. Other: There is moderate ascites bilateral paracolic gutters. Stable  umbilical hernia containing fluid. Moderate pelvic ascites is noted. Musculoskeletal: No destructive bony lesions are noted. Sagittal images of the spine shows mild degenerative changes thoracolumbar  spine. IMPRESSION: 1. Again noted cirrhosis of the liver and moderate pelvic and abdominal ascites probable due to portal hypertension. Mild splenomegaly. 2. Mild fluid distended with some air-fluid levels distal small bowel loops probable due to ileus or enteritis. No transition point in caliber of small bowel. There is some thickening of cecal wall and proximal right colon. Mild enhancement of right colon mucosa. Findings highly suspicious for nonspecific colitis. Enterocolitis cannot be excluded. The appendix is not identified. 3. Unremarkable uterus and ovaries. 4. Stable umbilical hernia containing fluid. 5. No hydronephrosis or hydroureter. Electronically Signed   By: Natasha Mead M.D.   On: 05/24/2015 14:48   Dg Chest Port 1 View  05/25/2015  CLINICAL DATA:  Sepsis.  History of hypertension and smoking. EXAM: PORTABLE CHEST 1 VIEW COMPARISON:  05/07/2015 FINDINGS: The cardiomediastinal silhouette is within normal limits. The lungs are well inflated and clear. There is no evidence of pleural effusion or pneumothorax. No acute osseous abnormality is identified. IMPRESSION: No active disease. Electronically Signed   By: Sebastian Ache M.D.   On: 05/25/2015 10:15       Assessment / Plan:    #1 50 yo female with HepC cirrhosis , decompensated with ascites, thrombocytopenia, coagulapathy, and now SBP    MELD /NA = 19 #2 polysubstance abuse #3 HTN    Plan; On Rocephin starting 5/19- needs % days IV then  Repeat at least diagnostic tap  Continue supportive care  Active Problems:   Ascites   SBP (spontaneous bacterial peritonitis) (HCC)   Hepatic cirrhosis (HCC)   Chronic hepatitis C without hepatic coma (HCC)   Polysubstance abuse     LOS: 2 days   Steve Gregg  05/26/2015, 9:39 AM

## 2015-05-27 ENCOUNTER — Inpatient Hospital Stay (HOSPITAL_COMMUNITY): Payer: Medicaid Other

## 2015-05-27 LAB — COMPREHENSIVE METABOLIC PANEL
ALBUMIN: 1.9 g/dL — AB (ref 3.5–5.0)
ALT: 41 U/L (ref 14–54)
ANION GAP: 4 — AB (ref 5–15)
AST: 79 U/L — AB (ref 15–41)
Alkaline Phosphatase: 148 U/L — ABNORMAL HIGH (ref 38–126)
BUN: 7 mg/dL (ref 6–20)
CHLORIDE: 104 mmol/L (ref 101–111)
CO2: 26 mmol/L (ref 22–32)
Calcium: 7.9 mg/dL — ABNORMAL LOW (ref 8.9–10.3)
Creatinine, Ser: 0.67 mg/dL (ref 0.44–1.00)
GFR calc Af Amer: >60 mL/min (ref >60)
GFR calc non Af Amer: >60 mL/min (ref >60)
GLUCOSE: 88 mg/dL (ref 65–99)
POTASSIUM: 4 mmol/L (ref 3.5–5.1)
Sodium: 134 mmol/L — ABNORMAL LOW (ref 135–145)
Total Bilirubin: 2.6 mg/dL — ABNORMAL HIGH (ref 0.3–1.2)
Total Protein: 6.1 g/dL — ABNORMAL LOW (ref 6.5–8.1)

## 2015-05-27 LAB — CBC
HCT: 31.9 % — ABNORMAL LOW (ref 36.0–46.0)
Hemoglobin: 11.2 g/dL — ABNORMAL LOW (ref 12.0–15.0)
MCH: 36 pg — ABNORMAL HIGH (ref 26.0–34.0)
MCHC: 35.1 g/dL (ref 30.0–36.0)
MCV: 102.6 fL — ABNORMAL HIGH (ref 78.0–100.0)
PLATELETS: 87 10*3/uL — AB (ref 150–400)
RBC: 3.11 MIL/uL — ABNORMAL LOW (ref 3.87–5.11)
RDW: 14.9 % (ref 11.5–15.5)
WBC: 5.1 10*3/uL (ref 4.0–10.5)

## 2015-05-27 LAB — AFP TUMOR MARKER: AFP TUMOR MARKER: 26.2 ng/mL — AB (ref 0.0–8.3)

## 2015-05-27 LAB — PROTIME-INR
INR: 1.92 — ABNORMAL HIGH (ref 0.00–1.49)
PROTHROMBIN TIME: 21.2 s — AB (ref 11.6–15.2)

## 2015-05-27 MED ORDER — METRONIDAZOLE IN NACL 5-0.79 MG/ML-% IV SOLN
500.0000 mg | Freq: Three times a day (TID) | INTRAVENOUS | Status: DC
  Administered 2015-05-27 – 2015-05-30 (×9): 500 mg via INTRAVENOUS
  Filled 2015-05-27 (×11): qty 100

## 2015-05-27 MED ORDER — MORPHINE SULFATE (PF) 2 MG/ML IV SOLN
2.0000 mg | Freq: Once | INTRAVENOUS | Status: AC
  Administered 2015-05-27: 2 mg via INTRAVENOUS
  Filled 2015-05-27: qty 1

## 2015-05-27 NOTE — Progress Notes (Signed)
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  Will hold off diuretics for now until infection adequately controlled. abx adjusted per GI. Abdominal pain returned on 5/21 with abdominal distension and diffuse tender, ? Peritoneal sign, will proceed with therapeutic paracentesis with repeat fluids analysis /culture/cytology Will follow GI recommendations.  Sepsis: with fever 100.2, leukocytosis 13.3, sbp, and possible colitis, lactic acid procalcitonin level pending, sepsis order set utilized, avoid ivf for now due to ascites, bp stable so far. On cipro/flagyl initially, now on rocephin per gi  Colitis? On abx  Hepatitis C, new diagnose. hcv RNA [id number]  Polysubstance abuse: uds + HTC and cocaine, also + for opioids but urine sample obtained after pain meds. education provided, patient got very offensive and upset when talking about drug use, she vehemently denies current drug use.  Cigarette smoking: on nicotine patch  Code Status: full  Family Communication: patient   Disposition Plan: pending   Consultants:  GI  Procedures:  Diagnostic paracentesis done in the ED  Therapeutic paracentesis by IR on 5/21  Antibiotics:  Cirpo/flagyl on 5/18  Rocephin from 5/19   Objective: BP 106/50  mmHg  Pulse 71  Temp(Src) 98.8 F (37.1 C) (Oral)  Resp 18  Ht 5' (1.524 m)  Wt 94.257 kg (207 lb 12.8 oz)  BMI 40.58 kg/m2  SpO2 98%  Intake/Output Summary (Last 24 hours) at 05/27/15 0852 Last data filed at 05/27/15 0700  Gross per 24 hour  Intake    600 ml  Output      0 ml  Net    600 ml   Filed Weights   05/25/15 0300  Weight: 94.257 kg (207 lb 12.8 oz)    Exam:   General:  Aaox3, in pain, not comfortable  Cardiovascular: RRR  Respiratory: CTABL  Abdomen: distended, diffuse tender, ? Peritoneal sign, positive bowel sounds  Musculoskeletal: Bilateral Pitting lower extremity edema  Neuro: oriented x3  Data Reviewed: Basic Metabolic Panel:  Recent Labs Lab 05/24/15 1158 05/25/15 0427 05/26/15 0539 05/27/15 0600  NA 138 132* 135 134*  K 4.0 3.3* 4.0 4.0  CL 106 103 104 104  CO2 24 24 27 26   GLUCOSE 112* 116* 106* 88  BUN 8 10 8 7   CREATININE 0.61 0.69 0.74 0.67  CALCIUM 8.3* 7.6* 7.9* 7.9*  MG  --  1.5* 1.7  --    Liver Function Tests:  Recent Labs Lab 05/24/15 1158 05/25/15 0427 05/26/15 0539 05/27/15 0600  AST 141* 108* 79* 79*  ALT 62* 52 41 41  ALKPHOS 196* 133* 140* 148*  BILITOT 3.8* 5.3* 2.9* 2.6*  PROT 7.7 6.9 5.9* 6.1*  ALBUMIN 2.5* 2.2* 1.9* 1.9*    Recent Labs Lab 05/24/15 1158  LIPASE 20    Recent Labs Lab 05/25/15 0427 05/26/15 0539  AMMONIA 58* 55*   CBC:  Recent  Labs Lab 05/24/15 1158 05/25/15 0427 05/26/15 0539 05/27/15 0600  WBC 4.8 13.3* 8.8 5.1  HGB 12.8 11.5* 10.7* 11.2*  HCT 36.2 32.0* 30.3* 31.9*  MCV 100.8* 100.6* 102.7* 102.6*  PLT 122* 93* 78* 87*   Cardiac Enzymes:   No results for input(s): CKTOTAL, CKMB, CKMBINDEX, TROPONINI in the last 168 hours. BNP (last 3 results) No results for input(s): BNP in the last 8760 hours.  ProBNP (last 3 results) No results for input(s): PROBNP in the last 8760 hours.  CBG: No results for input(s): GLUCAP in the last 168 hours.  Recent Results (from  the past 240 hour(s))  Body fluid culture     Status: None (Preliminary result)   Collection Time: 05/24/15  7:37 PM  Result Value Ref Range Status   Specimen Description PERITONEAL CAVITY  Final   Special Requests Normal  Final   Gram Stain   Final    MODERATE WBC PRESENT,BOTH PMN AND MONONUCLEAR NO ORGANISMS SEEN    Culture   Final    NO GROWTH 2 DAYS Performed at [facility]    Report Status PENDING  Incomplete  Culture, blood (routine x 2)     Status: None (Preliminary result)   Collection Time: 05/25/15 10:32 AM  Result Value Ref Range Status   Specimen Description BLOOD RIGHT ARM  Final   Special Requests BOTTLES DRAWN AEROBIC AND ANAEROBIC 7CC  Final   Culture   Final    NO GROWTH < 24 HOURS Performed at [facility]    Report Status PENDING  Incomplete  Culture, blood (routine x 2)     Status: None (Preliminary result)   Collection Time: 05/25/15 10:32 AM  Result Value Ref Range Status   Specimen Description BLOOD RIGHT HAND  Final   Special Requests IN PEDIATRIC BOTTLE 3CC  Final   Culture   Final    NO GROWTH < 24 HOURS Performed at [facility]    Report Status PENDING  Incomplete  MRSA PCR Screening     Status: None   Collection Time: 05/25/15 11:23 AM  Result Value Ref Range Status   MRSA by PCR NEGATIVE NEGATIVE Final    Comment:        The GeneXpert MRSA Assay (FDA approved for NASAL specimens only), is one component of a comprehensive MRSA colonization surveillance program. It is not intended to diagnose MRSA infection nor to guide or monitor treatment for MRSA infections.      Studies: No results found.  Scheduled Meds: . cefTRIAXone (ROCEPHIN)  IV  2 g Intravenous Q24H  . nicotine  7 mg Transdermal Daily    Continuous Infusions:    Time spent:  Crystol Walpole MD, PhD  [facility] Pager [phone]. If 7PM-7AM, please contact night-coverage at [email or web address], password [facility] 05/27/2015, 8:52 AM  LOS: 3 days

## 2015-05-27 NOTE — Progress Notes (Signed)
[facility] Progress Note  Chief Complaint: Abdominal pain and SBP  Subjective History:  She complained of worsened pain today compared to yesterday, underwent an attempt at a repeat paracentesis, but there was no adequate fluid collection for access. She is requiring IV narcotics for pain control.  ROS: Cardiovascular:  no chest pain Respiratory: no dyspnea  Objective:  Med list reviewed  Vital signs in last 24 hrs: Filed Vitals:   05/26/15 2038 05/27/15 0455  BP: 119/62 106/50  Pulse: 87 71  Temp: 99.8 F (37.7 C) 98.8 F (37.1 C)  Resp: 18 18    Physical Exam She was sleeping comfortably when I entered the room. She awakens easily and complains of abdominal pain. She is nontoxic appearing and in no acute distress  HEENT: sclera mildly icteric, oral mucosa moist without lesions  Neck: supple, no thyromegaly, JVD or lymphadenopathy  Cardiac: RRR without murmurs, S1S2 heard, no peripheral edema  Pulm: clear to auscultation bilaterally, normal RR and effort noted  Abdomen: soft, mild diffuse tenderness, with active bowel sounds. No guarding or palpable hepatosplenomegaly, limited by abdominal girth. She has  reducible umbilical hernia as before.  Skin; warm and dry, no jaundice or rash  Recent Labs:   Recent Labs Lab 05/25/15 0427 05/26/15 0539 05/27/15 0600  WBC 13.3* 8.8 5.1  HGB 11.5* 10.7* 11.2*  HCT 32.0* 30.3* 31.9*  PLT 93* 78* 87*    Recent Labs Lab 05/27/15 0600  NA 134*  K 4.0  CL 104  CO2 26  BUN 7  ALBUMIN 1.9*  ALKPHOS 148*  ALT 41  AST 79*  GLUCOSE 88  Total bilirubin 2.6, down from 2.9  Recent Labs Lab 05/27/15 0600  INR 1.92*  Blood and ascites cultures so far negative.  Radiologic studies: The report of the attempted paracentesis was reviewed. I again reviewed the CT scan from admission. There is thickening of the right colon wall, but I believe it is because of adjacent fluid. 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  I do not think she has secondary bacterial peritonitis from something like diverticulitis or intestinal microperforation.  She does not have a surgical abdomen. I think she may have some underlying chronic pain issues exacerbating this as well. Her serum WBC is decreasing. Plan: I will broaden her antibiotic coverage to be on the safe side by adding metronidazole. Do not start diuretics on her yet in the setting of SBP. Must wait, as outlined in the initial consult note.  We will follow her.   Charlie PitterHenry L Danis III Pager [phone] Mon-Fri 8a-5p [phone] after 5p, weekends, holidays

## 2015-05-27 NOTE — Progress Notes (Signed)
Patient ID: [name], female   DOB: 04/30/1965, 50 y.o.   MRN: [id number]  US Limited abdomen did reveal very small pocket of fluid near liver Attempted RLQ paracentesis using US guidance since labs were requested  Was able to access small pocket of fluid near liver with US guidance 1 cc yellow fluid aspirated into access syringe When connected to 60 cc syringe could not aspirate any fluid Lost access Was unable to regain access into small pocket even with US guidance  Consider reattempt if,when, fluid accumulates to easily accessible pocket

## 2015-05-28 ENCOUNTER — Inpatient Hospital Stay (HOSPITAL_COMMUNITY): Payer: Medicaid Other

## 2015-05-28 LAB — COMPREHENSIVE METABOLIC PANEL
ALT: 40 U/L (ref 14–54)
ANION GAP: 6 (ref 5–15)
AST: 87 U/L — AB (ref 15–41)
Albumin: 1.9 g/dL — ABNORMAL LOW (ref 3.5–5.0)
Alkaline Phosphatase: 148 U/L — ABNORMAL HIGH (ref 38–126)
BILIRUBIN TOTAL: 2.2 mg/dL — AB (ref 0.3–1.2)
BUN: 8 mg/dL (ref 6–20)
CALCIUM: 8.2 mg/dL — AB (ref 8.9–10.3)
CO2: 29 mmol/L (ref 22–32)
Chloride: 101 mmol/L (ref 101–111)
Creatinine, Ser: 0.87 mg/dL (ref 0.44–1.00)
GFR calc Af Amer: >60 mL/min (ref >60)
Glucose, Bld: 94 mg/dL (ref 65–99)
POTASSIUM: 4.3 mmol/L (ref 3.5–5.1)
Sodium: 136 mmol/L (ref 135–145)
TOTAL PROTEIN: 6.2 g/dL — AB (ref 6.5–8.1)

## 2015-05-28 LAB — BODY FLUID CULTURE
Culture: NO GROWTH
Special Requests: NORMAL

## 2015-05-28 LAB — CBC
HEMATOCRIT: 31.2 % — AB (ref 36.0–46.0)
Hemoglobin: 10.9 g/dL — ABNORMAL LOW (ref 12.0–15.0)
MCH: 35.9 pg — ABNORMAL HIGH (ref 26.0–34.0)
MCHC: 34.9 g/dL (ref 30.0–36.0)
MCV: 102.6 fL — AB (ref 78.0–100.0)
Platelets: 91 10*3/uL — ABNORMAL LOW (ref 150–400)
RBC: 3.04 MIL/uL — AB (ref 3.87–5.11)
RDW: 14.9 % (ref 11.5–15.5)
WBC: 3.7 10*3/uL — AB (ref 4.0–10.5)

## 2015-05-28 LAB — PROTIME-INR
INR: 1.99 — ABNORMAL HIGH (ref 0.00–1.49)
Prothrombin Time: 21.8 seconds — ABNORMAL HIGH (ref 11.6–15.2)

## 2015-05-28 LAB — HEPATITIS C VRS RNA DETECT BY PCR-QUAL: Hepatitis C Vrs RNA by PCR-Qual: POSITIVE — AB

## 2015-05-28 LAB — MAGNESIUM: MAGNESIUM: 1.6 mg/dL — AB (ref 1.7–2.4)

## 2015-05-28 MED ORDER — POLYETHYLENE GLYCOL 3350 17 G PO PACK
17.0000 g | PACK | Freq: Every day | ORAL | Status: DC
  Filled 2015-05-28 (×2): qty 1

## 2015-05-28 MED ORDER — MAGNESIUM SULFATE 2 GM/50ML IV SOLN
2.0000 g | Freq: Once | INTRAVENOUS | Status: AC
  Administered 2015-05-28: 2 g via INTRAVENOUS
  Filled 2015-05-28: qty 50

## 2015-05-28 MED ORDER — SENNOSIDES-DOCUSATE SODIUM 8.6-50 MG PO TABS
1.0000 | ORAL_TABLET | Freq: Two times a day (BID) | ORAL | Status: DC
  Administered 2015-05-28: 1 via ORAL
  Filled 2015-05-28 (×6): qty 1

## 2015-05-28 NOTE — Progress Notes (Signed)
PROGRESS NOTE  Linda Vaughn ZOX:[id number] DOB: [birth year] DOA: 05/24/2015 PCP: [name], FNP  HPI/Recap of past 24 hours:  persistent abdominal pain and distension, no fever, no n/v, normal bm by report    Assessment/Plan: Active Problems:   Ascites   SBP (spontaneous bacterial peritonitis) (HCC)   Hepatic cirrhosis (HCC)   Chronic hepatitis C without hepatic coma (HCC)   Polysubstance abuse   Sepsis (HCC)  Cirrhosis from hepc+/-alcohol/ascites/sbp/lower extremity edema: afp elevated at 26  diagnostic paracentesis done in the ED showed about 5000wbc with (90% neutrophils),  Ct ab also with signs of colitis, she is started on cipro/flagyl empirically, culture obtained on admission no growth.  Will hold off diuretics for now until infection adequately controlled. abx adjusted per GI. Abdominal pain returned on 5/21 with abdominal distension and diffuse tender, ? Peritoneal sign, US guided paracentesis was not successful, will attempt again on 5/22 due to persistent ab pain and significant distension, with repeat fluids analysis /culture/cytology Will follow GI recommendations.  Sepsis: with fever 100.2, leukocytosis 13.3, sbp, and possible colitis, lactic acid procalcitonin level pending, sepsis order set utilized, avoid ivf for now due to ascites, bp stable so far. On cipro/flagyl initially, now on rocephin and flagyl per gi  Colitis? On abx  Hepatitis C, new diagnose. hcv RNA 1040000  Polysubstance abuse: uds + HTC and cocaine, also + for opioids but urine sample obtained after pain meds. education provided, patient got very offensive and upset when talking about drug use, she vehemently denies current drug use.  Hypomagnesemia: replace mag  Cigarette smoking: on nicotine patch  Code Status: full  Family Communication: patient   Disposition Plan: pending   Consultants:  GI  Procedures:  Diagnostic paracentesis done in the ED  Therapeutic paracentesis  by IR on 5/21, not successful  Therapeutic paracentesis by IR on 5/22  Antibiotics:  Cirpo on 5/18  flagyl on 5/18 then restarted on 5/21  Rocephin from 5/19     Objective: BP 116/81 mmHg  Pulse 72  Temp(Src) 98.3 F (36.8 C) (Oral)  Resp 20  Ht 5' (1.524 m)  Wt 94.257 kg (207 lb 12.8 oz)  BMI 40.58 kg/m2  SpO2 100%  Intake/Output Summary (Last 24 hours) at 05/28/15 0859 Last data filed at 05/27/15 1800  Gross per 24 hour  Intake    840 ml  Output      0 ml  Net    840 ml   Filed Weights   05/25/15 0300  Weight: 94.257 kg (207 lb 12.8 oz)    Exam:   General:  Aaox3, in pain, not comfortable  Cardiovascular: RRR  Respiratory: CTABL  Abdomen: distended, diffuse tender, ? Peritoneal sign, positive bowel sounds  Musculoskeletal:  Pitting Bilateral lower extremity edema has improved  Neuro: oriented x3  Data Reviewed: Basic Metabolic Panel:  Recent Labs Lab 05/24/15 1158 05/25/15 0427 05/26/15 0539 05/27/15 0600 05/28/15 0535  NA 138 132* 135 134* 136  K 4.0 3.3* 4.0 4.0 4.3  CL 106 103 104 104 101  CO2 GLUCOSE 112* 116* 106* 88 94  BUN CREATININE 0.61 0.69 0.74 0.67 0.87  CALCIUM 8.3* 7.6* 7.9* 7.9* 8.2*  MG  --  1.5* 1.7  --  1.6*   Liver Function Tests:  Recent Labs Lab 05/24/15 1158 05/25/15 0427 05/26/15 0539 05/27/15 0600 05/28/15 0535  AST 141* 108* 79* 79* 87*  ALT 62* 52 41 41  40  ALKPHOS 196* 133* 140* 148* 148*  BILITOT 3.8* 5.3* 2.9* 2.6* 2.2*  PROT 7.7 6.9 5.9* 6.1* 6.2*  ALBUMIN 2.5* 2.2* 1.9* 1.9* 1.9*    Recent Labs Lab 05/24/15 1158  LIPASE 20    Recent Labs Lab 05/25/15 0427 05/26/15 0539  AMMONIA 58* 55*   CBC:  Recent Labs Lab 05/24/15 1158 05/25/15 0427 05/26/15 0539 05/27/15 0600 05/28/15 0535  WBC 4.8 13.3* 8.8 5.1 3.7*  HGB 12.8 11.5* 10.7* 11.2* 10.9*  HCT 36.2 32.0* 30.3* 31.9* 31.2*  MCV 100.8* 100.6* 102.7* 102.6* 102.6*  PLT 122* 93* 78* 87* 91*    Cardiac Enzymes:   No results for input(s): CKTOTAL, CKMB, CKMBINDEX, TROPONINI in the last 168 hours. BNP (last 3 results) No results for input(s): BNP in the last 8760 hours.  ProBNP (last 3 results) No results for input(s): PROBNP in the last 8760 hours.  CBG: No results for input(s): GLUCAP in the last 168 hours.  Recent Results (from the past 240 hour(s))  Body fluid culture     Status: None (Preliminary result)   Collection Time: 05/24/15  7:37 PM  Result Value Ref Range Status   Specimen Description PERITONEAL CAVITY  Final   Special Requests Normal  Final   Gram Stain   Final    MODERATE WBC PRESENT,BOTH PMN AND MONONUCLEAR NO ORGANISMS SEEN    Culture   Final    NO GROWTH 3 DAYS [facility]    Report Status PENDING  Incomplete  Culture, blood (routine x 2)     Status: None (Preliminary result)   Collection Time: 05/25/15 10:32 AM  Result Value Ref Range Status   Specimen Description BLOOD RIGHT ARM  Final   Special Requests BOTTLES DRAWN AEROBIC AND ANAEROBIC 7CC  Final   Culture   Final    NO GROWTH 2 DAYS [facility] at [facility]    Report Status PENDING  Incomplete  Culture, blood (routine x 2)     Status: None (Preliminary result)   Collection Time: 05/25/15 10:32 AM  Result Value Ref Range Status   Specimen Description BLOOD RIGHT HAND  Final   Special Requests IN PEDIATRIC BOTTLE 3CC  Final   Culture   Final    NO GROWTH 2 DAYS Performed at [facility]    Report Status PENDING  Incomplete  MRSA PCR Screening     Status: None   Collection Time: 05/25/15 11:23 AM  Result Value Ref Range Status   MRSA by PCR NEGATIVE NEGATIVE Final    Comment:        The GeneXpert MRSA Assay (FDA approved for NASAL specimens only), is one component of a comprehensive MRSA colonization surveillance program. It is not intended to diagnose MRSA infection nor to guide or monitor treatment for MRSA infections.       Studies: [place] Paracentesis  05/27/2015  INDICATION: ascites EXAM: ULTRASOUND-GUIDED PARACENTESIS COMPARISON:  None. MEDICATIONS: 10 cc 1% lidocaine COMPLICATIONS: None immediate. TECHNIQUE: Informed written consent was obtained from the patient after a discussion of the risks, benefits and alternatives to treatment. A timeout was performed prior to the initiation of the procedure. Initial ultrasound scanning demonstrates a very small amount of ascites within the right lower abdominal quadrant close to liver. The right lower abdomen was prepped and draped in the usual sterile fashion. 1% lidocaine with epinephrine was used for local anesthesia. Under direct ultrasound guidance, a 19 gauge, 7-cm, Yueh catheter was introduced. An  ultrasound image was saved for documentation purposed. Although was able to access small pocket of fluid only approximately 1 cc yellow fluid was able to be aspirated. Subsequent ultrasound imaging failed to delineate a sizable pocket amenable to ultrasound-guided aspiration. The paracentesis was not performed. The catheter was removed and a dressing was applied. The patient tolerated the procedure well without immediate post procedural complication. FINDINGS: A total of approximately 1 cc of yellow fluid was removed. Given small size, no fluid was sent to the laboratory for analysis. IMPRESSION: Unsuccessful ultrasound-guided paracentesis yielding only 1 cc of peritoneal fluid. Given small size of peritoneal fluid, additional attempts at ultrasound-guided paracentesis were not performed. Read by: Robet Leu [facility] Electronically Signed   By: Simonne Come M.D.   On: 05/27/2015 11:23    Scheduled Meds: . cefTRIAXone (ROCEPHIN)  IV  2 g Intravenous Q24H  . magnesium sulfate 1 - 4 g bolus IVPB  2 g Intravenous Once  . metronidazole  500 mg Intravenous Q8H  . nicotine  7 mg Transdermal Daily    Continuous Infusions:    Time spent:  Ayaka Andes MD, PhD  [facility] Pager [phone]. If 7PM-7AM, please contact night-coverage at [email or web address], password [facility] 05/28/2015, 8:59 AM  LOS: 4 days

## 2015-05-28 NOTE — Progress Notes (Signed)
Patient ID: [name], female   DOB: August 20, 1965, 50 y.o.   MRN: [id number] Attempt made at US guided paracentesis; however, the pocket of fluid above her liver is very small.  As the patient was being anesthetized, she tensed her abdominal muscles.  As she does this the pocket of fluid completely collapses, even with getting the patient to relax, the pocket of fluid is even smaller.  This is now too high risk for injury to her liver or her bowel that is floating up to her abdominal wall.  Procedure was terminated.  Rhoda Waldvogel E 11:26 AM 05/28/2015

## 2015-05-28 NOTE — Progress Notes (Signed)
Daily Rounding Note  05/28/2015, 10:09 AM  LOS: 4 days   SUBJECTIVE:       Ms. All is found sitting up on the side of the bed this morning, she appears much more enthusiastic then during the time of my previous exam on Friday. Patient explains that she had a paracentesis yesterday and they were only able to withdraw 1 mL of fluid. She tells me that there are plans for repeat today, although she is "unsure how much they will get now". Patient tells me her abdomen continues to "ache", but this is better than at the time of admission. Patient questions whether or not she can have a shower. She tells me that she is "ready to go home".  Patient denies any fever, chills, change in bowel habits, weight change or worsening abdominal pain.  ROS: Gen: no fever or chills CV: no c/p Resp: no SOB or cough  OBJECTIVE:         Vital signs in last 24 hours:    Temp:  [97.9 F (36.6 C)-98.3 F (36.8 C)] 98.3 F (36.8 C) (05/22 0456) Pulse Rate:  [61-72] 72 (05/22 0456) Resp:  [18-20] 20 (05/22 0456) BP: (107-121)/(71-84) 116/81 mmHg (05/22 0456) SpO2:  [100 %] 100 % (05/22 0456) Last BM Date: 05/24/15   Filed Weights   05/25/15 0300  Weight: 94.257 kg (207 lb 12.8 oz)   General: Comfortable in no acute distress  HEENT: Sclera mildly icteric, moist mucosa Cardiac: RRR without murmurs, S1 and S2, no edema Pulm: Clear to auscultation bilaterally, no wheezes or rhonchi, normal effort Abdomen: Soft, mild diffuse tenderness, active bowel sounds in all 4 quadrants. No guarding, palpation limited by abdominal girth. Reducible umbilical hernia again noted. Extremities: Continued numbness on right foot Skin: Warm and dry no jaundice Neuro/Psych:  Cooperative, normal judgment  Intake/Output from previous day: 05/21 0701 - 05/22 0700 In: 840 [P.O.:840] Out: -    Lab Results:  Recent Labs  05/26/15 0539 05/27/15 0600  05/28/15 0535  WBC 8.8 5.1 3.7*  HGB 10.7* 11.2* 10.9*  HCT 30.3* 31.9* 31.2*  PLT 78* 87* 91*   BMET  Recent Labs  05/26/15 0539 05/27/15 0600 05/28/15 0535  NA 135 134* 136  K 4.0 4.0 4.3  CL 104 104 101  CO2 GLUCOSE 106* 88 94  BUN CREATININE 0.74 0.67 0.87  CALCIUM 7.9* 7.9* 8.2*   LFT  Recent Labs  05/26/15 0539 05/27/15 0600 05/28/15 0535  PROT 5.9* 6.1* 6.2*  ALBUMIN 1.9* 1.9* 1.9*  AST 79* 79* 87*  ALT 41 41 40  ALKPHOS 140* 148* 148*  BILITOT 2.9* 2.6* 2.2*   PT/INR  Recent Labs  05/27/15 0600 05/28/15 0535  LABPROT 21.2* 21.8*  INR 1.92* 1.99*   Hepatitis Panel No results for input(s): HEPBSAG, HCVAB, HEPAIGM, HEPBIGM in the last 72 hours.  Studies/Results: US Paracentesis  05/27/2015  INDICATION: ascites EXAM: ULTRASOUND-GUIDED PARACENTESIS COMPARISON:  None. MEDICATIONS: 10 cc 1% lidocaine COMPLICATIONS: None immediate. TECHNIQUE: Informed written consent was obtained from the patient after a discussion of the risks, benefits and alternatives to treatment. A timeout was performed prior to the initiation of the procedure. Initial ultrasound scanning demonstrates a very small amount of ascites within the right lower abdominal quadrant close to liver. The right lower abdomen was prepped and draped in the usual sterile fashion. 1% lidocaine with epinephrine was used for local anesthesia.  Under direct ultrasound guidance, a 19 gauge, 7-cm, Yueh catheter was introduced. An ultrasound image was saved for documentation purposed. Although was able to access small pocket of fluid only approximately 1 cc yellow fluid was able to be aspirated. Subsequent ultrasound imaging failed to delineate a sizable pocket amenable to ultrasound-guided aspiration. The paracentesis was not performed. The catheter was removed and a dressing was applied. The patient tolerated the procedure well without immediate post procedural complication. FINDINGS: A total of  approximately 1 cc of yellow fluid was removed. Given small size, no fluid was sent to the laboratory for analysis. IMPRESSION: Unsuccessful ultrasound-guided paracentesis yielding only 1 cc of peritoneal fluid. Given small size of peritoneal fluid, additional attempts at ultrasound-guided paracentesis were not performed. Read by: Robet Leu [facility] Electronically Signed   By: Simonne Come M.D.   On: 05/27/2015 11:23   EXAM: ULTRASOUND-GUIDED PARACENTESIS 05/27/15  COMPARISON: None.  MEDICATIONS: 10 cc 1% lidocaine  COMPLICATIONS: None immediate.  TECHNIQUE: Informed written consent was obtained from the patient after a discussion of the risks, benefits and alternatives to treatment. A timeout was performed prior to the initiation of the procedure.  Initial ultrasound scanning demonstrates a very small amount of ascites within the right lower abdominal quadrant close to liver. The right lower abdomen was prepped and draped in the usual sterile fashion. 1% lidocaine with epinephrine was used for local anesthesia. Under direct ultrasound guidance, a 19 gauge, 7-cm, Yueh catheter was introduced. An ultrasound image was saved for documentation purposed.  Although was able to access small pocket of fluid only approximately 1 cc yellow fluid was able to be aspirated.  Subsequent ultrasound imaging failed to delineate a sizable pocket amenable to ultrasound-guided aspiration.  The paracentesis was not performed. The catheter was removed and a dressing was applied. The patient tolerated the procedure well without immediate post procedural complication.  FINDINGS: A total of approximately 1 cc of yellow fluid was removed. Given small size, no fluid was sent to the laboratory for analysis.  IMPRESSION: Unsuccessful ultrasound-guided paracentesis yielding only 1 cc of peritoneal fluid. Given small size of peritoneal fluid, additional attempts at ultrasound-guided  paracentesis were not performed.  Read by:  Robet Leu [facility]   Electronically Signed  By: Simonne Come M.D.  On: 05/27/2015 11:23  ASSESMENT:  1. Cirrhosis: Thought due to prior alcohol abuse and chronic hepatitis C 2. Chronic hep C infection 3. Ascites and portal hypertension: Currently being treated for SBP 4. History of polysubstance abuse evidence by toxicology screen on admission  5. Abnormal CT of the abdomen: showing thickening of the right colon wall, patient currently on antibiotics. Clean   PLAN  1. Continue antibiotic therapy 2. Continue to hold diuretics until resolution of SBP 3. Likely repeat paracentesis will be unyielding today as there was not enough fluid yesterday, patient actually admits to decreasing abdominal pain 4. Continue supportive measures 5. OK for patient to have shower 6. Will discuss with Dr. Christella Hartigan, please await any further recommendations    Thank you for your kind consultation, we will continue to follow.  Linda Vaughn  05/28/2015, 10:09 AM Pager: [phone] Phone: [phone]  ________________________________________________________________________  [facility] MD note:  I personally examined the patient, reviewed the data and agree with the assessment and plan described above.  Very little ascites fluid, unable to repeat paracentesis again today. That is ok since she clinically seems much improved. No need to try again.  She should continue IV antibiotics for her  SBP until Wednesday (5 days) and then Abx can be stopped. She will need prophylaxis with cipro 500 once daily indefinitely following IV antibiotics.     Rob Buntinganiel Jacobs, MD [facility] Pager [phone]

## 2015-05-29 ENCOUNTER — Other Ambulatory Visit: Payer: Self-pay

## 2015-05-29 LAB — COMPREHENSIVE METABOLIC PANEL
ALK PHOS: 149 U/L — AB (ref 38–126)
ALT: 40 U/L (ref 14–54)
AST: 95 U/L — ABNORMAL HIGH (ref 15–41)
Albumin: 1.8 g/dL — ABNORMAL LOW (ref 3.5–5.0)
Anion gap: 3 — ABNORMAL LOW (ref 5–15)
BILIRUBIN TOTAL: 2.2 mg/dL — AB (ref 0.3–1.2)
BUN: 8 mg/dL (ref 6–20)
CALCIUM: 7.6 mg/dL — AB (ref 8.9–10.3)
CO2: 28 mmol/L (ref 22–32)
Chloride: 104 mmol/L (ref 101–111)
Creatinine, Ser: 0.66 mg/dL (ref 0.44–1.00)
GFR calc non Af Amer: >60 mL/min (ref >60)
GLUCOSE: 95 mg/dL (ref 65–99)
Potassium: 3.7 mmol/L (ref 3.5–5.1)
Sodium: 135 mmol/L (ref 135–145)
TOTAL PROTEIN: 5.6 g/dL — AB (ref 6.5–8.1)

## 2015-05-29 LAB — PROTIME-INR
INR: 2.29 — AB (ref 0.00–1.49)
PROTHROMBIN TIME: 24.2 s — AB (ref 11.6–15.2)

## 2015-05-29 LAB — CBC
HEMATOCRIT: 31.5 % — AB (ref 36.0–46.0)
HEMOGLOBIN: 10.9 g/dL — AB (ref 12.0–15.0)
MCH: 34.9 pg — AB (ref 26.0–34.0)
MCHC: 34.6 g/dL (ref 30.0–36.0)
MCV: 101 fL — AB (ref 78.0–100.0)
Platelets: 83 10*3/uL — ABNORMAL LOW (ref 150–400)
RBC: 3.12 MIL/uL — ABNORMAL LOW (ref 3.87–5.11)
RDW: 14.6 % (ref 11.5–15.5)
WBC: 3.6 10*3/uL — ABNORMAL LOW (ref 4.0–10.5)

## 2015-05-29 LAB — MAGNESIUM: Magnesium: 1.6 mg/dL — ABNORMAL LOW (ref 1.7–2.4)

## 2015-05-29 MED ORDER — MAGNESIUM OXIDE 400 (241.3 MG) MG PO TABS
400.0000 mg | ORAL_TABLET | Freq: Every day | ORAL | Status: DC
  Administered 2015-05-30: 400 mg via ORAL
  Filled 2015-05-29: qty 1

## 2015-05-29 NOTE — Progress Notes (Signed)
[facility] Progress Note    Since last GI note: Still mild abd discomfort but much improved since admission.  Eating well, ambulating.  Objective: Vital signs in last 24 hours: Temp:  [98.4 F (36.9 C)-98.9 F (37.2 C)] 98.6 F (37 C) (05/23 0457) Pulse Rate:  [56-69] 64 (05/23 0457) Resp:  [18-20] 20 (05/23 0457) BP: (111-116)/(63-70) 116/66 mmHg (05/23 0457) SpO2:  [97 %-98 %] 97 % (05/23 0457) Last BM Date: 05/28/15 General: alert and oriented times 3 Heart: regular rate and rythm Abdomen: soft, non-tender, non-distended, normal bowel sounds No lower extremity edema  Lab Results:  Recent Labs  05/27/15 0600 05/28/15 0535 05/29/15 0601  WBC 5.1 3.7* 3.6*  HGB 11.2* 10.9* 10.9*  PLT 87* 91* 83*  MCV 102.6* 102.6* 101.0*    Recent Labs  05/27/15 0600 05/28/15 0535 05/29/15 0601  NA 134* 136 135  K 4.0 4.3 3.7  CL 104 101 104  CO2 26 29 28   GLUCOSE 88 94 95  BUN 7 8 8   CREATININE 0.67 0.87 0.66  CALCIUM 7.9* 8.2* 7.6*    Recent Labs  05/27/15 0600 05/28/15 0535 05/29/15 0601  PROT 6.1* 6.2* 5.6*  ALBUMIN 1.9* 1.9* 1.8*  AST 79* 87* 95*  ALT 41 40 40  ALKPHOS 148* 148* 149*  BILITOT 2.6* 2.2* 2.2*    Recent Labs  05/28/15 0535 05/29/15 0601  INR 1.99* 2.29*     Studies/Results: [place] Abdomen Limited  05/28/2015  CLINICAL DATA:  History of hepatic cirrhosis and SBP. Request has been made for a diagnostic paracentesis. EXAM: LIMITED ABDOMEN ULTRASOUND FOR ASCITES TECHNIQUE: Limited ultrasound survey for ascites was performed in all four abdominal quadrants. COMPARISON:  None. FINDINGS: Minimal right upper quadrant ascites noted just over the liver. An initial attempt to perform a paracentesis was done ; however, when the patient with tense her abdominal muscles the fluid pocket would completely collapse and her liver and bowel were right up next to her abdominal wall. IMPRESSION: Minimal right upper quadrant abdominal ascites. Unsafe  window to proceed with paracentesis. Read by: Barnetta ChapelKelly Osborne, PA-C Electronically Signed   By: Corlis Leak  Hassell M.D.   On: 05/28/2015 12:33   [place] Paracentesis  05/27/2015  INDICATION: ascites EXAM: ULTRASOUND-GUIDED PARACENTESIS COMPARISON:  None. MEDICATIONS: 10 cc 1% lidocaine COMPLICATIONS: None immediate. TECHNIQUE: Informed written consent was obtained from the patient after a discussion of the risks, benefits and alternatives to treatment. A timeout was performed prior to the initiation of the procedure. Initial ultrasound scanning demonstrates a very small amount of ascites within the right lower abdominal quadrant close to liver. The right lower abdomen was prepped and draped in the usual sterile fashion. 1% lidocaine with epinephrine was used for local anesthesia. Under direct ultrasound guidance, a 19 gauge, 7-cm, Yueh catheter was introduced. An ultrasound image was saved for documentation purposed. Although was able to access small pocket of fluid only approximately 1 cc yellow fluid was able to be aspirated. Subsequent ultrasound imaging failed to delineate a sizable pocket amenable to ultrasound-guided aspiration. The paracentesis was not performed. The catheter was removed and a dressing was applied. The patient tolerated the procedure well without immediate post procedural complication. FINDINGS: A total of approximately 1 cc of yellow fluid was removed. Given small size, no fluid was sent to the laboratory for analysis. IMPRESSION: Unsuccessful ultrasound-guided paracentesis yielding only 1 cc of peritoneal fluid. Given small size of peritoneal fluid, additional attempts at ultrasound-guided paracentesis were not performed. Read by: Steilacoom CellarPamela A  Carleene Mains [facility] Electronically Signed   By: Simonne Come M.D.   On: 05/27/2015 11:23     Medications: Scheduled Meds: . cefTRIAXone (ROCEPHIN)  IV  2 g Intravenous Q24H  . metronidazole  500 mg Intravenous Q8H  . nicotine  7 mg Transdermal Daily  . polyethylene  glycol  17 g Oral Daily  . senna-docusate  1 tablet Oral BID   Continuous Infusions:  PRN Meds:.ondansetron (ZOFRAN) IV, oxyCODONE    Assessment/Plan: 50 y.o. female with Hep C, prev Etoh abuse Cirrhosis; SBP clinically improving  She should complete another 24 hours of ceftriaxone and then she is OK for discharge on once daily cipro  for indefinite SBP prophylaxis.   We will set her up for out patient appt in 6-8 weeks with Dr. Myrtie Neither; she may need to start diuretics, she will likely need colonoscopy and EGD (to evaluate thickened right colon and screen for varices respectively).  Over time she could be considered for Hep C eradication.   Please call or page with any further questions or concerns.    Rachael Fee, MD  05/29/2015, 7:52 AM [facility] Pager [phone]

## 2015-05-29 NOTE — Progress Notes (Signed)
Patient ID: Linda DarnerJarryl D Schleich, female   DOB: 13-Nov-1965, 50 y.o.   MRN: [id number]   Office follow up appt scheduled with Dr Myrtie Neitheranis on June 27 at 11:30 am

## 2015-05-29 NOTE — Telephone Encounter (Signed)
Error

## 2015-05-29 NOTE — Progress Notes (Addendum)
PROGRESS NOTE  Linda Vaughn GNF:[id number] DOB: 1965-02-10 DOA: 05/24/2015 PCP: [name], FNP  Brief summary:  Linda Vaughn is a 50 y.o. female  With remote h/o alcohol use (reported quit 20yrs ago), h/o gallstone s/p cholecystectomy a few years ago presented to [facility] ED due to above complaints, Ct ab/pel with cirrhosis/ascites/ ? Nonspecific colitis and stable umbilical hernia, mild splenomegaly, diagnostic paracentesis underwent by EDP, with sbp. Labs with macrocytosis, mild thrombocytopenia, normal renal function, normal lipase level and elevated lft's. hospitalist called to admit the patient Patient denies diarrhea, no hematochezia or hematemesis.  Patient is started on abx, gi consulted. 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  Will hold off diuretics for now until infection adequately controlled. abx adjusted per GI. Abdominal pain returned on 5/21 with abdominal distension and diffuse tender, ? Peritoneal sign, US guided paracentesis was not successful,  attempted on 5/22 but again not successful GI oked to not to repeat paracentesis, continue iv abx, plan to d/c home on 5/24 with close GI follow up  Sepsis: with fever 100.2, leukocytosis 13.3, sbp, and possible colitis, lactic acid procalcitonin level unremarkable, sepsis order set utilized, avoid ivf for now due to ascites, bp stable  so far. On cipro/flagyl initially, now on rocephin and flagyl per gi  Colitis? Seen on ct ab, On abx  Hepatitis C, new diagnose. hcv RNA 1040000, gi following  Hypomagnesemia: replace mag  Polysubstance abuse: uds + HTC and cocaine, also + for opioids but urine sample obtained after pain meds. education provided, patient got very offensive and upset when talking about drug use, she vehemently denies current drug use.  Cigarette smoking: on nicotine patch  Code Status: full  Family Communication: patient   Disposition Plan: d/c home on 5/24 after finish iv rocephin per gi   Consultants:  GI  Procedures:  Diagnostic paracentesis done in the ED  Therapeutic paracentesis by IR on 5/21, not successful  Therapeutic paracentesis by IR on 5/22, not successful  Antibiotics:  Cirpo on 5/18  flagyl on 5/18 then restarted on 5/21  Rocephin from 5/19    Objective: BP 116/66 mmHg  Pulse 64  Temp(Src) 98.6 F (37 C) (Oral)  Resp 20  Ht 5' (1.524 m)  Wt 94.257 kg (207 lb 12.8 oz)  BMI 40.58 kg/m2  SpO2 97%  Intake/Output Summary (Last 24 hours) at 05/29/15 1924 Last data filed at 05/29/15 1733  Gross per 24 hour  Intake    870 ml  Output      0 ml  Net    870 ml   Filed Weights   05/25/15 0300  Weight: 94.257 kg (207 lb 12.8 oz)    Exam:   General:  Aaox3, less pain, ambulating  Cardiovascular: RRR  Respiratory: CTABL  Abdomen: distended, mild tender, positive bowel sounds  Musculoskeletal:  Pitting Bilateral lower extremity edema has improved  Neuro: oriented x3  Data  Reviewed: Basic Metabolic Panel:  Recent Labs Lab 05/25/15 0427 05/26/15 0539 05/27/15 0600 05/28/15 0535 05/29/15 0601  NA 132* 135 134* 136 135  K 3.3* 4.0 4.0 4.3 3.7  CL 103 104 104 101 104  CO2 GLUCOSE 116* 106* 88 94 95  BUN CREATININE 0.69 0.74 0.67 0.87 0.66  CALCIUM 7.6* 7.9* 7.9* 8.2* 7.6*  MG 1.5* 1.7  --  1.6* 1.6*   Liver  Function Tests:  Recent Labs Lab 05/25/15 0427 05/26/15 0539 05/27/15 0600 05/28/15 0535 05/29/15 0601  AST 108* 79* 79* 87* 95*  ALT 52 41 41 40 40  ALKPHOS 133* 140* 148* 148* 149*  BILITOT 5.3* 2.9* 2.6* 2.2* 2.2*  PROT 6.9 5.9* 6.1* 6.2* 5.6*  ALBUMIN 2.2* 1.9* 1.9* 1.9* 1.8*    Recent Labs Lab 05/24/15 1158  LIPASE 20    Recent Labs Lab 05/25/15 0427 05/26/15 0539  AMMONIA 58* 55*   CBC:  Recent Labs Lab 05/25/15 0427 05/26/15 0539 05/27/15 0600 05/28/15 0535 05/29/15 0601  WBC 13.3* 8.8 5.1 3.7* 3.6*  HGB 11.5* 10.7* 11.2* 10.9* 10.9*  HCT 32.0* 30.3* 31.9* 31.2* 31.5*  MCV 100.6* 102.7* 102.6* 102.6* 101.0*  PLT 93* 78* 87* 91* 83*   Cardiac Enzymes:   No results for input(s): CKTOTAL, CKMB, CKMBINDEX, TROPONINI in the last 168 hours. BNP (last 3 results) No results for input(s): BNP in the last 8760 hours.  ProBNP (last 3 results) No results for input(s): PROBNP in the last 8760 hours.  CBG: No results for input(s): GLUCAP in the last 168 hours.  Recent Results (from the past 240 hour(s))  Body fluid culture     Status: None   Collection Time: 05/24/15  7:37 PM  Result Value Ref Range Status   Specimen Description PERITONEAL CAVITY  Final   Special Requests Normal  Final   Gram Stain   Final    MODERATE WBC PRESENT,BOTH PMN AND MONONUCLEAR NO ORGANISMS SEEN    Culture   Final    NO GROWTH 3 DAYS Performed at [facility]    Report Status 05/28/2015 FINAL  Final  Culture, blood (routine x 2)     Status: None (Preliminary result)   Collection Time: 05/25/15 10:32 AM  Result Value Ref Range Status   Specimen Description BLOOD RIGHT ARM  Final   Special Requests BOTTLES DRAWN AEROBIC AND ANAEROBIC 7CC  Final   Culture   Final    NO GROWTH 4 DAYS Performed at [facility]    Report Status PENDING  Incomplete  Culture, blood (routine x 2)     Status: None (Preliminary result)   Collection Time: 05/25/15 10:32 AM  Result  Value Ref Range Status   Specimen Description BLOOD RIGHT HAND  Final   Special Requests IN PEDIATRIC BOTTLE 3CC  Final   Culture   Final    NO GROWTH 4 DAYS Performed at [facility]    Report Status PENDING  Incomplete  MRSA PCR Screening     Status: None   Collection Time: 05/25/15 11:23 AM  Result Value Ref Range Status   MRSA by PCR NEGATIVE NEGATIVE Final    Comment:        The GeneXpert MRSA Assay (FDA approved for NASAL specimens only), is one component of a comprehensive MRSA colonization surveillance program. It is not intended to diagnose MRSA infection nor to guide or monitor treatment for  MRSA infections.      Studies: No results found.  Scheduled Meds: . cefTRIAXone (ROCEPHIN)  IV  2 g Intravenous Q24H  . [START ON 05/30/2015] magnesium oxide  400 mg Oral Daily  . metronidazole  500 mg Intravenous Q8H  . nicotine  7 mg Transdermal Daily  . polyethylene glycol  17 g Oral Daily  . senna-docusate  1 tablet Oral BID    Continuous Infusions:    Time spent: 25mins  Eulogio Requena MD, PhD  [facility] Pager [phone]. If 7PM-7AM, please contact night-coverage at [email or web address], password [email or web address] 05/29/2015, 7:24 PM  LOS: 5 days

## 2015-05-30 LAB — CULTURE, BLOOD (ROUTINE X 2)
CULTURE: NO GROWTH
Culture: NO GROWTH

## 2015-05-30 LAB — COMPREHENSIVE METABOLIC PANEL
ALT: 36 U/L (ref 14–54)
AST: 97 U/L — AB (ref 15–41)
Albumin: 1.6 g/dL — ABNORMAL LOW (ref 3.5–5.0)
Alkaline Phosphatase: 138 U/L — ABNORMAL HIGH (ref 38–126)
Anion gap: 4 — ABNORMAL LOW (ref 5–15)
BUN: 10 mg/dL (ref 6–20)
CHLORIDE: 102 mmol/L (ref 101–111)
CO2: 28 mmol/L (ref 22–32)
Calcium: 7.5 mg/dL — ABNORMAL LOW (ref 8.9–10.3)
Creatinine, Ser: 0.79 mg/dL (ref 0.44–1.00)
Glucose, Bld: 94 mg/dL (ref 65–99)
POTASSIUM: 3.9 mmol/L (ref 3.5–5.1)
Sodium: 134 mmol/L — ABNORMAL LOW (ref 135–145)
Total Bilirubin: 1.6 mg/dL — ABNORMAL HIGH (ref 0.3–1.2)
Total Protein: 5.4 g/dL — ABNORMAL LOW (ref 6.5–8.1)

## 2015-05-30 LAB — CBC
HCT: 31.1 % — ABNORMAL LOW (ref 36.0–46.0)
Hemoglobin: 11 g/dL — ABNORMAL LOW (ref 12.0–15.0)
MCH: 36.3 pg — AB (ref 26.0–34.0)
MCHC: 35.4 g/dL (ref 30.0–36.0)
MCV: 102.6 fL — AB (ref 78.0–100.0)
PLATELETS: 90 10*3/uL — AB (ref 150–400)
RBC: 3.03 MIL/uL — AB (ref 3.87–5.11)
RDW: 15 % (ref 11.5–15.5)
WBC: 4.3 10*3/uL (ref 4.0–10.5)

## 2015-05-30 LAB — PROTIME-INR
INR: 2.16 — AB (ref 0.00–1.49)
PROTHROMBIN TIME: 23.9 s — AB (ref 11.6–15.2)

## 2015-05-30 LAB — MAGNESIUM: Magnesium: 1.6 mg/dL — ABNORMAL LOW (ref 1.7–2.4)

## 2015-05-30 MED ORDER — CIPROFLOXACIN HCL 500 MG PO TABS
500.0000 mg | ORAL_TABLET | Freq: Every day | ORAL | Status: DC
Start: 2015-05-30 — End: 2015-10-09

## 2015-05-30 MED ORDER — CIPROFLOXACIN HCL 500 MG PO TABS: 500.0000 mg | ORAL_TABLET | Freq: Two times a day (BID) | ORAL | Status: DC

## 2015-05-30 MED ORDER — OXYCODONE HCL 5 MG PO TABS: 5.0000 mg | ORAL_TABLET | Freq: Four times a day (QID) | ORAL | Status: DC | PRN

## 2015-05-30 MED ORDER — MAGNESIUM OXIDE 400 (241.3 MG) MG PO TABS: 400.0000 mg | ORAL_TABLET | Freq: Every day | ORAL | Status: DC

## 2015-05-30 NOTE — Progress Notes (Signed)
Discharge instructions reviewed with patient utilizing teach back method. No questions at this time patient being discharged to home. 

## 2015-05-30 NOTE — Discharge Summary (Signed)
Physician Discharge Summary  Linda Vaughn:[id number]:[id number] DOB: 18-Nov-1965 DOA: 05/24/2015  PCP: Massie MaroonHollis,Lachina M, FNP  Admit date: 05/24/2015 Discharge date: 05/30/2015  Time spent: 35 minutes  Recommendations for Outpatient Follow-up:  1. Patient was discharged on ciprofloxacin 500 mg by mouth daily for an definite spontaneous bacterial peritonitis prophylaxis.   Discharge Diagnoses:  Active Problems:   Ascites   SBP (spontaneous bacterial peritonitis) (HCC)   Hepatic cirrhosis (HCC)   Chronic hepatitis C without hepatic coma (HCC)   Polysubstance abuse   Sepsis (HCC)   Discharge Condition: Stable  Diet recommendation: Low sodium diet  Filed Weights   05/25/15 0300  Weight: 94.257 kg (207 lb 12.8 oz)    History of present illness:  Linda Vaughn is a 50 y.o. female  With remote h/o alcohol use (reported quit 4671yrs ago), h/o gallstone s/p cholecystectomy a few years ago presented to [facility] ED due to above complaints, Ct ab/pel with cirrhosis/ascites/ ? Nonspecific colitis and stable umbilical hernia, mild splenomegaly, diagnostic paracentesis underwent by EDP, result pending. Labs with macrocytosis, mild thrombocytopenia, normal renal function, normal lipase level and elevated lft's. hospitalist called to admit the patient Patient denies diarrhea, no hematochezia or hematemesis.   Hospital Course:  Mrs Linda Vaughn is a 50 year old female with a past medical history of cirrhosis, remote history of alcohol abuse, chronic hepatitis C who was admitted to medicine service on 05/24/2015 when she presented with complaints of abdominal distention associated with abdominal pain. Initial workup included a CT scan of abdomen and pelvis with IV contrast that revealed findings consistent with cirrhosis with abdominal ascites. There was also CT findings consistent with nonspecific colitis. She was started on empiric IV antibiotic therapy with ciprofloxacin and Flagyl. In her vaginal radiology was  consulted for ultrasound-guided paracentesis however, procedure could not be completed due to pocket of fluid above liver being small. Spontaneous bacterial peritonitis was suspected as a cause of symptoms and GI recommended treating her indefinitely with ciprofloxacin 500 mg by mouth daily. She showed gradual clinical improvement over the course of her hospitalization. During this hospitalization she was found to have  positive hepatitis C with a viral load of 1,040,000. GI recommending outpatient colonoscopy and EGD, and may be considered for hepatitis C eradication. Given clinical stability she was discharged on 05/30/2015 to her home.  Procedures:  Attempted ultrasound-guided paracentesis x 2  Consultations:  GI  Discharge Exam: Filed Vitals:   05/29/15 2200 05/30/15 0600  BP: 119/66 104/63  Pulse: 59 62  Temp: 98.3 F (36.8 C) 98.5 F (36.9 C)  Resp: 18 16    General: Awake and alert, nontoxic-appearing no acute distress Cardiovascular: Regular rate and rhythm normal S1-S2 Respiratory: Normal respiratory effort lungs are clear Abdomen: Patient having mild abdominal distention with some pain to palpation across generalized abdomen Extremities: No edema  Discharge Instructions   Discharge Instructions    Call MD for:  difficulty breathing, headache or visual disturbances    Complete by:  As directed      Call MD for:  difficulty breathing, headache or visual disturbances    Complete by:  As directed      Call MD for:  extreme fatigue    Complete by:  As directed      Call MD for:  extreme fatigue    Complete by:  As directed      Call MD for:  hives    Complete by:  As directed      Call MD for:  hives    Complete by:  As directed      Call MD for:  persistant dizziness or light-headedness    Complete by:  As directed      Call MD for:  persistant dizziness or light-headedness    Complete by:  As directed      Call MD for:  persistant nausea and vomiting    Complete  by:  As directed      Call MD for:  persistant nausea and vomiting    Complete by:  As directed      Call MD for:  redness, tenderness, or signs of infection (pain, swelling, redness, odor or green/yellow discharge around incision site)    Complete by:  As directed      Call MD for:  redness, tenderness, or signs of infection (pain, swelling, redness, odor or green/yellow discharge around incision site)    Complete by:  As directed      Call MD for:  severe uncontrolled pain    Complete by:  As directed      Call MD for:  severe uncontrolled pain    Complete by:  As directed      Call MD for:  temperature >100.4    Complete by:  As directed      Call MD for:  temperature >100.4    Complete by:  As directed      Call MD for:    Complete by:  As directed      Call MD for:    Complete by:  As directed      Diet - low sodium heart healthy    Complete by:  As directed      Diet - low sodium heart healthy    Complete by:  As directed      Increase activity slowly    Complete by:  As directed      Increase activity slowly    Complete by:  As directed           Current Discharge Medication List    START taking these medications   Details  ciprofloxacin (CIPRO) 500 MG tablet Take 1 tablet (500 mg total) by mouth daily with breakfast. Qty: 30 tablet, Refills: 1    magnesium oxide (MAG-OX) 400 (241.3 Mg) MG tablet Take 1 tablet (400 mg total) by mouth daily. Qty: 30 tablet, Refills: 1      CONTINUE these medications which have CHANGED   Details  oxyCODONE (ROXICODONE) 5 MG immediate release tablet Take 1 tablet (5 mg total) by mouth every 6 (six) hours as needed for severe pain. Qty: 10 tablet, Refills: 0       Allergies  Allergen Reactions  . Aspirin Other (See Comments)    Liver damage   . Ibuprofen Other (See Comments)    Liver damage  . Tylenol [Acetaminophen] Other (See Comments)    Liver damage   Follow-up Information    Follow up with Linda M, FNP In 1  week.   Specialty:  Family Medicine   Why:  hospital discharge follow up   Contact information:   [address] [place] [address] [phone]       Follow up with Sherrilyn Rist, MD On 07/03/2015.   Specialty:  Gastroenterology   Why:  hepatitis c /cirrhosis/ascites   Contact information:   [address] [place] [address] [phone]       Follow up with  Linda M, FNP In 2 weeks.   Specialty:  Family Medicine   Contact information:   [address] [place] [address] [phone]        The results of significant diagnostics from this hospitalization (including imaging, microbiology, ancillary and laboratory) are listed below for reference.    Significant Diagnostic Studies: Ct Abdomen Pelvis W Contrast  05/24/2015  CLINICAL DATA:  Midline upper abdominal pain for 24 hours, history of hiatal hernia EXAM: CT ABDOMEN AND PELVIS WITH CONTRAST TECHNIQUE: Multidetector CT imaging of the abdomen and pelvis was performed using the standard protocol following bolus administration of intravenous contrast. CONTRAST:  ISOVUE-300 IOPAMIDOL (ISOVUE-300) INJECTION 61% COMPARISON:  05/07/2015 FINDINGS: Lower chest:  The lung bases are unremarkable. Hepatobiliary: Again noted cirrhotic liver. There is perihepatic and perisplenic ascites again noted. Moderate ascites bilateral paracolic gutters again noted. The patient is status postcholecystectomy. No focal hepatic mass. Pancreas: Enhanced pancreas is unremarkable. Spleen: Mild splenomegaly is stable. Adrenals/Urinary Tract: No adrenal gland mass. Kidneys are symmetrical in size and enhancement. No hydronephrosis or hydroureter. Delayed renal images shows bilateral renal symmetrical excretion. Bilateral visualized proximal ureter is unremarkable. The urinary bladder is unremarkable. Stomach/Bowel: There is no gastric outlet obstruction. Mild distended distal small bowel loops with some fluid  and air-fluid levels are noted lower abdomen see axial image 42. Findings suspicious for enteritis. There is thickening of the cecal wall and proximal right colon. Subtle mild enhancement of the right colon mucosa. Nonspecific colitis cannot be excluded. Clinical correlation is necessary. The appendix is not identified. There is no transition point in caliber of small bowel to suggest small bowel obstruction. Colonic diverticula are noted descending colon and proximal sigmoid colon. There is no evidence of acute diverticulitis or distal colitis. Vascular/Lymphatic: Mild atherosclerotic calcifications of abdominal aorta and iliac arteries. No mesenteric or retroperitoneal adenopathy. Portal vein measures 1.4 cm in diameter the probable due to portal hypertension. Reproductive: The uterus and ovaries are unremarkable. Other: There is moderate ascites bilateral paracolic gutters. Stable umbilical hernia containing fluid. Moderate pelvic ascites is noted. Musculoskeletal: No destructive bony lesions are noted. Sagittal images of the spine shows mild degenerative changes thoracolumbar spine. IMPRESSION: 1. Again noted cirrhosis of the liver and moderate pelvic and abdominal ascites probable due to portal hypertension. Mild splenomegaly. 2. Mild fluid distended with some air-fluid levels distal small bowel loops probable due to ileus or enteritis. No transition point in caliber of small bowel. There is some thickening of cecal wall and proximal right colon. Mild enhancement of right colon mucosa. Findings highly suspicious for nonspecific colitis. Enterocolitis cannot be excluded. The appendix is not identified. 3. Unremarkable uterus and ovaries. 4. Stable umbilical hernia containing fluid. 5. No hydronephrosis or hydroureter. Electronically Signed   By: Natasha Mead Vaughn.D.   On: 05/24/2015 14:48   Ct Abdomen Pelvis W Contrast  05/07/2015  CLINICAL DATA:  Abdominal pain and vomiting, abdominal distention and constipation.  History of cirrhosis, cholecystectomy, hypertension. EXAM: CT ABDOMEN AND PELVIS WITH CONTRAST TECHNIQUE: Multidetector CT imaging of the abdomen and pelvis was performed using the standard protocol following bolus administration of intravenous contrast. CONTRAST:  100 cc ISOVUE-300 IOPAMIDOL (ISOVUE-300) INJECTION 61% COMPARISON:  Abdominal radiograph May 07, 2015 at 0343 hours and CT abdomen and pelvis March 19, 2015 FINDINGS: LUNG BASES: Included view of the lung bases are clear. Visualized heart and pericardium are unremarkable. SOLID ORGANS: Similarly nodular liver with enlarged LEFT lobe consistent with known cirrhosis. Recannulized paraumbilical vein.  Patent portal vein. Mild splenomegaly. Normal appearance of the pancreas and adrenal glands. Status postcholecystectomy. GASTROINTESTINAL TRACT: The stomach, small and large bowel are normal in course and caliber without inflammatory changes. Mild colonic diverticulosis. Mildly edematous small bowel consistent with portal hypertension. The appendix is not discretely identified, however there are no inflammatory changes in the right lower quadrant. KIDNEYS/ URINARY TRACT: Kidneys are orthotopic, demonstrating symmetric enhancement. No nephrolithiasis, hydronephrosis or solid renal masses. The unopacified ureters are normal in course and caliber. Delayed imaging through the kidneys demonstrates symmetric prompt contrast excretion within the proximal urinary collecting system. Urinary bladder is partially distended and unremarkable. PERITONEUM/RETROPERITONEUM: Aortoiliac vessels are normal in course and caliber, mild calcific atherosclerosis. No lymphadenopathy by CT size criteria. Internal reproductive organs are unremarkable. Similar amount of moderate ascites. Similar mesenteric edema. Stable moderate fluid-filled umbilical hernia. SOFT TISSUE/OSSEOUS STRUCTURES: Non-suspicious.  Mild anasarca. IMPRESSION: Stable cirrhosis and sequelae of portal hypertension  including moderate ascites. Electronically Signed   By: Awilda Metro Vaughn.D.   On: 05/07/2015 05:54   US Abdomen Limited  05/28/2015  CLINICAL DATA:  History of hepatic cirrhosis and SBP. Request has been made for a diagnostic paracentesis. EXAM: LIMITED ABDOMEN ULTRASOUND FOR ASCITES TECHNIQUE: Limited ultrasound survey for ascites was performed in all four abdominal quadrants. COMPARISON:  None. FINDINGS: Minimal right upper quadrant ascites noted just over the liver. An initial attempt to perform a paracentesis was done ; however, when the patient with tense her abdominal muscles the fluid pocket would completely collapse and her liver and bowel were right up next to her abdominal wall. IMPRESSION: Minimal right upper quadrant abdominal ascites. Unsafe window to proceed with paracentesis. Read by: Barnetta Chapel, PA-C Electronically Signed   By: Corlis Leak Vaughn.D.   On: 05/28/2015 12:33   US Paracentesis  05/27/2015  INDICATION: ascites EXAM: ULTRASOUND-GUIDED PARACENTESIS COMPARISON:  None. MEDICATIONS: 10 cc 1% lidocaine COMPLICATIONS: None immediate. TECHNIQUE: Informed written consent was obtained from the patient after a discussion of the risks, benefits and alternatives to treatment. A timeout was performed prior to the initiation of the procedure. Initial ultrasound scanning demonstrates a very small amount of ascites within the right lower abdominal quadrant close to liver. The right lower abdomen was prepped and draped in the usual sterile fashion. 1% lidocaine with epinephrine was used for local anesthesia. Under direct ultrasound guidance, a 19 gauge, 7-cm, Yueh catheter was introduced. An ultrasound image was saved for documentation purposed. Although was able to access small pocket of fluid only approximately 1 cc yellow fluid was able to be aspirated. Subsequent ultrasound imaging failed to delineate a sizable pocket amenable to ultrasound-guided aspiration. The paracentesis was not performed.  The catheter was removed and a dressing was applied. The patient tolerated the procedure well without immediate post procedural complication. FINDINGS: A total of approximately 1 cc of yellow fluid was removed. Given small size, no fluid was sent to the laboratory for analysis. IMPRESSION: Unsuccessful ultrasound-guided paracentesis yielding only 1 cc of peritoneal fluid. Given small size of peritoneal fluid, additional attempts at ultrasound-guided paracentesis were not performed. Read by: Robet Leu [facility] Electronically Signed   By: Simonne Come Vaughn.D.   On: 05/27/2015 11:23   Dg Chest Port 1 View  05/25/2015  CLINICAL DATA:  Sepsis.  History of hypertension and smoking. EXAM: PORTABLE CHEST 1 VIEW COMPARISON:  05/07/2015 FINDINGS: The cardiomediastinal silhouette is within normal limits. The lungs are well inflated and clear. There is no evidence of pleural effusion or pneumothorax.  No acute osseous abnormality is identified. IMPRESSION: No active disease. Electronically Signed   By: Sebastian Ache Vaughn.D.   On: 05/25/2015 10:15   Dg Abd Acute W/chest  05/07/2015  CLINICAL DATA:  Severe abdominal pain. EXAM: DG ABDOMEN ACUTE W/ 1V CHEST COMPARISON:  Chest 12/19/2014.  CT abdomen 03/19/2015. FINDINGS: Shallow inspiration with mild atelectasis in the lung bases. No focal consolidation. Normal heart size and pulmonary vascularity. Scattered gas and stool throughout the colon. No small or large bowel distention. There is a single prominent gas-filled small bowel loop in the left mid abdomen with suggestion of fold thickening. This is not significantly distended and likely represents focal ileus or possibly enteritis. No free intra-abdominal air. No abnormal air-fluid levels. Surgical clips in the right upper quadrant. Degenerative changes in the spine and hips. No radiopaque stones. IMPRESSION: Single prominent gas-filled nondilated small bowel loop in the left abdomen with suggestion of fold thickening may represent  localized ileus or enteritis. No findings of obstruction. Electronically Signed   By: Burman Nieves Vaughn.D.   On: 05/07/2015 04:12    Microbiology: Recent Results (from the past 240 hour(s))  Body fluid culture     Status: None   Collection Time: 05/24/15  7:37 PM  Result Value Ref Range Status   Specimen Description PERITONEAL CAVITY  Final   Special Requests Normal  Final   Gram Stain   Final    MODERATE WBC PRESENT,BOTH PMN AND MONONUCLEAR NO ORGANISMS SEEN    Culture   Final    NO GROWTH 3 DAYS Performed at [facility]    Report Status 05/28/2015 FINAL  Final  Culture, blood (routine x 2)     Status: None (Preliminary result)   Collection Time: 05/25/15 10:32 AM  Result Value Ref Range Status   Specimen Description BLOOD RIGHT ARM  Final   Special Requests BOTTLES DRAWN AEROBIC AND ANAEROBIC 7CC  Final   Culture   Final    NO GROWTH 4 DAYS Performed at [facility]    Report Status PENDING  Incomplete  Culture, blood (routine x 2)     Status: None (Preliminary result)   Collection Time: 05/25/15 10:32 AM  Result Value Ref Range Status   Specimen Description BLOOD RIGHT HAND  Final   Special Requests IN PEDIATRIC BOTTLE 3CC  Final   Culture   Final    NO GROWTH 4 DAYS Performed at [facility]    Report Status PENDING  Incomplete  MRSA PCR Screening     Status: None   Collection Time: 05/25/15 11:23 AM  Result Value Ref Range Status   MRSA by PCR NEGATIVE NEGATIVE Final    Comment:        The GeneXpert MRSA Assay (FDA approved for NASAL specimens only), is one component of a comprehensive MRSA colonization surveillance program. It is not intended to diagnose MRSA infection nor to guide or monitor treatment for MRSA infections.      Labs: Basic Metabolic Panel:  Recent Labs Lab 05/25/15 0427 05/26/15 0539 05/27/15 0600 05/28/15 0535 05/29/15 0601 05/30/15 0550  NA 132* 135 134* 136 135 134*  K 3.3* 4.0 4.0 4.3 3.7 3.9  CL  103 104 104 101 104 102  CO2 GLUCOSE 116* 106* 88 94 95 94  BUN CREATININE 0.69 0.74 0.67 0.87 0.66 0.79  CALCIUM 7.6* 7.9* 7.9* 8.2* 7.6* 7.5*  MG 1.5*  1.7  --  1.6* 1.6* 1.6*   Liver Function Tests:  Recent Labs Lab 05/26/15 0539 05/27/15 0600 05/28/15 0535 05/29/15 0601 05/30/15 0550  AST 79* 79* 87* 95* 97*  ALT 41 41 40 40 36  ALKPHOS 140* 148* 148* 149* 138*  BILITOT 2.9* 2.6* 2.2* 2.2* 1.6*  PROT 5.9* 6.1* 6.2* 5.6* 5.4*  ALBUMIN 1.9* 1.9* 1.9* 1.8* 1.6*    Recent Labs Lab 05/24/15 1158  LIPASE 20    Recent Labs Lab 05/25/15 0427 05/26/15 0539  AMMONIA 58* 55*   CBC:  Recent Labs Lab 05/26/15 0539 05/27/15 0600 05/28/15 0535 05/29/15 0601 05/30/15 0550  WBC 8.8 5.1 3.7* 3.6* 4.3  HGB 10.7* 11.2* 10.9* 10.9* 11.0*  HCT 30.3* 31.9* 31.2* 31.5* 31.1*  MCV 102.7* 102.6* 102.6* 101.0* 102.6*  PLT 78* 87* 91* 83* 90*   Cardiac Enzymes: No results for input(s): CKTOTAL, CKMB, CKMBINDEX, TROPONINI in the last 168 hours. BNP: BNP (last 3 results) No results for input(s): BNP in the last 8760 hours.  ProBNP (last 3 results) No results for input(s): PROBNP in the last 8760 hours.  CBG: No results for input(s): GLUCAP in the last 168 hours.     Signed:  Jeralyn Bennett MD.  [facility] 05/30/2015, 12:08 PM

## 2015-05-31 ENCOUNTER — Telehealth: Payer: Self-pay | Admitting: Gastroenterology

## 2015-05-31 NOTE — Telephone Encounter (Signed)
Pt was advised that the Cipro is an antibiotic that is used to prevent bacteria peritonitis.  She should continue indefinitely once daily.  She was given her appt info with Dr Myrtie Neitheranis and she was advised to keep this appt for follow up the pt agreed

## 2015-06-05 ENCOUNTER — Telehealth: Payer: Self-pay | Admitting: Gastroenterology

## 2015-06-05 NOTE — Telephone Encounter (Signed)
Appt has been moved to 06/07/15 945 am with Dr Myrtie Neitheranis, the pt says she feels tight in the abd.

## 2015-06-07 ENCOUNTER — Ambulatory Visit: Payer: Self-pay | Admitting: Gastroenterology

## 2015-06-11 ENCOUNTER — Telehealth: Payer: Self-pay | Admitting: Gastroenterology

## 2015-06-11 MED ORDER — SPIRONOLACTONE 100 MG PO TABS: 100.0000 mg | ORAL_TABLET | Freq: Every day | ORAL | Status: DC

## 2015-06-11 MED ORDER — FUROSEMIDE 40 MG PO TABS: 40.0000 mg | ORAL_TABLET | Freq: Every day | ORAL | Status: DC

## 2015-06-11 NOTE — Telephone Encounter (Signed)
Patient reports that she has had a one week history of pedal edema.  She has been elevating her legs and this no longer helps.  She reports that her stomach is "tight".

## 2015-06-11 NOTE — Telephone Encounter (Signed)
Patient notified of all the orders and discussed a low sodium diet and foods she needs to avoid.  She verbalized understanding of new med starts, low Na diet, and office visit for Friday

## 2015-06-11 NOTE — Telephone Encounter (Signed)
Patient is calling again.  Wants a call back today.

## 2015-06-11 NOTE — Telephone Encounter (Signed)
Discussed with Mike GipAmy Esterwood PA patient to start Furosemide 40 mg 1 po qd Aldactone 100 mg 1 po qd  Follow up in the office this week.  Patient will come in and see Doug SouJessica Zehr, PA on 06/15/15 1:30

## 2015-06-14 ENCOUNTER — Telehealth: Payer: Self-pay | Admitting: Gastroenterology

## 2015-06-14 NOTE — Telephone Encounter (Signed)
Discussed again with the patient needs to be on a 2 gm Na diet.  We reviewed foods again that she should avoid lunch meat, packaged foods, soups.  She is advised that she needs to read food labels and add up her sodium intake to not exceed 2000 mg a day.  She verbalized understanding.

## 2015-06-15 ENCOUNTER — Ambulatory Visit: Payer: Self-pay | Admitting: Gastroenterology

## 2015-07-03 ENCOUNTER — Ambulatory Visit: Payer: Self-pay | Admitting: Gastroenterology

## 2015-08-06 ENCOUNTER — Ambulatory Visit: Payer: Self-pay | Admitting: Gastroenterology

## 2015-08-14 ENCOUNTER — Other Ambulatory Visit: Payer: Self-pay | Admitting: Gastroenterology

## 2015-08-14 ENCOUNTER — Telehealth: Payer: Self-pay | Admitting: Gastroenterology

## 2015-08-14 NOTE — Telephone Encounter (Signed)
Pt. is needing refills for Aldactone 100 mg 1 a day. Pt has missed and no showed 5 appointments since 06-07-2015. Please advise. Thank you.

## 2015-08-14 NOTE — Telephone Encounter (Signed)
Refill for once month with one refill. Must come to lab for BMP in next week. She does that and makes it to an office appointment in the next 2 months or discharge from practice for noncompliance.

## 2015-08-15 NOTE — Telephone Encounter (Signed)
Pt notified and aware. She states she cant come for lab, she has no transportation. This is also the reason she has not been coming to her appointments. I explained how important it is that she have labs in the next week. She still says she cant come. I rescheduled her office visit for mid Sept. Do you still want to order labs and refills? Please advise.

## 2015-08-15 NOTE — Telephone Encounter (Signed)
I am sorry to hear she transportation problems.  There is a city bus route that stops very close to our office and many patients use it.  I cannot continue to prescribe medication for any patient that will not follow up in the clinic or get necessary labs because I will not be able to determine if it continues to be the right medicine at the right dose. Cancel the refill.  She is strongly advised to get a primary care physician whose office she can get to in order to manage this.

## 2015-08-15 NOTE — Telephone Encounter (Signed)
Pt notified and aware of the recommendations by Dr  Myrtie Neitheranis. She states that she can not come to do labs. She is very addiment about transportation issues. Asked her about the bus and she states if she had money for the bus she would come to the office visits and do her labs.  Notified her that we couldn't fill her medications with out the labs and explained why. She agrees to keep her office visit scheduled in Sept. Also advised her to find a primary doctor to help maintain her health and medications.

## 2015-08-16 ENCOUNTER — Ambulatory Visit: Payer: Self-pay | Admitting: Gastroenterology

## 2015-09-20 ENCOUNTER — Ambulatory Visit: Payer: Self-pay | Admitting: Gastroenterology

## 2015-09-23 ENCOUNTER — Other Ambulatory Visit: Payer: Self-pay | Admitting: Gastroenterology

## 2015-10-07 ENCOUNTER — Observation Stay (HOSPITAL_COMMUNITY)
Admission: EM | Admit: 2015-10-07 | Discharge: 2015-10-09 | Disposition: A | Discharge status: Home / Self Care | Payer: Medicaid Other | Attending: Internal Medicine | Admitting: Internal Medicine

## 2015-10-07 ENCOUNTER — Emergency Department (HOSPITAL_COMMUNITY): Payer: Medicaid Other

## 2015-10-07 ENCOUNTER — Encounter (HOSPITAL_COMMUNITY): Payer: Self-pay

## 2015-10-07 DIAGNOSIS — G8929 Other chronic pain: Secondary | ICD-10-CM | POA: Diagnosis present

## 2015-10-07 DIAGNOSIS — R339 Retention of urine, unspecified: Secondary | ICD-10-CM | POA: Diagnosis not present

## 2015-10-07 DIAGNOSIS — B182 Chronic viral hepatitis C: Secondary | ICD-10-CM | POA: Diagnosis present

## 2015-10-07 DIAGNOSIS — M79671 Pain in right foot: Secondary | ICD-10-CM | POA: Diagnosis not present

## 2015-10-07 DIAGNOSIS — K7460 Unspecified cirrhosis of liver: Secondary | ICD-10-CM | POA: Diagnosis not present

## 2015-10-07 DIAGNOSIS — R1084 Generalized abdominal pain: Secondary | ICD-10-CM | POA: Diagnosis present

## 2015-10-07 DIAGNOSIS — M79672 Pain in left foot: Secondary | ICD-10-CM | POA: Insufficient documentation

## 2015-10-07 DIAGNOSIS — E871 Hypo-osmolality and hyponatremia: Secondary | ICD-10-CM | POA: Diagnosis not present

## 2015-10-07 DIAGNOSIS — F17200 Nicotine dependence, unspecified, uncomplicated: Secondary | ICD-10-CM | POA: Diagnosis present

## 2015-10-07 DIAGNOSIS — M1990 Unspecified osteoarthritis, unspecified site: Secondary | ICD-10-CM | POA: Diagnosis present

## 2015-10-07 DIAGNOSIS — I10 Essential (primary) hypertension: Secondary | ICD-10-CM | POA: Diagnosis present

## 2015-10-07 DIAGNOSIS — R188 Other ascites: Secondary | ICD-10-CM | POA: Insufficient documentation

## 2015-10-07 DIAGNOSIS — K7290 Hepatic failure, unspecified without coma: Secondary | ICD-10-CM | POA: Insufficient documentation

## 2015-10-07 DIAGNOSIS — K7682 Hepatic encephalopathy: Secondary | ICD-10-CM | POA: Diagnosis present

## 2015-10-07 DIAGNOSIS — Z79899 Other long term (current) drug therapy: Secondary | ICD-10-CM | POA: Insufficient documentation

## 2015-10-07 DIAGNOSIS — Z9049 Acquired absence of other specified parts of digestive tract: Secondary | ICD-10-CM | POA: Diagnosis not present

## 2015-10-07 DIAGNOSIS — E8770 Fluid overload, unspecified: Secondary | ICD-10-CM | POA: Diagnosis not present

## 2015-10-07 DIAGNOSIS — Z9119 Patient's noncompliance with other medical treatment and regimen: Secondary | ICD-10-CM | POA: Insufficient documentation

## 2015-10-07 DIAGNOSIS — R825 Elevated urine levels of drugs, medicaments and biological substances: Secondary | ICD-10-CM | POA: Diagnosis not present

## 2015-10-07 DIAGNOSIS — M25579 Pain in unspecified ankle and joints of unspecified foot: Secondary | ICD-10-CM | POA: Diagnosis not present

## 2015-10-07 DIAGNOSIS — D6959 Other secondary thrombocytopenia: Secondary | ICD-10-CM | POA: Insufficient documentation

## 2015-10-07 DIAGNOSIS — R109 Unspecified abdominal pain: Secondary | ICD-10-CM

## 2015-10-07 DIAGNOSIS — K8020 Calculus of gallbladder without cholecystitis without obstruction: Secondary | ICD-10-CM | POA: Diagnosis present

## 2015-10-07 DIAGNOSIS — F17210 Nicotine dependence, cigarettes, uncomplicated: Secondary | ICD-10-CM | POA: Insufficient documentation

## 2015-10-07 HISTORY — DX: Unspecified cirrhosis of liver: K74.60

## 2015-10-07 HISTORY — DX: Unspecified viral hepatitis C without hepatic coma: B19.20

## 2015-10-07 LAB — COMPREHENSIVE METABOLIC PANEL
ALBUMIN: 2.3 g/dL — AB (ref 3.5–5.0)
ALK PHOS: 120 U/L (ref 38–126)
ALT: 58 U/L — ABNORMAL HIGH (ref 14–54)
AST: 109 U/L — AB (ref 15–41)
Anion gap: 6 (ref 5–15)
BILIRUBIN TOTAL: 3.7 mg/dL — AB (ref 0.3–1.2)
BUN: 9 mg/dL (ref 6–20)
CALCIUM: 7.9 mg/dL — AB (ref 8.9–10.3)
CO2: 22 mmol/L (ref 22–32)
CREATININE: 0.78 mg/dL (ref 0.44–1.00)
Chloride: 103 mmol/L (ref 101–111)
GFR calc Af Amer: >60 mL/min (ref >60)
GFR calc non Af Amer: >60 mL/min (ref >60)
GLUCOSE: 115 mg/dL — AB (ref 65–99)
Potassium: 3.5 mmol/L (ref 3.5–5.1)
Sodium: 131 mmol/L — ABNORMAL LOW (ref 135–145)
TOTAL PROTEIN: 7.4 g/dL (ref 6.5–8.1)

## 2015-10-07 LAB — CBC
HCT: 32.4 % — ABNORMAL LOW (ref 36.0–46.0)
Hemoglobin: 11.6 g/dL — ABNORMAL LOW (ref 12.0–15.0)
MCH: 36.8 pg — ABNORMAL HIGH (ref 26.0–34.0)
MCHC: 35.8 g/dL (ref 30.0–36.0)
MCV: 102.9 fL — ABNORMAL HIGH (ref 78.0–100.0)
PLATELETS: 85 10*3/uL — AB (ref 150–400)
RBC: 3.15 MIL/uL — ABNORMAL LOW (ref 3.87–5.11)
RDW: 15.2 % (ref 11.5–15.5)
WBC: 3.8 10*3/uL — ABNORMAL LOW (ref 4.0–10.5)

## 2015-10-07 LAB — LIPASE, BLOOD: Lipase: 26 U/L (ref 11–51)

## 2015-10-07 LAB — AMMONIA: AMMONIA: 94 umol/L — AB (ref 9–35)

## 2015-10-07 MED ORDER — SODIUM CHLORIDE 0.9 % IV SOLN
Freq: Once | INTRAVENOUS | Status: AC
  Administered 2015-10-07: 1000 mL via INTRAVENOUS

## 2015-10-07 NOTE — ED Triage Notes (Signed)
Pt comes from home via EMS with complaints of RUQ pain that began this morning due to hx of cirrhosis. Pt moaning in triage.

## 2015-10-07 NOTE — ED Notes (Addendum)
Pt with multiple complaints.  Pt stated "I didn't have Mcaid and wasn't able to go to my doctor.  It's been 2 yrs since I've been to the doctor.  I've been taking my medicine.  My feet hurt.  I used to do drugs but it's been years.  I haven't been able to pee."  Pt denies trauma to feet.  Pt presents with redness to right great toe.  Pt also c/o LUQ pain.  Pt stated "I didn't have a way to the doctor."  Informed pt [facility] will transport if call made 3 days prior to appt.  Pt verbalized understanding.

## 2015-10-07 NOTE — ED Triage Notes (Signed)
Patient c/o "pain all over"  Patient states that she has "severe Liver disease"  States that the pain began this morning.  Patient states that she has had N/V/D.  Denies blood in stool or emesis.  Patient also states that left foot is swollen.  Patient rates pain 10/10

## 2015-10-07 NOTE — ED Notes (Signed)
Patient transported to X-ray 

## 2015-10-07 NOTE — ED Provider Notes (Signed)
[facility]-EMERGENCY DEPT Provider Note   CSN: [id number] Arrival date & time: 10/07/15  2014  By signing my name below, I, Rosario AdieWilliam Andrew Hiatt, attest that this documentation has been prepared under the direction and in the presence of Earley FavorGail Shaarav Ripple, FNP. Electronically Signed: Rosario AdieWilliam Andrew Hiatt, ED Scribe. 10/07/15. 9:43 PM.  History   Chief Complaint Chief Complaint  Patient presents with  . Abdominal Pain   The history is provided by the patient. No language interpreter was used.   HPI Comments: Linda Vaughn is a 50 y.o. female with a PMHx of umbilical hernia, polysubstance abuse, and cholelithiasis, who presents to the Emergency Department complaining of gradual onset, gradually worsening diffuse abdominal pain onset this morning. She rates her pain as 10/10 and notes that it is constant. Pt reports associated nausea, vomiting, diarrhea, abdominal distension, and difficulty urinating secondary to her abdominal pain. Pt notes that she has a hx of similar pain in the past that is attributed to her "severe liver disease"; however, she states that she has not f'd/u w/ her doctor in ~2 years for this problem. She states that she has not had a urine void since the onset of her pain. She has a hx of urinary retention in the past but she notes that it was "years ago". No treatments for pain were tried prior to coming into the ED. Her pain is exacerbated with palpation to the abdomen. Denies bloody stools, constipation, fever, or any other associated symptoms.   Pt additionally c/o bilateral foot pain over the past several days. No hx of similar pain. No hx of Gout. No treatments were tried prior to coming into the ED. Denies numbness, focal weakness, or any other associated symptoms.   Past Medical History:  Diagnosis Date  . Arthritis   . Cirrhosis of liver (HCC)   . Gallstones   . Hepatitis C   . Hypertension    Patient Active Problem List   Diagnosis Date Noted  . Hepatic encephalopathy  (HCC) 10/08/2015  . Generalized abdominal pain 10/08/2015  . Sepsis (HCC)   . SBP (spontaneous bacterial peritonitis) (HCC)   . Hepatic cirrhosis (HCC)   . Chronic hepatitis C without hepatic coma (HCC)   . Polysubstance abuse   . Ascites 05/24/2015  . Tobacco dependence 04/04/2015  . Umbilical hernia without obstruction and without gangrene 04/04/2015  . Chronic ankle pain 01/27/2015  . Obesity 01/27/2015  . Depression 01/27/2015  . Osteoarthritis 01/25/2015  . Cholelithiasis 04/20/2012  . HTN (hypertension) 04/20/2012  . Obesity (BMI 30.0-34.9) 04/20/2012   Past Surgical History:  Procedure Laterality Date  . ANKLE SURGERY    . ANKLE SURGERY    . CARPAL TUNNEL RELEASE    . CESAREAN SECTION    . CHOLECYSTECTOMY     OB History    No data available     Home Medications    Prior to Admission medications   Medication Sig Start Date End Date Taking? Authorizing Provider  furosemide (LASIX) 40 MG tablet Take 1 tablet (40 mg total) by mouth daily. 06/11/15  Yes Charlie PitterHenry L Danis III, MD  spironolactone (ALDACTONE) 100 MG tablet TAKE 1 TABLET (100 MG TOTAL) BY MOUTH DAILY. 08/14/15  Yes Charlie PitterHenry L Danis III, MD  lactulose (CHRONULAC) 10 GM/15ML solution Take 15 mLs (10 g total) by mouth 2 (two) times daily. 10/09/15   Jerald KiefStephen K Chiu, MD   Family History Family History  Problem Relation Age of Onset  . Cancer Mother   .  Rheum arthritis Father   . Diabetes Other    Social History Social History  Substance Use Topics  . Smoking status: Current Every Day Smoker    Packs/day: 0.50  . Smokeless tobacco: Never Used  . Alcohol use No     Comment: occ   Allergies   Aspirin; Ibuprofen; and Tylenol [acetaminophen]  Review of Systems Review of Systems  Constitutional: Negative for fever.  Gastrointestinal: Positive for abdominal distention, abdominal pain, diarrhea, nausea and vomiting. Negative for blood in stool and constipation.  Genitourinary: Positive for difficulty urinating.    Musculoskeletal: Positive for myalgias.  Neurological: Negative for weakness and numbness.  All other systems reviewed and are negative.  Physical Exam Updated Vital Signs BP 110/72 (BP Location: Right Arm)   Pulse 60   Temp 98.7 F (37.1 C) (Oral)   Resp 18   Ht 5\' 8"  (1.727 m) Comment: Per Pt.   Wt 79.6 kg Comment: verified with pt and RN  SpO2 98%   BMI 26.68 kg/m   Physical Exam  Constitutional: She appears well-developed and well-nourished.  HENT:  Head: Normocephalic.  Eyes: Conjunctivae are normal.  Cardiovascular: Normal rate.   Pulmonary/Chest: Effort normal. No respiratory distress.  Abdominal: Soft. She exhibits distension. Bowel sounds are decreased. There is tenderness. A hernia is present. Hernia confirmed positive in the ventral area.  Ventral hernia just above the umbilicus that is firm and is reducible. Pain all the way across the upper quadrants on palpation. Area is distended.   Musculoskeletal: Normal range of motion. She exhibits no edema.  Pain and redness into the joint of the right great toe. onychomycosis under the nail, with thickened nail present. No tibial edema.   Neurological: She is alert.  Skin: Skin is warm and dry.  Several day old laceration over the MCP joint of the right hand of the thumb. Approximately 0.5cm long, 1mm wide, and 1mm deep. No evidence of infection.   Psychiatric: She has a normal mood and affect. Her behavior is normal.  Nursing note and vitals reviewed.  ED Treatments / Results  DIAGNOSTIC STUDIES: Oxygen Saturation is 100% on RA, normal by my interpretation.   COORDINATION OF CARE: 9:19 PM-Discussed next steps with pt. Pt verbalized understanding and is agreeable with the plan.   Labs (all labs ordered are listed, but only abnormal results are displayed) Labs Reviewed  COMPREHENSIVE METABOLIC PANEL - Abnormal; Notable for the following:       Result Value   Sodium 131 (*)    Glucose, Bld 115 (*)    Calcium 7.9  (*)    Albumin 2.3 (*)    AST 109 (*)    ALT 58 (*)    Total Bilirubin 3.7 (*)    All other components within normal limits  CBC - Abnormal; Notable for the following:    WBC 3.8 (*)    RBC 3.15 (*)    Hemoglobin 11.6 (*)    HCT 32.4 (*)    MCV 102.9 (*)    MCH 36.8 (*)    Platelets 85 (*)    All other components within normal limits  AMMONIA - Abnormal; Notable for the following:    Ammonia 94 (*)    All other components within normal limits  URINE RAPID DRUG SCREEN, HOSP PERFORMED - Abnormal; Notable for the following:    Cocaine POSITIVE (*)    Tetrahydrocannabinol POSITIVE (*)    All other components within normal limits  BASIC METABOLIC PANEL - Abnormal;  Notable for the following:    Sodium 133 (*)    Glucose, Bld 102 (*)    Calcium 7.7 (*)    All other components within normal limits  CBC - Abnormal; Notable for the following:    RBC 3.12 (*)    Hemoglobin 11.4 (*)    HCT 32.5 (*)    MCV 104.2 (*)    MCH 36.5 (*)    RDW 15.7 (*)    Platelets 86 (*)    All other components within normal limits  PROTIME-INR - Abnormal; Notable for the following:    Prothrombin Time 18.5 (*)    All other components within normal limits  AMMONIA - Abnormal; Notable for the following:    Ammonia 55 (*)    All other components within normal limits  LIPASE, BLOOD  URINALYSIS, ROUTINE W REFLEX MICROSCOPIC (NOT AT [facility])   Radiology No results found.  Procedures Procedures   Medications Ordered in ED Medications  0.9 %  sodium chloride infusion ( Intravenous Transfusing/Transfer 10/08/15 0820)  lactulose (CHRONULAC) 10 GM/15ML solution 30 g (30 g Oral Given 10/08/15 0508)  iopamidol (ISOVUE-300) 61 % injection 100 mL (100 mLs Intravenous Contrast Given 10/08/15 0639)  cefTRIAXone (ROCEPHIN) 1 g in dextrose 5 % 50 mL IVPB (1 g Intravenous Given 10/08/15 1156)    Initial Impression / Assessment and Plan / ED Course  I have reviewed the triage vital signs and the nursing  notes.  Pertinent labs & imaging results that were available during my care of the patient were reviewed by me and considered in my medical decision making (see chart for details).  Clinical Course    Labs and Ct of abdomen show some increase in ascities no obstruction, but due to changes will admit  Final Clinical Impressions(s) / ED Diagnoses   Final diagnoses:  Generalized abdominal pain  Hepatic encephalopathy (HCC)    New Prescriptions Discharge Medication List as of 10/09/2015 10:00 AM    START taking these medications   Details  lactulose (CHRONULAC) 10 GM/15ML solution Take 15 mLs (10 g total) by mouth 2 (two) times daily., Starting Tue 10/09/2015, No Print       I personally performed the services described in this documentation, which was scribed in my presence. The recorded information has been reviewed and is accurate.    Earley Favor, NP 10/12/15 2018    Earley Favor, NP 10/12/15 2019    Earley Favor, NP 10/14/15 1610    Earley Favor, NP 10/14/15 0556    Earley Favor, NP 10/14/15 9604    Alvira Monday, MD 10/21/15 1646    Earley Favor, NP 10/25/15 5409    Alvira Monday, MD 10/29/15 1345

## 2015-10-08 ENCOUNTER — Emergency Department (HOSPITAL_COMMUNITY): Payer: Medicaid Other

## 2015-10-08 ENCOUNTER — Encounter (HOSPITAL_COMMUNITY): Payer: Self-pay

## 2015-10-08 DIAGNOSIS — K7290 Hepatic failure, unspecified without coma: Secondary | ICD-10-CM | POA: Diagnosis present

## 2015-10-08 DIAGNOSIS — K7031 Alcoholic cirrhosis of liver with ascites: Secondary | ICD-10-CM

## 2015-10-08 DIAGNOSIS — G8929 Other chronic pain: Secondary | ICD-10-CM

## 2015-10-08 DIAGNOSIS — K7682 Hepatic encephalopathy: Secondary | ICD-10-CM | POA: Diagnosis present

## 2015-10-08 DIAGNOSIS — M158 Other polyosteoarthritis: Secondary | ICD-10-CM

## 2015-10-08 DIAGNOSIS — R1084 Generalized abdominal pain: Secondary | ICD-10-CM | POA: Diagnosis not present

## 2015-10-08 DIAGNOSIS — F17200 Nicotine dependence, unspecified, uncomplicated: Secondary | ICD-10-CM

## 2015-10-08 DIAGNOSIS — M25572 Pain in left ankle and joints of left foot: Secondary | ICD-10-CM | POA: Diagnosis not present

## 2015-10-08 DIAGNOSIS — R109 Unspecified abdominal pain: Secondary | ICD-10-CM

## 2015-10-08 DIAGNOSIS — B182 Chronic viral hepatitis C: Secondary | ICD-10-CM | POA: Diagnosis not present

## 2015-10-08 LAB — RAPID URINE DRUG SCREEN, HOSP PERFORMED
AMPHETAMINES: NOT DETECTED
BARBITURATES: NOT DETECTED
BENZODIAZEPINES: NOT DETECTED
Cocaine: POSITIVE — AB
Opiates: NOT DETECTED
Tetrahydrocannabinol: POSITIVE — AB

## 2015-10-08 LAB — URINALYSIS, ROUTINE W REFLEX MICROSCOPIC
BILIRUBIN URINE: NEGATIVE
GLUCOSE, UA: NEGATIVE mg/dL
Hgb urine dipstick: NEGATIVE
KETONES UR: NEGATIVE mg/dL
Leukocytes, UA: NEGATIVE
Nitrite: NEGATIVE
Protein, ur: NEGATIVE mg/dL
SPECIFIC GRAVITY, URINE: 1.007 (ref 1.005–1.030)
pH: 7 (ref 5.0–8.0)

## 2015-10-08 MED ORDER — SODIUM CHLORIDE 0.9% FLUSH
3.0000 mL | Freq: Two times a day (BID) | INTRAVENOUS | Status: DC
  Administered 2015-10-08: 3 mL via INTRAVENOUS

## 2015-10-08 MED ORDER — MORPHINE SULFATE (PF) 2 MG/ML IV SOLN
1.0000 mg | INTRAVENOUS | Status: DC | PRN
  Administered 2015-10-08 (×2): 1 mg via INTRAVENOUS
  Filled 2015-10-08 (×2): qty 1

## 2015-10-08 MED ORDER — LACTULOSE 10 GM/15ML PO SOLN
30.0000 g | Freq: Once | ORAL | Status: AC
  Administered 2015-10-08: 30 g via ORAL
  Filled 2015-10-08: qty 45

## 2015-10-08 MED ORDER — LACTULOSE 10 GM/15ML PO SOLN
10.0000 g | Freq: Two times a day (BID) | ORAL | Status: DC
  Administered 2015-10-09: 10 g via ORAL
  Filled 2015-10-08 (×2): qty 15

## 2015-10-08 MED ORDER — CIPROFLOXACIN HCL 500 MG PO TABS
500.0000 mg | ORAL_TABLET | Freq: Every day | ORAL | Status: DC
  Administered 2015-10-08 – 2015-10-09 (×2): 500 mg via ORAL
  Filled 2015-10-08 (×2): qty 1

## 2015-10-08 MED ORDER — SODIUM CHLORIDE 0.9% FLUSH: 3.0000 mL | INTRAVENOUS | Status: DC | PRN

## 2015-10-08 MED ORDER — ONDANSETRON HCL 4 MG PO TABS: 4.0000 mg | ORAL_TABLET | Freq: Four times a day (QID) | ORAL | Status: DC | PRN

## 2015-10-08 MED ORDER — ONDANSETRON HCL 4 MG/2ML IJ SOLN: 4.0000 mg | Freq: Four times a day (QID) | INTRAMUSCULAR | Status: DC | PRN

## 2015-10-08 MED ORDER — SODIUM CHLORIDE 0.9 % IV SOLN: 250.0000 mL | INTRAVENOUS | Status: DC | PRN

## 2015-10-08 MED ORDER — IOPAMIDOL (ISOVUE-300) INJECTION 61%
100.0000 mL | Freq: Once | INTRAVENOUS | Status: AC | PRN
  Administered 2015-10-08: 100 mL via INTRAVENOUS

## 2015-10-08 MED ORDER — DEXTROSE 5 % IV SOLN
1.0000 g | Freq: Once | INTRAVENOUS | Status: AC
  Administered 2015-10-08: 1 g via INTRAVENOUS
  Filled 2015-10-08: qty 10

## 2015-10-08 MED ORDER — FUROSEMIDE 40 MG PO TABS
40.0000 mg | ORAL_TABLET | Freq: Every day | ORAL | Status: DC
  Administered 2015-10-08 – 2015-10-09 (×2): 40 mg via ORAL
  Filled 2015-10-08 (×2): qty 1

## 2015-10-08 MED ORDER — SPIRONOLACTONE 25 MG PO TABS
100.0000 mg | ORAL_TABLET | Freq: Every day | ORAL | Status: DC
  Administered 2015-10-08 – 2015-10-09 (×2): 100 mg via ORAL
  Filled 2015-10-08 (×2): qty 4

## 2015-10-08 MED ORDER — LACTULOSE 10 GM/15ML PO SOLN: 20.0000 g | Freq: Three times a day (TID) | ORAL | Status: DC

## 2015-10-08 NOTE — H&P (Signed)
History and Physical  Linda Vaughn ZOX:[id number] DOB: 07-08-1965 DOA: 10/07/2015  Referring physician: Redmond School DO PCP: [name], FNP   Chief Complaint: abdominal pain  HPI: Linda Vaughn is a 50 y.o. female PMHx of umbilical hernia, polysubstance abuse, and cholelithiasis, who presents to the Emergency Department complaining of gradual onset, gradually worsening diffuse abdominal pain onset this morning. She rates her pain as 10/10 and notes that it is constant. Pt reports associated nausea, vomiting, diarrhea, abdominal distension, and difficulty urinating secondary to her abdominal pain. Pt notes that she has a hx of similar pain in the past that is attributed to her "severe liver disease"; however, she states that she has not followed up w/ her doctor in ~2 years for this problem. She states that she has not had a urine void since the onset of her pain. She has a hx of urinary retention in the past but she notes that it was "years ago". No treatments for pain were tried prior to coming into the ED. Her pain is exacerbated with palpation to the abdomen. Denies bloody stools, constipation, fever, or any other associated symptoms. Pt additionally c/o bilateral foot pain over the past several days. No hx of similar pain. No hx of Gout. No treatments were tried prior to coming into the ED. Denies numbness, focal weakness, or any other associated symptoms.   Review of Systems:  Review of Systems  Constitutional: Negative for fever.  Gastrointestinal: Positive for abdominal distention, abdominal pain, diarrhea, nausea and vomiting. Negative for blood in stool and constipation.  Genitourinary: Positive for difficulty urinating.  Musculoskeletal: Positive for myalgias.  Neurological: Negative for weakness and numbness.  All other systems reviewed and are negative.  Past Medical History:  Diagnosis Date  . Arthritis   . Cirrhosis of liver (HCC)   . Gallstones   . Hepatitis C   .  Hypertension    Past Surgical History:  Procedure Laterality Date  . ANKLE SURGERY    . ANKLE SURGERY    . CARPAL TUNNEL RELEASE    . CESAREAN SECTION    . CHOLECYSTECTOMY     Social History:  reports that she has been smoking.  She has been smoking about 0.50 packs per day. She has never used smokeless tobacco. She reports that she does not drink alcohol or use drugs.   Allergies  Allergen Reactions  . Aspirin Other (See Comments)    Liver damage   . Ibuprofen Other (See Comments)    Liver damage  . Tylenol [Acetaminophen] Other (See Comments)    Liver damage    Family History  Problem Relation Age of Onset  . Cancer Mother   . Rheum arthritis Father   . Diabetes Other     Prior to Admission medications   Medication Sig Start Date End Date Taking? Authorizing Provider  furosemide (LASIX) 40 MG tablet Take 1 tablet (40 mg total) by mouth daily. 06/11/15  Yes Charlie Pitter III, MD  spironolactone (ALDACTONE) 100 MG tablet TAKE 1 TABLET (100 MG TOTAL) BY MOUTH DAILY. 08/14/15  Yes Charlie Pitter III, MD  ciprofloxacin (CIPRO) 500 MG tablet Take 1 tablet (500 mg total) by mouth daily with breakfast. Patient not taking: Reported on 10/07/2015 05/30/15   Jeralyn Bennett, MD  magnesium oxide (MAG-OX) 400 (241.3 Mg) MG tablet Take 1 tablet (400 mg total) by mouth daily. Patient not taking: Reported on 10/07/2015 05/30/15   Jeralyn Bennett, MD  oxyCODONE (ROXICODONE) 5 MG  immediate release tablet Take 1 tablet (5 mg total) by mouth every 6 (six) hours as needed for severe pain. Patient not taking: Reported on 10/07/2015 05/30/15   Jeralyn Bennett, MD   Physical Exam: Vitals:   10/08/15 0429 10/08/15 0655 10/08/15 0820 10/08/15 0929  BP: 123/79 (!) 112/51 113/65 (!) 107/58  Pulse: 76 75 84 74  Resp: 18 16 16 18   Temp: 98.8 F (37.1 C) 98.8 F (37.1 C)  99.2 F (37.3 C)  TempSrc: Oral Oral  Oral  SpO2: 96% 94% 94% 98%  Weight:    79.6 kg (175 lb 7.8 oz)  Height:    5\' 8"  (1.727 m)    Constitutional: She appears well-developed and well-nourished.  HENT: Head: Normocephalic.  Eyes: Conjunctivae are normal.  Cardiovascular: Normal rate.   Pulmonary/Chest: Effort normal. No respiratory distress.  Abdominal: Soft. She exhibits distension. Bowel sounds are decreased. There is tenderness. A hernia is present. Hernia confirmed positive in the ventral area.  Ventral hernia just above the umbilicus that is firm and is reducible. Pain all the way across the upper quadrants on palpation. Area is distended.   Musculoskeletal: Normal range of motion. She exhibits no edema.  Pain and redness into the joint of the right great toe. onychomycosis under the nail, with thickened nail present. No tibial edema.   Neurological: She is alert.  Skin: Skin is warm and dry.  Several day old laceration over the MCP joint of the right hand of the thumb. Approximately 0.5cm long, 1mm wide, and 1mm deep. No evidence of infection.   Psychiatric: She has a normal mood and affect. Her behavior is normal.  Labs on Admission:  Basic Metabolic Panel:  Recent Labs Lab 10/07/15 2201  NA 131*  K 3.5  CL 103  CO2 22  GLUCOSE 115*  BUN 9  CREATININE 0.78  CALCIUM 7.9*   Liver Function Tests:  Recent Labs Lab 10/07/15 2201  AST 109*  ALT 58*  ALKPHOS 120  BILITOT 3.7*  PROT 7.4  ALBUMIN 2.3*    Recent Labs Lab 10/07/15 2201  LIPASE 26    Recent Labs Lab 10/07/15 2201  AMMONIA 94*   CBC:  Recent Labs Lab 10/07/15 2201  WBC 3.8*  HGB 11.6*  HCT 32.4*  MCV 102.9*  PLT 85*   Cardiac Enzymes: No results for input(s): CKTOTAL, CKMB, CKMBINDEX, TROPONINI in the last 168 hours.  BNP (last 3 results) No results for input(s): PROBNP in the last 8760 hours. CBG: No results for input(s): GLUCAP in the last 168 hours.  Radiological Exams on Admission: Ct Head Wo Contrast  Result Date: 10/08/2015 CLINICAL DATA:  50 y/o F; confusion and dizziness with history of severe liver  disease. EXAM: CT HEAD WITHOUT CONTRAST TECHNIQUE: Contiguous axial images were obtained from the base of the skull through the vertex without intravenous contrast. COMPARISON:  04/23/2001 CT of head report. FINDINGS: Brain: No evidence of acute infarction, hemorrhage, hydrocephalus, extra-axial collection or mass lesion/mass effect. Vascular: No hyperdense vessel or unexpected calcification. Skull: Negative for acute fracture. Left lamina papyracea fracture reported on prior CT, chronic. Sinuses/Orbits: No acute finding. Other: None. IMPRESSION: No acute intracranial abnormality is identified. Unremarkable CT of the head for age. Electronically Signed   By: Mitzi Hansen M.D.   On: 10/08/2015 05:11   US Abdomen Complete  Result Date: 10/08/2015 CLINICAL DATA:  50 y/o F; right upper quadrant pain with nausea, vomiting, diarrhea, and transaminitis. History of cholecystectomy. EXAM: ABDOMEN ULTRASOUND COMPLETE  COMPARISON:  05/28/2015 abdomen ultrasound. FINDINGS: Gallbladder: Resected appear Common bile duct: Diameter: 4.4 mm. Liver: Nodular liver contour and increased echogenicity. Patent portal vein with normal direction of flow. IVC: No abnormality visualized. Pancreas: Visualized portion unremarkable. Spleen: The spleen is enlarged measuring up to approximately 14 cm. Right Kidney: Length: 10.3. Echogenicity within normal limits. No mass or hydronephrosis visualized. Left Kidney: Length: 11.0. Echogenicity within normal limits. No mass or hydronephrosis visualized. Minimal pelviectasis. Abdominal aorta: No aneurysm visualized. Other findings: Small volume of ascites predominantly perihepatic. IMPRESSION: Cirrhotic liver. Mild splenomegaly. Small volume of predominantly perihepatic ascites. Electronically Signed   By: Mitzi HansenLance  Furusawa-Stratton M.D.   On: 10/08/2015 04:18   Ct Abdomen Pelvis W Contrast  Result Date: 10/08/2015 CLINICAL DATA:  50 y/o F; diffuse abdominal pain with nausea vomiting  and diarrhea. History of cholecystectomy and cirrhosis. EXAM: CT ABDOMEN AND PELVIS WITH CONTRAST TECHNIQUE: Multidetector CT imaging of the abdomen and pelvis was performed using the standard protocol following bolus administration of intravenous contrast. CONTRAST:  100 cc Isovue-300. COMPARISON:  10/08/2015 abdominal ultrasound. 05/24/2015 CT of abdomen and pelvis. FINDINGS: Lower chest: No acute abnormality. Hepatobiliary: Cirrhotic liver post cholecystectomy. Patent portal vein. No intra or extrahepatic biliary ductal dilatation. No focal liver lesion identified. Stigmata portal hypertension with portal splenomegaly, recanalization of the umbilical vein, and omental/ mesenteric varices. Pancreas: Unremarkable. No pancreatic ductal dilatation or surrounding inflammatory changes. Spleen: Stable borderline splenomegaly. Adrenals/Urinary Tract: Adrenal glands are unremarkable. Kidneys are normal, without renal calculi, focal lesion, or hydronephrosis. Bladder is unremarkable. Minimal left kidney pelviectasis. Stomach/Bowel: Stomach is within normal limits. Appendix not visualized. No bowel obstruction. Sigmoid diverticulosis without evidence for diverticulitis. There is thickening of the wall of the colon involving the cecum, ascending colon, and transverse colon, increased in comparison with the prior CT of the abdomen and pelvis. Additionally, there is mild diffuse distention of small bowel and thickening of the walls of the small bowel which has increased in comparison with the prior CT of the abdomen and pelvis. No focally dilated loop is identified. Vascular/Lymphatic: Aortic atherosclerosis. No enlarged abdominal or pelvic lymph nodes. Reproductive: Uterus and bilateral adnexa are unremarkable. Other: There is a moderate volume of peritoneal and pelvic ascites which is slightly decreased in comparison with prior CT. Paraumbilical hernia containing ascitic fluid. Musculoskeletal: No acute finding. Degenerative  changes of the spine with a disc bulge at L4-5 and disc space narrowing at L5-S1. Transitional lumbosacral anatomy. IMPRESSION: 1. Interval increase in wall thickening of the colon and small bowel which is mildly dilated diffusely. This is probably due to cirrhotic colopathy/enteropathy due to venous congestion and/or hypoproteinemia. An infectious process is also possible but considered less likely. No evidence for obstruction. 2. Liver cirrhosis. 3. Moderate volume of peritoneal ascites, slightly decreased from prior CT. Electronically Signed   By: Mitzi HansenLance  Furusawa-Stratton M.D.   On: 10/08/2015 07:05   Dg Foot Complete Right  Result Date: 10/07/2015 CLINICAL DATA:  Pain throughout the right foot after injection recently. EXAM: RIGHT FOOT COMPLETE - 3+ VIEW COMPARISON:  None. FINDINGS: Degenerative changes in the intertarsal joints and first metatarsal-phalangeal joint. Probable old fracture deformity of the fifth proximal phalanx. No evidence of acute fracture or dislocation. No focal bone lesion or bone destruction. Soft tissues are unremarkable. IMPRESSION: Degenerative changes in the right foot. No acute bony abnormalities. Electronically Signed   By: Burman NievesWilliam  Stevens M.D.   On: 10/07/2015 22:53   EKG: Independently reviewed.  Assessment/Plan Principal Problem:   Hepatic encephalopathy (  HCC) Active Problems:   Cholelithiasis   HTN (hypertension)   Osteoarthritis   Chronic ankle pain   Tobacco dependence   Hepatic cirrhosis (HCC)   Chronic hepatitis C without hepatic coma (HCC)   Generalized abdominal pain  1. Hepatic encephalopathy - resume lactulose therapy TID, neuro checks, fall precautions and supportive care.  2. Chronic abdominal pain - pt reportedly had cholecystectomy, no acute GI infection seen on CT scan, mild edema noted, has history of colitis but I do not believe she has at this time no GI infection.  3. History of SBP - I don't believe patient has SBP at this time but she  does have history of this and reportedly was supposed to be taking cipro 500 mg daily indefinitely for SBP prophylaxis. She has no fever, no elevated WBC.  Will give 1 dose of ceftriaxone today and resume ciprofloxacin SBP prophylaxis tomorrow.   4. Hepatic cirrhosis - resume spironolactone 100 mg and lasix 40 mg daily as had been recommended by GI, lactulose also ordered as noted above.  5. Chronic hepatitis C - check urine drug screen.  Pt has not followed up with [facility] as had been previously arranged during last admission.  6. OA with chronic ankle pain - morphine ordered, xray done in ED no acute fracture found.  7. Noncompliance with treatment and outpatient follow up - Pt had difficulty with transportation 8. Thrombocytopenia - secondary to chronic liver disease/cirrhosis. Following. 9. Hyponatremia - secondary to volume overload from chronic liver disease, resuming home diuretics as noted above.  10. Tobacco - will offer nicotine patch and counseled at bedside.  Pt verbalized understanding.  11. History of urinary retention - monitor urine output closely, follow strict I/Os.     DVT Prophylaxis: SCD Code Status: full  Time spent: 57 mins  Standley Dakins, MD [facility] Pager [phone]  If 7PM-7AM, please contact night-coverage [email or web address] Password [email or web address] 10/08/2015, 10:17 AM

## 2015-10-08 NOTE — ED Notes (Signed)
Assisted pt to restroom and back to bed steady gait noted back to bed with warm blankets placed on pt alert and oriented x 3 no respiratory or acute distress noted call light in reach.

## 2015-10-08 NOTE — ED Notes (Signed)
Pt requesting something to drink.  Informed will speak to provider.  Pt verbalized understanding.

## 2015-10-08 NOTE — ED Notes (Signed)
No respiratory or acute distress noted alert and oriented x 3 call light in reach no reaction to medication noted. 

## 2015-10-08 NOTE — ED Provider Notes (Signed)
Follow up CT abd did not show any sign of bowel obstruction. Interval increase in wall thickening of the colon and small bowel, likely related to cirrhotic colopathy. Moderate ascites.  Spoke with Dr. Laural BenesJohnson with hospitalist service who will admit pt to Med surg.   Linda KinsmanSamantha Tripp WadleyDowless, PA-C 10/08/15 [id number]    Courteney Randall AnLyn Mackuen, MD 10/09/15 [phone]

## 2015-10-09 DIAGNOSIS — R1084 Generalized abdominal pain: Secondary | ICD-10-CM

## 2015-10-09 DIAGNOSIS — B182 Chronic viral hepatitis C: Secondary | ICD-10-CM | POA: Diagnosis not present

## 2015-10-09 DIAGNOSIS — K7290 Hepatic failure, unspecified without coma: Secondary | ICD-10-CM

## 2015-10-09 DIAGNOSIS — I10 Essential (primary) hypertension: Secondary | ICD-10-CM | POA: Diagnosis not present

## 2015-10-09 LAB — BASIC METABOLIC PANEL
Anion gap: 5 (ref 5–15)
BUN: 8 mg/dL (ref 6–20)
CO2: 23 mmol/L (ref 22–32)
Calcium: 7.7 mg/dL — ABNORMAL LOW (ref 8.9–10.3)
Chloride: 105 mmol/L (ref 101–111)
Creatinine, Ser: 0.85 mg/dL (ref 0.44–1.00)
GFR calc Af Amer: >60 mL/min (ref >60)
GFR calc non Af Amer: >60 mL/min (ref >60)
Glucose, Bld: 102 mg/dL — ABNORMAL HIGH (ref 65–99)
Potassium: 3.5 mmol/L (ref 3.5–5.1)
Sodium: 133 mmol/L — ABNORMAL LOW (ref 135–145)

## 2015-10-09 LAB — CBC
HCT: 32.5 % — ABNORMAL LOW (ref 36.0–46.0)
Hemoglobin: 11.4 g/dL — ABNORMAL LOW (ref 12.0–15.0)
MCH: 36.5 pg — ABNORMAL HIGH (ref 26.0–34.0)
MCHC: 35.1 g/dL (ref 30.0–36.0)
MCV: 104.2 fL — ABNORMAL HIGH (ref 78.0–100.0)
Platelets: 86 10*3/uL — ABNORMAL LOW (ref 150–400)
RBC: 3.12 MIL/uL — ABNORMAL LOW (ref 3.87–5.11)
RDW: 15.7 % — ABNORMAL HIGH (ref 11.5–15.5)
WBC: 4.8 10*3/uL (ref 4.0–10.5)

## 2015-10-09 LAB — PROTIME-INR
INR: 1.52
Prothrombin Time: 18.5 seconds — ABNORMAL HIGH (ref 11.4–15.2)

## 2015-10-09 LAB — AMMONIA: Ammonia: 55 umol/L — ABNORMAL HIGH (ref 9–35)

## 2015-10-09 MED ORDER — LACTULOSE 10 GM/15ML PO SOLN: 10.0000 g | Freq: Two times a day (BID) | ORAL | 0 refills | Status: DC

## 2015-10-09 NOTE — Discharge Summary (Signed)
Physician Discharge Summary  Linda Vaughn:[id number] DOB: 03/15/1965 DOA: 10/07/2015  PCP: [name], FNP  Admit date: 10/07/2015 Discharge date: 10/09/2015  Admitted From: Home Disposition:  Home  Recommendations for Outpatient Follow-up:  1. Follow up with PCP in 1-2 weeks  Discharge Condition:Improved CODE STATUS:Full Diet recommendation: Low sodium   Brief/Interim Summary:  50 y.o. female PMHx of umbilical hernia, polysubstance abuse, and cholelithiasis, who presents to the Emergency Department complaining of gradual onset, gradually worsening diffuse abdominal pain onset the morning of hospital admission associated with increased confusion.  1. Hepatic encephalopathy - Patient was given 1 dose of lactulose in the emergency department with resultant large stool noted. Follow-up ammonia level had decreased from 94-55. Patient's mentation had returned to baseline weekly  2. Chronic abdominal pain - patient remained stable overnight. Seen the next morning, patient denied any abdominal pain and tolerated her diet well  3. History of SBP - patient was given an empiric course of ceftriaxone 1. Patient remained afebrile.   4. Hepatic cirrhosis - patient was continued on spironolactone 100 mg and lasix 40 mg daily as had been recommended by GI, lactulose also continued 5. Chronic hepatitis C - urine drug screen was positive for cocaine and marijuana. Patient vehemently denies using such drugs. Otherwise, patient has been advised to follow-up closely with her gastroenterologist.  6. OA with chronic ankle pain - morphine was ordered, xray was done in ED no acute fracture found.  7. Noncompliance with treatment and outpatient follow up - Pt had difficulty with transportation 8. Thrombocytopenia - secondary to chronic liver disease/cirrhosis. Patient to follow-up as outpatient 9. Hyponatremia - secondary to volume overload from chronic liver disease, patient was continued on her home  diuretics  10. Tobacco abuse - patient states she is actively trying to cut back smoking  11. History of urinary retention - stable this admission 12. Positive drug screens - patient noted to have positive cocaine and positive marijuana on her urine drug screen. Results were similarly positive 5 months prior. When confronted, patient vehemently denies using substances  Discharge Diagnoses:  Principal Problem:   Hepatic encephalopathy (HCC) Active Problems:   Cholelithiasis   HTN (hypertension)   Osteoarthritis   Chronic ankle pain   Tobacco dependence   Hepatic cirrhosis (HCC)   Chronic hepatitis C without hepatic coma (HCC)   Generalized abdominal pain    Discharge Instructions     Medication List    STOP taking these medications   ciprofloxacin 500 MG tablet Commonly known as:  CIPRO   magnesium oxide 400 (241.3 Mg) MG tablet Commonly known as:  MAG-OX   oxyCODONE 5 MG immediate release tablet Commonly known as:  ROXICODONE     TAKE these medications   furosemide 40 MG tablet Commonly known as:  LASIX Take 1 tablet (40 mg total) by mouth daily.   lactulose 10 GM/15ML solution Commonly known as:  CHRONULAC Take 15 mLs (10 g total) by mouth 2 (two) times daily.   spironolactone 100 MG tablet Commonly known as:  ALDACTONE TAKE 1 TABLET (100 MG TOTAL) BY MOUTH DAILY.      Follow-up Information    [name], FNP. Schedule an appointment as soon as possible for a visit in 1 week(s).   Specialty:  Family Medicine Contact information: [address] [place] [address] [phone]          Allergies  Allergen Reactions  . Aspirin Other (See Comments)    Liver damage   .  Ibuprofen Other (See Comments)    Liver damage  . Tylenol [Acetaminophen] Other (See Comments)    Liver damage    Procedures/Studies: Ct Head Wo Contrast  Result Date: 10/08/2015 CLINICAL DATA:  50 y/o F; confusion and dizziness with history of severe liver  disease. EXAM: CT HEAD WITHOUT CONTRAST TECHNIQUE: Contiguous axial images were obtained from the base of the skull through the vertex without intravenous contrast. COMPARISON:  04/23/2001 CT of head report. FINDINGS: Brain: No evidence of acute infarction, hemorrhage, hydrocephalus, extra-axial collection or mass lesion/mass effect. Vascular: No hyperdense vessel or unexpected calcification. Skull: Negative for acute fracture. Left lamina papyracea fracture reported on prior CT, chronic. Sinuses/Orbits: No acute finding. Other: None. IMPRESSION: No acute intracranial abnormality is identified. Unremarkable CT of the head for age. Electronically Signed   By: Mitzi HansenLance  Furusawa-Stratton M.D.   On: 10/08/2015 05:11   [place] Abdomen Complete  Result Date: 10/08/2015 CLINICAL DATA:  50 y/o F; right upper quadrant pain with nausea, vomiting, diarrhea, and transaminitis. History of cholecystectomy. EXAM: ABDOMEN ULTRASOUND COMPLETE COMPARISON:  05/28/2015 abdomen ultrasound. FINDINGS: Gallbladder: Resected appear Common bile duct: Diameter: 4.4 mm. Liver: Nodular liver contour and increased echogenicity. Patent portal vein with normal direction of flow. IVC: No abnormality visualized. Pancreas: Visualized portion unremarkable. Spleen: The spleen is enlarged measuring up to approximately 14 cm. Right Kidney: Length: 10.3. Echogenicity within normal limits. No mass or hydronephrosis visualized. Left Kidney: Length: 11.0. Echogenicity within normal limits. No mass or hydronephrosis visualized. Minimal pelviectasis. Abdominal aorta: No aneurysm visualized. Other findings: Small volume of ascites predominantly perihepatic. IMPRESSION: Cirrhotic liver. Mild splenomegaly. Small volume of predominantly perihepatic ascites. Electronically Signed   By: Mitzi HansenLance  Furusawa-Stratton M.D.   On: 10/08/2015 04:18   Ct Abdomen Pelvis W Contrast  Result Date: 10/08/2015 CLINICAL DATA:  50 y/o F; diffuse abdominal pain with nausea vomiting  and diarrhea. History of cholecystectomy and cirrhosis. EXAM: CT ABDOMEN AND PELVIS WITH CONTRAST TECHNIQUE: Multidetector CT imaging of the abdomen and pelvis was performed using the standard protocol following bolus administration of intravenous contrast. CONTRAST:  100 cc Isovue-300. COMPARISON:  10/08/2015 abdominal ultrasound. 05/24/2015 CT of abdomen and pelvis. FINDINGS: Lower chest: No acute abnormality. Hepatobiliary: Cirrhotic liver post cholecystectomy. Patent portal vein. No intra or extrahepatic biliary ductal dilatation. No focal liver lesion identified. Stigmata portal hypertension with portal splenomegaly, recanalization of the umbilical vein, and omental/ mesenteric varices. Pancreas: Unremarkable. No pancreatic ductal dilatation or surrounding inflammatory changes. Spleen: Stable borderline splenomegaly. Adrenals/Urinary Tract: Adrenal glands are unremarkable. Kidneys are normal, without renal calculi, focal lesion, or hydronephrosis. Bladder is unremarkable. Minimal left kidney pelviectasis. Stomach/Bowel: Stomach is within normal limits. Appendix not visualized. No bowel obstruction. Sigmoid diverticulosis without evidence for diverticulitis. There is thickening of the wall of the colon involving the cecum, ascending colon, and transverse colon, increased in comparison with the prior CT of the abdomen and pelvis. Additionally, there is mild diffuse distention of small bowel and thickening of the walls of the small bowel which has increased in comparison with the prior CT of the abdomen and pelvis. No focally dilated loop is identified. Vascular/Lymphatic: Aortic atherosclerosis. No enlarged abdominal or pelvic lymph nodes. Reproductive: Uterus and bilateral adnexa are unremarkable. Other: There is a moderate volume of peritoneal and pelvic ascites which is slightly decreased in comparison with prior CT. Paraumbilical hernia containing ascitic fluid. Musculoskeletal: No acute finding. Degenerative  changes of the spine with a disc bulge at L4-5 and disc space narrowing at L5-S1. Transitional lumbosacral  anatomy. IMPRESSION: 1. Interval increase in wall thickening of the colon and small bowel which is mildly dilated diffusely. This is probably due to cirrhotic colopathy/enteropathy due to venous congestion and/or hypoproteinemia. An infectious process is also possible but considered less likely. No evidence for obstruction. 2. Liver cirrhosis. 3. Moderate volume of peritoneal ascites, slightly decreased from prior CT. Electronically Signed   By: Mitzi Hansen M.D.   On: 10/08/2015 07:05   Dg Foot Complete Right  Result Date: 10/07/2015 CLINICAL DATA:  Pain throughout the right foot after injection recently. EXAM: RIGHT FOOT COMPLETE - 3+ VIEW COMPARISON:  None. FINDINGS: Degenerative changes in the intertarsal joints and first metatarsal-phalangeal joint. Probable old fracture deformity of the fifth proximal phalanx. No evidence of acute fracture or dislocation. No focal bone lesion or bone destruction. Soft tissues are unremarkable. IMPRESSION: Degenerative changes in the right foot. No acute bony abnormalities. Electronically Signed   By: Burman Nieves M.D.   On: 10/07/2015 22:53     Subjective: Feels much better today. Eager to go home  Discharge Exam: Vitals:   10/08/15 2104 10/09/15 0450  BP: 127/81 110/72  Pulse: 73 60  Resp: 18 18  Temp: 98.9 F (37.2 C) 98.7 F (37.1 C)   Vitals:   10/08/15 0929 10/08/15 1505 10/08/15 2104 10/09/15 0450  BP: (!) 107/58 129/65 127/81 110/72  Pulse: 74 73 73 60  Resp: 18 18 18 18   Temp: 99.2 F (37.3 C)  98.9 F (37.2 C) 98.7 F (37.1 C)  TempSrc: Oral  Oral Oral  SpO2: 98% 99% 99% 98%  Weight: 79.6 kg (175 lb 7.8 oz)     Height: 5\' 8"  (1.727 m)       General: Pt is alert, awake, not in acute distress Cardiovascular: RRR, S1/S2 +, no rubs, no gallops Respiratory: CTA bilaterally, no wheezing, no rhonchi Abdominal: Soft,  NT, ND, bowel sounds + Extremities: no edema, no cyanosis   The results of significant diagnostics from this hospitalization (including imaging, microbiology, ancillary and laboratory) are listed below for reference.     Microbiology: No results found for this or any previous visit (from the past 240 hour(s)).   Labs: BNP (last 3 results) No results for input(s): BNP in the last 8760 hours. Basic Metabolic Panel:  Recent Labs Lab 10/07/15 2201 10/09/15 0527  NA 131* 133*  K 3.5 3.5  CL 103 105  CO2 22 23  GLUCOSE 115* 102*  BUN 9 8  CREATININE 0.78 0.85  CALCIUM 7.9* 7.7*   Liver Function Tests:  Recent Labs Lab 10/07/15 2201  AST 109*  ALT 58*  ALKPHOS 120  BILITOT 3.7*  PROT 7.4  ALBUMIN 2.3*    Recent Labs Lab 10/07/15 2201  LIPASE 26    Recent Labs Lab 10/07/15 2201 10/09/15 0624  AMMONIA 94* 55*   CBC:  Recent Labs Lab 10/07/15 2201 10/09/15 0527  WBC 3.8* 4.8  HGB 11.6* 11.4*  HCT 32.4* 32.5*  MCV 102.9* 104.2*  PLT 85* 86*   Cardiac Enzymes: No results for input(s): CKTOTAL, CKMB, CKMBINDEX, TROPONINI in the last 168 hours. BNP: Invalid input(s): POCBNP CBG: No results for input(s): GLUCAP in the last 168 hours. D-Dimer No results for input(s): DDIMER in the last 72 hours. Hgb A1c No results for input(s): HGBA1C in the last 72 hours. Lipid Profile No results for input(s): CHOL, HDL, LDLCALC, TRIG, CHOLHDL, LDLDIRECT in the last 72 hours. Thyroid function studies No results for input(s): TSH, T4TOTAL, T3FREE, THYROIDAB  in the last 72 hours.  Invalid input(s): FREET3 Anemia work up No results for input(s): VITAMINB12, FOLATE, FERRITIN, TIBC, IRON, RETICCTPCT in the last 72 hours. Urinalysis    Component Value Date/Time   COLORURINE YELLOW 10/07/2015 0017   APPEARANCEUR CLEAR 10/07/2015 0017   LABSPEC 1.007 10/07/2015 0017   PHURINE 7.0 10/07/2015 0017   GLUCOSEU NEGATIVE 10/07/2015 0017   HGBUR NEGATIVE 10/07/2015 0017    BILIRUBINUR NEGATIVE 10/07/2015 0017   KETONESUR NEGATIVE 10/07/2015 0017   PROTEINUR NEGATIVE 10/07/2015 0017   UROBILINOGEN >=8.0 01/25/2015 1524   NITRITE NEGATIVE 10/07/2015 0017   LEUKOCYTESUR NEGATIVE 10/07/2015 0017   Sepsis Labs Invalid input(s): PROCALCITONIN,  WBC,  LACTICIDVEN Microbiology No results found for this or any previous visit (from the past 240 hour(s)).   SIGNED:   Jerald Kief, MD  [facility] 10/09/2015, 9:51 AM  If 7PM-7AM, please contact night-coverage [email or web address] Password [email or web address]

## 2015-10-14 ENCOUNTER — Other Ambulatory Visit: Payer: Self-pay | Admitting: Gastroenterology

## 2015-10-15 NOTE — Telephone Encounter (Signed)
Refill request. Pt has cancelled last 5 appt and no showed the last one. Please advise.

## 2015-11-05 ENCOUNTER — Telehealth: Payer: Self-pay | Admitting: Gastroenterology

## 2015-11-06 NOTE — Telephone Encounter (Signed)
Please see prior encounters This patient would not come to multiple office visits and I said therefore I would not refill any meds

## 2015-11-07 NOTE — Telephone Encounter (Signed)
Pt notified and aware, she hung up the phone before I could give her the whole explanation on why her refill request was denied.

## 2015-11-07 NOTE — Telephone Encounter (Signed)
Please prepare a letter to discharge patient from the practice due to noncompliance.

## 2015-11-07 NOTE — Telephone Encounter (Signed)
Information on your desk to proceed.

## 2015-11-19 ENCOUNTER — Telehealth: Payer: Self-pay | Admitting: Gastroenterology

## 2015-11-19 NOTE — Telephone Encounter (Signed)
Patient dismissed from [facility] by Amada JupiterHenry Danis III , effective November 15, 2015. Dismissal letter sent out by certified / registered mail. DAJ

## 2015-12-17 ENCOUNTER — Other Ambulatory Visit: Payer: Self-pay | Admitting: Family Medicine

## 2015-12-26 NOTE — Telephone Encounter (Signed)
Certified dismissal letter returned as undeliverable, unclaimed, return to sender after three attempts by [employer] on December 26, 2015 Letter placed in another envelope and resent as 1st class mail which does not require a signature. DAJ

## 2015-12-26 NOTE — Telephone Encounter (Signed)
Thanks for letting me know.  If it is returned after this mailing, then we have done all we can do.

## 2016-01-04 ENCOUNTER — Other Ambulatory Visit: Payer: Self-pay | Admitting: Family Medicine

## 2016-01-20 ENCOUNTER — Other Ambulatory Visit: Payer: Self-pay | Admitting: Family Medicine

## 2016-01-31 ENCOUNTER — Ambulatory Visit: Payer: Medicaid Other | Admitting: Family Medicine

## 2016-02-01 ENCOUNTER — Encounter (HOSPITAL_COMMUNITY): Payer: Self-pay | Admitting: *Deleted

## 2016-02-01 ENCOUNTER — Emergency Department (HOSPITAL_COMMUNITY)
Admission: EM | Admit: 2016-02-01 | Discharge: 2016-02-02 | Disposition: A | Discharge status: Home / Self Care | Payer: Medicaid Other | Attending: Emergency Medicine | Admitting: Emergency Medicine

## 2016-02-01 DIAGNOSIS — I10 Essential (primary) hypertension: Secondary | ICD-10-CM | POA: Diagnosis not present

## 2016-02-01 DIAGNOSIS — R1033 Periumbilical pain: Secondary | ICD-10-CM

## 2016-02-01 DIAGNOSIS — K429 Umbilical hernia without obstruction or gangrene: Secondary | ICD-10-CM | POA: Insufficient documentation

## 2016-02-01 DIAGNOSIS — F17200 Nicotine dependence, unspecified, uncomplicated: Secondary | ICD-10-CM | POA: Diagnosis not present

## 2016-02-01 DIAGNOSIS — R188 Other ascites: Secondary | ICD-10-CM | POA: Insufficient documentation

## 2016-02-01 LAB — COMPREHENSIVE METABOLIC PANEL
ALK PHOS: 165 U/L — AB (ref 38–126)
ALT: 54 U/L (ref 14–54)
ANION GAP: 5 (ref 5–15)
AST: 101 U/L — ABNORMAL HIGH (ref 15–41)
Albumin: 2.4 g/dL — ABNORMAL LOW (ref 3.5–5.0)
BILIRUBIN TOTAL: 2.6 mg/dL — AB (ref 0.3–1.2)
BUN: 10 mg/dL (ref 6–20)
CALCIUM: 8.5 mg/dL — AB (ref 8.9–10.3)
CO2: 23 mmol/L (ref 22–32)
Chloride: 107 mmol/L (ref 101–111)
Creatinine, Ser: 0.76 mg/dL (ref 0.44–1.00)
GFR calc Af Amer: >60 mL/min (ref >60)
Glucose, Bld: 120 mg/dL — ABNORMAL HIGH (ref 65–99)
POTASSIUM: 3.7 mmol/L (ref 3.5–5.1)
Sodium: 135 mmol/L (ref 135–145)
TOTAL PROTEIN: 8.1 g/dL (ref 6.5–8.1)

## 2016-02-01 LAB — CBC
HEMATOCRIT: 36 % (ref 36.0–46.0)
HEMOGLOBIN: 13 g/dL (ref 12.0–15.0)
MCH: 36.8 pg — ABNORMAL HIGH (ref 26.0–34.0)
MCHC: 36.1 g/dL — AB (ref 30.0–36.0)
MCV: 102 fL — ABNORMAL HIGH (ref 78.0–100.0)
Platelets: 123 10*3/uL — ABNORMAL LOW (ref 150–400)
RBC: 3.53 MIL/uL — ABNORMAL LOW (ref 3.87–5.11)
RDW: 15.1 % (ref 11.5–15.5)
WBC: 7.1 10*3/uL (ref 4.0–10.5)

## 2016-02-01 LAB — LIPASE, BLOOD: Lipase: 29 U/L (ref 11–51)

## 2016-02-01 MED ORDER — FENTANYL CITRATE (PF) 100 MCG/2ML IJ SOLN
100.0000 ug | Freq: Once | INTRAMUSCULAR | Status: AC
  Administered 2016-02-01: 100 ug via INTRAVENOUS
  Filled 2016-02-01: qty 2

## 2016-02-01 NOTE — ED Triage Notes (Signed)
Pt reports she has umbilical hernia and it started to bother her today.  Pt reports vomiting but denies any diarrhea at this time.

## 2016-02-01 NOTE — ED Notes (Signed)
No answer

## 2016-02-02 ENCOUNTER — Encounter (HOSPITAL_COMMUNITY): Payer: Self-pay | Admitting: Radiology

## 2016-02-02 ENCOUNTER — Emergency Department (HOSPITAL_COMMUNITY): Payer: Medicaid Other

## 2016-02-02 LAB — I-STAT CG4 LACTIC ACID, ED: Lactic Acid, Venous: 1.21 mmol/L (ref 0.5–1.9)

## 2016-02-02 MED ORDER — HYDROMORPHONE HCL 1 MG/ML IJ SOLN
1.0000 mg | Freq: Once | INTRAMUSCULAR | Status: AC
  Administered 2016-02-02: 1 mg via INTRAVENOUS
  Filled 2016-02-02: qty 1

## 2016-02-02 MED ORDER — IOPAMIDOL (ISOVUE-300) INJECTION 61%
INTRAVENOUS | Status: AC
  Filled 2016-02-02: qty 100

## 2016-02-02 MED ORDER — ONDANSETRON 4 MG PO TBDP: 4.0000 mg | ORAL_TABLET | Freq: Three times a day (TID) | ORAL | 0 refills | Status: DC | PRN

## 2016-02-02 MED ORDER — IOPAMIDOL (ISOVUE-300) INJECTION 61%
15.0000 mL | Freq: Once | INTRAVENOUS | Status: AC | PRN
  Administered 2016-02-02: 15 mL via ORAL

## 2016-02-02 MED ORDER — IOPAMIDOL (ISOVUE-300) INJECTION 61%
100.0000 mL | Freq: Once | INTRAVENOUS | Status: AC | PRN
  Administered 2016-02-02: 100 mL via INTRAVENOUS

## 2016-02-02 MED ORDER — ONDANSETRON HCL 4 MG/2ML IJ SOLN
4.0000 mg | Freq: Once | INTRAMUSCULAR | Status: AC
  Administered 2016-02-02: 4 mg via INTRAVENOUS
  Filled 2016-02-02: qty 2

## 2016-02-02 MED ORDER — OXYCODONE HCL 5 MG PO CAPS: 5.0000 mg | ORAL_CAPSULE | ORAL | 0 refills | Status: DC | PRN

## 2016-02-02 NOTE — ED Notes (Signed)
Pt requesting more pain meds; PA made aware

## 2016-02-02 NOTE — ED Notes (Signed)
Pt in CT.

## 2016-02-02 NOTE — ED Notes (Signed)
Pt requesting something to drink; explained to pt that we can't give her anything to drink until all tests have resulted

## 2016-02-02 NOTE — ED Notes (Signed)
PA at bedside.

## 2016-02-02 NOTE — ED Notes (Signed)
Pt moaning that "I'm so thirsty I'm about to die. Man, this ain't right"; this nurse again explained reason behind NPO status; pt was offered swabs to wet mouth but pt continued to moan, stating "Swabs ain't gonna do nothing"

## 2016-02-02 NOTE — Discharge Instructions (Signed)
Please read and follow all provided instructions.  Your diagnoses today include:  1. Periumbilical hernia   2. Periumbilical abdominal pain   3. Other ascites     Tests performed today include:  Blood counts and electrolytes  Blood tests to check liver and kidney function  Blood tests to check pancreas function  Urine test to look for infection  CT of your abdomen and pelvis - shows fat containing hernia and ascites  Vital signs. See below for your results today.   Medications prescribed:   Oxycodone - narcotic pain medication  DO NOT drive or perform any activities that require you to be awake and alert because this medicine can make you drowsy.    Zofran (ondansetron) - for nausea and vomiting  Take any prescribed medications only as directed.  Home care instructions:   Follow any educational materials contained in this packet.  Follow-up instructions: Please follow-up with your primary care provider in the next 3 days for further evaluation of your symptoms.    Return instructions:  SEEK IMMEDIATE MEDICAL ATTENTION IF:  The pain does not go away or becomes severe   A temperature above 101F develops   Repeated vomiting occurs (multiple episodes)   The pain becomes localized to portions of the abdomen. The right side could possibly be appendicitis. In an adult, the left lower portion of the abdomen could be colitis or diverticulitis.   Blood is being passed in stools or vomit (bright red or black tarry stools)   You develop chest pain, difficulty breathing, dizziness or fainting, or become confused, poorly responsive, or inconsolable (young children)  If you have any other emergent concerns regarding your health  Additional Information: Abdominal (belly) pain can be caused by many things. Your caregiver performed an examination and possibly ordered blood/urine tests and imaging (CT scan, x-rays, ultrasound). Many cases can be observed and treated at home  after initial evaluation in the emergency department. Even though you are being discharged home, abdominal pain can be unpredictable. Therefore, you need a repeated exam if your pain does not resolve, returns, or worsens. Most patients with abdominal pain don't have to be admitted to the hospital or have surgery, but serious problems like appendicitis and gallbladder attacks can start out as nonspecific pain. Many abdominal conditions cannot be diagnosed in one visit, so follow-up evaluations are very important.  Your vital signs today were: BP 116/88    Pulse 77    Temp 99.4 F (37.4 C) (Oral)    Resp 16    SpO2 97%  If your blood pressure (bp) was elevated above 135/85 this visit, please have this repeated by your doctor within one month. --------------

## 2016-02-02 NOTE — ED Notes (Signed)
Unsuccessful IV attempt by this nurse.

## 2016-02-02 NOTE — ED Provider Notes (Signed)
[facility]-EMERGENCY DEPT Provider Note   CSN: [id number] Arrival date & time: 02/01/16  1849  By signing my name below, I, Linna DarnerRussell Turner, attest that this documentation has been prepared under the direction and in the presence of Wal-MartJosh Fey Coghill, PA-C. Electronically Signed: Linna Darnerussell Turner, Scribe. 02/02/2016. 12:26 AM.  History   Chief Complaint Chief Complaint  Patient presents with  . Abdominal Pain    The history is provided by the patient. No language interpreter was used.     HPI Comments: Linda Vaughn is a 51 y.o. female with PMHx significant for gallstones, cholelithiasis, SBP, and cirrhosis who presents to the Emergency Department via EMS complaining of constant, severe periumbilical abdominal pain beginning on the afternoon of 1/26. She reports she has an umbilical hernia but states her abdomen only started hurting yesterday. Pt reports associated subjective fever/chills, nausea, and vomiting. She has not taken her temperature. She notes she has had abdominal distention for about one month and it worsened today in association with her abdominal pain. Pt notes a h/o paracentesis. She denies diarrhea or any other associated symptoms.  Past Medical History:  Diagnosis Date  . Arthritis   . Cirrhosis of liver (HCC)   . Gallstones   . Hepatitis C   . Hypertension     Patient Active Problem List   Diagnosis Date Noted  . Hepatic encephalopathy (HCC) 10/08/2015  . Generalized abdominal pain 10/08/2015  . Sepsis (HCC)   . SBP (spontaneous bacterial peritonitis) (HCC)   . Hepatic cirrhosis (HCC)   . Chronic hepatitis C without hepatic coma (HCC)   . Polysubstance abuse   . Ascites 05/24/2015  . Tobacco dependence 04/04/2015  . Umbilical hernia without obstruction and without gangrene 04/04/2015  . Chronic ankle pain 01/27/2015  . Obesity 01/27/2015  . Depression 01/27/2015  . Osteoarthritis 01/25/2015  . Cholelithiasis 04/20/2012  . HTN (hypertension) 04/20/2012  . Obesity  (BMI 30.0-34.9) 04/20/2012    Past Surgical History:  Procedure Laterality Date  . ANKLE SURGERY    . ANKLE SURGERY    . CARPAL TUNNEL RELEASE    . CESAREAN SECTION    . CHOLECYSTECTOMY      OB History    No data available       Home Medications    Prior to Admission medications   Medication Sig Start Date End Date Taking? Authorizing Provider  furosemide (LASIX) 40 MG tablet Take 1 tablet (40 mg total) by mouth daily. 06/11/15   Charlie PitterHenry L Danis III, MD  lactulose (CHRONULAC) 10 GM/15ML solution TAKE 15 MLS TWICE A DAY 12/17/15   Massie MaroonLachina M Hollis, FNP  spironolactone (ALDACTONE) 100 MG tablet TAKE 1 TABLET (100 MG TOTAL) BY MOUTH DAILY. 12/17/15   Massie MaroonLachina M Hollis, FNP    Family History Family History  Problem Relation Age of Onset  . Cancer Mother   . Rheum arthritis Father   . Diabetes Other     Social History Social History  Substance Use Topics  . Smoking status: Current Every Day Smoker    Packs/day: 0.50  . Smokeless tobacco: Never Used  . Alcohol use No     Comment: occ     Allergies   Aspirin; Ibuprofen; and Tylenol [acetaminophen]   Review of Systems Review of Systems  Constitutional: Positive for chills and fever.  HENT: Negative for rhinorrhea and sore throat.   Eyes: Negative for redness.  Respiratory: Negative for cough.   Cardiovascular: Negative for chest pain.  Gastrointestinal: Positive for abdominal distention,  abdominal pain, nausea and vomiting. Negative for blood in stool and diarrhea.  Genitourinary: Negative for dysuria.  Musculoskeletal: Negative for myalgias.  Skin: Negative for rash.  Neurological: Negative for headaches.     Physical Exam Updated Vital Signs BP 136/83 (BP Location: Left Wrist)   Pulse 74   Temp 99.4 F (37.4 C) (Oral)   Resp 18   SpO2 96%   Physical Exam  Constitutional: She appears well-developed and well-nourished. No distress.  HENT:  Head: Normocephalic and atraumatic.  Eyes: Conjunctivae and EOM  are normal.  Neck: Neck supple. No tracheal deviation present.  Cardiovascular: Normal rate.   Pulmonary/Chest: Effort normal. No respiratory distress. She has no wheezes. She has no rales.  Abdominal: She exhibits no mass. There is tenderness (focally around umbilical hernia). There is no guarding. A hernia is present.  Patient with soft periumbilical hernia that is focally tender. Hernia is able to be easily reduced.  Musculoskeletal: Normal range of motion.  Neurological: She is alert.  Skin: Skin is warm and dry.  Psychiatric: She has a normal mood and affect. Her behavior is normal.  Nursing note and vitals reviewed.    ED Treatments / Results  Labs (all labs ordered are listed, but only abnormal results are displayed) Labs Reviewed  COMPREHENSIVE METABOLIC PANEL - Abnormal; Notable for the following:       Result Value   Glucose, Bld 120 (*)    Calcium 8.5 (*)    Albumin 2.4 (*)    AST 101 (*)    Alkaline Phosphatase 165 (*)    Total Bilirubin 2.6 (*)    All other components within normal limits  CBC - Abnormal; Notable for the following:    RBC 3.53 (*)    MCV 102.0 (*)    MCH 36.8 (*)    MCHC 36.1 (*)    Platelets 123 (*)    All other components within normal limits  LIPASE, BLOOD  URINALYSIS, ROUTINE W REFLEX MICROSCOPIC  I-STAT CG4 LACTIC ACID, ED   Results for orders placed or performed during the hospital encounter of 02/01/16  Lipase, blood  Result Value Ref Range   Lipase 29 11 - 51 U/L  Comprehensive metabolic panel  Result Value Ref Range   Sodium 135 135 - 145 mmol/L   Potassium 3.7 3.5 - 5.1 mmol/L   Chloride 107 101 - 111 mmol/L   CO2 23 22 - 32 mmol/L   Glucose, Bld 120 (H) 65 - 99 mg/dL   BUN 10 6 - 20 mg/dL   Creatinine, Ser 1.61 0.44 - 1.00 mg/dL   Calcium 8.5 (L) 8.9 - 10.3 mg/dL   Total Protein 8.1 6.5 - 8.1 g/dL   Albumin 2.4 (L) 3.5 - 5.0 g/dL   AST 096 (H) 15 - 41 U/L   ALT 54 14 - 54 U/L   Alkaline Phosphatase 165 (H) 38 - 126 U/L     Total Bilirubin 2.6 (H) 0.3 - 1.2 mg/dL   GFR calc non Af Amer >60 >60 mL/min   GFR calc Af Amer >60 >60 mL/min   Anion gap 5 5 - 15  CBC  Result Value Ref Range   WBC 7.1 4.0 - 10.5 K/uL   RBC 3.53 (L) 3.87 - 5.11 MIL/uL   Hemoglobin 13.0 12.0 - 15.0 g/dL   HCT 04.5 40.9 - 81.1 %   MCV 102.0 (H) 78.0 - 100.0 fL   MCH 36.8 (H) 26.0 - 34.0 pg   MCHC 36.1 (  H) 30.0 - 36.0 g/dL   RDW 16.1 09.6 - 04.5 %   Platelets 123 (L) 150 - 400 K/uL  I-Stat CG4 Lactic Acid, ED  Result Value Ref Range   Lactic Acid, Venous 1.21 0.5 - 1.9 mmol/L   Ct Abdomen Pelvis W Contrast  Result Date: 02/02/2016 CLINICAL DATA:  Initial evaluation for acute abdominal pain at the umbilicus. EXAM: CT ABDOMEN AND PELVIS WITH CONTRAST TECHNIQUE: Multidetector CT imaging of the abdomen and pelvis was performed using the standard protocol following bolus administration of intravenous contrast. CONTRAST:  15mL ISOVUE-300 IOPAMIDOL (ISOVUE-300) INJECTION 61%, ISOVUE-300 IOPAMIDOL (ISOVUE-300) INJECTION 61% COMPARISON:  Prior CT from 10/08/2015. FINDINGS: Lower chest: Mild bibasilar atelectatic changes. Visualized lung bases are otherwise clear. Hepatobiliary: Changes related to cirrhosis seen within the liver. No focal intrahepatic mass recannulized paraumbilical vein present. Multiple varices present within the abdomen, consistent with portal hypertension. Gallbladder surgically absent. No biliary dilatation. Pancreas: Pancreas within normal limits without definite inflammatory changes. Spleen: Spleen within normal limits.  No splenomegaly. Adrenals/Urinary Tract: Adrenal glands are normal. Kidneys equal in size with symmetric enhancement. No nephrolithiasis, hydronephrosis, or focal enhancing renal mass. No hydroureter. Bladder within normal limits. Stomach/Bowel: Stomach within normal limits. No evidence for bowel obstruction. No definite acute inflammatory changes identified about the bowels, although evaluation somewhat  limited given presence of ascites and mucosal edema throughout the abdomen. Mild wall thickening about the proximal colon, suspected to be related to underlying cirrhosis, similar to previous. Colonic diverticulosis noted. Vascular/Lymphatic: Normal intravascular enhancement seen throughout the intra-abdominal aorta and its branch vessels. Scattered porta hepatis lymph nodes measure up to 11 mm, likely related underlying intrinsic liver disease. No other pathologically enlarged lymph nodes identified. Reproductive: Uterus and ovaries within normal limits. Other: Loculated fluid present within the subcutaneous fat within the periumbilical region. This measures approximately 3.5 x 6.0 x 8.1 cm. Overall, this is increased in size from previous. Moderate volume ascites, also increased from prior. No free intraperitoneal air. Musculoskeletal: No acute osseous abnormality. No worrisome lytic or blastic osseous lesions. Anasarca noted. IMPRESSION: 1. Hepatic cirrhosis with evidence for portal hypertension. Moderate volume ascites has increased relative to most recent CT. 2. Increased size of loculated fluid within a multiloculated paraumbilical hernia as above. 3. Diffuse wall thickening about the proximal colon, felt to most likely be related to underlying cirrhosis/hypoproteinemia. Possible infectious or inflammatory colitis is not entirely excluded, and could be considered in the correct clinical setting. Electronically Signed   By: Rise Mu M.D.   On: 02/02/2016 04:34     Procedures Procedures (including critical care time)  DIAGNOSTIC STUDIES: Oxygen Saturation is 96% on RA, adequate by my interpretation.    COORDINATION OF CARE: 12:32 AM Will administer Dilaudid and Zofran. Will order lab work. Discussed treatment plan with pt at bedside and pt agreed to plan.  Medications Ordered in ED Medications  iopamidol (ISOVUE-300) 61 % injection (not administered)  fentaNYL (SUBLIMAZE) injection  100 mcg (100 mcg Intravenous Given 02/01/16 2211)  HYDROmorphone (DILAUDID) injection 1 mg (1 mg Intravenous Given 02/02/16 0052)  ondansetron (ZOFRAN) injection 4 mg (4 mg Intravenous Given 02/02/16 0052)  HYDROmorphone (DILAUDID) injection 1 mg (1 mg Intravenous Given 02/02/16 0324)  iopamidol (ISOVUE-300) 61 % injection 15 mL (15 mLs Oral Contrast Given 02/02/16 0314)  iopamidol (ISOVUE-300) 61 % injection 100 mL (100 mLs Intravenous Contrast Given 02/02/16 0349)     Initial Impression / Assessment and Plan / ED Course  I have reviewed the triage  vital signs and the nursing notes.  Pertinent labs & imaging results that were available during my care of the patient were reviewed by me and considered in my medical decision making (see chart for details).     Patient discussed with Dr. Read Drivers who has seen. CT abd/pelvis ordered. Agrees low concern for SBP given exam.   Symptoms controlled in emergency department. Patient is tearful stating that she wants to have her hernia fixed because "it makes me look deformed". I discussed that any surgical repair would have to be preceded by an extensive outpatient medical workup given her multiple comorbidities. We discussed that she has no signs of strangulation or incarceration today which would require inpatient admission. We discussed that she has no signs suspicious for spontaneous bacterial peritonitis. Will discharge to home with pain control.  The patient was urged to return to the Emergency Department immediately with worsening of current symptoms, worsening abdominal pain, persistent vomiting, blood noted in stools, fever, or any other concerns. The patient verbalized understanding.   Patient counseled on use of narcotic pain medications. Counseled not to combine these medications with others containing tylenol. Urged not to drink alcohol, drive, or perform any other activities that requires focus while taking these medications. The patient verbalizes  understanding and agrees with the plan.   Final Clinical Impressions(s) / ED Diagnoses   Final diagnoses:  Periumbilical hernia  Periumbilical abdominal pain  Other ascites   Patient with discomfort due to and focally around her umbilical hernia. Patient does not have other generalized abdominal pain. She has a history of spontaneous bacterial peritonitis, but does not have documented fever, leukocytosis, increasing ascites. Low concern for this given her exam and the fact that she is tender over her hernia. Hernia is easily reducible and soft. CT imaging does not show any acute complications from this.   No dangerous or life-threatening conditions suspected or identified by history, physical exam, and by work-up. No indications for hospitalization identified.    New Prescriptions Discharge Medication List as of 02/02/2016  5:47 AM    START taking these medications   Details  ondansetron (ZOFRAN ODT) 4 MG disintegrating tablet Take 1 tablet (4 mg total) by mouth every 8 (eight) hours as needed for nausea or vomiting., Starting Sat 02/02/2016, Print    oxycodone (OXY-IR) 5 MG capsule Take 1 capsule (5 mg total) by mouth every 4 (four) hours as needed., Starting Sat 02/02/2016, Print       I personally performed the services described in this documentation, which was scribed in my presence. The recorded information has been reviewed and is accurate.    Renne Crigler, PA-C 02/02/16 1610    Paula Libra, MD 02/02/16 [phone]

## 2016-02-02 NOTE — ED Notes (Signed)
MD at bedside. 

## 2016-02-04 ENCOUNTER — Other Ambulatory Visit: Payer: Self-pay | Admitting: Family Medicine

## 2016-02-24 ENCOUNTER — Encounter (HOSPITAL_COMMUNITY): Payer: Self-pay | Admitting: Emergency Medicine

## 2016-02-24 DIAGNOSIS — L02416 Cutaneous abscess of left lower limb: Secondary | ICD-10-CM | POA: Diagnosis not present

## 2016-02-24 DIAGNOSIS — I10 Essential (primary) hypertension: Secondary | ICD-10-CM | POA: Insufficient documentation

## 2016-02-24 DIAGNOSIS — F17200 Nicotine dependence, unspecified, uncomplicated: Secondary | ICD-10-CM | POA: Diagnosis not present

## 2016-02-24 DIAGNOSIS — R1084 Generalized abdominal pain: Secondary | ICD-10-CM | POA: Insufficient documentation

## 2016-02-24 DIAGNOSIS — Z79899 Other long term (current) drug therapy: Secondary | ICD-10-CM | POA: Diagnosis not present

## 2016-02-24 NOTE — ED Triage Notes (Addendum)
Pt c/o hernia pain, abscess to left shin, and bilateral foot tingling; all complaints have been present for about a year; only new development is that the abscess is now bleeding; pt gave multiple reasons for not seeing her PCP including that she didn't feel the need, didn't have the money, had too much going on; pt now states she has appointment on 2/23

## 2016-02-25 ENCOUNTER — Emergency Department (HOSPITAL_COMMUNITY): Payer: Medicaid Other

## 2016-02-25 ENCOUNTER — Emergency Department (HOSPITAL_COMMUNITY)
Admission: EM | Admit: 2016-02-25 | Discharge: 2016-02-25 | Disposition: A | Discharge status: Home / Self Care | Payer: Medicaid Other | Attending: Emergency Medicine | Admitting: Emergency Medicine

## 2016-02-25 ENCOUNTER — Encounter (HOSPITAL_COMMUNITY): Payer: Self-pay

## 2016-02-25 DIAGNOSIS — L0291 Cutaneous abscess, unspecified: Secondary | ICD-10-CM

## 2016-02-25 DIAGNOSIS — R1084 Generalized abdominal pain: Secondary | ICD-10-CM

## 2016-02-25 LAB — CBC WITH DIFFERENTIAL/PLATELET
BASOS PCT: 0 %
Basophils Absolute: 0 10*3/uL (ref 0.0–0.1)
Eosinophils Absolute: 0.1 10*3/uL (ref 0.0–0.7)
Eosinophils Relative: 2 %
HCT: 32.3 % — ABNORMAL LOW (ref 36.0–46.0)
HEMOGLOBIN: 11.8 g/dL — AB (ref 12.0–15.0)
LYMPHS ABS: 1.8 10*3/uL (ref 0.7–4.0)
Lymphocytes Relative: 38 %
MCH: 37 pg — ABNORMAL HIGH (ref 26.0–34.0)
MCHC: 36.5 g/dL — AB (ref 30.0–36.0)
MCV: 101.3 fL — AB (ref 78.0–100.0)
MONO ABS: 1.1 10*3/uL — AB (ref 0.1–1.0)
MONOS PCT: 23 %
NEUTROS ABS: 1.8 10*3/uL (ref 1.7–7.7)
NEUTROS PCT: 37 %
Platelets: 122 10*3/uL — ABNORMAL LOW (ref 150–400)
RBC: 3.19 MIL/uL — ABNORMAL LOW (ref 3.87–5.11)
RDW: 15.6 % — AB (ref 11.5–15.5)
WBC: 4.9 10*3/uL (ref 4.0–10.5)

## 2016-02-25 LAB — URINALYSIS, ROUTINE W REFLEX MICROSCOPIC
Bilirubin Urine: NEGATIVE
GLUCOSE, UA: NEGATIVE mg/dL
Ketones, ur: NEGATIVE mg/dL
NITRITE: NEGATIVE
Protein, ur: NEGATIVE mg/dL
Specific Gravity, Urine: 1.004 — ABNORMAL LOW (ref 1.005–1.030)
pH: 6 (ref 5.0–8.0)

## 2016-02-25 LAB — COMPREHENSIVE METABOLIC PANEL
ALBUMIN: 2 g/dL — AB (ref 3.5–5.0)
ALT: 42 U/L (ref 14–54)
ANION GAP: 3 — AB (ref 5–15)
AST: 86 U/L — AB (ref 15–41)
Alkaline Phosphatase: 131 U/L — ABNORMAL HIGH (ref 38–126)
BILIRUBIN TOTAL: 2.5 mg/dL — AB (ref 0.3–1.2)
BUN: 11 mg/dL (ref 6–20)
CHLORIDE: 110 mmol/L (ref 101–111)
CO2: 20 mmol/L — AB (ref 22–32)
Calcium: 8 mg/dL — ABNORMAL LOW (ref 8.9–10.3)
Creatinine, Ser: 0.78 mg/dL (ref 0.44–1.00)
GFR calc Af Amer: >60 mL/min (ref >60)
GFR calc non Af Amer: >60 mL/min (ref >60)
GLUCOSE: 98 mg/dL (ref 65–99)
POTASSIUM: 4.7 mmol/L (ref 3.5–5.1)
Sodium: 133 mmol/L — ABNORMAL LOW (ref 135–145)
TOTAL PROTEIN: 7 g/dL (ref 6.5–8.1)

## 2016-02-25 LAB — LIPASE, BLOOD: Lipase: 26 U/L (ref 11–51)

## 2016-02-25 MED ORDER — IOPAMIDOL (ISOVUE-300) INJECTION 61%
100.0000 mL | Freq: Once | INTRAVENOUS | Status: AC | PRN
  Administered 2016-02-25: 100 mL via INTRAVENOUS

## 2016-02-25 MED ORDER — SODIUM CHLORIDE 0.9 % IV BOLUS (SEPSIS)
1000.0000 mL | Freq: Once | INTRAVENOUS | Status: AC
  Administered 2016-02-25: 1000 mL via INTRAVENOUS

## 2016-02-25 MED ORDER — IOPAMIDOL (ISOVUE-300) INJECTION 61%
INTRAVENOUS | Status: AC
  Administered 2016-02-25: 100 mL via INTRAVENOUS
  Filled 2016-02-25: qty 100

## 2016-02-25 MED ORDER — LIDOCAINE-EPINEPHRINE (PF) 2 %-1:200000 IJ SOLN
20.0000 mL | Freq: Once | INTRAMUSCULAR | Status: DC
  Filled 2016-02-25: qty 20

## 2016-02-25 MED ORDER — SULFAMETHOXAZOLE-TRIMETHOPRIM 800-160 MG PO TABS: 1.0000 | ORAL_TABLET | Freq: Two times a day (BID) | ORAL | 0 refills | Status: AC

## 2016-02-25 MED ORDER — SODIUM CHLORIDE 0.9 % IJ SOLN
INTRAMUSCULAR | Status: AC
  Filled 2016-02-25: qty 50

## 2016-02-25 MED ORDER — ONDANSETRON HCL 4 MG/2ML IJ SOLN
4.0000 mg | INTRAMUSCULAR | Status: AC
  Administered 2016-02-25: 4 mg via INTRAVENOUS
  Filled 2016-02-25: qty 2

## 2016-02-25 MED ORDER — MORPHINE SULFATE (PF) 4 MG/ML IV SOLN
4.0000 mg | Freq: Once | INTRAVENOUS | Status: AC
  Administered 2016-02-25: 4 mg via INTRAVENOUS
  Filled 2016-02-25: qty 1

## 2016-02-25 MED ORDER — DICYCLOMINE HCL 20 MG PO TABS: 20.0000 mg | ORAL_TABLET | Freq: Two times a day (BID) | ORAL | 0 refills | Status: DC

## 2016-02-25 MED ORDER — IBUPROFEN 800 MG PO TABS
800.0000 mg | ORAL_TABLET | Freq: Once | ORAL | Status: AC
  Administered 2016-02-25: 800 mg via ORAL
  Filled 2016-02-25: qty 1

## 2016-02-25 NOTE — ED Notes (Signed)
PA at bedside for I & D.

## 2016-02-25 NOTE — ED Notes (Signed)
Pt is sobbing loudly, sts pain is not getting any beter, she is requesting more pain medications. PA Will notified.

## 2016-02-25 NOTE — ED Notes (Signed)
Pt ambulated to bathroom independently, still c/o pain.

## 2016-02-25 NOTE — ED Notes (Signed)
Pt is requesting ibuprofen for pain, sts she is not allergic to ibuprofen only aspirin

## 2016-02-25 NOTE — ED Provider Notes (Signed)
[facility]-EMERGENCY DEPT Provider Note   CSN: [id number] Arrival date & time: 02/24/16  2259    By signing my name below, I, Valentino SaxonBianca Contreras, attest that this documentation has been prepared under the direction and in the presence of Everlene FarrierWilliam Alessandra Sawdey, [place]. Electronically Signed: Valentino SaxonBianca Contreras, ED Scribe. 02/25/16. 1:32 AM.  History   Chief Complaint Chief Complaint  Patient presents with  . Hernia  . Abscess   The history is provided by the patient. No language interpreter was used.   HPI Comments: Linda Vaughn is a 51 y.o. female with PMHx of gallstones, HTN and cirrhosis brought in by ambulance who presents to the Emergency Department complaining of constant, periumbilical abdominal pain for more than a year. She tells me she is frustrated with her abdominal pain and wants it all out. She reports generalized abdominal pain.  Pt reports associated episodic vomiting, subjective fever and chills several days ago.  Pt's temperature in the ED today was 98.3. She reports an abscess to her left shin that has been present for more than a year has been leaking pus. Pt states she took a laxative today with no minimal relief. No fevers today.  She denies diarrhea and blood in stool, coughing, urinary symptoms, rashes, lightheadedness, syncope.   Past Medical History:  Diagnosis Date  . Arthritis   . Cirrhosis of liver (HCC)   . Gallstones   . Hepatitis C   . Hypertension     Patient Active Problem List   Diagnosis Date Noted  . Hepatic encephalopathy (HCC) 10/08/2015  . Generalized abdominal pain 10/08/2015  . Sepsis (HCC)   . SBP (spontaneous bacterial peritonitis) (HCC)   . Hepatic cirrhosis (HCC)   . Chronic hepatitis C without hepatic coma (HCC)   . Polysubstance abuse   . Ascites 05/24/2015  . Tobacco dependence 04/04/2015  . Umbilical hernia without obstruction and without gangrene 04/04/2015  . Chronic ankle pain 01/27/2015  . Obesity 01/27/2015  . Depression 01/27/2015  .  Osteoarthritis 01/25/2015  . Cholelithiasis 04/20/2012  . HTN (hypertension) 04/20/2012  . Obesity (BMI 30.0-34.9) 04/20/2012    Past Surgical History:  Procedure Laterality Date  . ANKLE SURGERY    . ANKLE SURGERY    . CARPAL TUNNEL RELEASE    . CESAREAN SECTION    . CHOLECYSTECTOMY      OB History    No data available       Home Medications    Prior to Admission medications   Medication Sig Start Date End Date Taking? Authorizing Provider  lactulose (CHRONULAC) 10 GM/15ML solution TAKE 15 MLS TWICE A DAY 12/17/15  Yes Massie MaroonLachina M Hollis, FNP  spironolactone (ALDACTONE) 100 MG tablet TAKE 1 TABLET (100 MG TOTAL) BY MOUTH DAILY. 02/04/16  Yes Massie MaroonLachina M Hollis, FNP  dicyclomine (BENTYL) 20 MG tablet Take 1 tablet (20 mg total) by mouth 2 (two) times daily. 02/25/16   Everlene FarrierWilliam Chenell Lozon, PA-C  ondansetron (ZOFRAN ODT) 4 MG disintegrating tablet Take 1 tablet (4 mg total) by mouth every 8 (eight) hours as needed for nausea or vomiting. Patient not taking: Reported on 02/25/2016 02/02/16   Renne CriglerJoshua Geiple, PA-C  oxycodone (OXY-IR) 5 MG capsule Take 1 capsule (5 mg total) by mouth every 4 (four) hours as needed. Patient not taking: Reported on 02/25/2016 02/02/16   Renne CriglerJoshua Geiple, PA-C  sulfamethoxazole-trimethoprim (BACTRIM DS,SEPTRA DS) 800-160 MG tablet Take 1 tablet by mouth 2 (two) times daily. 02/25/16 03/03/16  Everlene FarrierWilliam Freja Faro, PA-C    Family History Family  History  Problem Relation Age of Onset  . Cancer Mother   . Rheum arthritis Father   . Diabetes Other     Social History Social History  Substance Use Topics  . Smoking status: Current Every Day Smoker    Packs/day: 0.50  . Smokeless tobacco: Never Used  . Alcohol use No     Comment: occ     Allergies   Aspirin; Ibuprofen; and Tylenol [acetaminophen]   Review of Systems Review of Systems  Constitutional: Positive for chills and fever.  HENT: Negative for congestion and sore throat.   Eyes: Negative for visual  disturbance.  Respiratory: Negative for cough, shortness of breath and wheezing.   Cardiovascular: Negative for chest pain and palpitations.  Gastrointestinal: Positive for abdominal distention, abdominal pain (periumbilical ) and vomiting. Negative for blood in stool, diarrhea and nausea.  Genitourinary: Negative for difficulty urinating, dysuria and hematuria.  Musculoskeletal: Negative for back pain and neck pain.  Skin: Positive for color change (redness). Negative for rash.  Neurological: Negative for syncope, light-headedness and headaches.     Physical Exam Updated Vital Signs BP 106/72 (BP Location: Left Arm)   Pulse 63   Temp 98.1 F (36.7 C) (Oral)   Resp 18   SpO2 93%   Physical Exam  Constitutional: She appears well-developed and well-nourished. No distress.  Nontoxic appearing. Patient resting comfortably in bed.   HENT:  Head: Normocephalic and atraumatic.  Mouth/Throat: Oropharynx is clear and moist.  Eyes: Conjunctivae are normal. Pupils are equal, round, and reactive to light. Right eye exhibits no discharge. Left eye exhibits no discharge.  Neck: Neck supple.  Cardiovascular: Normal rate, regular rhythm, normal heart sounds and intact distal pulses.  Exam reveals no gallop and no friction rub.   No murmur heard. Pulmonary/Chest: Effort normal and breath sounds normal. No respiratory distress. She has no wheezes. She has no rales.  Lungs clear to osculation.   Abdominal: Soft. Bowel sounds are normal. She exhibits distension. She exhibits no mass. There is tenderness. There is no rebound and no guarding. A hernia is present.  Slight ascites present. umbilical hernia noted. Generalized abdominal TTP without focal tenderness.    Musculoskeletal: She exhibits no edema or tenderness.  Lymphadenopathy:    She has no cervical adenopathy.  Neurological: She is alert. Coordination normal.  Skin: Skin is warm and dry. Capillary refill takes less than 2 seconds. No rash  noted. She is not diaphoretic. No erythema. No pallor.  Indurated and fluctuant abscess to left shin without surrounding erythema or discharge. Approximately 4 cm in size.   Psychiatric: She has a normal mood and affect. Her behavior is normal.  Nursing note and vitals reviewed.    ED Treatments / Results   DIAGNOSTIC STUDIES: Oxygen Saturation is 99% on RA, normal by my interpretation.    COORDINATION OF CARE: 1:26 AM Discussed treatment plan with pt at bedside which includes labs and abdomen and pelvis imaging and pt agreed to plan.   Labs (all labs ordered are listed, but only abnormal results are displayed) Labs Reviewed  COMPREHENSIVE METABOLIC PANEL - Abnormal; Notable for the following:       Result Value   Sodium 133 (*)    CO2 20 (*)    Calcium 8.0 (*)    Albumin 2.0 (*)    AST 86 (*)    Alkaline Phosphatase 131 (*)    Total Bilirubin 2.5 (*)    Anion gap 3 (*)    All  other components within normal limits  CBC WITH DIFFERENTIAL/PLATELET - Abnormal; Notable for the following:    RBC 3.19 (*)    Hemoglobin 11.8 (*)    HCT 32.3 (*)    MCV 101.3 (*)    MCH 37.0 (*)    MCHC 36.5 (*)    RDW 15.6 (*)    Platelets 122 (*)    Monocytes Absolute 1.1 (*)    All other components within normal limits  URINALYSIS, ROUTINE W REFLEX MICROSCOPIC - Abnormal; Notable for the following:    APPearance CLOUDY (*)    Specific Gravity, Urine 1.004 (*)    Hgb urine dipstick SMALL (*)    Leukocytes, UA MODERATE (*)    Bacteria, UA RARE (*)    Squamous Epithelial / LPF 6-30 (*)    All other components within normal limits  LIPASE, BLOOD    EKG  EKG Interpretation None       Radiology Ct Abdomen Pelvis W Contrast  Result Date: 02/25/2016 CLINICAL DATA:  51 year old female with periumbilical pain, nausea and vomiting. History of cirrhosis. Hepatitis-C. EXAM: CT ABDOMEN AND PELVIS WITH CONTRAST TECHNIQUE: Multidetector CT imaging of the abdomen and pelvis was performed using  the standard protocol following bolus administration of intravenous contrast. CONTRAST:  100 cc Isovue-300 COMPARISON:  Abdominal CT dated 02/02/2016 FINDINGS: Lower chest: The visualized lung bases are clear. No intra-abdominal free air.  Small ascites. Hepatobiliary: Choose morphologic changes of cirrhosis. Cholecystectomy. Pancreas: Unremarkable. No pancreatic ductal dilatation or surrounding inflammatory changes. Spleen: Normal in size without focal abnormality. Adrenals/Urinary Tract: The adrenal glands, kidneys, and urinary bladder appear unremarkable. Stomach/Bowel: Evaluation of the bowel is limited in the absence of oral contrast as well as due to ascites. There is sigmoid diverticulosis with muscular hypertrophy. No evidence of bowel obstruction. Thickened appearing caps loops of bowel likely related to underdistention and hepatic enteropathy. Normal appendix. Vascular/Lymphatic: There is mild aortoiliac atherosclerotic disease. Set the SMV, splenic vein, and main portal vein are patent. No portal venous gas identified. The IVC appears unremarkable. There is recanalization of the paraumbilical vein compatible with portal hypertension. No adenopathy. Reproductive: The uterus and ovaries are grossly unremarkable. Other: Abdomen as diffuse mesenteric edema and anasarca. There is paraumbilical hernia containing ascitic fluid and measuring up to 3.5 x 6.9 cm. Musculoskeletal: Degenerative changes of the spine. No acute fracture. IMPRESSION: 1. Cirrhosis with small ascites and anasarca. Periumbilical hernia containing ascitic fluid. 2. Patent main portal vein. 3. 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 4. No bowel obstruction. Thickened appearing loops of bowel likely related to cirrhosis and hepatic enteropathy. Normal appendix. Electronically Signed   By: Elgie Collard M.D.   On: 02/25/2016 05:55    Procedures .Marland KitchenIncision and Drainage Date/Time: 02/25/2016 7:02  AM Performed by: Everlene Farrier Authorized by: Everlene Farrier   Consent:    Consent obtained:  Verbal   Consent given by:  Patient   Risks discussed:  Bleeding, pain, incomplete drainage and infection   Alternatives discussed:  No treatment, alternative treatment and observation Location:    Type:  Abscess   Size:  4 cm   Location:  Lower extremity   Lower extremity location:  Leg   Leg location:  L lower leg Pre-procedure details:    Skin preparation:  Betadine Anesthesia (see MAR for exact dosages):    Anesthesia method:  Local infiltration   Local anesthetic:  Lidocaine 2% WITH epi Procedure type:    Complexity:  Simple Procedure details:  Needle aspiration: no     Incision types:  Single straight   Incision depth:  Dermal   Scalpel blade:  11   Wound management:  Irrigated with saline   Drainage:  Purulent   Drainage amount:  Copious   Wound treatment:  Wound left open   Packing materials:  None Post-procedure details:    Patient tolerance of procedure:  Tolerated well, no immediate complications   (including critical care time)  Medications Ordered in ED Medications  sodium chloride 0.9 % injection (not administered)  lidocaine-EPINEPHrine (XYLOCAINE W/EPI) 2 %-1:200000 (PF) injection 20 mL (not administered)  ibuprofen (ADVIL,MOTRIN) tablet 800 mg (not administered)  sodium chloride 0.9 % bolus 1,000 mL (0 mLs Intravenous Stopped 02/25/16 0419)  iopamidol (ISOVUE-300) 61 % injection 100 mL (100 mLs Intravenous Contrast Given 02/25/16 0508)  morphine 4 MG/ML injection 4 mg (4 mg Intravenous Given 02/25/16 0348)  ondansetron (ZOFRAN) injection 4 mg (4 mg Intravenous Given 02/25/16 0356)     Initial Impression / Assessment and Plan / ED Course  I have reviewed the triage vital signs and the nursing notes.  Pertinent labs & imaging results that were available during my care of the patient were reviewed by me and considered in my medical decision making (see chart  for details).    This is a 51 y.o. female with PMHx of gallstones, HTN and cirrhosis brought in by ambulance who presents to the Emergency Department complaining of constant, periumbilical abdominal pain for more than a year. She tells me she is frustrated with her abdominal pain and wants it all out. She reports generalized abdominal pain.  Pt reports associated episodic vomiting, subjective fever and chills several days ago.  Pt's temperature in the ED today was 98.3. She reports an abscess to her left shin that has been present for more than a year has been leaking pus.  On exam the patient is afebrile nontoxic appearing. Her abdomen is soft is mild generalized abdominal tenderness to palpation. Umbilical hernia present. Mild ascites present. No peritoneal signs. She also has a forced meter abscess related to her left shin. No streaking or surrounding erythema. Urinalysis nitrite ligated with moderate leukocytes. Patient denies urinary symptoms. CMP is around the patient's baseline with liver function. Creatinine is 0.78. Lipase is within normal limits. CBC shows no leukocytosis. CT abdomen and pelvis shows cirrhosis with small ascites. Umbilical hernia containing ascitic fluid. No bowel obstruction. Normal appendix. I have low suspicion for SBP in this patient as patient is afebrile, nontoxic appearing and has only mild generalized abdominal tenderness. She generally appears comfortable in the bed and is laughing at my jokes. She has no leukocytosis. I doubt SBP or acute abdominal pathology at this time. Patient's been having this pain for a year. Incision and drainage of the abscess was performed by me and tolerated well by the patient. Will cover with some Bactrim. I discussed return precautions and wound care instructions. Patient has follow-up with primary care in 4 days. I encouraged her to keep this appointment. I discussed strict and specific return precautions. I advised the patient to follow-up  with their primary care provider this week. I advised the patient to return to the emergency department with new or worsening symptoms or new concerns. The patient verbalized understanding and agreement with plan.    This patient was discussed with Dr. Wilkie Aye who agrees with assessment and plan.   Final Clinical Impressions(s) / ED Diagnoses   Final diagnoses:  Generalized abdominal pain  Abscess    New Prescriptions New Prescriptions   DICYCLOMINE (BENTYL) 20 MG TABLET    Take 1 tablet (20 mg total) by mouth 2 (two) times daily.   SULFAMETHOXAZOLE-TRIMETHOPRIM (BACTRIM DS,SEPTRA DS) 800-160 MG TABLET    Take 1 tablet by mouth 2 (two) times daily.    I personally performed the services described in this documentation, which was scribed in my presence. The recorded information has been reviewed and is accurate.       Everlene Farrier, PA-C 02/25/16 1610    Shon Baton, MD 02/27/16 [phone]

## 2016-02-29 ENCOUNTER — Ambulatory Visit: Payer: Medicaid Other | Admitting: Family Medicine

## 2016-03-12 ENCOUNTER — Telehealth: Payer: Self-pay

## 2016-03-12 ENCOUNTER — Ambulatory Visit (INDEPENDENT_AMBULATORY_CARE_PROVIDER_SITE_OTHER): Payer: Medicaid Other | Admitting: Family Medicine

## 2016-03-12 ENCOUNTER — Other Ambulatory Visit: Payer: Self-pay | Admitting: Family Medicine

## 2016-03-12 ENCOUNTER — Encounter: Payer: Self-pay | Admitting: Family Medicine

## 2016-03-12 VITALS — BP 102/71 | HR 57 | Temp 97.6°F | Resp 18 | Ht 68.0 in

## 2016-03-12 DIAGNOSIS — F1910 Other psychoactive substance abuse, uncomplicated: Secondary | ICD-10-CM | POA: Diagnosis not present

## 2016-03-12 DIAGNOSIS — K7031 Alcoholic cirrhosis of liver with ascites: Secondary | ICD-10-CM | POA: Diagnosis not present

## 2016-03-12 DIAGNOSIS — F69 Unspecified disorder of adult personality and behavior: Secondary | ICD-10-CM

## 2016-03-12 DIAGNOSIS — B182 Chronic viral hepatitis C: Secondary | ICD-10-CM

## 2016-03-12 DIAGNOSIS — K429 Umbilical hernia without obstruction or gangrene: Secondary | ICD-10-CM | POA: Diagnosis not present

## 2016-03-12 DIAGNOSIS — R1033 Periumbilical pain: Secondary | ICD-10-CM

## 2016-03-12 LAB — CBC WITH DIFFERENTIAL/PLATELET
BASOS PCT: 1 %
Basophils Absolute: 37 cells/uL (ref 0–200)
EOS ABS: 111 {cells}/uL (ref 15–500)
Eosinophils Relative: 3 %
HEMATOCRIT: 38.2 % (ref 35.0–45.0)
HEMOGLOBIN: 13.1 g/dL (ref 11.7–15.5)
LYMPHS ABS: 1332 {cells}/uL (ref 850–3900)
LYMPHS PCT: 36 %
MCH: 36.5 pg — ABNORMAL HIGH (ref 27.0–33.0)
MCHC: 34.3 g/dL (ref 32.0–36.0)
MCV: 106.4 fL — AB (ref 80.0–100.0)
MONO ABS: 666 {cells}/uL (ref 200–950)
MPV: 11.8 fL (ref 7.5–12.5)
Monocytes Relative: 18 %
NEUTROS PCT: 42 %
Neutro Abs: 1554 cells/uL (ref 1500–7800)
Platelets: 116 10*3/uL — ABNORMAL LOW (ref 140–400)
RBC: 3.59 MIL/uL — AB (ref 3.80–5.10)
RDW: 15.6 % — ABNORMAL HIGH (ref 11.0–15.0)
WBC: 3.7 10*3/uL — AB (ref 3.8–10.8)

## 2016-03-12 LAB — COMPLETE METABOLIC PANEL WITH GFR
ALT: 39 U/L — AB (ref 6–29)
AST: 81 U/L — AB (ref 10–35)
Albumin: 2.1 g/dL — ABNORMAL LOW (ref 3.6–5.1)
Alkaline Phosphatase: 153 U/L — ABNORMAL HIGH (ref 33–130)
BUN: 11 mg/dL (ref 7–25)
CALCIUM: 8.2 mg/dL — AB (ref 8.6–10.4)
CHLORIDE: 105 mmol/L (ref 98–110)
CO2: 23 mmol/L (ref 20–31)
CREATININE: 0.73 mg/dL (ref 0.50–1.05)
GFR, Est African American: >89 mL/min (ref >=60)
GFR, Est Non African American: >89 mL/min (ref >=60)
Glucose, Bld: 91 mg/dL (ref 65–99)
POTASSIUM: 4 mmol/L (ref 3.5–5.3)
Sodium: 135 mmol/L (ref 135–146)
Total Bilirubin: 2.3 mg/dL — ABNORMAL HIGH (ref 0.2–1.2)
Total Protein: 7.6 g/dL (ref 6.1–8.1)

## 2016-03-12 LAB — POCT URINALYSIS DIP (DEVICE)
Glucose, UA: NEGATIVE mg/dL
HGB URINE DIPSTICK: NEGATIVE
Ketones, ur: NEGATIVE mg/dL
Nitrite: NEGATIVE
PH: 6.5 (ref 5.0–8.0)
Protein, ur: NEGATIVE mg/dL
SPECIFIC GRAVITY, URINE: 1.02 (ref 1.005–1.030)
Urobilinogen, UA: >=8 mg/dL (ref 0.0–1.0)

## 2016-03-12 MED ORDER — OXYCODONE HCL 5 MG PO CAPS: 5.0000 mg | ORAL_CAPSULE | Freq: Four times a day (QID) | ORAL | 0 refills | Status: DC | PRN

## 2016-03-12 NOTE — Progress Notes (Signed)
Subjective:    Patient ID: Linda Vaughn, female    DOB: 1965-04-28, 51 y.o.   MRN: [id number]  HPI Linda Vaughn, a 51 year old female that presents to complaining of a wosening umbilical hernia. Patient was evaluated in the emergency department on 02/25/2016 for generalized abdominal pain. She was evaluated several months ago and given a referral to general surgery for further evaluation. She says that she did not follow up in general surgery. She is tearful, agitated and says that pain intensity is 10/10. She had an abdominal CT on 02/25/2016 that showed a 3.5 X 6.9 cm paraumbilical hernia containing ascitic fluid. She also has cirrhosis and has been lost to follow up with gastroenterology.  She states that pain often increases after eating large meals.  Aggravating factors include eating large meals. Associated symptoms include bloating and abdominal distention. The patient denies fever, fatigue, dysuria, nausea, vomiting, or constipation.   Past Medical History:  Diagnosis Date  . Arthritis   . Cirrhosis of liver (HCC)   . Gallstones   . Hepatitis C   . Hypertension    Past Surgical History:  Procedure Laterality Date  . ANKLE SURGERY    . ANKLE SURGERY    . CARPAL TUNNEL RELEASE    . CESAREAN SECTION    . CHOLECYSTECTOMY     Social History   Social History  . Marital status: Single    Spouse name: N/A  . Number of children: N/A  . Years of education: N/A   Occupational History  . Not on file.   Social History Main Topics  . Smoking status: Current Every Day Smoker    Packs/day: 0.25  . Smokeless tobacco: Never Used  . Alcohol use No     Comment: occ  . Drug use: No  . Sexual activity: Not Currently   Other Topics Concern  . Not on file   Social History Narrative  . No narrative on file   There is no immunization history on file for this patient.  Allergies  Allergen Reactions  . Aspirin Other (See Comments)    Liver damage   . Ibuprofen Other (See  Comments)    Liver damage  . Tylenol [Acetaminophen] Other (See Comments)    Liver damage   Review of Systems  Constitutional: Negative.  Negative for fatigue, fever and unexpected weight change.  HENT: Negative.   Eyes: Negative.  Negative for visual disturbance.  Respiratory: Negative.   Cardiovascular: Negative.   Gastrointestinal: Positive for abdominal distention and abdominal pain.  Endocrine: Negative.  Negative for polydipsia, polyphagia and polyuria.  Genitourinary: Negative.  Negative for difficulty urinating, dysuria and urgency.  Musculoskeletal: Negative.   Skin: Negative.   Allergic/Immunologic: Negative for immunocompromised state.  Neurological: Negative.   Hematological: Negative.   Psychiatric/Behavioral: Negative.        Objective:   Physical Exam  Constitutional: She is oriented to person, place, and time. She appears well-developed and well-nourished.  HENT:  Head: Normocephalic and atraumatic.  Right Ear: External ear normal.  Left Ear: External ear normal.  Nose: Nose normal.  Mouth/Throat: Oropharynx is clear and moist.  Eyes: Conjunctivae and EOM are normal. Pupils are equal, round, and reactive to light.  Neck: Normal range of motion. Neck supple.  Cardiovascular: Normal rate, regular rhythm and normal heart sounds.   Pulmonary/Chest: Effort normal and breath sounds normal.  Abdominal: Soft. Bowel sounds are normal. There is tenderness in the periumbilical area. A hernia is present.  Musculoskeletal: Normal range of motion.  Neurological: She is alert and oriented to person, place, and time. She has normal reflexes.  Skin: Skin is warm and dry.  Psychiatric: Her speech is normal. Judgment and thought content normal. Her affect is angry. She is agitated and aggressive.     BP 102/71 (BP Location: Left Arm, Patient Position: Sitting, Cuff Size: Large)   Pulse (!) 57   Temp 97.6 F (36.4 C) (Oral)   Resp 18   Ht 5\' 8"  (1.727 m)   SpO2 100%   Assessment & Plan:  1. Umbilical hernia without obstruction and without gangrene Reviewed hospital notes and CT of the abdomen and pelvis. There is a sub-cm periumbilical hernia defect asociated with a paraumbilical hernia.  Will send a referral to general surgery for further workup and evaluation due to pain associated with hernia.  -ambulatory referral to general surgery - COMPLETE METABOLIC PANEL WITH GFR - CBC with Differential  2. Chronic hepatitis C without hepatic coma (HCC) Patient to follow up in gastroenterology as scheduled  3. Alcoholic cirrhosis of liver with ascites (HCC) There have been some probable compliance issues with follow up. I have discussed with her the great importance of following the treatment plan exactly as directed in order to achieve a good medical outcome.  4. Periumbilical abdominal pain Reviewed [place] Substance Reporting system prior to prescribing opiate medications, no inconsistencies noted.   - oxycodone (OXY-IR) 5 MG capsule; Take 1 capsule (5 mg total) by mouth every 6 (six) hours as needed.  Dispense: 20 capsule; Refill: 0  5. Behavior concern in adult Patient denies recent drug and alcohol use. She is tearful, angry, and agitated.  - Drug Screen, Urine  6. Polysubstance abuse  - Drug Screen, Urine   RTC: Patient to schedule follow up appointment following surgical consult  Linda Vaughn M, FNP  The patient was given clear instructions to go to ER or return to medical center if symptoms do not improve, worsen or new problems develop. The patient verbalized understanding. Will notify patient with laboratory results.

## 2016-03-12 NOTE — Telephone Encounter (Signed)
[facility] sent a fax today saying they have called and left patient a voicemail. She is scheduled with their office on 03/19/2016 @3 ;15pm. Thanks!

## 2016-03-13 ENCOUNTER — Other Ambulatory Visit: Payer: Self-pay | Admitting: Family Medicine

## 2016-03-15 LAB — CREATININE, URINE, RANDOM: CREATININE, URINE: 188 mg/dL (ref 20–320)

## 2016-03-16 LAB — DRUG ABUSE PANEL 10-50 NO CONF, U
AMPHETAMINES (1000 NG/ML SCRN): NEGATIVE
BARBITURATES: NEGATIVE
BENZODIAZEPINES: NEGATIVE
COCAINE METABOLITES: NEGATIVE
MARIJUANA MET (50 NG/ML SCRN): POSITIVE — AB
METHADONE: NEGATIVE
METHAQUALONE: NEGATIVE
OPIATES: NEGATIVE
PHENCYCLIDINE: NEGATIVE
PROPOXYPHENE: NEGATIVE

## 2016-03-18 ENCOUNTER — Other Ambulatory Visit: Payer: Self-pay | Admitting: Family Medicine

## 2016-03-19 ENCOUNTER — Emergency Department (HOSPITAL_COMMUNITY)
Admission: EM | Admit: 2016-03-19 | Discharge: 2016-03-19 | Disposition: A | Discharge status: Home / Self Care | Payer: Medicaid Other | Attending: Emergency Medicine | Admitting: Emergency Medicine

## 2016-03-19 ENCOUNTER — Encounter (HOSPITAL_COMMUNITY): Payer: Self-pay | Admitting: *Deleted

## 2016-03-19 DIAGNOSIS — I10 Essential (primary) hypertension: Secondary | ICD-10-CM | POA: Diagnosis not present

## 2016-03-19 DIAGNOSIS — K429 Umbilical hernia without obstruction or gangrene: Secondary | ICD-10-CM | POA: Diagnosis present

## 2016-03-19 DIAGNOSIS — Z79899 Other long term (current) drug therapy: Secondary | ICD-10-CM | POA: Diagnosis not present

## 2016-03-19 DIAGNOSIS — F17200 Nicotine dependence, unspecified, uncomplicated: Secondary | ICD-10-CM | POA: Insufficient documentation

## 2016-03-19 DIAGNOSIS — R188 Other ascites: Secondary | ICD-10-CM | POA: Diagnosis not present

## 2016-03-19 LAB — I-STAT CG4 LACTIC ACID, ED: Lactic Acid, Venous: 0.96 mmol/L (ref 0.5–1.9)

## 2016-03-19 LAB — I-STAT CHEM 8, ED
BUN: 11 mg/dL (ref 6–20)
CALCIUM ION: 1.12 mmol/L — AB (ref 1.15–1.40)
CHLORIDE: 105 mmol/L (ref 101–111)
CREATININE: 0.8 mg/dL (ref 0.44–1.00)
GLUCOSE: 87 mg/dL (ref 65–99)
HCT: 39 % (ref 36.0–46.0)
Hemoglobin: 13.3 g/dL (ref 12.0–15.0)
Potassium: 4.2 mmol/L (ref 3.5–5.1)
Sodium: 138 mmol/L (ref 135–145)
TCO2: 25 mmol/L (ref 0–100)

## 2016-03-19 LAB — COMPREHENSIVE METABOLIC PANEL
ALBUMIN: 2 g/dL — AB (ref 3.5–5.0)
ALK PHOS: 131 U/L — AB (ref 38–126)
ALT: 46 U/L (ref 14–54)
ANION GAP: 3 — AB (ref 5–15)
AST: 85 U/L — AB (ref 15–41)
BILIRUBIN TOTAL: 2.5 mg/dL — AB (ref 0.3–1.2)
BUN: 12 mg/dL (ref 6–20)
CALCIUM: 8.2 mg/dL — AB (ref 8.9–10.3)
CO2: 25 mmol/L (ref 22–32)
CREATININE: 0.89 mg/dL (ref 0.44–1.00)
Chloride: 105 mmol/L (ref 101–111)
GFR calc Af Amer: >60 mL/min (ref >60)
GFR calc non Af Amer: >60 mL/min (ref >60)
GLUCOSE: 88 mg/dL (ref 65–99)
Potassium: 4.1 mmol/L (ref 3.5–5.1)
Sodium: 133 mmol/L — ABNORMAL LOW (ref 135–145)
TOTAL PROTEIN: 7.5 g/dL (ref 6.5–8.1)

## 2016-03-19 LAB — CBC WITH DIFFERENTIAL/PLATELET
BASOS ABS: 0 10*3/uL (ref 0.0–0.1)
BLASTS: 0 %
Band Neutrophils: 0 %
Basophils Relative: 1 %
Eosinophils Absolute: 0.1 10*3/uL (ref 0.0–0.7)
Eosinophils Relative: 3 %
HEMATOCRIT: 33.7 % — AB (ref 36.0–46.0)
HEMOGLOBIN: 12.1 g/dL (ref 12.0–15.0)
LYMPHS PCT: 41 %
Lymphs Abs: 1.9 10*3/uL (ref 0.7–4.0)
MCH: 36.3 pg — ABNORMAL HIGH (ref 26.0–34.0)
MCHC: 35.9 g/dL (ref 30.0–36.0)
MCV: 101.2 fL — ABNORMAL HIGH (ref 78.0–100.0)
METAMYELOCYTES PCT: 0 %
MONOS PCT: 19 %
Monocytes Absolute: 0.8 10*3/uL (ref 0.1–1.0)
Myelocytes: 0 %
Neutro Abs: 1.6 10*3/uL — ABNORMAL LOW (ref 1.7–7.7)
Neutrophils Relative %: 36 %
Other: 0 %
PROMYELOCYTES ABS: 0 %
Platelets: 100 10*3/uL — ABNORMAL LOW (ref 150–400)
RBC: 3.33 MIL/uL — AB (ref 3.87–5.11)
RDW: 15.5 % (ref 11.5–15.5)
WBC: 4.4 10*3/uL (ref 4.0–10.5)
nRBC: 2 /100 WBC — ABNORMAL HIGH

## 2016-03-19 LAB — LIPASE, BLOOD: Lipase: 23 U/L (ref 11–51)

## 2016-03-19 MED ORDER — SODIUM CHLORIDE 0.9 % IV BOLUS (SEPSIS)
1000.0000 mL | Freq: Once | INTRAVENOUS | Status: AC
  Administered 2016-03-19: 1000 mL via INTRAVENOUS

## 2016-03-19 MED ORDER — FENTANYL CITRATE (PF) 100 MCG/2ML IJ SOLN
100.0000 ug | Freq: Once | INTRAMUSCULAR | Status: AC
  Administered 2016-03-19: 100 ug via INTRAVENOUS
  Filled 2016-03-19: qty 2

## 2016-03-19 MED ORDER — ONDANSETRON HCL 4 MG/2ML IJ SOLN
4.0000 mg | Freq: Once | INTRAMUSCULAR | Status: AC
  Administered 2016-03-19: 4 mg via INTRAVENOUS
  Filled 2016-03-19: qty 2

## 2016-03-19 NOTE — ED Notes (Signed)
Bed: [id number] Expected date:  Expected time:  Means of arrival:  Comments: 51 yo f hernia

## 2016-03-19 NOTE — ED Notes (Signed)
Pt extremely verbally aggressive to staff due to discharge decision . Pt left room prior to discharge instructions.

## 2016-03-19 NOTE — Consult Note (Signed)
Linda Vaughn 03/19/2016 2:58 PM Location: [facility] Patient #: [id number] DOB: [birth year] Undefined / Language: [place] / Race: Black or African American Female   History of Present Illness Linda Vaughn(Steven C. Gross MD; 03/19/2016 3:33 PM) The patient is a 51 year old female who presents with an abdominal wall hernia. Note for "Abdominal hernia": Patient sent for surgical consultation at the request of Massie MaroonLachina M Hollis, FNP  Concern for symptomatic hernia.  89 female with a history of cirrhosis and polysubstance abuse in the past. Several months. She had an episode of pain and went to the emergency room. Evaluation showed ascites. Also obvious hernia with some tissue within it. Suggested follow-up with surgery. That did not happen. Emergency room again was abdominal pain. Umbilical hernia larger.. Follow-up with primary care physician. Surgical consultation requested. Patient does struggle with chronic abdominal pain. Follow-up by gastroenterology in the past. Issues with compliance with [facility]. Patient nearly passed out from severe pain in the holding area in our office today. Patient notes that the abdominal pain became much more intense last night, unbearable pain in the abdomen. Complaining she's been vomiting all night. She was dropped off by her fianc. We cannot get a hold of him now. She is in agony. An ambulance has been called to try and have emergent evaluation for incarcerated hernia that is possibly strangulated   Past Surgical History Timmothy Euler([place], [place]; 03/19/2016 3:10 PM) No pertinent past surgical history   Diagnostic Studies History Timmothy Euler([place]; 03/19/2016 3:10 PM) Colonoscopy  never  Allergies Barron Alvine([place], CMA; 03/19/2016 3:11 PM) No Known Drug Allergies 03/19/2016 Allergies Reconciled   Medication History Timmothy Euler(Sade Bradford, CMA; 03/19/2016 3:12 PM) Lactulose (10GM/15ML Solution, Oral daily) Active. Spironolactone  (100MG  Tablet, Oral daily) Active. OxyCODONE HCl (5MG  Tablet, Oral as needed) Active. Medications Reconciled  Social History Timmothy Euler([place]; 03/19/2016 3:10 PM) Alcohol use  Remotely quit alcohol use. No caffeine use  No drug use  Tobacco use  Current some day smoker.  Family History Timmothy Euler([place]; 03/19/2016 3:10 PM) Alcohol Abuse  Brother. Arthritis  Brother, Father, Mother. Hypertension  Father.  Other Problems Timmothy Euler([place]; 03/19/2016 3:10 PM) Arthritis  Hepatitis  Umbilical Hernia Repair     Review of Systems Timmothy Euler(Sade Bradford CMA; 03/19/2016 3:10 PM) General Present- Chills and Weight Loss. Not Present- Appetite Loss, Fatigue, Fever, Night Sweats and Weight Gain. Skin Not Present- Change in Wart/Mole, Dryness, Hives, Jaundice, New Lesions, Non-Healing Wounds, Rash and Ulcer. HEENT Present- Visual Disturbances and Wears glasses/contact lenses. Not Present- Earache, Hearing Loss, Hoarseness, Nose Bleed, Oral Ulcers, Ringing in the Ears, Seasonal Allergies, Sinus Pain, Sore Throat and Yellow Eyes. Respiratory Present- Snoring. Not Present- Bloody sputum, Chronic Cough, Difficulty Breathing and Wheezing. Cardiovascular Present- Leg Cramps and Swelling of Extremities. Not Present- Chest Pain, Difficulty Breathing Lying Down, Palpitations, Rapid Heart Rate and Shortness of Breath. Gastrointestinal Present- Bloating, Constipation, Gets full quickly at meals and Nausea. Not Present- Abdominal Pain, Bloody Stool, Change in Bowel Habits, Chronic diarrhea, Difficulty Swallowing, Excessive gas, Hemorrhoids, Indigestion, Rectal Pain and Vomiting. Female Genitourinary Not Present- Blood in Urine, Change in Urinary Stream, Frequency, Impotence, Nocturia, Painful Urination, Urgency and Urine Leakage. Musculoskeletal Present- Joint Stiffness and Swelling of Extremities. Not Present- Back Pain, Joint Pain, Muscle Pain and Muscle Weakness. Neurological Present- Numbness,  Tingling and Weakness. Not Present- Decreased Memory, Fainting, Headaches, Seizures, Tremor and Trouble walking. Psychiatric Present- Change in Sleep Pattern. Not Present- Anxiety, Bipolar, Depression, Fearful and Frequent crying. Endocrine  Not Present- Cold Intolerance, Excessive Hunger, Hair Changes, Heat Intolerance, Hot flashes and New Diabetes. Hematology Not Present- Blood Thinners, Easy Bruising, Excessive bleeding, Gland problems, HIV and Persistent Infections.  Vitals Barron Alvine Bradford CMA; 03/19/2016 3:12 PM) 03/19/2016 3:12 PM Weight Measurement Declined Temp.: 31F  Pulse: 78 (Regular)  BP: 142/82 (Sitting, Left Arm, Standard)       Physical Exam Linda Sportsman MD; 03/19/2016 3:29 PM) General Mental Status-Alert. General Appearance-Not in acute distress, Not Sickly. Orientation-Oriented X3. Hydration-Well hydrated. Voice-Normal. Note: Crying and tearful.   Integumentary Global Assessment Upon inspection and palpation of skin surfaces of the - Axillae: non-tender, no inflammation or ulceration, no drainage. and Distribution of scalp and body hair is normal. General Characteristics Temperature - normal warmth is noted.  Head and Neck Head-normocephalic, atraumatic with no lesions or palpable masses. Face Global Assessment - atraumatic, no absence of expression. Neck Global Assessment - no abnormal movements, no bruit auscultated on the right, no bruit auscultated on the left, no decreased range of motion, non-tender. Trachea-midline. Thyroid Gland Characteristics - non-tender.  Eye Eyeball - Left-Extraocular movements intact, No Nystagmus. Eyeball - Right-Extraocular movements intact, No Nystagmus. Cornea - Left-No Hazy. Cornea - Right-No Hazy. Sclera/Conjunctiva - Left-No scleral icterus, No Discharge. Sclera/Conjunctiva - Right-No scleral icterus, No Discharge. Pupil - Left-Direct reaction to light normal. Pupil -  Right-Direct reaction to light normal. Note: Wears glasses. Vision corrected   ENMT Ears Pinna - Left - no drainage observed, no generalized tenderness observed. Right - no drainage observed, no generalized tenderness observed. Nose and Sinuses External Inspection of the Nose - no destructive lesion observed. Inspection of the nares - Left - quiet respiration. Right - quiet respiration. Mouth and Throat Lips - Upper Lip - no fissures observed, no pallor noted. Lower Lip - no fissures observed, no pallor noted. Nasopharynx - no discharge present. Oral Cavity/Oropharynx - Tongue - no dryness observed. Oral Mucosa - no cyanosis observed. Hypopharynx - no evidence of airway distress observed.  Chest and Lung Exam Inspection Movements - Normal and Symmetrical. Accessory muscles - No use of accessory muscles in breathing. Palpation Palpation of the chest reveals - Non-tender. Auscultation Breath sounds - Normal and Clear.  Cardiovascular Auscultation Rhythm - Regular. Murmurs & Other Heart Sounds - Auscultation of the heart reveals - No Murmurs and No Systolic Clicks.  Abdomen Inspection Inspection of the abdomen reveals - No Visible peristalsis and No Abnormal pulsations. Umbilicus - No Bleeding, No Urine drainage. Palpation/Percussion Palpation and Percussion of the abdomen reveal - Soft, Non Tender, No Rebound tenderness, No Rigidity (guarding) and No Cutaneous hyperesthesia. Note: 7 x 5 cm periumbilical mass exquisitely tender. Not reducible. Consistent with incarcerated periumbilical hernia.   Peripheral Vascular Upper Extremity Inspection - Left - No Cyanotic nailbeds, Not Ischemic. Right - No Cyanotic nailbeds, Not Ischemic.  Neurologic Neurologic evaluation reveals -normal attention span and ability to concentrate, able to name objects and repeat phrases. Appropriate fund of knowledge , normal sensation and normal coordination. Mental Status Affect - not angry, not  paranoid. Cranial Nerves-Normal Bilaterally. Gait-Normal.  Neuropsychiatric Mental status exam performed with findings of-able to articulate well with normal speech/language, rate, volume and coherence, thought content normal with ability to perform basic computations and apply abstract reasoning and no evidence of hallucinations, delusions, obsessions or homicidal/suicidal ideation.  Musculoskeletal Global Assessment Spine, Ribs and Pelvis - no instability, subluxation or laxity. Right Upper Extremity - no instability, subluxation or laxity.  Lymphatic Head & Neck  General Head &  Neck Lymphatics: Bilateral - Description - No Localized lymphadenopathy. Axillary  General Axillary Region: Bilateral - Description - No Localized lymphadenopathy. Femoral & Inguinal  Generalized Femoral & Inguinal Lymphatics: Left - Description - No Localized lymphadenopathy. Right - Description - No Localized lymphadenopathy.    Assessment & Plan Linda Sportsman MD; 03/19/2016 3:35 PM) Dorethea Clan VENTRAL HERNIA (K46.0) Impression: Patient with exquisite pain and a reducible periumbilical mass consistent with incarcerated hernia in the setting of cirrhosis and ascites.  While she has issues with cirrhosis and polysubstance abuse and alcoholism that make evaluating her children, I do think she has legitimate severe pain with obstructive symptoms.. The periumbilical past on the office and is so tender she will let me touch it.  With her episodes of emesis since last night , I think she would benefit from emergent surgical repair. Risks of surgery increased with his cirrhosis and emergency surgery, but I do not see any other good options. She is in agony and agrees. Most likely the patient will require medical gastroenterology following to help manage her health issues. We called an ambulance and has her fianc cannot make it back. Called and discussed with Dr. Gerrit Friends on the emergency surgery [facility]  at [facility]. Most likely will be the on-call person. [facility] ED aware as well.    Signed by Linda Sportsman, MD (03/19/2016 3:37 Pm)   Addendum: Sydnee Lamour is a 51yo female PMH Hepatitis C and cirrhosis (previously seen by Dr. Myrtie Neither but was never compliant with follow-up), remote h/o polysubstance abuse (denies drug use in 25 years). Patient reports 2 month history of gradually worsening umbilical hernia. Seen in ED 02/25/16 where CT scan showed the hernia to only contain ascitic fluid. States that this morning she woke up in severe pain. She has had multiple episodes of n/v. She did have a hard BM this morning, and is passing a little flatus. Sent to ED from [facility] office by Dr. Michaell Cowing for evaluation of possible strangulated hernia. On exam there are no bowel sounds heard in the hernia. It is very tender but soft and reducible.  Edson Snowball  General Surgery [facility]  Patient seen and examined in the ER.  Discussed with Evangeline Gula and with Dr. Adela Lank.  Exam demonstrates a peri-umbilical hernia.  This is completely reducible with mild discomfort.  The hernia contains only ascites fluid which can be expressed out of the hernia sac and the fascial defect easily palpated measuring approximately 3 cm in diameter.  No incarcerated bowel present.  This exam is consistent with prior CT scan.  Repeat scan not needed to evaluate hernia.  Recommend medical management of liver disease and ascites.  May require paracentesis.  Operative repair of hernia carries significant risk of recurrence, immediate failure, ascites leak, bacterial peritonitis, and bleeding due to venous collaterals and portal HTN.  Would not repair operatively at this time.  Medical service to evaluate.  Surgery will sign off - call if needed.  Velora Heckler, MD, [facility] Office: [phone]

## 2016-03-19 NOTE — ED Provider Notes (Signed)
[facility]-EMERGENCY DEPT Provider Note   CSN: [id number] Arrival date & time: 03/19/16  1546     History   Chief Complaint Chief Complaint  Patient presents with  . Hernia    HPI Linda Vaughn is a 51 y.o. female.  51 yo F with a chief complaint of pain. from her umbilical hernia. This got much worse this morning. She had some nausea and vomiting associated with it. She went to her general surgeon's office he centered medially to the emergency department for likely surgical repair. She has been unable to tolerate by mouth since this morning. Describes the pain is sharp and severe. Denies radiation.   The history is provided by the patient.  Abdominal Pain   This is a new problem. The current episode started 6 to 12 hours ago. The problem occurs constantly. The problem has not changed since onset.The pain is located in the epigastric region. The pain is at a severity of 8/10. The pain is severe. Associated symptoms include nausea and vomiting. Pertinent negatives include fever, dysuria, headaches, arthralgias and myalgias. Nothing aggravates the symptoms. Nothing relieves the symptoms.    Past Medical History:  Diagnosis Date  . Arthritis   . Cirrhosis of liver (HCC)   . Gallstones   . Hepatitis C   . Hypertension     Patient Active Problem List   Diagnosis Date Noted  . Hepatic encephalopathy (HCC) 10/08/2015  . Generalized abdominal pain 10/08/2015  . Sepsis (HCC)   . SBP (spontaneous bacterial peritonitis) (HCC)   . Hepatic cirrhosis (HCC)   . Chronic hepatitis C without hepatic coma (HCC)   . Polysubstance abuse   . Ascites 05/24/2015  . Tobacco dependence 04/04/2015  . Umbilical hernia without obstruction and without gangrene 04/04/2015  . Chronic ankle pain 01/27/2015  . Obesity 01/27/2015  . Depression 01/27/2015  . Osteoarthritis 01/25/2015  . Cholelithiasis 04/20/2012  . HTN (hypertension) 04/20/2012  . Obesity (BMI 30.0-34.9) 04/20/2012    Past Surgical  History:  Procedure Laterality Date  . ANKLE SURGERY    . ANKLE SURGERY    . CARPAL TUNNEL RELEASE    . CESAREAN SECTION    . CHOLECYSTECTOMY      OB History    No data available       Home Medications    Prior to Admission medications   Medication Sig Start Date End Date Taking? Authorizing Provider  lactulose (CHRONULAC) 10 GM/15ML solution TAKE 15 MLS TWICE A DAY 03/18/16  Yes [name], FNP  oxycodone (OXY-IR) 5 MG capsule Take 1 capsule (5 mg total) by mouth every 6 (six) hours as needed. 03/12/16  Yes [name], FNP  spironolactone (ALDACTONE) 100 MG tablet TAKE 1 TABLET (100 MG TOTAL) BY MOUTH DAILY. 03/13/16  Yes [name], FNP  dicyclomine (BENTYL) 20 MG tablet Take 1 tablet (20 mg total) by mouth 2 (two) times daily. Patient not taking: Reported on 03/12/2016 02/25/16   Everlene Farrier, PA-C  ondansetron (ZOFRAN ODT) 4 MG disintegrating tablet Take 1 tablet (4 mg total) by mouth every 8 (eight) hours as needed for nausea or vomiting. Patient not taking: Reported on 02/25/2016 02/02/16   Renne Crigler, PA-C    Family History Family History  Problem Relation Age of Onset  . Cancer Mother   . Rheum arthritis Father   . Diabetes Other     Social History Social History  Substance Use Topics  . Smoking status: Current Every Day Smoker  Packs/day: 0.25  . Smokeless tobacco: Never Used  . Alcohol use No     Comment: occ     Allergies   Aspirin; Ibuprofen; and Tylenol [acetaminophen]   Review of Systems Review of Systems  Constitutional: Negative for chills and fever.  HENT: Negative for congestion and rhinorrhea.   Eyes: Negative for redness and visual disturbance.  Respiratory: Negative for shortness of breath and wheezing.   Cardiovascular: Negative for chest pain and palpitations.  Gastrointestinal: Positive for abdominal pain, nausea and vomiting.  Genitourinary: Negative for dysuria and urgency.  Musculoskeletal: Negative for arthralgias  and myalgias.  Skin: Negative for pallor and wound.  Neurological: Negative for dizziness and headaches.     Physical Exam Updated Vital Signs BP 113/73 (BP Location: Right Arm)   Pulse 64   Temp 98.2 F (36.8 C) (Oral)   Resp 18   SpO2 96%   Physical Exam  Constitutional: She is oriented to person, place, and time. She appears well-developed and well-nourished. No distress.  HENT:  Head: Normocephalic and atraumatic.  Eyes: EOM are normal. Pupils are equal, round, and reactive to light.  Neck: Normal range of motion. Neck supple.  Cardiovascular: Normal rate and regular rhythm.  Exam reveals no gallop and no friction rub.   No murmur heard. Pulmonary/Chest: Effort normal. She has no wheezes. She has no rales.  Abdominal: Soft. She exhibits mass. She exhibits no distension. There is tenderness. There is no guarding. A hernia (periumbilical) is present.  Musculoskeletal: She exhibits no edema or tenderness.  Neurological: She is alert and oriented to person, place, and time.  Skin: Skin is warm and dry. She is not diaphoretic.  Psychiatric: She has a normal mood and affect. Her behavior is normal.  Nursing note and vitals reviewed.    ED Treatments / Results  Labs (all labs ordered are listed, but only abnormal results are displayed) Labs Reviewed  CBC WITH DIFFERENTIAL/PLATELET - Abnormal; Notable for the following:       Result Value   RBC 3.33 (*)    HCT 33.7 (*)    MCV 101.2 (*)    MCH 36.3 (*)    Platelets 100 (*)    nRBC 2 (*)    Neutro Abs 1.6 (*)    All other components within normal limits  COMPREHENSIVE METABOLIC PANEL - Abnormal; Notable for the following:    Sodium 133 (*)    Calcium 8.2 (*)    Albumin 2.0 (*)    AST 85 (*)    Alkaline Phosphatase 131 (*)    Total Bilirubin 2.5 (*)    Anion gap 3 (*)    All other components within normal limits  I-STAT CHEM 8, ED - Abnormal; Notable for the following:    Calcium, Ion 1.12 (*)    All other  components within normal limits  LIPASE, BLOOD  I-STAT CG4 LACTIC ACID, ED    EKG  EKG Interpretation None       Radiology No results found.  Procedures Procedures (including critical care time)  Medications Ordered in ED Medications  sodium chloride 0.9 % bolus 1,000 mL (0 mLs Intravenous Stopped 03/19/16 1846)  fentaNYL (SUBLIMAZE) injection 100 mcg (100 mcg Intravenous Given 03/19/16 1622)  ondansetron (ZOFRAN) injection 4 mg (4 mg Intravenous Given 03/19/16 1623)     Initial Impression / Assessment and Plan / ED Course  I have reviewed the triage vital signs and the nursing notes.  Pertinent labs & imaging results that were  available during my care of the patient were reviewed by me and considered in my medical decision making (see chart for details).     51 yo F with a chief complaint of umbilical hernia pain. This been an ongoing issue but worsening today. Unable to keep anything down this morning. Saw surgeon was sent here for likely surgical repair. I'll give her pain medicine and fluids.  The patient was evaluated by general surgery.  They feel she is a poor candidate for elective surgery and were able to reduce at bedside without difficulty.    Evaluation with no specific finding.  I discussed with the patient that at this point the next diagnostic test should be a paracentesis for possible SBP.  Patient declined further eval, stated that she was not doing anything else unless it was fixing her hernia.  I discussed that she was not going to have surgery done here. She then refused other care and left prior to receiving her paperwork.  1:04 AM:  I have discussed the diagnosis/risks/treatment options with the patient and family and believe the pt to be eligible for discharge home to follow-up with PCP, gen surgery. We also discussed returning to the ED immediately if new or worsening sx occur. We discussed the sx which are most concerning (e.g., sudden worsening pain,  fever, inability to tolerate by mouth) that necessitate immediate return. Medications administered to the patient during their visit and any new prescriptions provided to the patient are listed below.  Medications given during this visit Medications  sodium chloride 0.9 % bolus 1,000 mL (0 mLs Intravenous Stopped 03/19/16 1846)  fentaNYL (SUBLIMAZE) injection 100 mcg (100 mcg Intravenous Given 03/19/16 1622)  ondansetron (ZOFRAN) injection 4 mg (4 mg Intravenous Given 03/19/16 1623)     The patient appears reasonably screen and/or stabilized for discharge and I doubt any other medical condition or other [facility] requiring further screening, evaluation, or treatment in the ED at this time prior to discharge.    Final Clinical Impressions(s) / ED Diagnoses   Final diagnoses:  Periumbilical hernia  Other ascites    New Prescriptions Discharge Medication List as of 03/19/2016  6:12 PM       Melene Plan, DO 03/20/16 0104

## 2016-03-19 NOTE — ED Triage Notes (Signed)
Per EMS[place] [facility] hernia. Pt complains of pain around umbilicus.   Dr. Michaell CowingGross spoke with surgeon on call to see.  BP 114/70 HR 80

## 2016-03-27 ENCOUNTER — Ambulatory Visit: Payer: Medicaid Other | Admitting: Family Medicine

## 2016-03-28 ENCOUNTER — Other Ambulatory Visit: Payer: Self-pay | Admitting: Family Medicine

## 2016-04-07 ENCOUNTER — Ambulatory Visit: Payer: Medicaid Other | Admitting: Family Medicine

## 2016-04-10 ENCOUNTER — Encounter: Payer: Self-pay | Admitting: Family Medicine

## 2016-04-10 ENCOUNTER — Ambulatory Visit (INDEPENDENT_AMBULATORY_CARE_PROVIDER_SITE_OTHER): Payer: Medicaid Other | Admitting: Family Medicine

## 2016-04-10 VITALS — BP 119/54 | HR 70 | Temp 98.4°F | Resp 18 | Ht 60.0 in | Wt 177.0 lb

## 2016-04-10 DIAGNOSIS — K7031 Alcoholic cirrhosis of liver with ascites: Secondary | ICD-10-CM | POA: Diagnosis not present

## 2016-04-10 DIAGNOSIS — B182 Chronic viral hepatitis C: Secondary | ICD-10-CM | POA: Diagnosis not present

## 2016-04-10 DIAGNOSIS — K429 Umbilical hernia without obstruction or gangrene: Secondary | ICD-10-CM | POA: Diagnosis not present

## 2016-04-10 DIAGNOSIS — F339 Major depressive disorder, recurrent, unspecified: Secondary | ICD-10-CM

## 2016-04-10 DIAGNOSIS — F17200 Nicotine dependence, unspecified, uncomplicated: Secondary | ICD-10-CM

## 2016-04-10 LAB — COMPLETE METABOLIC PANEL WITHOUT GFR
ALT: 43 U/L — ABNORMAL HIGH (ref 6–29)
AST: 86 U/L — ABNORMAL HIGH (ref 10–35)
Albumin: 2.1 g/dL — ABNORMAL LOW (ref 3.6–5.1)
Alkaline Phosphatase: 167 U/L — ABNORMAL HIGH (ref 33–130)
BUN: 11 mg/dL (ref 7–25)
CO2: 25 mmol/L (ref 20–31)
Calcium: 8.1 mg/dL — ABNORMAL LOW (ref 8.6–10.4)
Chloride: 107 mmol/L (ref 98–110)
Creat: 0.73 mg/dL (ref 0.50–1.05)
GFR, Est African American: >89 mL/min (ref >=60)
GFR, Est Non African American: >89 mL/min (ref >=60)
Glucose, Bld: 91 mg/dL (ref 65–99)
Potassium: 4.4 mmol/L (ref 3.5–5.3)
Sodium: 137 mmol/L (ref 135–146)
Total Bilirubin: 1.9 mg/dL — ABNORMAL HIGH (ref 0.2–1.2)
Total Protein: 7.3 g/dL (ref 6.1–8.1)

## 2016-04-10 LAB — AMMONIA: AMMONIA: 75 umol/L — AB (ref <=47)

## 2016-04-10 MED ORDER — DULOXETINE HCL 20 MG PO CPEP: 20.0000 mg | ORAL_CAPSULE | Freq: Two times a day (BID) | ORAL | 2 refills | Status: DC

## 2016-04-10 NOTE — Progress Notes (Signed)
Subjective:    Patient ID: Linda Vaughn, female    DOB: 26-Jun-1965, 50 y.o.   MRN: [id number]  HPI Linda Vaughn, a 51 year old female that presents to complaining of a wosening umbilical hernia. Patient is tearful and inconsolable on today. She says that her pain intensity is 10/10 constant and sore. Pain interferes with all activities. She says that she spends days in the bed.  Patient was evaluated in the emergency department on 03/19/2016 for generalized abdominal pain. She was evaluated last on 03/12/2016 and given a referral to general surgery for further evaluation. She had a console with  Dr. Star Age, general surgery on 03/19/2016. She had 10/10 pain on arrival to consult and was transported to the emergency department. Patient had a surgical consult that found her to be a poor surgical candidate for elective surgery. Dr. Adela Lank recommended a paracentesis for possible SBP. She declined further evaluation at the time. She refused further care at the hospital because she wanted to have surgery.  physical at hospital says that pain She is tearful, agitated and says that pain intensity is 10/10. She had an abdominal CT on 02/25/2016 that showed a 3.5 X 6.9 cm paraumbilical hernia containing ascitic fluid. She also has cirrhosis and has been lost to follow up with gastroenterology. I called  gastroenterology, who states that Linda Vaughn has been discharged from practice.  She states that pain often increases after eating large meals.  Aggravating factors include eating large meals. Associated symptoms include bloating and abdominal distention. The patient denies fever, fatigue, dysuria, nausea, vomiting, or constipation.   Past Medical History:  Diagnosis Date  . Arthritis   . Cirrhosis of liver (HCC)   . Gallstones   . Hepatitis C   . Hypertension    Past Surgical History:  Procedure Laterality Date  . ANKLE SURGERY    . ANKLE SURGERY    . CARPAL TUNNEL RELEASE    . CESAREAN  SECTION    . CHOLECYSTECTOMY     Social History   Social History  . Marital status: Single    Spouse name: N/A  . Number of children: N/A  . Years of education: N/A   Occupational History  . Not on file.   Social History Main Topics  . Smoking status: Current Every Day Smoker    Packs/day: 0.25  . Smokeless tobacco: Never Used  . Alcohol use No     Comment: occ  . Drug use: No  . Sexual activity: Not Currently   Other Topics Concern  . Not on file   Social History Narrative  . No narrative on file   There is no immunization history on file for this patient.  Allergies  Allergen Reactions  . Aspirin Other (See Comments)    Liver damage   . Ibuprofen Other (See Comments)    Liver damage  . Tylenol [Acetaminophen] Other (See Comments)    Liver damage   Review of Systems  Constitutional: Negative.  Negative for fatigue, fever and unexpected weight change.  HENT: Negative.   Eyes: Negative.  Negative for visual disturbance.  Respiratory: Negative.   Cardiovascular: Negative.   Gastrointestinal: Positive for abdominal distention and abdominal pain.  Endocrine: Negative.  Negative for polydipsia, polyphagia and polyuria.  Genitourinary: Negative.  Negative for difficulty urinating, dysuria and urgency.  Musculoskeletal: Negative.   Skin: Negative.   Allergic/Immunologic: Negative for immunocompromised state.  Neurological: Negative.   Hematological: Negative.   Psychiatric/Behavioral: Positive for  behavioral problems and sleep disturbance.       Depression       Objective:   Physical Exam  Constitutional: She is oriented to person, place, and time. She appears well-developed and well-nourished.  HENT:  Head: Normocephalic and atraumatic.  Right Ear: External ear normal.  Left Ear: External ear normal.  Nose: Nose normal.  Mouth/Throat: Oropharynx is clear and moist.  Eyes: Conjunctivae and EOM are normal. Pupils are equal, round, and reactive to light.   Neck: Normal range of motion. Neck supple.  Cardiovascular: Normal rate, regular rhythm and normal heart sounds.   Pulmonary/Chest: Effort normal and breath sounds normal.  Abdominal: Soft. Bowel sounds are normal. There is tenderness in the periumbilical area. A hernia is present.  Musculoskeletal: Normal range of motion.  Neurological: She is alert and oriented to person, place, and time. She has normal reflexes.  Skin: Skin is warm and dry.  Psychiatric: Her speech is normal. Judgment and thought content normal. Her affect is angry and labile. She is agitated and aggressive. She exhibits a depressed mood.     BP (!) 119/54 (BP Location: Left Arm, Patient Position: Sitting, Cuff Size: Normal)   Pulse 70   Temp 98.4 F (36.9 C) (Oral)   Resp 18   Ht 5' (1.524 m)   Wt 177 lb (80.3 kg)   SpO2 98%   BMI 34.57 kg/m  Assessment & Plan:  1. Umbilical hernia without obstruction and without gangrene Reviewed recent hospital notes and CT of the abdomen and pelvis. There is a periumbilical hernia defect asociated with a paraumbilical hernia.  Patient had a consult with general surgery, who states that she is not a surgical candidate at this time. Recommended a paracentesis for further evaluation during previous emergency room visit, patient refused. Discussed the importance of compliance at length. She expressed understanding.   2. Alcoholic cirrhosis of liver with ascites (HCC) There have been some probable compliance issues with follow up. I have discussed with her the great importance of following the treatment plan exactly as directed in order to achieve a good medical outcome. Will send a referral so that patient can reestablish with gastroenterology. Will check ammonia levels due to recent behavorial issues. She says that she has not been taking Lactulose consistently. However, she is taking Spironolactone consistently.  - AMMONIA 3. Episode of recurrent major depressive disorder,  unspecified depression episode severity [facility]) Depression screen [facility] 2/9 04/10/2016 04/10/2016 03/12/2016 04/04/2015 02/09/2015  Decreased Interest 0 0  Down, Depressed, Hopeless 3 0 0 0 0  PHQ - 2 Score 0 0  Altered sleeping 1 - - - -  Tired, decreased energy 3 - - - 0  Change in appetite 1 - - - 0  Feeling bad or failure about yourself  1 - - - -  Trouble concentrating 1 - - - 0  Moving slowly or fidgety/restless 0 - - - 0  Suicidal thoughts 0 - - - 0  PHQ-9 Score 13 - - - -  Difficult doing work/chores - - - - -   Patient has been lost to follow up with psychiatry, will send a referral.  - DULoxetine (CYMBALTA) 20 MG capsule; Take 1 capsule (20 mg total) by mouth 2 (two) times daily.  Dispense: 60 capsule; Refill: 2  4. Tobacco dependence Smoking cessation instruction/counseling given:  counseled patient on the dangers of tobacco use, advised patient to stop smoking, and reviewed strategies to maximize success  RTC: 1 month for a follow up of depression.    Nolon Nations  MSN, FNP-C [facility] [address] [place], [place] [address] [phone]

## 2016-04-11 ENCOUNTER — Other Ambulatory Visit: Payer: Self-pay | Admitting: Family Medicine

## 2016-04-11 DIAGNOSIS — K7031 Alcoholic cirrhosis of liver with ascites: Secondary | ICD-10-CM

## 2016-04-11 MED ORDER — LACTULOSE 10 GM/15ML PO SOLN: ORAL | 1 refills | Status: DC

## 2016-04-11 NOTE — Progress Notes (Signed)
Called and spoke with patient, advised of labs and that she needs to take her Lactulose twice daily as directed. Patient verbalized understanding and states she will get medication and take as directed. Advised that we have faxed in her Gastroenterology referral and to listen out for a call from their office to schedule an appointment. Patient had no other questions. Thanks!

## 2016-04-11 NOTE — Progress Notes (Signed)
    Reviewed labs, patient has an elevated ammonia level. Will start Lactulose 15 ml BID. Will also send a referral to gastroenterology for further workup and evaluation.   Meds ordered this encounter  Medications  . lactulose (CHRONULAC) 10 GM/15ML solution    Sig: TAKE 15 MLS TWICE A DAY    Dispense:  240 mL    Refill:  1    Keisean Skowron Rennis Petty  MSN, FNP-C [facility] [address] [place], [place] [address] [phone]

## 2016-04-12 NOTE — Patient Instructions (Addendum)
Will send a referral to gastroenterology for further work up and evaluation. Alcoholic Liver Disease The liver is an organ that converts food into energy, absorbs vitamins from food, removes toxins from the blood, and makes proteins. Alcoholic liver disease happens when the liver becomes damaged due to alcohol consumption and it stops working properly. What are the causes? This condition is caused by drinking too much alcohol over a number of years. What increases the risk? This condition is more likely to develop in:  Women.  People who have a family history of the disease.  People who have poor nutrition.  People who are obese. What are the signs or symptoms? Early symptoms of this condition include:  Fatigue.  Weakness.  Abdominal discomfort on the upper right side. Symptoms of moderate disease include:  Fever.  Yellow, pale, or darkening skin.  Abdominal pain.  Nausea and vomiting.  Weight loss.  Loss of appetite. Symptoms of advanced disease include:  Abdominal swelling.  Nosebleeds or bleeding gums.  Itchy skin.  Enlarged fingertips (clubbing).  Light-colored stools that smell very bad (steatorrhea).  Mood changes.  Feeling agitated.  Confusion.  Trouble concentrating. Some people do not have symptoms until the condition becomes severe. Symptoms are often worse after heavy drinking. How is this diagnosed? This condition is diagnosed with:  A physical exam.  Blood tests.  Taking a sample of liver tissue to examine under a microscope (liver biopsy). You may also have other tests, including:  X-rays.  Ultrasound. How is this treated? Treatment may include:  Stopping alcohol use to allow the liver to heal.  Joining a support group or meeting with a counselor.  Medicines to reduce inflammation. These may be recommended if the disease is severe.  A liver transplant. This is the only treatment if the disease is very severe.  Nutritional  therapy. This may involve:  Taking vitamins.  Eating foods that are high in thiamine, such as whole-wheat cereals, pork, and raw vegetables.  Eating foods that have a lot of folic acid, such as vegetables, fruits, meats, beans, nuts, and dairy foods.  Eating a diet that includes carbohydrate-rich foods, such as yogurt, beans, potatoes, and rice. Follow these instructions at home:  Do not drink alcohol.  Take medicines only as directed by your health care provider.  Take vitamins only as directed by your health care provider.  Follow any diet instructions that are given to you by your health care provider. Contact a health care provider if:  You have a fever.  You have shortness of breath or have difficulty breathing.  You have bright red blood in your stool, or you have black, tarry stools.  You are vomiting blood.  Your skin color becomes more yellow, pale, or dark.  You develop headaches.  You have trouble thinking.  You have problems balancing or walking. This information is not intended to replace advice given to you by your health care provider. Make sure you discuss any questions you have with your health care provider. Document Released: 01/13/2014 Document Revised: 05/31/2015 Document Reviewed: 11/24/2013 Elsevier Interactive Patient Education  2017 [employer]  Cirrhosis Cirrhosis is long-term (chronic) liver injury. The liver is your largest internal organ, and it performs many functions. The liver converts food into energy, removes toxic material from your blood, makes important proteins, and absorbs necessary vitamins from your diet. If you have cirrhosis, it means many of your healthy liver cells have been replaced by scar tissue. This prevents blood from flowing through  your liver, which makes it difficult for your liver to function. This scarring is not reversible, but treatment can prevent it from getting worse. What are the causes? Hepatitis C and  long-term alcohol abuse are the most common causes of cirrhosis. Other causes include:  Nonalcoholic fatty liver disease.  Hepatitis B infection.  Autoimmune hepatitis.  Diseases that cause blockage of ducts inside the liver.  Inherited liver diseases.  Reactions to certain long-term medicines.  Parasitic infections.  Long-term exposure to certain toxins. What increases the risk? You may have a higher risk of cirrhosis if you:  Have certain hepatitis viruses.  Abuse alcohol, especially if you are female.  Are overweight.  Share needles.  Have unprotected sex with someone who has hepatitis. What are the signs or symptoms? You may not have any signs and symptoms at first. Symptoms may not develop until the damage to your liver starts to get worse. Signs and symptoms of cirrhosis may include:  Tenderness in the right-upper part of your abdomen.  Weakness and tiredness (fatigue).  Loss of appetite.  Nausea.  Weight loss and muscle loss.  Itchiness.  Yellow skin and eyes (jaundice).  Buildup of fluid in the abdomen (ascites).  Swelling of the feet and ankles (edema).  Appearance of tiny blood vessels under the skin.  Mental confusion.  Easy bruising and bleeding. How is this diagnosed? Your health care provider may suspect cirrhosis based on your symptoms and medical history, especially if you have other medical conditions or a history of alcohol abuse. Your health care provider will do a physical exam to feel your liver and check for signs of cirrhosis. Your health care provider may perform other tests, including:  Blood tests to check:  Whether you have hepatitis B or C.  Kidney function.  Liver function.  Imaging tests such as:  MRI or CT scan to look for changes seen in advanced cirrhosis.  Ultrasound to see if normal liver tissue is being replaced by scar tissue.  A procedure using a long needle to take a sample of liver tissue (biopsy) for  examination under a microscope. Liver biopsy can confirm the diagnosis of cirrhosis. How is this treated? Treatment depends on how damaged your liver is and what caused the damage. Treatment may include treating cirrhosis symptoms or treating the underlying causes of the condition to try to slow the progression of the damage. Treatment may include:  Making lifestyle changes, such as:  Eating a healthy diet.  Restricting salt intake.  Maintaining a healthy weight.  Not abusing drugs or alcohol.  Taking medicines to:  Treat liver infections or other infections.  Control itching.  Reduce fluid buildup.  Reduce certain blood toxins.  Reduce risk of bleeding from enlarged blood vessels in the stomach or esophagus (varices).  If varices are causing bleeding problems, you may need treatment with a procedure that ties up the vessels causing them to fall off (band ligation).  If cirrhosis is causing your liver to fail, your health care provider may recommend a liver transplant.  Other treatments may be recommended depending on any complications of cirrhosis, such as liver-related kidney failure (hepatorenal syndrome). Follow these instructions at home:  Take medicines only as directed by your health care provider. Do not use drugs that are toxic to your liver. Ask your health care provider before taking any new medicines, including over-the-counter medicines.  Rest as needed.  Eat a well-balanced diet. Ask your health care provider or dietitian for more information.  You may have to follow a low-salt diet or restrict your water intake as directed.  Do not drink alcohol. This is especially important if you are taking acetaminophen.  Keep all follow-up visits as directed by your health care provider. This is important. Contact a health care provider if:  You have fatigue or weakness that is getting worse.  You develop swelling of the hands, feet, legs, or face.  You have a  fever.  You develop loss of appetite.  You have nausea or vomiting.  You develop jaundice.  You develop easy bruising or bleeding. Get help right away if:  You vomit bright red blood or a material that looks like coffee grounds.  You have blood in your stools.  Your stools appear black and tarry.  You become confused.  You have chest pain or trouble breathing. This information is not intended to replace advice given to you by your health care provider. Make sure you discuss any questions you have with your health care provider. Document Released: 12/23/2004 Document Revised: 05/03/2015 Document Reviewed: 08/31/2013 Elsevier Interactive Patient Education  2017 [employer].

## 2016-04-14 ENCOUNTER — Telehealth: Payer: Self-pay | Admitting: Hematology

## 2016-04-14 NOTE — Telephone Encounter (Signed)
Lisa from [facility] calling to advise they have not been able to get in contact with patient in regards to referral.

## 2016-04-21 ENCOUNTER — Emergency Department (HOSPITAL_COMMUNITY): Payer: Medicaid Other

## 2016-04-21 ENCOUNTER — Encounter (HOSPITAL_COMMUNITY): Payer: Self-pay | Admitting: Emergency Medicine

## 2016-04-21 ENCOUNTER — Emergency Department (HOSPITAL_COMMUNITY)
Admission: EM | Admit: 2016-04-21 | Discharge: 2016-04-22 | Disposition: A | Discharge status: Home / Self Care | Payer: Medicaid Other | Attending: Emergency Medicine | Admitting: Emergency Medicine

## 2016-04-21 DIAGNOSIS — Z79899 Other long term (current) drug therapy: Secondary | ICD-10-CM | POA: Diagnosis not present

## 2016-04-21 DIAGNOSIS — R1084 Generalized abdominal pain: Secondary | ICD-10-CM | POA: Insufficient documentation

## 2016-04-21 DIAGNOSIS — F17200 Nicotine dependence, unspecified, uncomplicated: Secondary | ICD-10-CM | POA: Insufficient documentation

## 2016-04-21 DIAGNOSIS — I10 Essential (primary) hypertension: Secondary | ICD-10-CM | POA: Diagnosis not present

## 2016-04-21 DIAGNOSIS — R1011 Right upper quadrant pain: Secondary | ICD-10-CM | POA: Diagnosis present

## 2016-04-21 LAB — COMPREHENSIVE METABOLIC PANEL
ALBUMIN: 2.2 g/dL — AB (ref 3.5–5.0)
ALK PHOS: 167 U/L — AB (ref 38–126)
ALT: 53 U/L (ref 14–54)
ANION GAP: 9 (ref 5–15)
AST: 104 U/L — ABNORMAL HIGH (ref 15–41)
BUN: 10 mg/dL (ref 6–20)
CHLORIDE: 105 mmol/L (ref 101–111)
CO2: 22 mmol/L (ref 22–32)
Calcium: 8.6 mg/dL — ABNORMAL LOW (ref 8.9–10.3)
Creatinine, Ser: 0.8 mg/dL (ref 0.44–1.00)
GFR calc Af Amer: >60 mL/min (ref >60)
GFR calc non Af Amer: >60 mL/min (ref >60)
Glucose, Bld: 97 mg/dL (ref 65–99)
POTASSIUM: 3.5 mmol/L (ref 3.5–5.1)
SODIUM: 136 mmol/L (ref 135–145)
Total Bilirubin: 2.3 mg/dL — ABNORMAL HIGH (ref 0.3–1.2)
Total Protein: 8.2 g/dL — ABNORMAL HIGH (ref 6.5–8.1)

## 2016-04-21 LAB — CBC
HEMATOCRIT: 37 % (ref 36.0–46.0)
HEMOGLOBIN: 13 g/dL (ref 12.0–15.0)
MCH: 36 pg — AB (ref 26.0–34.0)
MCHC: 35.1 g/dL (ref 30.0–36.0)
MCV: 102.5 fL — AB (ref 78.0–100.0)
Platelets: 99 10*3/uL — ABNORMAL LOW (ref 150–400)
RBC: 3.61 MIL/uL — ABNORMAL LOW (ref 3.87–5.11)
RDW: 14.9 % (ref 11.5–15.5)
WBC: 6.2 10*3/uL (ref 4.0–10.5)

## 2016-04-21 LAB — LIPASE, BLOOD: LIPASE: 20 U/L (ref 11–51)

## 2016-04-21 MED ORDER — ONDANSETRON HCL 4 MG/2ML IJ SOLN
4.0000 mg | Freq: Once | INTRAMUSCULAR | Status: AC | PRN
  Administered 2016-04-21: 4 mg via INTRAVENOUS
  Filled 2016-04-21: qty 2

## 2016-04-21 MED ORDER — IOPAMIDOL (ISOVUE-300) INJECTION 61%
INTRAVENOUS | Status: AC
  Administered 2016-04-21: 100 mL
  Filled 2016-04-21: qty 100

## 2016-04-21 MED ORDER — FENTANYL CITRATE (PF) 100 MCG/2ML IJ SOLN
50.0000 ug | INTRAMUSCULAR | Status: AC | PRN
  Administered 2016-04-21 (×2): 50 ug via INTRAVENOUS
  Filled 2016-04-21 (×2): qty 2

## 2016-04-21 MED ORDER — LIDOCAINE-EPINEPHRINE (PF) 2 %-1:200000 IJ SOLN
10.0000 mL | Freq: Once | INTRAMUSCULAR | Status: AC
  Administered 2016-04-21: 10 mL via INTRADERMAL
  Filled 2016-04-21: qty 20

## 2016-04-21 NOTE — ED Provider Notes (Signed)
[facility]ENCY DEPT Provider Note   CSN: [id number] Arrival date & time: 04/21/16  2052     History   Chief Complaint Chief Complaint  Patient presents with  . Abdominal Pain    HPI Linda Vaughn is a 51 y.o. female.  The history is provided by the patient and medical records.  Abdominal Pain   This is a new problem. The current episode started 1 to 2 hours ago. The problem occurs constantly. The problem has not changed since onset.Associated with: started shortly after dinner. The pain is located in the RUQ and periumbilical region. Quality: Unable to describe. The pain is severe. Associated symptoms include fever (subjective) and vomiting. Pertinent negatives include diarrhea, dysuria, frequency, hematuria and arthralgias. The symptoms are aggravated by palpation. Nothing relieves the symptoms. Past medical history comments: Hep C w/cirrhosis, ventral hernia.    Past Medical History:  Diagnosis Date  . Arthritis   . Cirrhosis of liver (HCC)   . Gallstones   . Hepatitis C   . Hypertension     Patient Active Problem List   Diagnosis Date Noted  . Hepatic encephalopathy (HCC) 10/08/2015  . Generalized abdominal pain 10/08/2015  . Sepsis (HCC)   . SBP (spontaneous bacterial peritonitis) (HCC)   . Hepatic cirrhosis (HCC)   . Chronic hepatitis C without hepatic coma (HCC)   . Polysubstance abuse   . Ascites 05/24/2015  . Tobacco dependence 04/04/2015  . Umbilical hernia without obstruction and without gangrene 04/04/2015  . Chronic ankle pain 01/27/2015  . Obesity 01/27/2015  . Depression 01/27/2015  . Osteoarthritis 01/25/2015  . Cholelithiasis 04/20/2012  . HTN (hypertension) 04/20/2012  . Obesity (BMI 30.0-34.9) 04/20/2012    Past Surgical History:  Procedure Laterality Date  . ANKLE SURGERY    . ANKLE SURGERY    . CARPAL TUNNEL RELEASE    . CESAREAN SECTION    . CHOLECYSTECTOMY      OB History    No data available       Home Medications    Prior  to Admission medications   Medication Sig Start Date End Date Taking? Authorizing Provider  lactulose (CHRONULAC) 10 GM/15ML solution TAKE 15 MLS TWICE A DAY 04/11/16  Yes [name], FNP  ondansetron (ZOFRAN ODT) 4 MG disintegrating tablet Take 1 tablet (4 mg total) by mouth every 8 (eight) hours as needed for nausea or vomiting. 02/02/16  Yes Renne Crigler, PA-C  oxycodone (OXY-IR) 5 MG capsule Take 1 capsule (5 mg total) by mouth every 6 (six) hours as needed. Patient taking differently: Take 5 mg by mouth every 6 (six) hours as needed.  03/12/16  Yes [name], FNP  spironolactone (ALDACTONE) 100 MG tablet TAKE 1 TABLET (100 MG TOTAL) BY MOUTH DAILY. 03/13/16  Yes [name], FNP  DULoxetine (CYMBALTA) 20 MG capsule Take 1 capsule (20 mg total) by mouth 2 (two) times daily. Patient not taking: Reported on 04/21/2016 04/10/16   [name], FNP    Family History Family History  Problem Relation Age of Onset  . Cancer Mother   . Rheum arthritis Father   . Diabetes Other     Social History Social History  Substance Use Topics  . Smoking status: Current Every Day Smoker    Packs/day: 0.25  . Smokeless tobacco: Never Used  . Alcohol use No     Comment: occ     Allergies   Aspirin; Ibuprofen; and Tylenol [acetaminophen]   Review of Systems Review  of Systems  Constitutional: Positive for chills and fever (subjective).  HENT: Negative for ear pain and sore throat.   Eyes: Negative for pain and visual disturbance.  Respiratory: Negative for cough and shortness of breath.   Cardiovascular: Negative for chest pain and palpitations.  Gastrointestinal: Positive for abdominal pain and vomiting. Negative for diarrhea.  Genitourinary: Negative for dysuria, frequency and hematuria.  Musculoskeletal: Negative for arthralgias and back pain.  Skin: Negative for color change and rash.  Neurological: Negative for seizures and syncope.  All other systems reviewed and are  negative.    Physical Exam Updated Vital Signs BP 103/68   Pulse 77   Temp 99 F (37.2 C) (Oral)   Resp (!) 21   Ht 5' (1.524 m)   Wt 80.3 kg   SpO2 95%   BMI 34.57 kg/m   Physical Exam  Constitutional: She is oriented to person, place, and time. She appears well-developed and well-nourished.  Crying, appears to be in pain  HENT:  Head: Normocephalic and atraumatic.  Eyes: Conjunctivae are normal.  Neck: Neck supple.  Cardiovascular: Normal rate and regular rhythm.   No murmur heard. Pulmonary/Chest: Effort normal and breath sounds normal. No respiratory distress.  Abdominal: Soft. There is tenderness (RUQ & on ventral hernia). A hernia is present.  Ventral hernia soft but very TTP  Musculoskeletal: She exhibits no edema.  Neurological: She is alert and oriented to person, place, and time.  Skin: Skin is warm and dry.  Psychiatric: She has a normal mood and affect.  Nursing note and vitals reviewed.    ED Treatments / Results  Labs (all labs ordered are listed, but only abnormal results are displayed) Labs Reviewed  COMPREHENSIVE METABOLIC PANEL - Abnormal; Notable for the following:       Result Value   Calcium 8.6 (*)    Total Protein 8.2 (*)    Albumin 2.2 (*)    AST 104 (*)    Alkaline Phosphatase 167 (*)    Total Bilirubin 2.3 (*)    All other components within normal limits  CBC - Abnormal; Notable for the following:    RBC 3.61 (*)    MCV 102.5 (*)    MCH 36.0 (*)    Platelets 99 (*)    All other components within normal limits  URINALYSIS, ROUTINE W REFLEX MICROSCOPIC - Abnormal; Notable for the following:    APPearance HAZY (*)    Leukocytes, UA TRACE (*)    Bacteria, UA RARE (*)    Squamous Epithelial / LPF 6-30 (*)    All other components within normal limits  BODY FLUID CELL COUNT WITH DIFFERENTIAL - Abnormal; Notable for the following:    Appearance, Fluid HAZY (*)    Neutrophil Count, Fluid 51 (*)    Monocyte-Macrophage-Serous Fluid 39  (*)    All other components within normal limits  BODY FLUID CULTURE  LIPASE, BLOOD  GLUCOSE, PLEURAL OR PERITONEAL FLUID  PROTEIN, PLEURAL OR PERITONEAL FLUID  PATHOLOGIST SMEAR REVIEW    EKG  EKG Interpretation None       Radiology No results found.  Procedures .Paracentesis Date/Time: 04/22/2016 12:34 AM Performed by: Forest Becker Authorized by: Laurence Spates   Consent:    Consent obtained:  Verbal   Consent given by:  Patient   Risks discussed:  Bleeding, bowel perforation, infection and pain   Alternatives discussed:  No treatment Pre-procedure details:    Procedure purpose:  Diagnostic   Preparation: Patient was prepped  and draped in usual sterile fashion   Anesthesia (see MAR for exact dosages):    Anesthesia method:  Local infiltration   Local anesthetic:  Lidocaine 2% WITH epi Procedure details:    Needle gauge:  20   Ultrasound guidance: yes     Puncture site: Right paracolic gutter.   Fluid removed amount:  15cc   Fluid appearance:  Amber and clear   Dressing:  4x4 sterile gauze and adhesive bandage Post-procedure details:    Patient tolerance of procedure:  Tolerated well, no immediate complications   (including critical care time)  Medications Ordered in ED Medications  ondansetron (ZOFRAN) injection 4 mg (4 mg Intravenous Given 04/21/16 2130)  fentaNYL (SUBLIMAZE) injection 50 mcg (50 mcg Intravenous Given 04/21/16 2337)  iopamidol (ISOVUE-300) 61 % injection (100 mLs  Contrast Given 04/21/16 2238)  lidocaine-EPINEPHrine (XYLOCAINE W/EPI) 2 %-1:200000 (PF) injection 10 mL (10 mLs Intradermal Given 04/21/16 2337)     Initial Impression / Assessment and Plan / ED Course  I have reviewed the triage vital signs and the nursing notes.  Pertinent labs & imaging results that were available during my care of the patient were reviewed by me and considered in my medical decision making (see chart for details).    Pt with h/o HepC w/cirrhosis,  HTN presents with abdominal pain. Says the pain started shortly after eating dinner tonight, is localized to the RUQ and her hernia, and has been associated with several episodes of emesis that she says were streaked w/some blood and subjective F/C. Says prior to tonight's episode, she was in her normal state of health w/o complaints.  VS & exam as above. IV pain medication given. Labs remarkable for AlkPhos 167, AST 104, Tbili 2.3; all appear near baseline. CT A&P w/mild proximal transverse colon wall thickening concerning for mild infectious/inflammatory process & mild ascites.  Diagnostic paracentesis performed at the bedside; body fluid analysis not consistent w/SBP.  Will discharge the Pt home. Recommending follow-up with PCP. ED return precautions provided. Pt acknowledged understanding of, and concurrence with the plan. All questions answered to her satisfaction. In stable condition at the time of discharge.  Final Clinical Impressions(s) / ED Diagnoses   Final diagnoses:  Generalized abdominal pain    New Prescriptions Discharge Medication List as of 04/22/2016  2:25 AM       Forest Becker, MD 04/24/16 1424    Laurence Spates, MD 04/29/16 2100

## 2016-04-21 NOTE — ED Triage Notes (Signed)
Pt BIB EMS from home. Has had hernia for over 77yr with pain "off and on". States she was eating dinner tonight when she began having strong onset of pain to same area. Reports emesis 2x, food with "little streaks of blood". Pt tearful upon presentation. Lower abdomen distended with incr'd swelling than normal.

## 2016-04-22 LAB — URINALYSIS, ROUTINE W REFLEX MICROSCOPIC
Bilirubin Urine: NEGATIVE
GLUCOSE, UA: NEGATIVE mg/dL
Hgb urine dipstick: NEGATIVE
Ketones, ur: NEGATIVE mg/dL
Nitrite: NEGATIVE
PROTEIN: NEGATIVE mg/dL
SPECIFIC GRAVITY, URINE: 1.026 (ref 1.005–1.030)
pH: 6 (ref 5.0–8.0)

## 2016-04-22 LAB — BODY FLUID CELL COUNT WITH DIFFERENTIAL
Lymphs, Fluid: 10 %
Monocyte-Macrophage-Serous Fluid: 39 % — ABNORMAL LOW (ref 50–90)
Neutrophil Count, Fluid: 51 % — ABNORMAL HIGH (ref 0–25)
WBC FLUID: 520 uL (ref 0–1000)

## 2016-04-22 LAB — PATHOLOGIST SMEAR REVIEW

## 2016-04-22 LAB — GLUCOSE, PLEURAL OR PERITONEAL FLUID: Glucose, Fluid: 99 mg/dL

## 2016-04-22 LAB — PROTEIN, PLEURAL OR PERITONEAL FLUID

## 2016-04-22 NOTE — Discharge Instructions (Addendum)
Your peritoneal fluid did not show any signs of infection. A culture is pending and if any bacterial growth from this you will be contacted. Please follow-up with your primary care provider.

## 2016-04-25 LAB — BODY FLUID CULTURE
Culture: NO GROWTH
SPECIAL REQUESTS: NORMAL

## 2016-05-26 ENCOUNTER — Other Ambulatory Visit: Payer: Self-pay | Admitting: Family Medicine

## 2016-06-23 ENCOUNTER — Other Ambulatory Visit: Payer: Self-pay | Admitting: Family Medicine

## 2016-06-23 DIAGNOSIS — K7031 Alcoholic cirrhosis of liver with ascites: Secondary | ICD-10-CM

## 2016-08-04 ENCOUNTER — Other Ambulatory Visit: Payer: Self-pay | Admitting: Family Medicine

## 2016-08-27 ENCOUNTER — Other Ambulatory Visit: Payer: Self-pay | Admitting: Family Medicine

## 2016-09-01 ENCOUNTER — Other Ambulatory Visit: Payer: Self-pay | Admitting: Family Medicine

## 2016-09-01 DIAGNOSIS — K7031 Alcoholic cirrhosis of liver with ascites: Secondary | ICD-10-CM

## 2016-09-18 ENCOUNTER — Other Ambulatory Visit: Payer: Self-pay | Admitting: Family Medicine

## 2016-10-04 ENCOUNTER — Emergency Department (HOSPITAL_COMMUNITY): Payer: Medicaid Other

## 2016-10-04 ENCOUNTER — Encounter (HOSPITAL_COMMUNITY): Payer: Self-pay | Admitting: *Deleted

## 2016-10-04 ENCOUNTER — Emergency Department (HOSPITAL_COMMUNITY)
Admission: EM | Admit: 2016-10-04 | Discharge: 2016-10-04 | Disposition: A | Discharge status: Home / Self Care | Payer: Medicaid Other | Attending: Emergency Medicine | Admitting: Emergency Medicine

## 2016-10-04 DIAGNOSIS — I10 Essential (primary) hypertension: Secondary | ICD-10-CM | POA: Insufficient documentation

## 2016-10-04 DIAGNOSIS — K529 Noninfective gastroenteritis and colitis, unspecified: Secondary | ICD-10-CM | POA: Diagnosis not present

## 2016-10-04 DIAGNOSIS — R1084 Generalized abdominal pain: Secondary | ICD-10-CM | POA: Diagnosis present

## 2016-10-04 DIAGNOSIS — F17200 Nicotine dependence, unspecified, uncomplicated: Secondary | ICD-10-CM | POA: Diagnosis not present

## 2016-10-04 DIAGNOSIS — R188 Other ascites: Secondary | ICD-10-CM

## 2016-10-04 LAB — COMPREHENSIVE METABOLIC PANEL
ALT: 50 U/L (ref 14–54)
AST: 92 U/L — ABNORMAL HIGH (ref 15–41)
Albumin: 1.9 g/dL — ABNORMAL LOW (ref 3.5–5.0)
Alkaline Phosphatase: 150 U/L — ABNORMAL HIGH (ref 38–126)
Anion gap: 6 (ref 5–15)
BUN: 10 mg/dL (ref 6–20)
CO2: 19 mmol/L — ABNORMAL LOW (ref 22–32)
Calcium: 8.2 mg/dL — ABNORMAL LOW (ref 8.9–10.3)
Chloride: 107 mmol/L (ref 101–111)
Creatinine, Ser: 0.93 mg/dL (ref 0.44–1.00)
GFR calc Af Amer: >60 mL/min (ref >60)
GFR calc non Af Amer: >60 mL/min (ref >60)
Glucose, Bld: 117 mg/dL — ABNORMAL HIGH (ref 65–99)
Potassium: 3.5 mmol/L (ref 3.5–5.1)
Sodium: 132 mmol/L — ABNORMAL LOW (ref 135–145)
Total Bilirubin: 2.9 mg/dL — ABNORMAL HIGH (ref 0.3–1.2)
Total Protein: 7.9 g/dL (ref 6.5–8.1)

## 2016-10-04 LAB — CBC
HCT: 35.7 % — ABNORMAL LOW (ref 36.0–46.0)
Hemoglobin: 12.7 g/dL (ref 12.0–15.0)
MCH: 35.6 pg — AB (ref 26.0–34.0)
MCHC: 35.6 g/dL (ref 30.0–36.0)
MCV: 100 fL (ref 78.0–100.0)
PLATELETS: 131 10*3/uL — AB (ref 150–400)
RBC: 3.57 MIL/uL — AB (ref 3.87–5.11)
RDW: 15.8 % — AB (ref 11.5–15.5)
WBC: 4.5 10*3/uL (ref 4.0–10.5)

## 2016-10-04 LAB — URINALYSIS, ROUTINE W REFLEX MICROSCOPIC
Bacteria, UA: NONE SEEN
Bilirubin Urine: NEGATIVE
Glucose, UA: NEGATIVE mg/dL
Hgb urine dipstick: NEGATIVE
Ketones, ur: NEGATIVE mg/dL
Nitrite: NEGATIVE
PH: 5 (ref 5.0–8.0)
Protein, ur: 30 mg/dL — AB
SPECIFIC GRAVITY, URINE: 1.023 (ref 1.005–1.030)
WBC, UA: NONE SEEN WBC/hpf (ref 0–5)

## 2016-10-04 LAB — LIPASE, BLOOD: Lipase: 37 U/L (ref 11–51)

## 2016-10-04 MED ORDER — SODIUM CHLORIDE 0.9 % IV BOLUS (SEPSIS)
1000.0000 mL | Freq: Once | INTRAVENOUS | Status: AC
  Administered 2016-10-04: 1000 mL via INTRAVENOUS

## 2016-10-04 MED ORDER — CIPROFLOXACIN HCL 500 MG PO TABS: 500.0000 mg | ORAL_TABLET | Freq: Two times a day (BID) | ORAL | 0 refills | Status: DC

## 2016-10-04 MED ORDER — IOPAMIDOL (ISOVUE-300) INJECTION 61%
INTRAVENOUS | Status: AC
  Administered 2016-10-04: 100 mL
  Filled 2016-10-04: qty 100

## 2016-10-04 MED ORDER — OXYCODONE HCL 5 MG PO TABS: 5.0000 mg | ORAL_TABLET | ORAL | 0 refills | Status: DC | PRN

## 2016-10-04 MED ORDER — CIPROFLOXACIN IN D5W 400 MG/200ML IV SOLN
400.0000 mg | Freq: Once | INTRAVENOUS | Status: AC
  Administered 2016-10-04: 400 mg via INTRAVENOUS
  Filled 2016-10-04: qty 200

## 2016-10-04 MED ORDER — HYDROMORPHONE HCL 1 MG/ML IJ SOLN
1.0000 mg | Freq: Once | INTRAMUSCULAR | Status: AC
  Administered 2016-10-04: 1 mg via INTRAVENOUS
  Filled 2016-10-04: qty 1

## 2016-10-04 MED ORDER — DOCUSATE SODIUM 100 MG PO CAPS: 100.0000 mg | ORAL_CAPSULE | Freq: Two times a day (BID) | ORAL | 0 refills | Status: DC

## 2016-10-04 MED ORDER — METRONIDAZOLE IN NACL 5-0.79 MG/ML-% IV SOLN
500.0000 mg | Freq: Once | INTRAVENOUS | Status: AC
  Administered 2016-10-04: 500 mg via INTRAVENOUS
  Filled 2016-10-04: qty 100

## 2016-10-04 MED ORDER — ONDANSETRON HCL 4 MG/2ML IJ SOLN
4.0000 mg | Freq: Once | INTRAMUSCULAR | Status: AC
  Administered 2016-10-04: 4 mg via INTRAVENOUS
  Filled 2016-10-04: qty 2

## 2016-10-04 MED ORDER — METRONIDAZOLE 500 MG PO TABS: 500.0000 mg | ORAL_TABLET | Freq: Two times a day (BID) | ORAL | 0 refills | Status: DC

## 2016-10-04 MED ORDER — OXYCODONE HCL 5 MG PO TABS
5.0000 mg | ORAL_TABLET | Freq: Once | ORAL | Status: AC
  Administered 2016-10-04: 5 mg via ORAL
  Filled 2016-10-04: qty 1

## 2016-10-04 MED ORDER — ONDANSETRON HCL 4 MG PO TABS: 4.0000 mg | ORAL_TABLET | Freq: Three times a day (TID) | ORAL | 0 refills | Status: DC | PRN

## 2016-10-04 NOTE — ED Provider Notes (Signed)
[facility] Provider Note   CSN: [id number] Arrival date & time: 10/04/16  0100     History   Chief Complaint Chief Complaint  Patient presents with  . Abdominal Pain    HPI Linda Vaughn is a 51 y.o. female.   Abdominal Pain   This is a chronic problem. The current episode started more than 2 days ago. The problem occurs constantly. The problem has not changed since onset.The pain is located in the generalized abdominal region.    Past Medical History:  Diagnosis Date  . Arthritis   . Cirrhosis of liver (HCC)   . Gallstones   . Hepatitis C   . Hypertension     Patient Active Problem List   Diagnosis Date Noted  . Hepatic encephalopathy (HCC) 10/08/2015  . Generalized abdominal pain 10/08/2015  . Sepsis (HCC)   . SBP (spontaneous bacterial peritonitis) (HCC)   . Hepatic cirrhosis (HCC)   . Chronic hepatitis C without hepatic coma (HCC)   . Polysubstance abuse   . Ascites 05/24/2015  . Tobacco dependence 04/04/2015  . Umbilical hernia without obstruction and without gangrene 04/04/2015  . Chronic ankle pain 01/27/2015  . Obesity 01/27/2015  . Depression 01/27/2015  . Osteoarthritis 01/25/2015  . Cholelithiasis 04/20/2012  . HTN (hypertension) 04/20/2012  . Obesity (BMI 30.0-34.9) 04/20/2012    Past Surgical History:  Procedure Laterality Date  . ANKLE SURGERY    . ANKLE SURGERY    . CARPAL TUNNEL RELEASE    . CESAREAN SECTION    . CHOLECYSTECTOMY      OB History    No data available       Home Medications    Prior to Admission medications   Medication Sig Start Date End Date Taking? Authorizing Provider  ondansetron (ZOFRAN ODT) 4 MG disintegrating tablet Take 1 tablet (4 mg total) by mouth every 8 (eight) hours as needed for nausea or vomiting. 02/02/16  Yes Renne Crigler, PA-C  ciprofloxacin (CIPRO) 500 MG tablet Take 1 tablet (500 mg total) by mouth 2 (two) times daily. One po bid x 7 days 10/04/16   Adiel Mcnamara, Barbara Cower, MD  docusate  sodium (COLACE) 100 MG capsule Take 1 capsule (100 mg total) by mouth every 12 (twelve) hours. 10/04/16   Recardo Linn, Barbara Cower, MD  metroNIDAZOLE (FLAGYL) 500 MG tablet Take 1 tablet (500 mg total) by mouth 2 (two) times daily. One po bid x 7 days 10/04/16   Areana Kosanke, Barbara Cower, MD  ondansetron (ZOFRAN) 4 MG tablet Take 1 tablet (4 mg total) by mouth every 8 (eight) hours as needed for nausea or vomiting. 10/04/16   Yeni Jiggetts, Barbara Cower, MD  oxyCODONE (ROXICODONE) 5 MG immediate release tablet Take 1 tablet (5 mg total) by mouth every 4 (four) hours as needed for severe pain. 10/04/16   Cristy Colmenares, Barbara Cower, MD    Family History Family History  Problem Relation Age of Onset  . Cancer Mother   . Rheum arthritis Father   . Diabetes Other     Social History Social History  Substance Use Topics  . Smoking status: Current Every Day Smoker    Packs/day: 0.25  . Smokeless tobacco: Never Used  . Alcohol use No     Comment: occ     Allergies   Aspirin; Ibuprofen; and Tylenol [acetaminophen]   Review of Systems Review of Systems  Gastrointestinal: Positive for abdominal pain.  All other systems reviewed and are negative.    Physical Exam Updated Vital Signs BP 114/72  Pulse 78   Temp (!) 97.5 F (36.4 C) (Oral)   Resp 16   Ht 5' (1.524 m)   Wt 72.6 kg (160 lb)   SpO2 99%   BMI 31.25 kg/m   Physical Exam  Constitutional: She is oriented to person, place, and time. She appears well-developed and well-nourished.  HENT:  Head: Normocephalic and atraumatic.  Eyes: Conjunctivae and EOM are normal.  Neck: Normal range of motion.  Cardiovascular: Normal rate and regular rhythm.   Pulmonary/Chest: Effort normal and breath sounds normal. No stridor. No respiratory distress.  Abdominal: Soft. Bowel sounds are normal. She exhibits no distension. There is tenderness (diffusely). Hernia: periumbilical.  Musculoskeletal: Normal range of motion. She exhibits no edema or deformity.  Neurological: She is alert and  oriented to person, place, and time. No cranial nerve deficit. Coordination normal.  Nursing note and vitals reviewed.    ED Treatments / Results  Labs (all labs ordered are listed, but only abnormal results are displayed) Labs Reviewed  COMPREHENSIVE METABOLIC PANEL - Abnormal; Notable for the following:       Result Value   Sodium 132 (*)    CO2 19 (*)    Glucose, Bld 117 (*)    Calcium 8.2 (*)    Albumin 1.9 (*)    AST 92 (*)    Alkaline Phosphatase 150 (*)    Total Bilirubin 2.9 (*)    All other components within normal limits  CBC - Abnormal; Notable for the following:    RBC 3.57 (*)    HCT 35.7 (*)    MCH 35.6 (*)    RDW 15.8 (*)    Platelets 131 (*)    All other components within normal limits  URINALYSIS, ROUTINE W REFLEX MICROSCOPIC - Abnormal; Notable for the following:    Color, Urine AMBER (*)    APPearance CLOUDY (*)    Protein, ur 30 (*)    Leukocytes, UA TRACE (*)    Squamous Epithelial / LPF TOO NUMEROUS TO COUNT (*)    All other components within normal limits  LIPASE, BLOOD    EKG  EKG Interpretation None       Radiology Ct Abdomen Pelvis W Contrast  Result Date: 10/04/2016 CLINICAL DATA:  Pt c/o abd pain for 3 days with N/V. Hx liver damage per pt. EXAM: CT ABDOMEN AND PELVIS WITH CONTRAST TECHNIQUE: Multidetector CT imaging of the abdomen and pelvis was performed using the standard protocol following bolus administration of intravenous contrast. CONTRAST:  ISOVUE-300 IOPAMIDOL (ISOVUE-300) INJECTION 61% COMPARISON:  04/21/2016 CT FINDINGS: Lower chest: There is a moderate right pleural effusion which has increased since the prior study. There is significant right basilar atelectasis and minimal subsegmental left lower lobe atelectasis. Hepatobiliary: There is significant nodular contour of the surface of the liver combined with enlargement of the caudate lobe, in a cirrhotic morphology. No focal liver lesions are identified. There is  recanalized umbilical vein compatible with portal venous hypertension. There is contrast within the portal vein. Status post cholecystectomy. Pancreas: Unremarkable. No pancreatic ductal dilatation or surrounding inflammatory changes. Spleen: Normal in size without focal abnormality. Adrenals/Urinary Tract: Normal adrenal glands. Normal enhancement and excretion of both kidneys. Urinary bladder is normal in appearance. Stomach/Bowel: The stomach is normal in appearance. Small bowel loops are normal in appearance. There is marked thickening of the colonic loops compatible with colitis. Vascular/Lymphatic: Numerous collateral vessels are compatible with portal venous hypertension better seen throughout the mesentery, in the parametrial regions,  and left upper quadrant. There is minimal atherosclerotic calcification of the abdominal aorta. No associated aneurysm. There is normal vascular opacification of the celiac axis, superior mesenteric artery, and inferior mesenteric artery. Normal appearance of the portal venous system and inferior vena cava. Reproductive: The uterus is present.  No adnexal mass. Other: There is a significant amount of ascites throughout the abdomen and pelvis. There are 2 fluid-filled septated collections at the umbilicus. The larger more superior is 8.3 cm and was previously a 7.7 cm. The more inferior is 5.2 cm and was previously 3.9 cm. Musculoskeletal: No acute or significant osseous findings. IMPRESSION: 1. Cirrhosis, portal venous hypertension, and significant ascites. 2. Interval development of right-sided pleural effusion and right basilar atelectasis. 3. Significant edema and thickening of the colon wall, compatible with colitis. Findings favor inflammatory or infectious causes over ischemia. 4. 2 paraumbilical cystic collections continued to increase in size and complexity since first occurred in May 2017. These likely represent paraumbilical hernias. However, other considerations  would include peritoneal metastatic disease or endometriosis. 5. Status post cholecystectomy.  Status post hysterectomy. Electronically Signed   By: Norva Pavlov M.D.   On: 10/04/2016 09:45    Procedures Procedures (including critical care time)  Medications Ordered in ED Medications  HYDROmorphone (DILAUDID) injection 1 mg (1 mg Intravenous Given 10/04/16 0736)  ondansetron (ZOFRAN) injection 4 mg (4 mg Intravenous Given 10/04/16 0736)  sodium chloride 0.9 % bolus 1,000 mL (0 mLs Intravenous Stopped 10/04/16 1132)  iopamidol (ISOVUE-300) 61 % injection (100 mLs  Contrast Given 10/04/16 0844)  HYDROmorphone (DILAUDID) injection 1 mg (1 mg Intravenous Given 10/04/16 1044)  ciprofloxacin (CIPRO) IVPB 400 mg (0 mg Intravenous Stopped 10/04/16 1302)  metroNIDAZOLE (FLAGYL) IVPB 500 mg (0 mg Intravenous Stopped 10/04/16 1146)  oxyCODONE (Oxy IR/ROXICODONE) immediate release tablet 5 mg (5 mg Oral Given 10/04/16 1207)     Initial Impression / Assessment and Plan / ED Course  I have reviewed the triage vital signs and the nursing notes.  Pertinent labs & imaging results that were available during my care of the patient were reviewed by me and considered in my medical decision making (see chart for details).    Acute on chronic abdominal pain exacerbation. Will r/o emergencies and set up with IR for outpatient paracentesis.   S/s c/w colitis. Pain controlled. Tolerating PO fluids and solids but with mild nausea. Will ask her to follow up with GI for ascites and consideration of paracentesis.   Final Clinical Impressions(s) / ED Diagnoses   Final diagnoses:  Colitis  Other ascites    New Prescriptions Discharge Medication List as of 10/04/2016  2:13 PM    START taking these medications   Details  ciprofloxacin (CIPRO) 500 MG tablet Take 1 tablet (500 mg total) by mouth 2 (two) times daily. One po bid x 7 days, Starting Sat 10/04/2016, Print    docusate sodium (COLACE) 100 MG capsule Take  1 capsule (100 mg total) by mouth every 12 (twelve) hours., Starting Sat 10/04/2016, Print    metroNIDAZOLE (FLAGYL) 500 MG tablet Take 1 tablet (500 mg total) by mouth 2 (two) times daily. One po bid x 7 days, Starting Sat 10/04/2016, Print    ondansetron (ZOFRAN) 4 MG tablet Take 1 tablet (4 mg total) by mouth every 8 (eight) hours as needed for nausea or vomiting., Starting Sat 10/04/2016, Print    oxyCODONE (ROXICODONE) 5 MG immediate release tablet Take 1 tablet (5 mg total) by mouth every 4 (four) hours  as needed for severe pain., Starting Sat 10/04/2016, Print         Kaisha Wachob, Barbara Cower, MD 10/04/16 [id number]

## 2016-10-04 NOTE — ED Notes (Signed)
Unable to give urine specimen at this time .  

## 2016-10-04 NOTE — ED Triage Notes (Signed)
The pt arrived by [facility] from home with abd pain for 3 days  Worse tonight  Nausea nd vomiting  She reports having cirrhosis of the liver.  She told ems she called an ambulance so she would not have to wait in the waiting room

## 2016-10-04 NOTE — ED Notes (Signed)
Patient given turkey sandwich, applesauce, and sprite.  

## 2016-10-26 ENCOUNTER — Emergency Department (HOSPITAL_COMMUNITY)
Admission: EM | Admit: 2016-10-26 | Discharge: 2016-10-26 | Disposition: A | Discharge status: Home / Self Care | Payer: Medicaid Other | Attending: Emergency Medicine | Admitting: Emergency Medicine

## 2016-10-26 ENCOUNTER — Encounter (HOSPITAL_COMMUNITY): Payer: Self-pay | Admitting: Emergency Medicine

## 2016-10-26 DIAGNOSIS — F1910 Other psychoactive substance abuse, uncomplicated: Secondary | ICD-10-CM | POA: Insufficient documentation

## 2016-10-26 DIAGNOSIS — Z9049 Acquired absence of other specified parts of digestive tract: Secondary | ICD-10-CM | POA: Diagnosis not present

## 2016-10-26 DIAGNOSIS — R1084 Generalized abdominal pain: Secondary | ICD-10-CM

## 2016-10-26 DIAGNOSIS — F329 Major depressive disorder, single episode, unspecified: Secondary | ICD-10-CM | POA: Insufficient documentation

## 2016-10-26 DIAGNOSIS — E739 Lactose intolerance, unspecified: Secondary | ICD-10-CM | POA: Insufficient documentation

## 2016-10-26 DIAGNOSIS — F17200 Nicotine dependence, unspecified, uncomplicated: Secondary | ICD-10-CM | POA: Insufficient documentation

## 2016-10-26 DIAGNOSIS — I10 Essential (primary) hypertension: Secondary | ICD-10-CM | POA: Diagnosis not present

## 2016-10-26 LAB — CBC
HEMATOCRIT: 36.6 % (ref 36.0–46.0)
HEMOGLOBIN: 12.9 g/dL (ref 12.0–15.0)
MCH: 35.5 pg — ABNORMAL HIGH (ref 26.0–34.0)
MCHC: 35.2 g/dL (ref 30.0–36.0)
MCV: 100.8 fL — ABNORMAL HIGH (ref 78.0–100.0)
Platelets: 157 10*3/uL (ref 150–400)
RBC: 3.63 MIL/uL — ABNORMAL LOW (ref 3.87–5.11)
RDW: 15.5 % (ref 11.5–15.5)
WBC: 5.1 10*3/uL (ref 4.0–10.5)

## 2016-10-26 LAB — COMPREHENSIVE METABOLIC PANEL
ALBUMIN: 1.9 g/dL — AB (ref 3.5–5.0)
ALT: 33 U/L (ref 14–54)
ANION GAP: 8 (ref 5–15)
AST: 64 U/L — ABNORMAL HIGH (ref 15–41)
Alkaline Phosphatase: 139 U/L — ABNORMAL HIGH (ref 38–126)
BILIRUBIN TOTAL: 2.8 mg/dL — AB (ref 0.3–1.2)
BUN: 9 mg/dL (ref 6–20)
CO2: 22 mmol/L (ref 22–32)
Calcium: 8.3 mg/dL — ABNORMAL LOW (ref 8.9–10.3)
Chloride: 103 mmol/L (ref 101–111)
Creatinine, Ser: 0.96 mg/dL (ref 0.44–1.00)
GFR calc Af Amer: >60 mL/min (ref >60)
GFR calc non Af Amer: >60 mL/min (ref >60)
GLUCOSE: 101 mg/dL — AB (ref 65–99)
Potassium: 4 mmol/L (ref 3.5–5.1)
Sodium: 133 mmol/L — ABNORMAL LOW (ref 135–145)
TOTAL PROTEIN: 8.5 g/dL — AB (ref 6.5–8.1)

## 2016-10-26 LAB — LIPASE, BLOOD: LIPASE: 27 U/L (ref 11–51)

## 2016-10-26 MED ORDER — ONDANSETRON 4 MG PO TBDP: 4.0000 mg | ORAL_TABLET | Freq: Three times a day (TID) | ORAL | 0 refills | Status: DC | PRN

## 2016-10-26 MED ORDER — IOPAMIDOL (ISOVUE-300) INJECTION 61%
INTRAVENOUS | Status: AC
  Filled 2016-10-26: qty 100

## 2016-10-26 MED ORDER — HYDROMORPHONE HCL 1 MG/ML IJ SOLN
0.5000 mg | Freq: Once | INTRAMUSCULAR | Status: AC
  Administered 2016-10-26: 0.5 mg via INTRAVENOUS
  Filled 2016-10-26: qty 1

## 2016-10-26 MED ORDER — LIDOCAINE-EPINEPHRINE 1 %-1:100000 IJ SOLN
20.0000 mL | Freq: Once | INTRAMUSCULAR | Status: AC
  Administered 2016-10-26: 20 mL via INTRADERMAL
  Filled 2016-10-26: qty 20

## 2016-10-26 MED ORDER — ONDANSETRON HCL 4 MG/2ML IJ SOLN
4.0000 mg | Freq: Once | INTRAMUSCULAR | Status: AC
  Administered 2016-10-26: 4 mg via INTRAVENOUS
  Filled 2016-10-26: qty 2

## 2016-10-26 NOTE — ED Triage Notes (Addendum)
abd pain that started today  Has had [place] no v/d has had cramping state sis to have hernia surgery on Friday has hx of cirrosis

## 2016-10-26 NOTE — ED Provider Notes (Signed)
[facility] EMERGENCY DEPARTMENT Provider Note   CSN: [id number] Arrival date & time: 10/26/16  1442     History   Chief Complaint Chief Complaint  Patient presents with  . Abdominal Pain    HPI Linda Vaughn is a 51 y.o. female.  51 yo F with a chief complaint of diffuse abdominal pain. This seems to come and go. Has had some vomiting with it as well. Having some subjective fevers and chills. Pain to her hernia site as well. Having these bowel movements to have those at baseline with her chronic lactulose use. Has been able to tolerate by mouth at home.   The history is provided by the patient.  Abdominal Pain   This is a new problem. The current episode started 6 to 12 hours ago. The problem occurs constantly. The problem has not changed since onset.The pain is associated with an unknown factor. The pain is located in the generalized abdominal region. The quality of the pain is sharp and shooting. The pain is at a severity of 10/10. The pain is severe. Associated symptoms include fever (subjective), diarrhea (chronic from lactulose), nausea and vomiting. Pertinent negatives include dysuria, headaches, arthralgias and myalgias. Nothing aggravates the symptoms. Nothing relieves the symptoms.    Past Medical History:  Diagnosis Date  . Arthritis   . Cirrhosis of liver (HCC)   . Gallstones   . Hepatitis C   . Hypertension     Patient Active Problem List   Diagnosis Date Noted  . Hepatic encephalopathy (HCC) 10/08/2015  . Generalized abdominal pain 10/08/2015  . Sepsis (HCC)   . SBP (spontaneous bacterial peritonitis) (HCC)   . Hepatic cirrhosis (HCC)   . Chronic hepatitis C without hepatic coma (HCC)   . Polysubstance abuse (HCC)   . Ascites 05/24/2015  . Tobacco dependence 04/04/2015  . Umbilical hernia without obstruction and without gangrene 04/04/2015  . Chronic ankle pain 01/27/2015  . Obesity 01/27/2015  . Depression 01/27/2015  .  Osteoarthritis 01/25/2015  . Cholelithiasis 04/20/2012  . HTN (hypertension) 04/20/2012  . Obesity (BMI 30.0-34.9) 04/20/2012    Past Surgical History:  Procedure Laterality Date  . ANKLE SURGERY    . ANKLE SURGERY    . CARPAL TUNNEL RELEASE    . CESAREAN SECTION    . CHOLECYSTECTOMY      OB History    No data available       Home Medications    Prior to Admission medications   Medication Sig Start Date End Date Taking? Authorizing Provider  ciprofloxacin (CIPRO) 500 MG tablet Take 1 tablet (500 mg total) by mouth 2 (two) times daily. One po bid x 7 days 10/04/16   Mesner, Barbara Cower, MD  docusate sodium (COLACE) 100 MG capsule Take 1 capsule (100 mg total) by mouth every 12 (twelve) hours. 10/04/16   Mesner, Barbara Cower, MD  metroNIDAZOLE (FLAGYL) 500 MG tablet Take 1 tablet (500 mg total) by mouth 2 (two) times daily. One po bid x 7 days 10/04/16   Mesner, Barbara Cower, MD  ondansetron (ZOFRAN ODT) 4 MG disintegrating tablet Take 1 tablet (4 mg total) by mouth every 8 (eight) hours as needed for nausea or vomiting. 02/02/16   Renne Crigler, PA-C  ondansetron (ZOFRAN ODT) 4 MG disintegrating tablet Take 1 tablet (4 mg total) by mouth every 8 (eight) hours as needed for nausea or vomiting. 10/26/16   Melene Plan, DO  ondansetron (ZOFRAN) 4 MG tablet Take 1 tablet (4 mg total) by  mouth every 8 (eight) hours as needed for nausea or vomiting. 10/04/16   Mesner, Barbara Cower, MD  oxyCODONE (ROXICODONE) 5 MG immediate release tablet Take 1 tablet (5 mg total) by mouth every 4 (four) hours as needed for severe pain. 10/04/16   Mesner, Barbara Cower, MD    Family History Family History  Problem Relation Age of Onset  . Cancer Mother   . Rheum arthritis Father   . Diabetes Other     Social History Social History  Substance Use Topics  . Smoking status: Current Every Day Smoker    Packs/day: 0.25  . Smokeless tobacco: Never Used  . Alcohol use No     Comment: occ     Allergies   Aspirin; Ibuprofen; and Tylenol  [acetaminophen]   Review of Systems Review of Systems  Constitutional: Positive for chills and fever (subjective).  HENT: Negative for congestion and rhinorrhea.   Eyes: Negative for redness and visual disturbance.  Respiratory: Negative for shortness of breath and wheezing.   Cardiovascular: Negative for chest pain and palpitations.  Gastrointestinal: Positive for abdominal distention, abdominal pain, diarrhea (chronic from lactulose), nausea and vomiting.  Genitourinary: Negative for dysuria and urgency.  Musculoskeletal: Negative for arthralgias and myalgias.  Skin: Negative for pallor and wound.  Neurological: Negative for dizziness and headaches.     Physical Exam Updated Vital Signs BP 113/76 (BP Location: Right Arm)   Pulse 69   Resp 13   SpO2 98%   Physical Exam  Constitutional: She is oriented to person, place, and time. She appears well-developed and well-nourished. No distress.  HENT:  Head: Normocephalic and atraumatic.  Eyes: Pupils are equal, round, and reactive to light. EOM are normal.  Neck: Normal range of motion. Neck supple.  Cardiovascular: Normal rate and regular rhythm.  Exam reveals no gallop and no friction rub.   No murmur heard. Pulmonary/Chest: Effort normal. She has no wheezes. She has no rales.  Abdominal: Soft. She exhibits distension. She exhibits no mass. There is tenderness. There is no guarding. A hernia (periumbilical) is present.  Musculoskeletal: She exhibits no edema or tenderness.  Neurological: She is alert and oriented to person, place, and time.  Skin: Skin is warm and dry. She is not diaphoretic.  Psychiatric: She has a normal mood and affect. Her behavior is normal.  Nursing note and vitals reviewed.    ED Treatments / Results  Labs (all labs ordered are listed, but only abnormal results are displayed) Labs Reviewed  COMPREHENSIVE METABOLIC PANEL - Abnormal; Notable for the following:       Result Value   Sodium 133 (*)     Glucose, Bld 101 (*)    Calcium 8.3 (*)    Total Protein 8.5 (*)    Albumin 1.9 (*)    AST 64 (*)    Alkaline Phosphatase 139 (*)    Total Bilirubin 2.8 (*)    All other components within normal limits  CBC - Abnormal; Notable for the following:    RBC 3.63 (*)    MCV 100.8 (*)    MCH 35.5 (*)    All other components within normal limits  LIPASE, BLOOD    EKG  EKG Interpretation None       Radiology No results found.  Procedures Procedures (including critical care time) EMERGENCY DEPARTMENT [place] ACITES EXAM "Study: Limited Abdominal Ultrasound for Evaluation of Free Fluid"  INDICATIONS: Abd pain  PERFORMED BY: Myself IMAGES ARCHIVED?: Yes VIEWS USES: Right lower quad and Left  lower quad INTERPRETATION: Free fluid present but trace, no discrete pocket for collection    Medications Ordered in ED Medications  iopamidol (ISOVUE-300) 61 % injection (not administered)  lidocaine-EPINEPHrine (XYLOCAINE W/EPI) 1 %-1:100000 (with pres) injection 20 mL (20 mLs Intradermal Given 10/26/16 1717)  HYDROmorphone (DILAUDID) injection 0.5 mg (0.5 mg Intravenous Given 10/26/16 1717)  ondansetron (ZOFRAN) injection 4 mg (4 mg Intravenous Given 10/26/16 1717)     Initial Impression / Assessment and Plan / ED Course  I have reviewed the triage vital signs and the nursing notes.  Pertinent labs & imaging results that were available during my care of the patient were reviewed by me and considered in my medical decision making (see chart for details).     51 yo F with a chief complaint of diffuse abdominal pain. Going on just today. Patient is writhing around in pain on my exam. Labs are unremarkable. With the patient's history of cirrhosis she likely needs a paracentesis to evaluate for SBP. I'm somewhat hesitant to obtain a CT because this would be her ninth of the abdomen since 2016, though with the severe amount of pain that she is claiming I will obtain one. Prior CTs with  cirrhosis and also ? Colitis.  Patient feeling much better after treatment. Repeat abdominal exam is benign. Able to tolerate by mouth. I discussed with her again about cat scanning. We'll defer at this time since she has had 9 previously. She also endorsed recently drinking milk when she is lactose intolerant. I feel this is likely the cause of her symptoms. Bedside ultrasound with no easily accessible fluid collection for paracentesis. I doubt this is SBP. Strict return precautions.  5:44 PM:  I have discussed the diagnosis/risks/treatment options with the patient and family and believe the pt to be eligible for discharge home to follow-up with PCP. We also discussed returning to the ED immediately if new or worsening sx occur. We discussed the sx which are most concerning (e.g., sudden worsening pain, fever, inability to tolerate by mouth) that necessitate immediate return. Medications administered to the patient during their visit and any new prescriptions provided to the patient are listed below.  Medications given during this visit Medications  iopamidol (ISOVUE-300) 61 % injection (not administered)  lidocaine-EPINEPHrine (XYLOCAINE W/EPI) 1 %-1:100000 (with pres) injection 20 mL (20 mLs Intradermal Given 10/26/16 1717)  HYDROmorphone (DILAUDID) injection 0.5 mg (0.5 mg Intravenous Given 10/26/16 1717)  ondansetron (ZOFRAN) injection 4 mg (4 mg Intravenous Given 10/26/16 1717)     The patient appears reasonably screen and/or stabilized for discharge and I doubt any other medical condition or other [facility] requiring further screening, evaluation, or treatment in the ED at this time prior to discharge.    Final Clinical Impressions(s) / ED Diagnoses   Final diagnoses:  Generalized abdominal pain  Lactose intolerance    New Prescriptions New Prescriptions   ONDANSETRON (ZOFRAN ODT) 4 MG DISINTEGRATING TABLET    Take 1 tablet (4 mg total) by mouth every 8 (eight) hours as needed for nausea  or vomiting.     Melene PlanFloyd, Fortino Haag, DO 10/26/16 1744

## 2016-10-26 NOTE — Discharge Instructions (Signed)
Return for worsening abdominal pain, fever °

## 2016-10-27 ENCOUNTER — Other Ambulatory Visit: Payer: Self-pay | Admitting: Family Medicine

## 2016-10-27 DIAGNOSIS — K7031 Alcoholic cirrhosis of liver with ascites: Secondary | ICD-10-CM

## 2016-11-17 ENCOUNTER — Ambulatory Visit: Payer: Medicaid Other | Admitting: Family Medicine

## 2016-11-21 ENCOUNTER — Telehealth: Payer: Self-pay

## 2016-11-21 NOTE — Telephone Encounter (Signed)
Patient wants to know if she should be taking high blood pressure medication. I advised her she needs to keep next scheduled appointment. Thanks!

## 2016-11-23 ENCOUNTER — Encounter (HOSPITAL_COMMUNITY): Payer: Self-pay | Admitting: Emergency Medicine

## 2016-11-23 ENCOUNTER — Other Ambulatory Visit: Payer: Self-pay

## 2016-11-23 ENCOUNTER — Inpatient Hospital Stay (HOSPITAL_COMMUNITY)
Admission: EM | Admit: 2016-11-23 | Discharge: 2016-11-28 | DRG: 373 | Disposition: A | Discharge status: Home / Self Care | Payer: Medicaid Other | Attending: Family Medicine | Admitting: Family Medicine

## 2016-11-23 DIAGNOSIS — R188 Other ascites: Secondary | ICD-10-CM

## 2016-11-23 DIAGNOSIS — B182 Chronic viral hepatitis C: Secondary | ICD-10-CM | POA: Diagnosis present

## 2016-11-23 DIAGNOSIS — K7460 Unspecified cirrhosis of liver: Secondary | ICD-10-CM | POA: Diagnosis present

## 2016-11-23 DIAGNOSIS — F17200 Nicotine dependence, unspecified, uncomplicated: Secondary | ICD-10-CM | POA: Diagnosis present

## 2016-11-23 DIAGNOSIS — R109 Unspecified abdominal pain: Secondary | ICD-10-CM

## 2016-11-23 DIAGNOSIS — K7031 Alcoholic cirrhosis of liver with ascites: Secondary | ICD-10-CM | POA: Diagnosis present

## 2016-11-23 DIAGNOSIS — R112 Nausea with vomiting, unspecified: Secondary | ICD-10-CM

## 2016-11-23 DIAGNOSIS — F329 Major depressive disorder, single episode, unspecified: Secondary | ICD-10-CM | POA: Diagnosis present

## 2016-11-23 DIAGNOSIS — K429 Umbilical hernia without obstruction or gangrene: Secondary | ICD-10-CM | POA: Diagnosis present

## 2016-11-23 DIAGNOSIS — I10 Essential (primary) hypertension: Secondary | ICD-10-CM | POA: Diagnosis present

## 2016-11-23 DIAGNOSIS — K652 Spontaneous bacterial peritonitis: Principal | ICD-10-CM | POA: Diagnosis present

## 2016-11-23 DIAGNOSIS — Z79899 Other long term (current) drug therapy: Secondary | ICD-10-CM

## 2016-11-23 HISTORY — DX: Other ascites: R18.8

## 2016-11-23 HISTORY — DX: Spontaneous bacterial peritonitis: K65.2

## 2016-11-23 HISTORY — PX: PARACENTESIS: SHX844

## 2016-11-23 LAB — COMPREHENSIVE METABOLIC PANEL
ALK PHOS: 145 U/L — AB (ref 38–126)
ALT: 31 U/L (ref 14–54)
ANION GAP: 5 (ref 5–15)
AST: 55 U/L — AB (ref 15–41)
Albumin: 1.8 g/dL — ABNORMAL LOW (ref 3.5–5.0)
BILIRUBIN TOTAL: 3.8 mg/dL — AB (ref 0.3–1.2)
BUN: 12 mg/dL (ref 6–20)
CALCIUM: 8.6 mg/dL — AB (ref 8.9–10.3)
CO2: 22 mmol/L (ref 22–32)
Chloride: 108 mmol/L (ref 101–111)
Creatinine, Ser: 0.86 mg/dL (ref 0.44–1.00)
GFR calc Af Amer: >60 mL/min (ref >60)
GLUCOSE: 115 mg/dL — AB (ref 65–99)
POTASSIUM: 4.4 mmol/L (ref 3.5–5.1)
Sodium: 135 mmol/L (ref 135–145)
TOTAL PROTEIN: 7.9 g/dL (ref 6.5–8.1)

## 2016-11-23 LAB — CBC WITH DIFFERENTIAL/PLATELET
BASOS ABS: 0 10*3/uL (ref 0.0–0.1)
BASOS PCT: 0 %
EOS PCT: 4 %
Eosinophils Absolute: 0.3 10*3/uL (ref 0.0–0.7)
HCT: 34.1 % — ABNORMAL LOW (ref 36.0–46.0)
Hemoglobin: 11.5 g/dL — ABNORMAL LOW (ref 12.0–15.0)
LYMPHS PCT: 16 %
Lymphs Abs: 1.2 10*3/uL (ref 0.7–4.0)
MCH: 35.5 pg — ABNORMAL HIGH (ref 26.0–34.0)
MCHC: 33.7 g/dL (ref 30.0–36.0)
MCV: 105.2 fL — AB (ref 78.0–100.0)
MONO ABS: 0.7 10*3/uL (ref 0.1–1.0)
Monocytes Relative: 9 %
Neutro Abs: 5.1 10*3/uL (ref 1.7–7.7)
Neutrophils Relative %: 71 %
PLATELETS: 174 10*3/uL (ref 150–400)
RBC: 3.24 MIL/uL — AB (ref 3.87–5.11)
RDW: 17.4 % — AB (ref 11.5–15.5)
WBC: 7.2 10*3/uL (ref 4.0–10.5)

## 2016-11-23 LAB — AMMONIA: AMMONIA: 84 umol/L — AB (ref 9–35)

## 2016-11-23 LAB — I-STAT CG4 LACTIC ACID, ED: Lactic Acid, Venous: 1.54 mmol/L (ref 0.5–1.9)

## 2016-11-23 MED ORDER — LIDOCAINE HCL (PF) 1 % IJ SOLN
INTRAMUSCULAR | Status: AC
  Filled 2016-11-23: qty 30

## 2016-11-23 MED ORDER — PROMETHAZINE HCL 25 MG/ML IJ SOLN
12.5000 mg | Freq: Once | INTRAMUSCULAR | Status: AC
  Administered 2016-11-23: 12.5 mg via INTRAVENOUS
  Filled 2016-11-23: qty 1

## 2016-11-23 MED ORDER — FENTANYL CITRATE (PF) 100 MCG/2ML IJ SOLN
50.0000 ug | Freq: Once | INTRAMUSCULAR | Status: AC
  Administered 2016-11-23: 50 ug via INTRAVENOUS
  Filled 2016-11-23: qty 2

## 2016-11-23 NOTE — ED Triage Notes (Signed)
Pt arrives via EMS from home with 3 hours of severe abdominal pain. Pt has abdominal distension on arrival. Pt with known hernia, cirrhosis and hep C. Denies recent fever, no vomiting, +nausea.

## 2016-11-23 NOTE — ED Provider Notes (Signed)
[facility]   CSN: [id number] Arrival date & time: 11/23/16  1745     History   Chief Complaint Chief Complaint  Patient presents with  . Abdominal Pain    HPI Linda Vaughn is a 51 y.o. female who  has a past medical history of Arthritis, Cirrhosis of liver (HCC), Gallstones, Hepatitis C, and Hypertension. She presents with abdominal pain. The patient has had 3 days of worsening, constant, generalized abdominal pain. She c/o pain at the umbilical hernia site as well. She has worsening ascites and states that it has been "years" since she had a paracentseis. She follow at [facility] for her liver disease. Patient states that she feels that she cannot make a bm, although she took 2 BMs yesterday. She is taking all of her medications as directed. She has associated N/V. She denies cp/SOB. She has had subjective fevers.   HPI  Past Medical History:  Diagnosis Date  . Arthritis   . Cirrhosis of liver (HCC)   . Gallstones   . Hepatitis C   . Hypertension     Patient Active Problem List   Diagnosis Date Noted  . Hepatic encephalopathy (HCC) 10/08/2015  . Generalized abdominal pain 10/08/2015  . Sepsis (HCC)   . SBP (spontaneous bacterial peritonitis) (HCC)   . Hepatic cirrhosis (HCC)   . Chronic hepatitis C without hepatic coma (HCC)   . Polysubstance abuse (HCC)   . Ascites 05/24/2015  . Tobacco dependence 04/04/2015  . Umbilical hernia without obstruction and without gangrene 04/04/2015  . Chronic ankle pain 01/27/2015  . Obesity 01/27/2015  . Depression 01/27/2015  . Osteoarthritis 01/25/2015  . Cholelithiasis 04/20/2012  . HTN (hypertension) 04/20/2012  . Obesity (BMI 30.0-34.9) 04/20/2012    Past Surgical History:  Procedure Laterality Date  . ANKLE SURGERY    . ANKLE SURGERY    . CARPAL TUNNEL RELEASE    . CESAREAN SECTION    . CHOLECYSTECTOMY      OB History    No data available       Home  Medications    Prior to Admission medications   Medication Sig Start Date End Date Taking? Authorizing Provider  DULoxetine (CYMBALTA) 20 MG capsule Take 20 mg daily by mouth.   Yes [provider]  lactulose (CHRONULAC) 10 GM/15ML solution TAKE 15 MLS TWICE A DAY 10/27/16  Yes Julianne HandlerHollis, Lachina M, FNP  ribavirin (REBETOL) 200 MG capsule Take 400-600 mg See admin instructions by mouth. Take three tablets (600mg ) in the morning and two tablets (400mg ) in the afternoon-for treamnt of chronic hep C. 11/04/16  Yes [provider]  Sofosbuvir-Velpatasvir 400-100 MG TABS Take 1 tablet daily by mouth. 11/04/16  Yes [provider]  spironolactone (ALDACTONE) 100 MG tablet TAKE 1 TABLET (100 MG TOTAL) BY MOUTH DAILY. 10/27/16  Yes Massie MaroonHollis, Lachina M, FNP  docusate sodium (COLACE) 100 MG capsule Take 1 capsule (100 mg total) by mouth every 12 (twelve) hours. Patient not taking: Reported on 11/23/2016 10/04/16   Mesner, Barbara CowerJason, MD    Family History Family History  Problem Relation Age of Onset  . Cancer Mother   . Rheum arthritis Father   . Diabetes Other     Social History Social History   Tobacco Use  . Smoking status: Current Every Day Smoker    Packs/day: 0.25  . Smokeless tobacco: Never Used  Substance Use Topics  . Alcohol use: No    Alcohol/week: 0.0 oz  Comment: occ  . Drug use: No     Allergies   Aspirin; Ibuprofen; and Tylenol [acetaminophen]   Review of Systems Review of Systems  Ten systems reviewed and are negative for acute change, except as noted in the HPI.    Physical Exam Updated Vital Signs BP (!) 127/91   Pulse 98   Temp 98.3 F (36.8 C) (Oral)   Resp (!) 22   Ht 5' (1.524 m)   Wt 70.3 kg (155 lb)   SpO2 97%   BMI 30.27 kg/m   Physical Exam  Constitutional: She is oriented to person, place, and time. She appears well-developed and well-nourished.  HENT:  Head: Normocephalic and atraumatic.  Mouth/Throat: Oropharynx is  clear and moist.  Eyes: EOM are normal. Pupils are equal, round, and reactive to light. No scleral icterus.  Cardiovascular: Normal rate and regular rhythm.  Pulmonary/Chest: Effort normal. She has no rales.  Abdominal: She exhibits ascites. There is generalized tenderness (diffuse). A hernia is present.  Tight, distended abdomen. Large, soft periumbilical hernia  Neurological: She is alert and oriented to person, place, and time.  Skin: Skin is warm.  Psychiatric: She has a normal mood and affect. Her behavior is normal.     ED Treatments / Results  Labs (all labs ordered are listed, but only abnormal results are displayed) Labs Reviewed  COMPREHENSIVE METABOLIC PANEL - Abnormal; Notable for the following components:      Result Value   Glucose, Bld 115 (*)    Calcium 8.6 (*)    Albumin 1.8 (*)    AST 55 (*)    Alkaline Phosphatase 145 (*)    Total Bilirubin 3.8 (*)    All other components within normal limits  CBC WITH DIFFERENTIAL/PLATELET - Abnormal; Notable for the following components:   RBC 3.24 (*)    Hemoglobin 11.5 (*)    HCT 34.1 (*)    MCV 105.2 (*)    MCH 35.5 (*)    RDW 17.4 (*)    All other components within normal limits  URINALYSIS, ROUTINE W REFLEX MICROSCOPIC  AMMONIA  I-STAT CG4 LACTIC ACID, ED    EKG  EKG Interpretation None       Radiology No results found.  Procedures ABDOMINAL PARACENTESIS Date/Time: 11/24/2016 12:47 AM Performed by: Arthor CaptainHarris, Chitara Clonch, PA-C Authorized by: Arthor CaptainHarris, Kela Baccari, PA-C  Consent: Written consent obtained. Risks and benefits: risks, benefits and alternatives were discussed Consent given by: patient Patient understanding: patient states understanding of the procedure being performed Site marked: the operative site was marked Imaging studies: imaging studies available Patient identity confirmed: verbally with patient Time out: Immediately prior to procedure a "time out" was called to verify the correct patient,  procedure, equipment, support staff and site/side marked as required. Preparation: Patient was prepped and draped in the usual sterile fashion. Local anesthesia used: yes Anesthesia: local infiltration  Anesthesia: Local anesthesia used: yes Local Anesthetic: lidocaine 1% without epinephrine Anesthetic total: 10 mL Patient tolerance: Patient tolerated the procedure well with no immediate complications    (including critical care time)  Medications Ordered in ED Medications  promethazine (PHENERGAN) injection 12.5 mg (12.5 mg Intravenous Given 11/23/16 1904)     Initial Impression / Assessment and Plan / ED Course  I have reviewed the triage vital signs and the nursing notes.  Pertinent labs & imaging results that were available during my care of the patient were reviewed by me and considered in my medical decision making (see chart for details).  Patient body fluid panels still pending. I have given sign out to PA upstill who will assume care. Expect discharge and f/u with Gi at [facility] for scheduled large volume paracentsis.   Final Clinical Impressions(s) / ED Diagnoses   Final diagnoses:  None    ED Discharge Orders    None       Arthor Captain, PA-C 11/24/16 1610    Arby Barrette, MD 11/25/16 1116

## 2016-11-24 ENCOUNTER — Inpatient Hospital Stay (HOSPITAL_COMMUNITY): Payer: Medicaid Other

## 2016-11-24 ENCOUNTER — Other Ambulatory Visit: Payer: Self-pay

## 2016-11-24 ENCOUNTER — Encounter (HOSPITAL_COMMUNITY): Payer: Self-pay | Admitting: Internal Medicine

## 2016-11-24 DIAGNOSIS — Z79899 Other long term (current) drug therapy: Secondary | ICD-10-CM | POA: Diagnosis not present

## 2016-11-24 DIAGNOSIS — B182 Chronic viral hepatitis C: Secondary | ICD-10-CM

## 2016-11-24 DIAGNOSIS — K7031 Alcoholic cirrhosis of liver with ascites: Secondary | ICD-10-CM | POA: Diagnosis present

## 2016-11-24 DIAGNOSIS — K429 Umbilical hernia without obstruction or gangrene: Secondary | ICD-10-CM | POA: Diagnosis present

## 2016-11-24 DIAGNOSIS — R112 Nausea with vomiting, unspecified: Secondary | ICD-10-CM

## 2016-11-24 DIAGNOSIS — R109 Unspecified abdominal pain: Secondary | ICD-10-CM | POA: Diagnosis not present

## 2016-11-24 DIAGNOSIS — F17200 Nicotine dependence, unspecified, uncomplicated: Secondary | ICD-10-CM | POA: Diagnosis present

## 2016-11-24 DIAGNOSIS — K652 Spontaneous bacterial peritonitis: Secondary | ICD-10-CM | POA: Diagnosis not present

## 2016-11-24 DIAGNOSIS — F329 Major depressive disorder, single episode, unspecified: Secondary | ICD-10-CM | POA: Diagnosis present

## 2016-11-24 DIAGNOSIS — I10 Essential (primary) hypertension: Secondary | ICD-10-CM | POA: Diagnosis present

## 2016-11-24 LAB — LIPASE, BLOOD: Lipase: 28 U/L (ref 11–51)

## 2016-11-24 LAB — GLUCOSE, PLEURAL OR PERITONEAL FLUID: Glucose, Fluid: 84 mg/dL

## 2016-11-24 LAB — URINALYSIS, ROUTINE W REFLEX MICROSCOPIC
GLUCOSE, UA: NEGATIVE mg/dL
HGB URINE DIPSTICK: NEGATIVE
KETONES UR: NEGATIVE mg/dL
Leukocytes, UA: NEGATIVE
Nitrite: NEGATIVE
PROTEIN: NEGATIVE mg/dL
Specific Gravity, Urine: 1.026 (ref 1.005–1.030)
pH: 5 (ref 5.0–8.0)

## 2016-11-24 LAB — GRAM STAIN

## 2016-11-24 LAB — ALBUMIN, PLEURAL OR PERITONEAL FLUID

## 2016-11-24 LAB — PROTEIN, PLEURAL OR PERITONEAL FLUID: Total protein, fluid: <3 g/dL

## 2016-11-24 LAB — LACTATE DEHYDROGENASE, PLEURAL OR PERITONEAL FLUID: LD, Fluid: 65 U/L — ABNORMAL HIGH (ref 3–23)

## 2016-11-24 LAB — TROPONIN I
Troponin I: <0.03 ng/mL (ref <0.03)
Troponin I: <0.03 ng/mL (ref <0.03)

## 2016-11-24 LAB — BODY FLUID CELL COUNT WITH DIFFERENTIAL
Eos, Fluid: 0 %
Lymphs, Fluid: 1 %
MONOCYTE-MACROPHAGE-SEROUS FLUID: 7 % — AB (ref 50–90)
Neutrophil Count, Fluid: 92 % — ABNORMAL HIGH (ref 0–25)
Total Nucleated Cell Count, Fluid: 9800 cu mm — ABNORMAL HIGH (ref 0–1000)

## 2016-11-24 LAB — PATHOLOGIST SMEAR REVIEW

## 2016-11-24 MED ORDER — RIBAVIRIN 200 MG PO CAPS
600.0000 mg | ORAL_CAPSULE | Freq: Every day | ORAL | Status: DC
  Administered 2016-11-24 – 2016-11-26 (×3): 600 mg via ORAL
  Filled 2016-11-24 (×5): qty 3

## 2016-11-24 MED ORDER — SOFOSBUVIR-VELPATASVIR 400-100 MG PO TABS
1.0000 | ORAL_TABLET | Freq: Every day | ORAL | Status: DC
  Administered 2016-11-24 – 2016-11-28 (×5): 1 via ORAL
  Filled 2016-11-24 (×5): qty 1

## 2016-11-24 MED ORDER — RIBAVIRIN 200 MG PO CAPS
400.0000 mg | ORAL_CAPSULE | Freq: Every day | ORAL | Status: DC
  Administered 2016-11-24 – 2016-11-25 (×2): 400 mg via ORAL
  Filled 2016-11-24 (×3): qty 2

## 2016-11-24 MED ORDER — HYDROMORPHONE HCL 1 MG/ML IJ SOLN
0.5000 mg | Freq: Once | INTRAMUSCULAR | Status: AC
  Administered 2016-11-24: 0.5 mg via INTRAVENOUS
  Filled 2016-11-24: qty 1

## 2016-11-24 MED ORDER — LACTULOSE 10 GM/15ML PO SOLN
10.0000 g | Freq: Two times a day (BID) | ORAL | Status: DC
  Administered 2016-11-24 – 2016-11-28 (×9): 10 g via ORAL
  Filled 2016-11-24 (×10): qty 15

## 2016-11-24 MED ORDER — CIPROFLOXACIN IN D5W 400 MG/200ML IV SOLN
400.0000 mg | Freq: Once | INTRAVENOUS | Status: AC
  Administered 2016-11-24: 400 mg via INTRAVENOUS
  Filled 2016-11-24: qty 200

## 2016-11-24 MED ORDER — SODIUM CHLORIDE 0.9% FLUSH
3.0000 mL | Freq: Two times a day (BID) | INTRAVENOUS | Status: DC
  Administered 2016-11-24 – 2016-11-28 (×8): 3 mL via INTRAVENOUS

## 2016-11-24 MED ORDER — SPIRONOLACTONE 25 MG PO TABS
100.0000 mg | ORAL_TABLET | Freq: Every day | ORAL | Status: DC
  Administered 2016-11-24 – 2016-11-28 (×5): 100 mg via ORAL
  Filled 2016-11-24 (×5): qty 4

## 2016-11-24 MED ORDER — RIBAVIRIN 200 MG PO CAPS: 400.0000 mg | ORAL_CAPSULE | ORAL | Status: DC

## 2016-11-24 MED ORDER — HYDROMORPHONE HCL 1 MG/ML IJ SOLN
0.5000 mg | INTRAMUSCULAR | Status: DC | PRN
  Administered 2016-11-24 – 2016-11-28 (×20): 0.5 mg via INTRAVENOUS
  Filled 2016-11-24 (×8): qty 1
  Filled 2016-11-24: qty 0.5
  Filled 2016-11-24 (×11): qty 1

## 2016-11-24 MED ORDER — SODIUM CHLORIDE 0.9 % IV SOLN: 250.0000 mL | INTRAVENOUS | Status: DC | PRN

## 2016-11-24 MED ORDER — DEXTROSE 5 % IV SOLN
2.0000 g | INTRAVENOUS | Status: DC
  Administered 2016-11-24 – 2016-11-26 (×3): 2 g via INTRAVENOUS
  Filled 2016-11-24 (×3): qty 2

## 2016-11-24 MED ORDER — DULOXETINE HCL 20 MG PO CPEP
20.0000 mg | ORAL_CAPSULE | Freq: Every day | ORAL | Status: DC
  Administered 2016-11-24 – 2016-11-28 (×5): 20 mg via ORAL
  Filled 2016-11-24 (×5): qty 1

## 2016-11-24 MED ORDER — ALBUMIN HUMAN 5 % IV SOLN
100.0000 g | Freq: Once | INTRAVENOUS | Status: DC
  Filled 2016-11-24: qty 2000

## 2016-11-24 MED ORDER — SODIUM CHLORIDE 0.9% FLUSH: 3.0000 mL | INTRAVENOUS | Status: DC | PRN

## 2016-11-24 MED ORDER — ALBUMIN HUMAN 25 % IV SOLN
100.0000 g | Freq: Once | INTRAVENOUS | Status: AC
  Administered 2016-11-24: 100 g via INTRAVENOUS
  Filled 2016-11-24: qty 400

## 2016-11-24 MED ORDER — METRONIDAZOLE IN NACL 5-0.79 MG/ML-% IV SOLN
500.0000 mg | Freq: Once | INTRAVENOUS | Status: AC
  Administered 2016-11-24: 500 mg via INTRAVENOUS
  Filled 2016-11-24: qty 100

## 2016-11-24 MED ORDER — RIFAXIMIN 550 MG PO TABS
550.0000 mg | ORAL_TABLET | Freq: Two times a day (BID) | ORAL | Status: DC
  Administered 2016-11-24 – 2016-11-28 (×9): 550 mg via ORAL
  Filled 2016-11-24 (×10): qty 1

## 2016-11-24 MED ORDER — ONDANSETRON HCL 4 MG/2ML IJ SOLN: 4.0000 mg | Freq: Four times a day (QID) | INTRAMUSCULAR | Status: DC | PRN

## 2016-11-24 NOTE — ED Notes (Signed)
Hung 4th bottle of albumin

## 2016-11-24 NOTE — H&P (Signed)
TRH H&P   Patient Demographics:    Linda Vaughn, is a 51 y.o. female  MRN: [id number]   DOB - [birth year]  Admit Date - 11/23/2016  Outpatient Primary MD for the patient is Dorena Dew, FNP  Referring MD/NP/PA: Charlann Lange  Outpatient Specialists:      Patient coming from: home  Chief Complaint  Patient presents with  . Abdominal Pain      HPI:    Linda Vaughn  is a 51 y.o. female, w Hep C cirrhosis, h/o hepatic encephalopathy, ascites presents w c/o diffuse abdominal pain for the past 3 days.  Pt notes n/v x1, no bloody emesis, no coffee grounds.  Pt denies fever, chills, cp, palp, sob, diarrhea, brbpr.   Pt presented to ED due to n/v, abd pain and abdominal distention.   In ED,   Pt had paracentesis of approximately 2L.  Wbc 9,800 with 92% neutrophils.   => qualifies for SBP.   Alb <1.0 LDH 65 Ammonia 84 Glucose 135 Bun 12, Creatinine 0.86 Glucose 115 Alb 1.8 Ast 55, Alt 31, Alk  Phos 145, T bili 3.8  Pt will be admitted for SBP     Review of systems:    In addition to the HPI above,  No Fever-chills, No Headache, No changes with Vision or hearing, No problems swallowing food or Liquids, No Chest pain, Cough or Shortness of Breath,  No Blood in stool or Urine, No dysuria, No new skin rashes or bruises, No new joints pains-aches,  No new weakness, tingling, numbness in any extremity, No recent weight gain or loss, No polyuria, polydypsia or polyphagia, No significant Mental Stressors.  A full 10 point Review of Systems was done, except as stated above, all other Review of Systems were negative.   With Past History of the following :    Past Medical History:  Diagnosis Date  . Arthritis   . Ascites   . Cirrhosis of liver ([place])   . Gallstones   . Hepatitis C   . Hypertension       Past Surgical History:  Procedure Laterality  Date  . ANKLE SURGERY    . ANKLE SURGERY    . CARPAL TUNNEL RELEASE    . CESAREAN SECTION    . CHOLECYSTECTOMY        Social History:     Social History   Tobacco Use  . Smoking status: Current Every Day Smoker    Packs/day: 0.25  . Smokeless tobacco: Never Used  Substance Use Topics  . Alcohol use: No    Alcohol/week: 0.0 oz    Comment: occ     Lives - at home  Mobility -  Walks by self   Family History :     Family History  Problem Relation Age of Onset  . Cancer Mother   . Rheum arthritis Father   .  Diabetes Other       Home Medications:   Prior to Admission medications   Medication Sig Start Date End Date Taking? Authorizing Provider  DULoxetine (CYMBALTA) 20 MG capsule Take 20 mg daily by mouth.   Yes [provider]  lactulose (CHRONULAC) 10 GM/15ML solution TAKE 15 MLS TWICE A DAY 10/27/16  Yes Cammie Sickle M, FNP  ribavirin (REBETOL) 200 MG capsule Take 400-600 mg See admin instructions by mouth. Take three tablets (640m) in the morning and two tablets (4081m in the afternoon-for treamnt of chronic hep C. 11/04/16  Yes [provider]  Sofosbuvir-Velpatasvir 400-100 MG TABS Take 1 tablet daily by mouth. 11/04/16  Yes [provider]  spironolactone (ALDACTONE) 100 MG tablet TAKE 1 TABLET (100 MG TOTAL) BY MOUTH DAILY. 10/27/16  Yes HoDorena DewFNP  docusate sodium (COLACE) 100 MG capsule Take 1 capsule (100 mg total) by mouth every 12 (twelve) hours. Patient not taking: Reported on 11/23/2016 10/04/16   MeMerrily PewMD     Allergies:     Allergies  Allergen Reactions  . Aspirin Other (See Comments)    Liver damage   . Ibuprofen Other (See Comments)    Liver damage  . Tylenol [Acetaminophen] Other (See Comments)    Liver damage     Physical Exam:   Vitals  Blood pressure 118/71, pulse (!) 104, temperature 99.2 F (37.3 C), temperature source Oral, resp. rate 17, height 5' (1.524 m), weight 70.3 kg  (155 lb), SpO2 95 %.   1. General  lying in bed in NAD,   2. Normal affect and insight, Not Suicidal or Homicidal, Awake Alert, Oriented X 3.  3. No F.N deficits, ALL C.Nerves Intact, Strength 5/5 all 4 extremities, Sensation intact all 4 extremities, Plantars down going.  4. Ears and Eyes appear Normal, Conjunctivae clear, PERRLA. Moist Oral Mucosa.  5. Supple Neck, No JVD, No cervical lymphadenopathy appriciated, No Carotid Bruits.  6. Symmetrical Chest wall movement, Good air movement bilaterally, CTAB.  7. RRR, No Gallops, Rubs or Murmurs, No Parasternal Heave.  8. Abdomen distended, + umbilical hernia, diffuse mild tenderness, no guarding, no rebound, + bs  9.  No Cyanosis, Normal Skin Turgor, No Skin Rash or Bruise.  10. Good muscle tone,  joints appear normal , no effusions, Normal ROM.  11. No Palpable Lymph Nodes in Neck or Axillae  No palmar erythema, no asterixis.    Data Review:    CBC Recent Labs  Lab 11/23/16 1819  WBC 7.2  HGB 11.5*  HCT 34.1*  PLT 174  MCV 105.2*  MCH 35.5*  MCHC 33.7  RDW 17.4*  LYMPHSABS 1.2  MONOABS 0.7  EOSABS 0.3  BASOSABS 0.0   ------------------------------------------------------------------------------------------------------------------  Chemistries  Recent Labs  Lab 11/23/16 1819  NA 135  K 4.4  CL 108  CO2 22  GLUCOSE 115*  BUN 12  CREATININE 0.86  CALCIUM 8.6*  AST 55*  ALT 31  ALKPHOS 145*  BILITOT 3.8*   ------------------------------------------------------------------------------------------------------------------ estimated creatinine clearance is 66.2 mL/min (by C-G formula based on SCr of 0.86 mg/dL). ------------------------------------------------------------------------------------------------------------------ No results for input(s): TSH, T4TOTAL, T3FREE, THYROIDAB in the last 72 hours.  Invalid input(s): FREET3  Coagulation profile No results for input(s): INR, PROTIME in the last 168  hours. ------------------------------------------------------------------------------------------------------------------- No results for input(s): DDIMER in the last 72 hours. -------------------------------------------------------------------------------------------------------------------  Cardiac Enzymes No results for input(s): CKMB, TROPONINI, MYOGLOBIN in the last 168 hours.  Invalid input(s): CK ------------------------------------------------------------------------------------------------------------------ No results found  for: BNP   ---------------------------------------------------------------------------------------------------------------  Urinalysis    Component Value Date/Time   COLORURINE AMBER (A) 10/04/2016 0718   APPEARANCEUR CLOUDY (A) 10/04/2016 0718   LABSPEC 1.023 10/04/2016 0718   PHURINE 5.0 10/04/2016 0718   GLUCOSEU NEGATIVE 10/04/2016 0718   HGBUR NEGATIVE 10/04/2016 0718   BILIRUBINUR NEGATIVE 10/04/2016 0718   KETONESUR NEGATIVE 10/04/2016 0718   PROTEINUR 30 (A) 10/04/2016 0718   UROBILINOGEN >=8.0 03/12/2016 1138   NITRITE NEGATIVE 10/04/2016 0718   LEUKOCYTESUR TRACE (A) 10/04/2016 0718    ----------------------------------------------------------------------------------------------------------------   Imaging Results:    No results found.     Assessment & Plan:    Principal Problem:   SBP (spontaneous bacterial peritonitis) ([place]) Active Problems:   Hepatic cirrhosis ([facility])   Chronic hepatitis C without hepatic coma ([facility])   Abdominal pain   Nausea & vomiting    SBP s/p paracentesis Rocephin pharmacy to dose Await culture results Albumin 1.5 gm/ kg  , consider 37m/kg on day3  Ascites May need repeat paracentesis due to still significant abdominal distension Consider prophylaxis on discharge with Bactriim or Cipro due to >=2 episodes of sbp in the past 1 year.   Abd pain secondary to SBP Dilaudid 0.567miv q4h prn    N/v zofran 71m271mv q6h prn  Chronic hepatitis C  With ascites, portal hypertension, h/o hepatic encephalopathy Cont lactulose Start rifaxamin (recent clinic trials show benefit)  Hep C Cont tx    DVT Prophylaxis  SCDs   AM Labs Ordered, also please review Full Orders  Family Communication: Admission, patients condition and plan of care including tests being ordered have been discussed with the patient  who indicate understanding and agree with the plan and Code Status.  Code Status FULL CODE  Likely DC to  home  Condition GUARDED    Consults called:  none  Admission status: inpatient  Time spent in minutes : 45    JamJani GravelD on 11/24/2016 at 4:36 AM  Between 7am to 7pm - Pager - [phone] 7pm go to [email or web address] - password [email or web address] - Office  [phone]

## 2016-11-24 NOTE — ED Notes (Signed)
Medications of Ribavirin and Epclusa counted with another RN and taken directly to pharmacy.  Sheet for patient chart at bedside.

## 2016-11-24 NOTE — ED Notes (Signed)
8th bottle of albumin finished infusing. Is totally complete.

## 2016-11-24 NOTE — ED Notes (Signed)
3rd bottle of Albumin hung

## 2016-11-24 NOTE — ED Notes (Addendum)
Hung 7th bottle of albumin ( 1 left).

## 2016-11-24 NOTE — ED Notes (Addendum)
Hung 5th bottle of albumin (3 more left).

## 2016-11-24 NOTE — ED Notes (Addendum)
Hung 6th bottle of albumin (2 more)

## 2016-11-24 NOTE — ED Provider Notes (Signed)
Patient signed out at end of shift by Huntley DecAbi Harris, PA-C. She is here for abdominal pain with a history of cirrhosis. Paracentesis performed tonight with tests results pending at time of sign out.   Plan: review labs and anticipate d/ch home with GI follow up.   Review of peritoneal fluid shows elevated WBC to 9800 indicating SBP. Cipro and Flagyl started. Patient reports continued pain and that "Fentanyl does nothing for me". Dilaudid ordered. [facility]attenKim[facility] for [facility].   Elpidio AnisUpstill, Kurt Azimi, PA-C 11/24/16 [phone]    Arby BarrettePfeiffer, Marcy, MD 11/25/16 1116

## 2016-11-24 NOTE — Progress Notes (Signed)
Patient seen and examined at bedside, patient admitted after midnight, please see earlier detailed admission note by Pearson GrippeJames Kim, MD. Briefly, patient presented with abdominal pain and found to have ascites and evidence of SBP after paracentesis. Currently on antibiotics.   Jacquelin Hawkingalph Eiman Maret, MD [facility] 11/24/2016, 11:50 AM Pager: [phone]

## 2016-11-24 NOTE — ED Notes (Signed)
Pt sitting up in bed eating lunch. Is alert, reports abdominal pain is better.

## 2016-11-24 NOTE — ED Notes (Signed)
Hung 8th bottle of albumin (last one).

## 2016-11-25 ENCOUNTER — Inpatient Hospital Stay (HOSPITAL_COMMUNITY): Payer: Medicaid Other

## 2016-11-25 LAB — CBC
HCT: 26.2 % — ABNORMAL LOW (ref 36.0–46.0)
HEMOGLOBIN: 8.9 g/dL — AB (ref 12.0–15.0)
MCH: 35.9 pg — ABNORMAL HIGH (ref 26.0–34.0)
MCHC: 34 g/dL (ref 30.0–36.0)
MCV: 105.6 fL — ABNORMAL HIGH (ref 78.0–100.0)
Platelets: 131 10*3/uL — ABNORMAL LOW (ref 150–400)
RBC: 2.48 MIL/uL — ABNORMAL LOW (ref 3.87–5.11)
RDW: 17.9 % — AB (ref 11.5–15.5)
WBC: 10.1 10*3/uL (ref 4.0–10.5)

## 2016-11-25 LAB — COMPREHENSIVE METABOLIC PANEL
ALBUMIN: 2.6 g/dL — AB (ref 3.5–5.0)
ALT: 22 U/L (ref 14–54)
ANION GAP: 6 (ref 5–15)
AST: 38 U/L (ref 15–41)
Alkaline Phosphatase: 99 U/L (ref 38–126)
BILIRUBIN TOTAL: 2.9 mg/dL — AB (ref 0.3–1.2)
BUN: 10 mg/dL (ref 6–20)
CHLORIDE: 103 mmol/L (ref 101–111)
CO2: 24 mmol/L (ref 22–32)
Calcium: 8.2 mg/dL — ABNORMAL LOW (ref 8.9–10.3)
Creatinine, Ser: 0.71 mg/dL (ref 0.44–1.00)
GFR calc Af Amer: >60 mL/min (ref >60)
GFR calc non Af Amer: >60 mL/min (ref >60)
GLUCOSE: 121 mg/dL — AB (ref 65–99)
POTASSIUM: 3.7 mmol/L (ref 3.5–5.1)
SODIUM: 133 mmol/L — AB (ref 135–145)
TOTAL PROTEIN: 7.2 g/dL (ref 6.5–8.1)

## 2016-11-25 LAB — TROPONIN I

## 2016-11-25 LAB — HIV ANTIBODY (ROUTINE TESTING W REFLEX): HIV SCREEN 4TH GENERATION: NONREACTIVE

## 2016-11-25 MED ORDER — ALBUMIN HUMAN 25 % IV SOLN
50.0000 g | Freq: Once | INTRAVENOUS | Status: AC
  Administered 2016-11-26: 50 g via INTRAVENOUS
  Filled 2016-11-25: qty 50

## 2016-11-25 MED ORDER — LIDOCAINE 2% (20 MG/ML) 5 ML SYRINGE
INTRAMUSCULAR | Status: AC
  Filled 2016-11-25: qty 10

## 2016-11-25 NOTE — Progress Notes (Signed)
Patient presented to radiology department for possible paracentesis.   Limited US Abdomen performed; results dictated separately.  Trace amount of fluid found today which is not amenable to paracentesis.   Linda DysKacie Skylen Danielsen, MS RD PA-C 3:56 PM

## 2016-11-25 NOTE — Progress Notes (Signed)
Otho DarnerJarryl D Flansburg is a 51 y.o. female patient admitted from ED awake, alert - oriented  X 4 - no acute distress noted.  VSS - Blood pressure 111/70, pulse 95, temperature 100 F (37.8 C), temperature source Oral, resp. rate 17, height 5' (1.524 m), weight 81.2 kg (179 lb 1.6 oz), SpO2 100 %.    IV in place, occlusive dsg intact without redness.  Orientation to room, and floor completed with information packet given to patient/family.  Patient declined safety video at this time.  Admission INP armband ID verified with patient/family, and in place.   SR up x 2, fall assessment complete, with patient and family able to verbalize understanding of risk associated with falls, and verbalized understanding to call nsg before up out of bed.  Call light within reach, patient able to voice, and demonstrate understanding.  Skin, clean-dry- intact without evidence of bruising, or skin tears.   No evidence of skin break down noted on exam.     Will cont to eval and treat per MD orders.  Eligah EastErin M Lindsay Straka, RN

## 2016-11-25 NOTE — Progress Notes (Signed)
PROGRESS NOTE    Linda DarnerJarryl D Tisdel  ZOX:[id number] DOB: 02/25/1965 DOA: 11/23/2016 PCP: Massie MaroonHollis, Lachina M, FNP   Brief Narrative: Linda Vaughn is a 51 y.o. female with a history of hepatitis C cirrhosis, ascites, depression, hepatic encephalopathy.  Patient presented with abdominal pain.  Paracentesis was performed and was suggestive of SBP and patient was admitted for IV antibiotics.  Abdominal pain has slightly improved but ascites has reaccumulated.  Preliminary fluid culture significant for gram-negative rods.   Assessment & Plan:   Principal Problem:   SBP (spontaneous bacterial peritonitis) ([facility]) Active Problems:   Hepatic cirrhosis ([facility])   Chronic hepatitis C without hepatic coma ([facility])   Abdominal pain   Nausea & vomiting   SBP Culture significant for gram negative rods. Still with abdominal pain -continue ceftriaxone -IR for repeat paracentesis -albumin after paracentesis  Hepatic cirrhosis Chronic hepatitis C -paracentesis as above -continue lactulose -continue ribavirin -continue Epclusa  Depression -continue Cymbalta   DVT prophylaxis: SCDs Code Status: Full code Family Communication: None at bedside Disposition Plan: Discharge pending medical improvement   Consultants:   Interventional radiology  Procedures:   Paracentesis (11/24/2016)  Antimicrobials:  Metronidazole (11/18)  Ciprofloxacin (11/18)  Ceftriaxone (11/19>>    Subjective: Abdominal pain.  No nausea or vomiting.  Objective: Vitals:   11/24/16 1630 11/24/16 1732 11/24/16 2132 11/25/16 0501  BP: 119/74 118/73 127/68 111/70  Pulse:  (!) 102 (!) 104 95  Resp: (!) 22 20 17 17   Temp:  98.2 F (36.8 C) 98.8 F (37.1 C) 100 F (37.8 C)  TempSrc:  Oral Oral Oral  SpO2:  100% 98% 100%  Weight:  70.6 kg (155 lb 9.6 oz)  81.2 kg (179 lb 1.6 oz)  Height:  5' (1.524 m)      Intake/Output Summary (Last 24 hours) at 11/25/2016 0956 Last data filed at 11/24/2016 1814 Gross per  24 hour  Intake 600 ml  Output -  Net 600 ml   Filed Weights   11/23/16 1750 11/24/16 1732 11/25/16 0501  Weight: 70.3 kg (155 lb) 70.6 kg (155 lb 9.6 oz) 81.2 kg (179 lb 1.6 oz)    Examination:  General exam: Appears calm and comfortable  Respiratory system: Clear to auscultation. Respiratory effort normal. Cardiovascular system: S1 & S2 heard, RRR. No murmurs, rubs, gallops or clicks. Gastrointestinal system: Abdomen is significantly distended, soft and tender diffusely. Normal bowel sounds heard. Central nervous system: Alert and oriented. No focal neurological deficits. Extremities: No edema. No calf tenderness Skin: No cyanosis. No rashes Psychiatry: Judgement and insight appear normal. Mood & affect appropriate.     Data Reviewed: I have personally reviewed following labs and imaging studies  CBC: Recent Labs  Lab 11/23/16 1819 11/25/16 0358  WBC 7.2 10.1  NEUTROABS 5.1  --   HGB 11.5* 8.9*  HCT 34.1* 26.2*  MCV 105.2* 105.6*  PLT 174 131*   Basic Metabolic Panel: Recent Labs  Lab 11/23/16 1819 11/25/16 0358  NA 135 133*  K 4.4 3.7  CL 108 103  CO2 22 24  GLUCOSE 115* 121*  BUN 12 10  CREATININE 0.86 0.71  CALCIUM 8.6* 8.2*   GFR: Estimated Creatinine Clearance: 76.8 mL/min (by C-G formula based on SCr of 0.71 mg/dL). Liver Function Tests: Recent Labs  Lab 11/23/16 1819 11/25/16 0358  AST 55* 38  ALT 31 22  ALKPHOS 145* 99  BILITOT 3.8* 2.9*  PROT 7.9 7.2  ALBUMIN 1.8* 2.6*   Recent Labs  Lab 11/24/16  1155  LIPASE 28   Recent Labs  Lab 11/23/16 1854  AMMONIA 84*   Coagulation Profile: No results for input(s): INR, PROTIME in the last 168 hours. Cardiac Enzymes: Recent Labs  Lab 11/24/16 1155 11/24/16 1608 11/24/16 2235  TROPONINI <0.03 <0.03 <0.03   BNP (last 3 results) No results for input(s): PROBNP in the last 8760 hours. HbA1C: No results for input(s): HGBA1C in the last 72 hours. CBG: No results for input(s): GLUCAP  in the last 168 hours. Lipid Profile: No results for input(s): CHOL, HDL, LDLCALC, TRIG, CHOLHDL, LDLDIRECT in the last 72 hours. Thyroid Function Tests: No results for input(s): TSH, T4TOTAL, FREET4, T3FREE, THYROIDAB in the last 72 hours. Anemia Panel: No results for input(s): VITAMINB12, FOLATE, FERRITIN, TIBC, IRON, RETICCTPCT in the last 72 hours. Sepsis Labs: Recent Labs  Lab 11/23/16 1826  LATICACIDVEN 1.54    Recent Results (from the past 240 hour(s))  Culture, body fluid-bottle     Status: None (Preliminary result)   Collection Time: 11/24/16 12:29 AM  Result Value Ref Range Status   Specimen Description PERITONEAL FLUID  Final   Special Requests NONE  Final   Gram Stain   Final    GRAM NEGATIVE RODS IN BOTH AEROBIC AND ANAEROBIC BOTTLES    Culture TOO YOUNG TO READ  Final   Report Status PENDING  Incomplete  Gram stain     Status: None   Collection Time: 11/24/16 12:29 AM  Result Value Ref Range Status   Specimen Description PERITONEAL FLUID  Final   Special Requests NONE  Final   Gram Stain   Final    CYTOSPIN SMEAR WBC PRESENT,BOTH PMN AND MONONUCLEAR NO ORGANISMS SEEN    Report Status 11/24/2016 FINAL  Final         Radiology Studies: Ct Abdomen Pelvis Wo Contrast  Result Date: 11/24/2016 CLINICAL DATA:  Initial evaluation for acute abdominal pain. EXAM: CT ABDOMEN AND PELVIS WITHOUT CONTRAST TECHNIQUE: Multidetector CT imaging of the abdomen and pelvis was performed following the standard protocol without IV contrast. COMPARISON:  Prior CT from 10/04/2016. FINDINGS: Lower chest: Moderate right pleural effusion with associated right basilar atelectasis/ consolidation. Hazy atelectatic changes present within the left lung base as well. Hepatobiliary: Hepatic cirrhosis. No focal intrahepatic lesions identified on this noncontrast examination. Gallbladder surgically absent. No appreciable biliary dilatation. Pancreas: Pancreas within normal limits. Spleen:  Spleen within normal limits. Adrenals/Urinary Tract: Adrenal glands grossly unremarkable. Kidneys equal in size without nephrolithiasis or hydronephrosis. No appreciable hydroureter. Bladder within normal limits. Stomach/Bowel: Stomach decompressed without acute abnormality. No evidence for bowel obstruction. No acute inflammatory changes seen about the bowels on this noncontrast examination. Vascular/Lymphatic: Aortic atherosclerosis. No aneurysm. Porta hepatis lymph nodes measuring up to 14 mm noted, likely related intrinsic liver disease. No other adenopathy identified on this noncontrast examination. Reproductive: Uterus and ovaries within normal limits. Other: No free intraperitoneal air. Moderate to large volume ascites with mesenteric edema. Complex paraumbilical hernia containing fluid is increased in size measuring 9.8 x 5.2 cm (series 3, image 56). More inferior component measures 6.2 cm. Musculoskeletal: Diffuse anasarca. Few scattered foci of soft tissue gas within the subcutaneous fat of the left abdominal wall likely related to recent paracentesis. No acute osseus abnormality. No worrisome lytic or blastic osseous lesions. IMPRESSION: 1. Hepatic cirrhosis with moderate to large volume ascites with diffuse anasarca. 2. Moderate right pleural effusion with associated right basilar atelectasis/consolidation. 3. Interval increase in size of complex fluid containing paraumbilical hernias as above.  4. Sequelae of recent paracentesis with scattered soft tissue emphysema within the subcutaneous fat of the lower left abdominal wall. Electronically Signed   By: Rise Mu M.D.   On: 11/24/2016 06:44        Scheduled Meds: . DULoxetine  20 mg Oral Daily  . lactulose  10 g Oral BID  . ribavirin  600 mg Oral Daily   And  . ribavirin  400 mg Oral QHS  . rifaximin  550 mg Oral BID  . sodium chloride flush  3 mL Intravenous Q12H  . Sofosbuvir-Velpatasvir  1 tablet Oral Daily  . spironolactone   100 mg Oral Daily   Continuous Infusions: . sodium chloride    . albumin human    . cefTRIAXone (ROCEPHIN)  IV Stopped (11/24/16 1814)     LOS: 1 day     Jacquelin Hawking, MD [facility] 11/25/2016, 9:56 AM Pager: [phone]  If 7PM-7AM, please contact night-coverage [email or web address] Password [facility] 11/25/2016, 9:56 AM

## 2016-11-26 DIAGNOSIS — K652 Spontaneous bacterial peritonitis: Principal | ICD-10-CM

## 2016-11-26 LAB — CBC
HEMATOCRIT: 27.1 % — AB (ref 36.0–46.0)
HEMOGLOBIN: 9.2 g/dL — AB (ref 12.0–15.0)
MCH: 36.1 pg — ABNORMAL HIGH (ref 26.0–34.0)
MCHC: 33.9 g/dL (ref 30.0–36.0)
MCV: 106.3 fL — AB (ref 78.0–100.0)
Platelets: 137 10*3/uL — ABNORMAL LOW (ref 150–400)
RBC: 2.55 MIL/uL — AB (ref 3.87–5.11)
RDW: 17.8 % — AB (ref 11.5–15.5)
WBC: 8.6 10*3/uL (ref 4.0–10.5)

## 2016-11-26 LAB — BASIC METABOLIC PANEL
ANION GAP: 4 — AB (ref 5–15)
BUN: 7 mg/dL (ref 6–20)
CHLORIDE: 101 mmol/L (ref 101–111)
CO2: 26 mmol/L (ref 22–32)
Calcium: 8.3 mg/dL — ABNORMAL LOW (ref 8.9–10.3)
Creatinine, Ser: 0.66 mg/dL (ref 0.44–1.00)
GFR calc Af Amer: >60 mL/min (ref >60)
Glucose, Bld: 126 mg/dL — ABNORMAL HIGH (ref 65–99)
POTASSIUM: 3.8 mmol/L (ref 3.5–5.1)
SODIUM: 131 mmol/L — AB (ref 135–145)

## 2016-11-26 MED ORDER — RIBAVIRIN 200 MG PO CAPS
600.0000 mg | ORAL_CAPSULE | Freq: Every day | ORAL | Status: DC
  Administered 2016-11-27 – 2016-11-28 (×2): 600 mg via ORAL
  Filled 2016-11-26 (×2): qty 3

## 2016-11-26 MED ORDER — RIBAVIRIN 200 MG PO CAPS
400.0000 mg | ORAL_CAPSULE | Freq: Every day | ORAL | Status: DC
  Administered 2016-11-26 – 2016-11-27 (×2): 400 mg via ORAL
  Filled 2016-11-26 (×2): qty 2

## 2016-11-26 MED ORDER — DEXTROSE 5 % IV SOLN
2.0000 g | INTRAVENOUS | Status: DC
  Administered 2016-11-27: 2 g via INTRAVENOUS
  Filled 2016-11-26 (×2): qty 2

## 2016-11-26 NOTE — Progress Notes (Signed)
PROGRESS NOTE    Linda Vaughn  JXB:[id number] DOB: [birth year] DOA: 11/23/2016 PCP: Massie MaroonHollis, Lachina M, FNP   Brief Narrative: Linda Vaughn is a 51 y.o. female with a history of hepatitis C cirrhosis, ascites, depression, hepatic encephalopathy.  Patient presented with abdominal pain.  Paracentesis was performed and was suggestive of SBP and patient was admitted for IV antibiotics.  Abdominal pain has slightly improved but ascites has reaccumulated.  Preliminary fluid culture significant for gram-negative rods.   Assessment & Plan:   Principal Problem:   SBP (spontaneous bacterial peritonitis) (HCC) Active Problems:   Hepatic cirrhosis (HCC)   Chronic hepatitis C without hepatic coma (HCC)   Abdominal pain   Nausea & vomiting   SBP Culture significant for gram negative rods. Still with abdominal pain -improving on ceftriaxone, will continue -IR for repeat paracentesis but stated not enough to draw from  Hepatic cirrhosis Chronic hepatitis C -continue lactulose -continue ribavirin -continue Epclusa  Depression -continue Cymbalta  DVT prophylaxis: SCDs Code Status: Full code Family Communication: None at bedside Disposition Plan: Discharge pending medical improvement   Consultants:   Interventional radiology  Procedures:   Paracentesis (11/24/2016)  Antimicrobials:  Metronidazole (11/18)  Ciprofloxacin (11/18)  Ceftriaxone (11/19>>    Subjective: Still complaining of abdominal pain. But reports getting better.  Objective: Vitals:   11/25/16 1422 11/25/16 2158 11/26/16 0520 11/26/16 1411  BP: 113/68 127/82 124/74 106/69  Pulse: 94 86 82 83  Resp: 18 18 18 18   Temp: 98.2 F (36.8 C) 98.1 F (36.7 C) 98.2 F (36.8 C) 98.6 F (37 C)  TempSrc: Oral Oral Oral Oral  SpO2: 100% 100% 100% 98%  Weight:   82 kg (180 lb 11.2 oz)   Height:        Intake/Output Summary (Last 24 hours) at 11/26/2016 1729 Last data filed at 11/26/2016 1642 Gross per  24 hour  Intake 50 ml  Output -  Net 50 ml   Filed Weights   11/24/16 1732 11/25/16 0501 11/26/16 0520  Weight: 70.6 kg (155 lb 9.6 oz) 81.2 kg (179 lb 1.6 oz) 82 kg (180 lb 11.2 oz)    Examination:  General exam: Appears calm and comfortable, in nad. Respiratory system: Clear to auscultation. Respiratory effort normal. Equal chest rise. Cardiovascular system: S1 & S2 heard, RRR. No murmurs, rubs, gallops or clicks. Gastrointestinal system: Abdomen is significantly distended, soft and tender diffusely. Normal bowel sounds heard. Central nervous system: Alert and oriented. No focal neurological deficits. Extremities: No edema. No calf tenderness Skin: No cyanosis. No rashes Psychiatry: Judgement and insight appear normal. Mood & affect appropriate.     Data Reviewed: I have personally reviewed following labs and imaging studies  CBC: Recent Labs  Lab 11/23/16 1819 11/25/16 0358 11/26/16 0634  WBC 7.2 10.1 8.6  NEUTROABS 5.1  --   --   HGB 11.5* 8.9* 9.2*  HCT 34.1* 26.2* 27.1*  MCV 105.2* 105.6* 106.3*  PLT 174 131* 137*   Basic Metabolic Panel: Recent Labs  Lab 11/23/16 1819 11/25/16 0358 11/26/16 0634  NA 135 133* 131*  K 4.4 3.7 3.8  CL 108 103 101  CO2 22 24 26   GLUCOSE 115* 121* 126*  BUN 12 10 7   CREATININE 0.86 0.71 0.66  CALCIUM 8.6* 8.2* 8.3*   GFR: Estimated Creatinine Clearance: 77.2 mL/min (by C-G formula based on SCr of 0.66 mg/dL). Liver Function Tests: Recent Labs  Lab 11/23/16 1819 11/25/16 0358  AST 55* 38  ALT 31  22  ALKPHOS 145* 99  BILITOT 3.8* 2.9*  PROT 7.9 7.2  ALBUMIN 1.8* 2.6*   Recent Labs  Lab 11/24/16 1155  LIPASE 28   Recent Labs  Lab 11/23/16 1854  AMMONIA 84*   Coagulation Profile: No results for input(s): INR, PROTIME in the last 168 hours. Cardiac Enzymes: Recent Labs  Lab 11/24/16 1155 11/24/16 1608 11/24/16 2235  TROPONINI <0.03 <0.03 <0.03   BNP (last 3 results) No results for input(s): PROBNP  in the last 8760 hours. HbA1C: No results for input(s): HGBA1C in the last 72 hours. CBG: No results for input(s): GLUCAP in the last 168 hours. Lipid Profile: No results for input(s): CHOL, HDL, LDLCALC, TRIG, CHOLHDL, LDLDIRECT in the last 72 hours. Thyroid Function Tests: No results for input(s): TSH, T4TOTAL, FREET4, T3FREE, THYROIDAB in the last 72 hours. Anemia Panel: No results for input(s): VITAMINB12, FOLATE, FERRITIN, TIBC, IRON, RETICCTPCT in the last 72 hours. Sepsis Labs: Recent Labs  Lab 11/23/16 1826  LATICACIDVEN 1.54    Recent Results (from the past 240 hour(s))  Culture, body fluid-bottle     Status: None (Preliminary result)   Collection Time: 11/24/16 12:29 AM  Result Value Ref Range Status   Specimen Description PERITONEAL FLUID  Final   Special Requests NONE  Final   Gram Stain   Final    GRAM NEGATIVE RODS IN BOTH AEROBIC AND ANAEROBIC BOTTLES    Culture   Final    ABUNDANT GRAM NEGATIVE RODS IDENTIFICATION AND SUSCEPTIBILITIES TO FOLLOW    Report Status PENDING  Incomplete  Gram stain     Status: None   Collection Time: 11/24/16 12:29 AM  Result Value Ref Range Status   Specimen Description PERITONEAL FLUID  Final   Special Requests NONE  Final   Gram Stain   Final    CYTOSPIN SMEAR WBC PRESENT,BOTH PMN AND MONONUCLEAR NO ORGANISMS SEEN    Report Status 11/24/2016 FINAL  Final         Radiology Studies: Ir Abdomen [place] Limited  Result Date: 11/25/2016 CLINICAL DATA:  Ascites. EXAM: LIMITED ABDOMEN ULTRASOUND FOR ASCITES TECHNIQUE: Limited ultrasound survey for ascites was performed in all four abdominal quadrants. COMPARISON:  None. FINDINGS: By ultrasound, there is only a small amount of ascites in the peritoneal cavity. No appropriate pocket was identified to allow paracentesis. IMPRESSION: Small amount of ascites in the peritoneal cavity. Large enough pocket was not identified to allow paracentesis. Electronically Signed   By: Irish LackGlenn   Yamagata M.D.   On: 11/25/2016 16:05        Scheduled Meds: . DULoxetine  20 mg Oral Daily  . lactulose  10 g Oral BID  . ribavirin  600 mg Oral Daily   And  . ribavirin  400 mg Oral QHS  . rifaximin  550 mg Oral BID  . sodium chloride flush  3 mL Intravenous Q12H  . Sofosbuvir-Velpatasvir  1 tablet Oral Daily  . spironolactone  100 mg Oral Daily   Continuous Infusions: . sodium chloride    . cefTRIAXone (ROCEPHIN)  IV 2 g (11/26/16 1716)     LOS: 2 days     Penny PiaVEGA, Nathanael Krist, MD [facility] 11/26/2016, 5:29 PM Pager: [phone]  If 7PM-7AM, please contact night-coverage [email or web address] Password [email or web address] 11/26/2016, 5:29 PM

## 2016-11-27 LAB — CULTURE, BODY FLUID-BOTTLE

## 2016-11-27 LAB — CULTURE, BODY FLUID W GRAM STAIN -BOTTLE

## 2016-11-27 NOTE — Progress Notes (Signed)
PROGRESS NOTE    Otho DarnerJarryl D Pallas  ZOX:[id number] DOB: 12-25-1965 DOA: 11/23/2016 PCP: Massie MaroonHollis, Lachina M, FNP   Brief Narrative: Otho DarnerJarryl D Brandel is a 51 y.o. female with a history of hepatitis C cirrhosis, ascites, depression, hepatic encephalopathy.  Patient presented with abdominal pain.  Paracentesis was performed and was suggestive of SBP and patient was admitted for IV antibiotics.  Abdominal pain has slightly improved but ascites has reaccumulated.  Preliminary fluid culture significant for gram-negative rods.  Assessment & Plan:   Principal Problem:   SBP (spontaneous bacterial peritonitis) (HCC) Active Problems:   Hepatic cirrhosis (HCC)   Chronic hepatitis C without hepatic coma (HCC)   Abdominal pain   Nausea & vomiting  SBP Culture significant for gram negative rods. Still with abdominal pain - improving on ceftriaxone, will continue - still complaining of pain but improving.   Hepatic cirrhosis Chronic hepatitis C -continue lactulose -continue ribavirin -continue Epclusa  Depression -continue Cymbalta  DVT prophylaxis: SCDs Code Status: Full code Family Communication: None at bedside Disposition Plan: Discharge next am with continued improvement    Consultants:   Interventional radiology  Procedures:   Paracentesis (11/24/2016)  Antimicrobials:  Metronidazole (11/18)  Ciprofloxacin (11/18)  Ceftriaxone (11/19>>    Subjective: Pt has no new complaints currently.  Objective: Vitals:   11/26/16 2009 11/26/16 2045 11/27/16 0521 11/27/16 0833  BP: 117/66 109/70 (!) 101/57 118/61  Pulse: 84 90 89 86  Resp:  17 17   Temp: 98.5 F (36.9 C) 98.3 F (36.8 C) 98.2 F (36.8 C)   TempSrc: Oral Oral Oral   SpO2: 100% 100% 98%   Weight:   81.6 kg (180 lb)   Height:        Intake/Output Summary (Last 24 hours) at 11/27/2016 1352 Last data filed at 11/26/2016 2328 Gross per 24 hour  Intake 53 ml  Output -  Net 53 ml   Filed Weights   11/25/16 0501 11/26/16 0520 11/27/16 0521  Weight: 81.2 kg (179 lb 1.6 oz) 82 kg (180 lb 11.2 oz) 81.6 kg (180 lb)    Examination:  General exam: Pt in nad, alert and awake Respiratory system: equal chest rise. Clear to auscultation. Respiratory effort normal.  Cardiovascular system: S1 & S2 heard, RRR. No murmurs, rubs, gallops or clicks. Gastrointestinal system: Abdomen is  distended, soft and tender diffusely. Normal bowel sounds heard. Central nervous system: Alert and oriented. No focal neurological deficits. Extremities: No edema. No calf tenderness Skin: No cyanosis. No rashes Psychiatry: Judgement and insight appear normal. Mood & affect appropriate.     Data Reviewed: I have personally reviewed following labs and imaging studies  CBC: Recent Labs  Lab 11/23/16 1819 11/25/16 0358 11/26/16 0634  WBC 7.2 10.1 8.6  NEUTROABS 5.1  --   --   HGB 11.5* 8.9* 9.2*  HCT 34.1* 26.2* 27.1*  MCV 105.2* 105.6* 106.3*  PLT 174 131* 137*   Basic Metabolic Panel: Recent Labs  Lab 11/23/16 1819 11/25/16 0358 11/26/16 0634  NA 135 133* 131*  K 4.4 3.7 3.8  CL 108 103 101  CO2 22 24 26   GLUCOSE 115* 121* 126*  BUN 12 10 7   CREATININE 0.86 0.71 0.66  CALCIUM 8.6* 8.2* 8.3*   GFR: Estimated Creatinine Clearance: 76.9 mL/min (by C-G formula based on SCr of 0.66 mg/dL). Liver Function Tests: Recent Labs  Lab 11/23/16 1819 11/25/16 0358  AST 55* 38  ALT 31 22  ALKPHOS 145* 99  BILITOT 3.8* 2.9*  PROT 7.9 7.2  ALBUMIN 1.8* 2.6*   Recent Labs  Lab 11/24/16 1155  LIPASE 28   Recent Labs  Lab 11/23/16 1854  AMMONIA 84*   Coagulation Profile: No results for input(s): INR, PROTIME in the last 168 hours. Cardiac Enzymes: Recent Labs  Lab 11/24/16 1155 11/24/16 1608 11/24/16 2235  TROPONINI <0.03 <0.03 <0.03   BNP (last 3 results) No results for input(s): PROBNP in the last 8760 hours. HbA1C: No results for input(s): HGBA1C in the last 72 hours. CBG: No  results for input(s): GLUCAP in the last 168 hours. Lipid Profile: No results for input(s): CHOL, HDL, LDLCALC, TRIG, CHOLHDL, LDLDIRECT in the last 72 hours. Thyroid Function Tests: No results for input(s): TSH, T4TOTAL, FREET4, T3FREE, THYROIDAB in the last 72 hours. Anemia Panel: No results for input(s): VITAMINB12, FOLATE, FERRITIN, TIBC, IRON, RETICCTPCT in the last 72 hours. Sepsis Labs: Recent Labs  Lab 11/23/16 1826  LATICACIDVEN 1.54    Recent Results (from the past 240 hour(s))  Culture, body fluid-bottle     Status: Abnormal   Collection Time: 11/24/16 12:29 AM  Result Value Ref Range Status   Specimen Description PERITONEAL FLUID  Final   Special Requests NONE  Final   Gram Stain   Final    GRAM NEGATIVE RODS IN BOTH AEROBIC AND ANAEROBIC BOTTLES CRITICAL RESULT CALLED TO, READ BACK BY AND VERIFIED WITH: K PRICE,RN AT [id number] 11/27/16 BY L BENFIELD    Culture ESCHERICHIA COLI (A)  Final   Report Status 11/27/2016 FINAL  Final   Organism ID, Bacteria ESCHERICHIA COLI  Final      Susceptibility   Escherichia coli - MIC*    AMPICILLIN >=32 RESISTANT Resistant     CEFAZOLIN <=4 SENSITIVE Sensitive     CEFEPIME <=1 SENSITIVE Sensitive     CEFTAZIDIME <=1 SENSITIVE Sensitive     CEFTRIAXONE <=1 SENSITIVE Sensitive     CIPROFLOXACIN >=4 RESISTANT Resistant     GENTAMICIN >=16 RESISTANT Resistant     IMIPENEM <=0.25 SENSITIVE Sensitive     TRIMETH/SULFA >=320 RESISTANT Resistant     AMPICILLIN/SULBACTAM 16 INTERMEDIATE Intermediate     PIP/TAZO <=4 SENSITIVE Sensitive     Extended ESBL NEGATIVE Sensitive     * ESCHERICHIA COLI  Gram stain     Status: None   Collection Time: 11/24/16 12:29 AM  Result Value Ref Range Status   Specimen Description PERITONEAL FLUID  Final   Special Requests NONE  Final   Gram Stain   Final    CYTOSPIN SMEAR WBC PRESENT,BOTH PMN AND MONONUCLEAR NO ORGANISMS SEEN    Report Status 11/24/2016 FINAL  Final     Radiology Studies: Ir  Abdomen [place] Limited  Result Date: 11/25/2016 CLINICAL DATA:  Ascites. EXAM: LIMITED ABDOMEN ULTRASOUND FOR ASCITES TECHNIQUE: Limited ultrasound survey for ascites was performed in all four abdominal quadrants. COMPARISON:  None. FINDINGS: By ultrasound, there is only a small amount of ascites in the peritoneal cavity. No appropriate pocket was identified to allow paracentesis. IMPRESSION: Small amount of ascites in the peritoneal cavity. Large enough pocket was not identified to allow paracentesis. Electronically Signed   By: Irish LackGlenn  Yamagata M.D.   On: 11/25/2016 16:05    Scheduled Meds: . DULoxetine  20 mg Oral Daily  . lactulose  10 g Oral BID  . ribavirin  400 mg Oral QHS   And  . ribavirin  600 mg Oral Daily  . rifaximin  550 mg Oral BID  . sodium chloride  flush  3 mL Intravenous Q12H  . Sofosbuvir-Velpatasvir  1 tablet Oral Daily  . spironolactone  100 mg Oral Daily   Continuous Infusions: . sodium chloride    . cefTRIAXone (ROCEPHIN)  IV       LOS: 3 days   Penny Pia, MD [facility] 11/27/2016, 1:52 PM Pager: [phone]  If 7PM-7AM, please contact night-coverage [email or web address] Password [email or web address] 11/27/2016, 1:52 PM

## 2016-11-27 NOTE — Progress Notes (Signed)
Patient is alert and verbal, requesting for pain medication at beginning of shift, prn pain med is not yet due to be given, patient was distracted, encouraged to reposition.  Pain medication given at 830pm.  IV infiltrated and discontinued, a new iv inserted at 1130pm, patient tolerated the insertion no acute distress noted

## 2016-11-28 MED ORDER — OXYCODONE HCL 5 MG PO TABS: 5.0000 mg | ORAL_TABLET | ORAL | 0 refills | Status: DC | PRN

## 2016-11-28 MED ORDER — CEFDINIR 300 MG PO CAPS: 300.0000 mg | ORAL_CAPSULE | Freq: Two times a day (BID) | ORAL | 0 refills | Status: AC

## 2016-11-28 NOTE — Progress Notes (Signed)
Linda Vaughn to be D/C'd to home per MD order.  Discussed with the patient and all questions fully answered.  VSS, Skin clean, dry and intact without evidence of skin break down, no evidence of skin tears noted. IV catheter discontinued intact. Site without signs and symptoms of complications. Dressing and pressure applied.  An After Visit Summary was printed and given to the patient. Patient received prescriptions.  D/c education completed with patient/family including follow up instructions, medication list, d/c activities limitations if indicated, with other d/c instructions as indicated by MD - patient able to verbalize understanding, all questions fully answered.   Patient instructed to return to ED, call 911, or call MD for any changes in condition.   Patient escorted via WC, and D/C home via private auto.  [name] 11/28/2016 3:22 PM

## 2016-11-28 NOTE — Discharge Summary (Signed)
Physician Discharge Summary  Linda Vaughn NGE:[id number] DOB: 08-13-1965 DOA: 11/23/2016  PCP: [name], FNP  Admit date: 11/23/2016 Discharge date: 11/28/2016  Time spent: > 35 minutes  Recommendations for Outpatient Follow-up:  1.    Discharge Diagnoses:  Principal Problem:   SBP (spontaneous bacterial peritonitis) (HCC) Active Problems:   Hepatic cirrhosis (HCC)   Chronic hepatitis C without hepatic coma (HCC)   Abdominal pain   Nausea & vomiting   Discharge Condition: stable  Diet recommendation: low sodium diet  Filed Weights   11/26/16 0520 11/27/16 0521 11/28/16 0441  Weight: 82 kg (180 lb 11.2 oz) 81.6 kg (180 lb) 81.6 kg (180 lb)    History of present illness:  51 y.o. female with a history of hepatitis C cirrhosis, ascites, depression, hepatic encephalopathy.  Patient presented with abdominal pain.  Paracentesis was performed and was suggestive of SBP and patient was admitted for IV antibiotics.    Hospital Course:  SBP - Will treat for 4 more days to complete a seven-day total treatment course. Discharged on Omnicef 300 mg by mouth twice a day. For treatment of Escherichia coli. Was susceptible to Rocephin as such will discharge on third generation cephalosporin. Discussed with pharmacist - Discharge with oral pain medication  Otherwise for no medical conditions we'll continue prior to admission medication regimen. Medical conditions listed above and stable  Procedures:  Paracentesis  Consultations:  None  Discharge Exam: Vitals:   11/27/16 1401 11/28/16 0518  BP: 111/65 120/64  Pulse: 76 75  Resp: 14 14  Temp: 98.3 F (36.8 C) 98.7 F (37.1 C)  SpO2: 99% 100%    General: Patient in no acute distress, alert and awake Cardiovascular: Regular rate and rhythm, no murmurs rubs Respiratory: No increased work of breathing  Discharge Instructions   Discharge Instructions    Call MD for:  severe uncontrolled pain   Complete by:  As  directed    Call MD for:  temperature >100.4   Complete by:  As directed    Diet - low sodium heart healthy   Complete by:  As directed    Discharge instructions   Complete by:  As directed    Please be sure to follow up with your primary care physician in the next 1-2 weeks or sooner should any new concerns arise.   Increase activity slowly   Complete by:  As directed      Current Discharge Medication List    START taking these medications   Details  cefdinir (OMNICEF) 300 MG capsule Take 1 capsule (300 mg total) by mouth 2 (two) times daily for 4 days. Qty: 8 capsule, Refills: 0    oxyCODONE (OXY IR/ROXICODONE) 5 MG immediate release tablet Take 1 tablet (5 mg total) by mouth every 4 (four) hours as needed for severe pain. Qty: 30 tablet, Refills: 0      CONTINUE these medications which have NOT CHANGED   Details  DULoxetine (CYMBALTA) 20 MG capsule Take 20 mg daily by mouth.    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 Take three tablets (600mg ) in the morning and two tablets (400mg ) in the afternoon-for treamnt of chronic hep C.    Sofosbuvir-Velpatasvir 400-100 MG TABS Take 1 tablet daily by mouth.    spironolactone (  ALDACTONE) 100 MG tablet TAKE 1 TABLET (100 MG TOTAL) BY MOUTH DAILY. Qty: 30 tablet, Refills: 0    docusate sodium (COLACE) 100 MG capsule Take 1 capsule (100 mg total) by mouth every 12 (twelve) hours. Qty: 60 capsule, Refills: 0       Allergies  Allergen Reactions  . Aspirin Other (See Comments)    Liver damage   . Ibuprofen Other (See Comments)    Liver damage  . Tylenol [Acetaminophen] Other (See Comments)    Liver damage      The results of significant diagnostics from this hospitalization (including imaging, microbiology, ancillary  and laboratory) are listed below for reference.    Significant Diagnostic Studies: Ct Abdomen Pelvis Wo Contrast  Result Date: 11/24/2016 CLINICAL DATA:  Initial evaluation for acute abdominal pain. EXAM: CT ABDOMEN AND PELVIS WITHOUT CONTRAST TECHNIQUE: Multidetector CT imaging of the abdomen and pelvis was performed following the standard protocol without IV contrast. COMPARISON:  Prior CT from 10/04/2016. FINDINGS: Lower chest: Moderate right pleural effusion with associated right basilar atelectasis/ consolidation. Hazy atelectatic changes present within the left lung base as well. Hepatobiliary: Hepatic cirrhosis. No focal intrahepatic lesions identified on this noncontrast examination. Gallbladder surgically absent. No appreciable biliary dilatation. Pancreas: Pancreas within normal limits. Spleen: Spleen within normal limits. Adrenals/Urinary Tract: Adrenal glands grossly unremarkable. Kidneys equal in size without nephrolithiasis or hydronephrosis. No appreciable hydroureter. Bladder within normal limits. Stomach/Bowel: Stomach decompressed without acute abnormality. No evidence for bowel obstruction. No acute inflammatory changes seen about the bowels on this noncontrast examination. Vascular/Lymphatic: Aortic atherosclerosis. No aneurysm. Porta hepatis lymph nodes measuring up to 14 mm noted, likely related intrinsic liver disease. No other adenopathy identified on this noncontrast examination. Reproductive: Uterus and ovaries within normal limits. Other: No free intraperitoneal air. Moderate to large volume ascites with mesenteric edema. Complex paraumbilical hernia containing fluid is increased in size measuring 9.8 x 5.2 cm (series 3, image 56). More inferior component measures 6.2 cm. Musculoskeletal: Diffuse anasarca. Few scattered foci of soft tissue gas within the subcutaneous fat of the left abdominal wall likely related to recent paracentesis. No acute osseus abnormality. No worrisome lytic  or blastic osseous lesions. IMPRESSION: 1. Hepatic cirrhosis with moderate to large volume ascites with diffuse anasarca. 2. Moderate right pleural effusion with associated right basilar atelectasis/consolidation. 3. Interval increase in size of complex fluid containing paraumbilical hernias as above. 4. Sequelae of recent paracentesis with scattered soft tissue emphysema within the subcutaneous fat of the lower left abdominal wall. Electronically Signed   By: Rise MuBenjamin  McClintock M.D.   On: 11/24/2016 06:44   Ir Abdomen [place] Limited  Result Date: 11/25/2016 CLINICAL DATA:  Ascites. EXAM: LIMITED ABDOMEN ULTRASOUND FOR ASCITES TECHNIQUE: Limited ultrasound survey for ascites was performed in all four abdominal quadrants. COMPARISON:  None. FINDINGS: By ultrasound, there is only a small amount of ascites in the peritoneal cavity. No appropriate pocket was identified to allow paracentesis. IMPRESSION: Small amount of ascites in the peritoneal cavity. Large enough pocket was not identified to allow paracentesis. Electronically Signed   By: Irish LackGlenn  Yamagata M.D.   On: 11/25/2016 16:05    Microbiology: Recent Results (from the past 240 hour(s))  Culture, body fluid-bottle     Status: Abnormal   Collection Time: 11/24/16 12:29 AM  Result Value Ref Range Status   Specimen Description PERITONEAL FLUID  Final   Special Requests NONE  Final   Gram Stain   Final    GRAM NEGATIVE RODS IN BOTH AEROBIC  AND ANAEROBIC BOTTLES CRITICAL RESULT CALLED TO, READ BACK BY AND VERIFIED WITH: K PRICE,RN AT [id number] 11/27/16 BY L BENFIELD    Culture ESCHERICHIA COLI (A)  Final   Report Status 11/27/2016 FINAL  Final   Organism ID, Bacteria ESCHERICHIA COLI  Final      Susceptibility   Escherichia coli - MIC*    AMPICILLIN >=32 RESISTANT Resistant     CEFAZOLIN <=4 SENSITIVE Sensitive     CEFEPIME <=1 SENSITIVE Sensitive     CEFTAZIDIME <=1 SENSITIVE Sensitive     CEFTRIAXONE <=1 SENSITIVE Sensitive     CIPROFLOXACIN  >=4 RESISTANT Resistant     GENTAMICIN >=16 RESISTANT Resistant     IMIPENEM <=0.25 SENSITIVE Sensitive     TRIMETH/SULFA >=320 RESISTANT Resistant     AMPICILLIN/SULBACTAM 16 INTERMEDIATE Intermediate     PIP/TAZO <=4 SENSITIVE Sensitive     Extended ESBL NEGATIVE Sensitive     * ESCHERICHIA COLI  Gram stain     Status: None   Collection Time: 11/24/16 12:29 AM  Result Value Ref Range Status   Specimen Description PERITONEAL FLUID  Final   Special Requests NONE  Final   Gram Stain   Final    CYTOSPIN SMEAR WBC PRESENT,BOTH PMN AND MONONUCLEAR NO ORGANISMS SEEN    Report Status 11/24/2016 FINAL  Final     Labs: Basic Metabolic Panel: Recent Labs  Lab 11/23/16 1819 11/25/16 0358 11/26/16 0634  NA 135 133* 131*  K 4.4 3.7 3.8  CL 108 103 101  CO2 22 24 26   GLUCOSE 115* 121* 126*  BUN 12 10 7   CREATININE 0.86 0.71 0.66  CALCIUM 8.6* 8.2* 8.3*   Liver Function Tests: Recent Labs  Lab 11/23/16 1819 11/25/16 0358  AST 55* 38  ALT 31 22  ALKPHOS 145* 99  BILITOT 3.8* 2.9*  PROT 7.9 7.2  ALBUMIN 1.8* 2.6*   Recent Labs  Lab 11/24/16 1155  LIPASE 28   Recent Labs  Lab 11/23/16 1854  AMMONIA 84*   CBC: Recent Labs  Lab 11/23/16 1819 11/25/16 0358 11/26/16 0634  WBC 7.2 10.1 8.6  NEUTROABS 5.1  --   --   HGB 11.5* 8.9* 9.2*  HCT 34.1* 26.2* 27.1*  MCV 105.2* 105.6* 106.3*  PLT 174 131* 137*   Cardiac Enzymes: Recent Labs  Lab 11/24/16 1155 11/24/16 1608 11/24/16 2235  TROPONINI <0.03 <0.03 <0.03   BNP: BNP (last 3 results) No results for input(s): BNP in the last 8760 hours.  ProBNP (last 3 results) No results for input(s): PROBNP in the last 8760 hours.  CBG: No results for input(s): GLUCAP in the last 168 hours.     Signed:  Penny PiaVEGA, Orean Giarratano MD.  [facility] 11/28/2016, 2:08 PM

## 2016-12-08 ENCOUNTER — Ambulatory Visit: Payer: Medicaid Other | Admitting: Family Medicine

## 2016-12-10 ENCOUNTER — Other Ambulatory Visit: Payer: Self-pay | Admitting: Family Medicine

## 2016-12-18 ENCOUNTER — Ambulatory Visit: Payer: Medicaid Other | Admitting: Family Medicine

## 2016-12-19 ENCOUNTER — Encounter (HOSPITAL_COMMUNITY): Payer: Self-pay | Admitting: Emergency Medicine

## 2016-12-19 ENCOUNTER — Other Ambulatory Visit: Payer: Self-pay

## 2016-12-19 ENCOUNTER — Emergency Department (HOSPITAL_COMMUNITY)
Admission: EM | Admit: 2016-12-19 | Discharge: 2016-12-20 | Disposition: A | Discharge status: Home / Self Care | Payer: Medicaid Other | Attending: Emergency Medicine | Admitting: Emergency Medicine

## 2016-12-19 DIAGNOSIS — R1084 Generalized abdominal pain: Secondary | ICD-10-CM

## 2016-12-19 DIAGNOSIS — F17210 Nicotine dependence, cigarettes, uncomplicated: Secondary | ICD-10-CM | POA: Insufficient documentation

## 2016-12-19 DIAGNOSIS — I10 Essential (primary) hypertension: Secondary | ICD-10-CM | POA: Diagnosis not present

## 2016-12-19 DIAGNOSIS — K7031 Alcoholic cirrhosis of liver with ascites: Secondary | ICD-10-CM

## 2016-12-19 DIAGNOSIS — Z79899 Other long term (current) drug therapy: Secondary | ICD-10-CM | POA: Insufficient documentation

## 2016-12-19 DIAGNOSIS — R109 Unspecified abdominal pain: Secondary | ICD-10-CM | POA: Diagnosis present

## 2016-12-19 LAB — CBC
HCT: 33.6 % — ABNORMAL LOW (ref 36.0–46.0)
Hemoglobin: 11 g/dL — ABNORMAL LOW (ref 12.0–15.0)
MCH: 36.1 pg — ABNORMAL HIGH (ref 26.0–34.0)
MCHC: 32.7 g/dL (ref 30.0–36.0)
MCV: 110.2 fL — ABNORMAL HIGH (ref 78.0–100.0)
Platelets: 167 10*3/uL (ref 150–400)
RBC: 3.05 MIL/uL — ABNORMAL LOW (ref 3.87–5.11)
RDW: 14.7 % (ref 11.5–15.5)
WBC: 4.9 10*3/uL (ref 4.0–10.5)

## 2016-12-19 LAB — URINALYSIS, ROUTINE W REFLEX MICROSCOPIC
Bilirubin Urine: NEGATIVE
Glucose, UA: NEGATIVE mg/dL
Hgb urine dipstick: NEGATIVE
Ketones, ur: NEGATIVE mg/dL
Leukocytes, UA: NEGATIVE
Nitrite: NEGATIVE
Protein, ur: NEGATIVE mg/dL
Specific Gravity, Urine: 1.02 (ref 1.005–1.030)
pH: 6 (ref 5.0–8.0)

## 2016-12-19 LAB — COMPREHENSIVE METABOLIC PANEL
ALT: 21 U/L (ref 14–54)
AST: 39 U/L (ref 15–41)
Albumin: 2.1 g/dL — ABNORMAL LOW (ref 3.5–5.0)
Alkaline Phosphatase: 167 U/L — ABNORMAL HIGH (ref 38–126)
Anion gap: 4 — ABNORMAL LOW (ref 5–15)
BUN: 8 mg/dL (ref 6–20)
CO2: 23 mmol/L (ref 22–32)
Calcium: 8.5 mg/dL — ABNORMAL LOW (ref 8.9–10.3)
Chloride: 107 mmol/L (ref 101–111)
Creatinine, Ser: 0.8 mg/dL (ref 0.44–1.00)
GFR calc Af Amer: >60 mL/min (ref >60)
GFR calc non Af Amer: >60 mL/min (ref >60)
Glucose, Bld: 208 mg/dL — ABNORMAL HIGH (ref 65–99)
Potassium: 3.9 mmol/L (ref 3.5–5.1)
Sodium: 134 mmol/L — ABNORMAL LOW (ref 135–145)
Total Bilirubin: 1.7 mg/dL — ABNORMAL HIGH (ref 0.3–1.2)
Total Protein: 7.3 g/dL (ref 6.5–8.1)

## 2016-12-19 LAB — I-STAT BETA HCG BLOOD, ED (MC, WL, AP ONLY): I-stat hCG, quantitative: <5 m[IU]/mL (ref <5)

## 2016-12-19 LAB — LIPASE, BLOOD: Lipase: 29 U/L (ref 11–51)

## 2016-12-19 MED ORDER — HYDROMORPHONE HCL 1 MG/ML IJ SOLN
0.5000 mg | Freq: Once | INTRAMUSCULAR | Status: AC
  Administered 2016-12-19: 0.5 mg via INTRAVENOUS
  Filled 2016-12-19: qty 1

## 2016-12-19 MED ORDER — LIDOCAINE HCL (PF) 1 % IJ SOLN
5.0000 mL | Freq: Once | INTRAMUSCULAR | Status: AC
  Administered 2016-12-19: 5 mL via INTRADERMAL
  Filled 2016-12-19: qty 5

## 2016-12-19 MED ORDER — SODIUM CHLORIDE 0.9 % IV SOLN: INTRAVENOUS | Status: DC

## 2016-12-19 MED ORDER — ONDANSETRON HCL 4 MG/2ML IJ SOLN: 4.0000 mg | Freq: Once | INTRAMUSCULAR | Status: DC

## 2016-12-19 NOTE — ED Notes (Signed)
PA at the bedside. Paracentesis tray at the bedside.

## 2016-12-19 NOTE — ED Triage Notes (Signed)
Pt  To er from home via EMS states 2 day hx of abd pain vomited x 1 yesterday but none today, states has hx of umbilical hernia , a large one and that has been hurting also states she has been taking lactulose for her hep c and cirrhosis and that has made her feel sick, states her abd is tender to touch
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
## 2016-12-20 LAB — BODY FLUID CELL COUNT WITH DIFFERENTIAL
LYMPHS FL: 37 %
MONOCYTE-MACROPHAGE-SEROUS FLUID: 32 % — AB (ref 50–90)
NEUTROPHIL FLUID: 31 % — AB (ref 0–25)
Total Nucleated Cell Count, Fluid: 182 cu mm (ref 0–1000)

## 2016-12-20 LAB — PROTEIN, PLEURAL OR PERITONEAL FLUID: Total protein, fluid: <3 g/dL

## 2016-12-20 LAB — ALBUMIN, PLEURAL OR PERITONEAL FLUID

## 2016-12-20 LAB — LACTATE DEHYDROGENASE, PLEURAL OR PERITONEAL FLUID: LD FL: 46 U/L — AB (ref 3–23)

## 2016-12-20 LAB — GLUCOSE, PLEURAL OR PERITONEAL FLUID: Glucose, Fluid: 135 mg/dL

## 2016-12-20 MED ORDER — LACTULOSE 10 GM/15ML PO SOLN: ORAL | 1 refills | Status: DC

## 2016-12-20 NOTE — Discharge Instructions (Signed)
It is very important that you follow [facility]   Please follow with your primary care doctor in the next 2 days for a check-up. They must obtain records for further management.   Do not hesitate to return to the Emergency Department for any new, worsening or concerning symptoms.

## 2016-12-20 NOTE — ED Notes (Signed)
ED Provider at bedside. 

## 2016-12-20 NOTE — ED Provider Notes (Signed)
Medical screening examination/treatment/procedure(s) were conducted as a shared visit with non-physician practitioner(s) and myself.  I personally evaluated the patient during the encounter. I was present and assisted with paracentesis.    EKG Interpretation None      51yF with abdominal pain. Diffuse tenderness. Soft. Hernias reduce. No peritonitis on physical exam, but diagnostic paracentesis done to r/o.     Raeford RazorKohut, Aariana Shankland, MD 12/20/16 [phone]

## 2016-12-23 LAB — BODY FLUID CULTURE: CULTURE: NO GROWTH

## 2016-12-23 LAB — PATHOLOGIST SMEAR REVIEW: PATH REVIEW: REACTIVE

## 2017-01-08 ENCOUNTER — Ambulatory Visit: Payer: Medicaid Other | Admitting: Family Medicine

## 2017-01-16 ENCOUNTER — Ambulatory Visit: Payer: Medicaid Other | Admitting: Family Medicine

## 2017-01-26 ENCOUNTER — Emergency Department (HOSPITAL_COMMUNITY): Payer: Medicaid Other

## 2017-01-26 ENCOUNTER — Other Ambulatory Visit: Payer: Self-pay

## 2017-01-26 ENCOUNTER — Encounter (HOSPITAL_COMMUNITY): Payer: Self-pay | Admitting: Emergency Medicine

## 2017-01-26 ENCOUNTER — Inpatient Hospital Stay (HOSPITAL_COMMUNITY)
Admission: EM | Admit: 2017-01-26 | Discharge: 2017-01-31 | DRG: 441 | Disposition: A | Discharge status: Home / Self Care | Payer: Medicaid Other | Attending: Internal Medicine | Admitting: Internal Medicine

## 2017-01-26 DIAGNOSIS — R188 Other ascites: Secondary | ICD-10-CM | POA: Diagnosis present

## 2017-01-26 DIAGNOSIS — R109 Unspecified abdominal pain: Secondary | ICD-10-CM

## 2017-01-26 DIAGNOSIS — J9811 Atelectasis: Secondary | ICD-10-CM | POA: Diagnosis not present

## 2017-01-26 DIAGNOSIS — K8020 Calculus of gallbladder without cholecystitis without obstruction: Secondary | ICD-10-CM | POA: Diagnosis present

## 2017-01-26 DIAGNOSIS — R001 Bradycardia, unspecified: Secondary | ICD-10-CM | POA: Diagnosis not present

## 2017-01-26 DIAGNOSIS — K7460 Unspecified cirrhosis of liver: Secondary | ICD-10-CM | POA: Diagnosis present

## 2017-01-26 DIAGNOSIS — N179 Acute kidney failure, unspecified: Secondary | ICD-10-CM | POA: Diagnosis not present

## 2017-01-26 DIAGNOSIS — K767 Hepatorenal syndrome: Secondary | ICD-10-CM | POA: Diagnosis not present

## 2017-01-26 DIAGNOSIS — K429 Umbilical hernia without obstruction or gangrene: Secondary | ICD-10-CM | POA: Diagnosis present

## 2017-01-26 DIAGNOSIS — K7290 Hepatic failure, unspecified without coma: Principal | ICD-10-CM | POA: Diagnosis present

## 2017-01-26 DIAGNOSIS — D6959 Other secondary thrombocytopenia: Secondary | ICD-10-CM | POA: Diagnosis present

## 2017-01-26 DIAGNOSIS — R062 Wheezing: Secondary | ICD-10-CM

## 2017-01-26 DIAGNOSIS — R140 Abdominal distension (gaseous): Secondary | ICD-10-CM

## 2017-01-26 DIAGNOSIS — K7031 Alcoholic cirrhosis of liver with ascites: Secondary | ICD-10-CM

## 2017-01-26 DIAGNOSIS — B1710 Acute hepatitis C without hepatic coma: Secondary | ICD-10-CM | POA: Diagnosis not present

## 2017-01-26 DIAGNOSIS — G8929 Other chronic pain: Secondary | ICD-10-CM | POA: Diagnosis present

## 2017-01-26 DIAGNOSIS — I10 Essential (primary) hypertension: Secondary | ICD-10-CM | POA: Diagnosis present

## 2017-01-26 DIAGNOSIS — I9581 Postprocedural hypotension: Secondary | ICD-10-CM | POA: Diagnosis not present

## 2017-01-26 DIAGNOSIS — F17200 Nicotine dependence, unspecified, uncomplicated: Secondary | ICD-10-CM | POA: Diagnosis present

## 2017-01-26 DIAGNOSIS — K652 Spontaneous bacterial peritonitis: Secondary | ICD-10-CM | POA: Diagnosis present

## 2017-01-26 DIAGNOSIS — E875 Hyperkalemia: Secondary | ICD-10-CM | POA: Diagnosis present

## 2017-01-26 DIAGNOSIS — D539 Nutritional anemia, unspecified: Secondary | ICD-10-CM | POA: Diagnosis present

## 2017-01-26 DIAGNOSIS — B182 Chronic viral hepatitis C: Secondary | ICD-10-CM | POA: Diagnosis present

## 2017-01-26 LAB — GRAM STAIN

## 2017-01-26 LAB — CBC
HEMATOCRIT: 29.3 % — AB (ref 36.0–46.0)
Hemoglobin: 9.6 g/dL — ABNORMAL LOW (ref 12.0–15.0)
MCH: 36.1 pg — ABNORMAL HIGH (ref 26.0–34.0)
MCHC: 32.8 g/dL (ref 30.0–36.0)
MCV: 110.2 fL — ABNORMAL HIGH (ref 78.0–100.0)
Platelets: 130 10*3/uL — ABNORMAL LOW (ref 150–400)
RBC: 2.66 MIL/uL — ABNORMAL LOW (ref 3.87–5.11)
RDW: 14.1 % (ref 11.5–15.5)
WBC: 7.2 10*3/uL (ref 4.0–10.5)

## 2017-01-26 LAB — BODY FLUID CELL COUNT WITH DIFFERENTIAL
LYMPHS FL: 3 %
MONOCYTE-MACROPHAGE-SEROUS FLUID: 24 % — AB (ref 50–90)
Neutrophil Count, Fluid: 73 % — ABNORMAL HIGH (ref 0–25)
WBC FLUID: 2256 uL — AB (ref 0–1000)

## 2017-01-26 LAB — COMPREHENSIVE METABOLIC PANEL
ALBUMIN: 1.9 g/dL — AB (ref 3.5–5.0)
ALK PHOS: 145 U/L — AB (ref 38–126)
ALT: 21 U/L (ref 14–54)
AST: 38 U/L (ref 15–41)
Anion gap: 3 — ABNORMAL LOW (ref 5–15)
BILIRUBIN TOTAL: 1.7 mg/dL — AB (ref 0.3–1.2)
BUN: 17 mg/dL (ref 6–20)
CALCIUM: 8.1 mg/dL — AB (ref 8.9–10.3)
CO2: 22 mmol/L (ref 22–32)
Chloride: 108 mmol/L (ref 101–111)
Creatinine, Ser: 1 mg/dL (ref 0.44–1.00)
GFR calc Af Amer: >60 mL/min (ref >60)
GFR calc non Af Amer: >60 mL/min (ref >60)
GLUCOSE: 91 mg/dL (ref 65–99)
Potassium: 4.4 mmol/L (ref 3.5–5.1)
Sodium: 133 mmol/L — ABNORMAL LOW (ref 135–145)
TOTAL PROTEIN: 6.6 g/dL (ref 6.5–8.1)

## 2017-01-26 LAB — I-STAT BETA HCG BLOOD, ED (MC, WL, AP ONLY)

## 2017-01-26 LAB — ALBUMIN, PLEURAL OR PERITONEAL FLUID: Albumin, Fluid: <1 g/dL

## 2017-01-26 LAB — LIPASE, BLOOD: Lipase: 35 U/L (ref 11–51)

## 2017-01-26 MED ORDER — SODIUM CHLORIDE 0.9 % IV SOLN
INTRAVENOUS | Status: DC
  Administered 2017-01-27: 12:00:00 via INTRAVENOUS

## 2017-01-26 MED ORDER — ONDANSETRON HCL 4 MG PO TABS: 4.0000 mg | ORAL_TABLET | Freq: Four times a day (QID) | ORAL | Status: DC | PRN

## 2017-01-26 MED ORDER — SODIUM CHLORIDE 0.9 % IV SOLN: INTRAVENOUS | Status: DC

## 2017-01-26 MED ORDER — LORAZEPAM 2 MG/ML IJ SOLN
1.0000 mg | Freq: Once | INTRAMUSCULAR | Status: AC
  Administered 2017-01-26: 1 mg via INTRAVENOUS
  Filled 2017-01-26: qty 1

## 2017-01-26 MED ORDER — SODIUM CHLORIDE 0.9% FLUSH: 3.0000 mL | INTRAVENOUS | Status: DC | PRN

## 2017-01-26 MED ORDER — SPIRONOLACTONE 50 MG PO TABS: 50.0000 mg | ORAL_TABLET | Freq: Every day | ORAL | Status: DC

## 2017-01-26 MED ORDER — ONDANSETRON HCL 4 MG/2ML IJ SOLN
4.0000 mg | Freq: Once | INTRAMUSCULAR | Status: AC
  Administered 2017-01-26: 4 mg via INTRAVENOUS
  Filled 2017-01-26: qty 2

## 2017-01-26 MED ORDER — SODIUM CHLORIDE 0.9% FLUSH
3.0000 mL | Freq: Two times a day (BID) | INTRAVENOUS | Status: DC
  Administered 2017-01-27 – 2017-01-31 (×6): 3 mL via INTRAVENOUS

## 2017-01-26 MED ORDER — SPIRONOLACTONE 25 MG PO TABS: 50.0000 mg | ORAL_TABLET | Freq: Every day | ORAL | Status: DC

## 2017-01-26 MED ORDER — MORPHINE SULFATE (PF) 4 MG/ML IV SOLN
4.0000 mg | Freq: Once | INTRAVENOUS | Status: AC
  Administered 2017-01-26: 4 mg via INTRAVENOUS
  Filled 2017-01-26: qty 1

## 2017-01-26 MED ORDER — TRAZODONE HCL 50 MG PO TABS: 50.0000 mg | ORAL_TABLET | Freq: Every evening | ORAL | Status: DC | PRN

## 2017-01-26 MED ORDER — ALBUMIN HUMAN 25 % IV SOLN
12.5000 g | Freq: Once | INTRAVENOUS | Status: AC
  Administered 2017-01-26: 12.5 g via INTRAVENOUS
  Filled 2017-01-26: qty 50

## 2017-01-26 MED ORDER — ALBUMIN HUMAN 25 % IV SOLN
12.5000 g | Freq: Once | INTRAVENOUS | Status: AC
  Administered 2017-01-26: 12.5 g via INTRAVENOUS
  Filled 2017-01-26 (×2): qty 50

## 2017-01-26 MED ORDER — SODIUM CHLORIDE 0.9 % IV BOLUS (SEPSIS): 2000.0000 mL | Freq: Once | INTRAVENOUS | Status: DC

## 2017-01-26 MED ORDER — RIBAVIRIN 200 MG PO CAPS
600.0000 mg | ORAL_CAPSULE | Freq: Every day | ORAL | Status: DC
  Filled 2017-01-26: qty 3

## 2017-01-26 MED ORDER — CARVEDILOL 6.25 MG PO TABS: 6.2500 mg | ORAL_TABLET | Freq: Two times a day (BID) | ORAL | Status: DC

## 2017-01-26 MED ORDER — VITAMIN B-1 100 MG PO TABS
100.0000 mg | ORAL_TABLET | Freq: Every day | ORAL | Status: DC
  Administered 2017-01-26 – 2017-01-31 (×6): 100 mg via ORAL
  Filled 2017-01-26 (×6): qty 1

## 2017-01-26 MED ORDER — MORPHINE SULFATE (PF) 2 MG/ML IV SOLN
INTRAVENOUS | Status: AC
  Filled 2017-01-26: qty 1

## 2017-01-26 MED ORDER — RIBAVIRIN 200 MG PO CAPS: 400.0000 mg | ORAL_CAPSULE | Freq: Every day | ORAL | Status: DC

## 2017-01-26 MED ORDER — ACETAMINOPHEN 650 MG RE SUPP: 650.0000 mg | Freq: Four times a day (QID) | RECTAL | Status: DC | PRN

## 2017-01-26 MED ORDER — DEXTROSE 5 % IV SOLN
2.0000 g | Freq: Once | INTRAVENOUS | Status: AC
  Administered 2017-01-26: 2 g via INTRAVENOUS
  Filled 2017-01-26: qty 2

## 2017-01-26 MED ORDER — SOFOSBUVIR-VELPATASVIR 400-100 MG PO TABS: 1.0000 | ORAL_TABLET | Freq: Every day | ORAL | Status: DC

## 2017-01-26 MED ORDER — CARVEDILOL 3.125 MG PO TABS
3.1250 mg | ORAL_TABLET | Freq: Two times a day (BID) | ORAL | Status: DC
  Administered 2017-01-26 – 2017-01-27 (×2): 3.125 mg via ORAL
  Filled 2017-01-26 (×3): qty 1

## 2017-01-26 MED ORDER — ALBUTEROL SULFATE (2.5 MG/3ML) 0.083% IN NEBU
2.5000 mg | INHALATION_SOLUTION | RESPIRATORY_TRACT | Status: DC | PRN
  Administered 2017-01-27: 2.5 mg via RESPIRATORY_TRACT
  Filled 2017-01-26: qty 3

## 2017-01-26 MED ORDER — HEPARIN SODIUM (PORCINE) 5000 UNIT/ML IJ SOLN: 5000.0000 [IU] | Freq: Three times a day (TID) | INTRAMUSCULAR | Status: DC

## 2017-01-26 MED ORDER — ACETAMINOPHEN 325 MG PO TABS: 650.0000 mg | ORAL_TABLET | Freq: Four times a day (QID) | ORAL | Status: DC | PRN

## 2017-01-26 MED ORDER — DULOXETINE HCL 20 MG PO CPEP
20.0000 mg | ORAL_CAPSULE | Freq: Every day | ORAL | Status: DC | PRN
  Filled 2017-01-26 (×2): qty 1

## 2017-01-26 MED ORDER — SODIUM CHLORIDE 0.9 % IV SOLN
INTRAVENOUS | Status: DC
  Administered 2017-01-26: 08:00:00 via INTRAVENOUS

## 2017-01-26 MED ORDER — SODIUM CHLORIDE 0.9 % IV SOLN: 250.0000 mL | INTRAVENOUS | Status: DC | PRN

## 2017-01-26 MED ORDER — ONDANSETRON HCL 4 MG/2ML IJ SOLN: 4.0000 mg | Freq: Four times a day (QID) | INTRAMUSCULAR | Status: DC | PRN

## 2017-01-26 MED ORDER — LIDOCAINE HCL 1 % IJ SOLN
INTRAMUSCULAR | Status: AC
  Filled 2017-01-26: qty 10

## 2017-01-26 MED ORDER — FOLIC ACID 1 MG PO TABS
1.0000 mg | ORAL_TABLET | Freq: Every day | ORAL | Status: DC
  Administered 2017-01-26 – 2017-01-31 (×6): 1 mg via ORAL
  Filled 2017-01-26 (×6): qty 1

## 2017-01-26 MED ORDER — MORPHINE SULFATE (PF) 2 MG/ML IV SOLN
2.0000 mg | INTRAVENOUS | Status: DC | PRN
  Administered 2017-01-26: 2 mg via INTRAVENOUS

## 2017-01-26 MED ORDER — LACTULOSE 10 GM/15ML PO SOLN
30.0000 g | Freq: Three times a day (TID) | ORAL | Status: DC
  Administered 2017-01-26 – 2017-01-31 (×12): 30 g via ORAL
  Filled 2017-01-26 (×14): qty 45

## 2017-01-26 NOTE — ED Notes (Signed)
Patient transported to CT 

## 2017-01-26 NOTE — ED Notes (Signed)
Pt complains of pain, however pt then falls asleep by the end of the conversation.
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
## 2017-01-26 NOTE — ED Notes (Signed)
Hospitalist at bedside 

## 2017-01-26 NOTE — ED Notes (Signed)
Bed: [id number] Expected date:  Expected time:  Means of arrival:  Comments: 52 yo Abdominal pain, hx hernia
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
## 2017-01-26 NOTE — ED Notes (Signed)
Pt is out of room. Could not get urine.

## 2017-01-26 NOTE — ED Notes (Signed)
Informed pt that needed urine.

## 2017-01-26 NOTE — Procedures (Signed)
Ultrasound-guided diagnostic and therapeutic paracentesis performed yielding 2.2 liters of slightly hazy, yellow fluid. No immediate complications.  A portion of the fluid was submitted to the lab for preordered studies.

## 2017-01-26 NOTE — ED Notes (Signed)
Report given on floor to [place]. Pt will be transported shortly

## 2017-01-26 NOTE — ED Triage Notes (Signed)
Pt reports she has had generalized abd pain, swelling, and increased umbilical hernia swelling for the past week. Hx of cirrhosis and has a hx of paracentesis. Pt also reports she has had intermittent n/v/d and chills for the past week.

## 2017-01-27 ENCOUNTER — Inpatient Hospital Stay (HOSPITAL_COMMUNITY): Payer: Medicaid Other

## 2017-01-27 DIAGNOSIS — D539 Nutritional anemia, unspecified: Secondary | ICD-10-CM

## 2017-01-27 DIAGNOSIS — N179 Acute kidney failure, unspecified: Secondary | ICD-10-CM

## 2017-01-27 DIAGNOSIS — E875 Hyperkalemia: Secondary | ICD-10-CM

## 2017-01-27 LAB — COMPREHENSIVE METABOLIC PANEL
ALT: 20 U/L (ref 14–54)
ANION GAP: 3 — AB (ref 5–15)
AST: 38 U/L (ref 15–41)
Albumin: 2.5 g/dL — ABNORMAL LOW (ref 3.5–5.0)
Alkaline Phosphatase: 102 U/L (ref 38–126)
BUN: 30 mg/dL — ABNORMAL HIGH (ref 6–20)
CHLORIDE: 107 mmol/L (ref 101–111)
CO2: 22 mmol/L (ref 22–32)
CREATININE: 2.72 mg/dL — AB (ref 0.44–1.00)
Calcium: 8.3 mg/dL — ABNORMAL LOW (ref 8.9–10.3)
GFR, EST AFRICAN AMERICAN: 22 mL/min — AB (ref >60)
GFR, EST NON AFRICAN AMERICAN: 19 mL/min — AB (ref >60)
Glucose, Bld: 94 mg/dL (ref 65–99)
Potassium: 5.3 mmol/L — ABNORMAL HIGH (ref 3.5–5.1)
SODIUM: 132 mmol/L — AB (ref 135–145)
Total Bilirubin: 1.7 mg/dL — ABNORMAL HIGH (ref 0.3–1.2)
Total Protein: 7.3 g/dL (ref 6.5–8.1)

## 2017-01-27 LAB — CBC
HCT: 29.1 % — ABNORMAL LOW (ref 36.0–46.0)
Hemoglobin: 9.4 g/dL — ABNORMAL LOW (ref 12.0–15.0)
MCH: 35.9 pg — AB (ref 26.0–34.0)
MCHC: 32.3 g/dL (ref 30.0–36.0)
MCV: 111.1 fL — AB (ref 78.0–100.0)
Platelets: 131 10*3/uL — ABNORMAL LOW (ref 150–400)
RBC: 2.62 MIL/uL — AB (ref 3.87–5.11)
RDW: 14 % (ref 11.5–15.5)
WBC: 5.9 10*3/uL (ref 4.0–10.5)

## 2017-01-27 LAB — BASIC METABOLIC PANEL
ANION GAP: 6 (ref 5–15)
BUN: 34 mg/dL — ABNORMAL HIGH (ref 6–20)
CHLORIDE: 107 mmol/L (ref 101–111)
CO2: 19 mmol/L — ABNORMAL LOW (ref 22–32)
CREATININE: 2.66 mg/dL — AB (ref 0.44–1.00)
Calcium: 8.4 mg/dL — ABNORMAL LOW (ref 8.9–10.3)
GFR calc Af Amer: 22 mL/min — ABNORMAL LOW (ref >60)
GFR calc non Af Amer: 19 mL/min — ABNORMAL LOW (ref >60)
GLUCOSE: 175 mg/dL — AB (ref 65–99)
Potassium: 5.4 mmol/L — ABNORMAL HIGH (ref 3.5–5.1)
Sodium: 132 mmol/L — ABNORMAL LOW (ref 135–145)

## 2017-01-27 LAB — BLOOD GAS, VENOUS
Acid-base deficit: 6.2 mmol/L — ABNORMAL HIGH (ref 0.0–2.0)
BICARBONATE: 19.4 mmol/L — AB (ref 20.0–28.0)
O2 Saturation: 88.5 %
PCO2 VEN: 41.3 mmHg — AB (ref 44.0–60.0)
Patient temperature: 37
pH, Ven: 7.293 (ref 7.250–7.430)
pO2, Ven: 60.4 mmHg — ABNORMAL HIGH (ref 32.0–45.0)

## 2017-01-27 MED ORDER — ALBUMIN HUMAN 25 % IV SOLN
75.0000 g | Freq: Once | INTRAVENOUS | Status: DC
  Filled 2017-01-27: qty 300

## 2017-01-27 MED ORDER — SODIUM CHLORIDE 0.9 % IV SOLN
50.0000 ug/h | INTRAVENOUS | Status: DC
  Administered 2017-01-27 – 2017-01-30 (×7): 50 ug/h via INTRAVENOUS
  Filled 2017-01-27 (×13): qty 1

## 2017-01-27 MED ORDER — MORPHINE SULFATE (PF) 4 MG/ML IV SOLN
2.0000 mg | INTRAVENOUS | Status: DC | PRN
  Administered 2017-01-27 (×4): 2 mg via INTRAVENOUS
  Filled 2017-01-27 (×4): qty 1

## 2017-01-27 MED ORDER — RIFAXIMIN 550 MG PO TABS
550.0000 mg | ORAL_TABLET | Freq: Two times a day (BID) | ORAL | Status: DC
  Administered 2017-01-27 – 2017-01-31 (×9): 550 mg via ORAL
  Filled 2017-01-27 (×9): qty 1

## 2017-01-27 MED ORDER — MIDODRINE HCL 5 MG PO TABS
7.5000 mg | ORAL_TABLET | Freq: Three times a day (TID) | ORAL | Status: DC
  Administered 2017-01-27 – 2017-01-31 (×13): 7.5 mg via ORAL
  Filled 2017-01-27 (×13): qty 1

## 2017-01-27 MED ORDER — DEXTROSE 5 % IV SOLN
2.0000 g | INTRAVENOUS | Status: DC
  Administered 2017-01-27 – 2017-01-29 (×3): 2 g via INTRAVENOUS
  Filled 2017-01-27 (×4): qty 2

## 2017-01-27 MED ORDER — OXYCODONE HCL 5 MG PO TABS
5.0000 mg | ORAL_TABLET | Freq: Once | ORAL | Status: AC
  Administered 2017-01-27: 5 mg via ORAL
  Filled 2017-01-27: qty 1

## 2017-01-27 MED ORDER — ALBUMIN HUMAN 25 % IV SOLN
100.0000 g | Freq: Once | INTRAVENOUS | Status: AC
  Administered 2017-01-28: 100 g via INTRAVENOUS
  Filled 2017-01-27: qty 400

## 2017-01-27 MED ORDER — IPRATROPIUM-ALBUTEROL 0.5-2.5 (3) MG/3ML IN SOLN
3.0000 mL | Freq: Four times a day (QID) | RESPIRATORY_TRACT | Status: DC
  Administered 2017-01-27 – 2017-01-28 (×3): 3 mL via RESPIRATORY_TRACT
  Filled 2017-01-27 (×4): qty 3

## 2017-01-27 MED ORDER — MORPHINE SULFATE (PF) 4 MG/ML IV SOLN
2.0000 mg | INTRAVENOUS | Status: DC | PRN
  Administered 2017-01-27 – 2017-01-28 (×4): 2 mg via INTRAVENOUS
  Filled 2017-01-27 (×4): qty 1

## 2017-01-27 NOTE — Progress Notes (Signed)
PROGRESS NOTE    Linda Vaughn  FAO:[id number] DOB: 03/11/1965 DOA: 01/26/2017 PCP: [name], FNP   Brief Narrative:  52 y.o. female with past medical history relevant for liver cirrhosis secondary to hep C presents with abdominal pain, increased abdominal distention and chills for several days.   Assessment & Plan:   Principal Problem:   SBP (spontaneous bacterial peritonitis) (HCC) Active Problems:   Cholelithiasis   HTN (hypertension)   Tobacco dependence   Umbilical hernia without obstruction and without gangrene   Ascites   Hepatic cirrhosis (HCC)   Chronic hepatitis C without hepatic coma (HCC)   1)Spontaneous Bacterial Peritonitis- wbc of ascitic fluid 2,256, with 73 % Neutrophils., Continue ceftriaxone 2 g daily Follow cultures and cytology Follow blood cultures GI c/s, appreciate recs  2) Acute Kidney Injury: with above, suspect this is HRS.  No contrast with CT yesterday,   She got about about 0.5 g/kg albumin yesterday.  Will give ~1 g/kg albumin today (addendum: changed to 100 g on 1/23 per GI).  Start on midorine and octreotide.   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  Lactulose, rifaximin added by GI  3)Ventral Abdominal Hernia- will c/s surgery as does not seem reducible  # Wheezing: satting well on room air, will schedule duonebs, follow CXR, VBG, continue prn albuterol as needed  4)Hemodynamics-post paracentesis patient was found to have borderline hypotension, albumin as noted above.  Hold coreg.  5) chronic anemia and chronic thrombocytopenia-due to underlying liver disease, no evidence of acute ongoing bleeding at this time, monitor closely, transfuse as clinically indicated, hold off on heparin products at this time  6) Concern for vaginal bleeding: resolved, will check UA.   No blood in stool.  Concern for a few drops of blood this morning.  Continue to monitor for now, will need outpatient follow up.    DVT prophylaxis: SCD Code Status: full  Family Communication: none at bedside Disposition Plan: pending improvement   Consultants:   Renal  Gastroenterology  Procedures:   paracentesis  Antimicrobials: Anti-infectives (From admission, onward)   Start     Dose/Rate Route Frequency Ordered Stop   01/27/17 1700  ribavirin (REBETOL) capsule 400 mg  Status:  Discontinued     400 mg Oral Daily before supper 01/26/17 2144 01/27/17 0907   01/27/17 1500  cefTRIAXone (ROCEPHIN) 2 g in dextrose 5 % 50 mL IVPB     2 g 100 mL/hr over 30 Minutes Intravenous Every 24 hours 01/27/17 0908     01/27/17 1000  Sofosbuvir-Velpatasvir 400-100 MG TABS 1 tablet     1 tablet Oral Daily 01/26/17 1658     01/27/17 1000  ribavirin (REBETOL) capsule 600 mg  Status:  Discontinued    Comments:  600 mg in the morning and 400 mg in the late afternoon     600 mg Oral Daily 01/26/17 1658 01/27/17 0907   01/26/17 1500  cefTRIAXone (ROCEPHIN) 2 g in dextrose 5 % 50 mL IVPB     2 g 100 mL/hr over 30 Minutes Intravenous  Once 01/26/17 1453 01/26/17 1638      Subjective: C/o abdominal pain. Presented due to abdominal pain and concern about hernia.   Objective: Vitals:   01/26/17 2015 01/26/17 2042 01/27/17 0620 01/27/17 0928  BP: (!) 88/60 105/65 100/60 125/62  Pulse:  Marland Kitchen)  47 65 66  Resp:  16 18   Temp:  (!) 96 F (35.6 C) 97.9 F (36.6 C)   TempSrc:  Oral Oral   SpO2:  100% 99%   Weight:      Height:        Intake/Output Summary (Last 24 hours) at 01/27/2017 1000 Last data filed at 01/26/2017 1818 Gross per 24 hour  Intake 300 ml  Output -  Net 300 ml   Filed Weights   01/26/17 0736  Weight: 81.6 kg (180 lb)    Examination:  General exam: Appears calm and comfortable  Respiratory system: Clear to auscultation. Wheezing on exam Cardiovascular system: S1 &  S2 heard, RRR. No JVD, murmurs, rubs, gallops or clicks. No pedal edema.  Positive JVD.  Gastrointestinal system: Abdomen is protuberant.  Periumbilical hernia.  Tense  Ascites.  Tender to palpation. Central nervous system: Alert and oriented. No focal neurological deficits. Extremities: Compression stockings on, trace edema through stockign.  Skin: No rashes, lesions or ulcers Psychiatry: Judgement and insight appear normal. Mood & affect appropriate.   Data Reviewed: I have personally reviewed following labs and imaging studies  CBC: Recent Labs  Lab 01/26/17 0752 01/27/17 0616  WBC 7.2 5.9  HGB 9.6* 9.4*  HCT 29.3* 29.1*  MCV 110.2* 111.1*  PLT 130* 131*   Basic Metabolic Panel: Recent Labs  Lab 01/26/17 0752 01/27/17 0616  NA 133* 132*  K 4.4 5.3*  CL 108 107  CO2 22 22  GLUCOSE 91 94  BUN 17 30*  CREATININE 1.00 2.72*  CALCIUM 8.1* 8.3*   GFR: Estimated Creatinine Clearance: 22.6 mL/min (A) (by C-G formula based on SCr of 2.72 mg/dL (H)). Liver Function Tests: Recent Labs  Lab 01/26/17 0752 01/27/17 0616  AST 38 38  ALT 21 20  ALKPHOS 145* 102  BILITOT 1.7* 1.7*  PROT 6.6 7.3  ALBUMIN 1.9* 2.5*   Recent Labs  Lab 01/26/17 0752  LIPASE 35   No results for input(s): AMMONIA in the last 168 hours. Coagulation Profile: No results for input(s): INR, PROTIME in the last 168 hours. Cardiac Enzymes: No results for input(s): CKTOTAL, CKMB, CKMBINDEX, TROPONINI in the last 168 hours. BNP (last 3 results) No results for input(s): PROBNP in the last 8760 hours. HbA1C: No results for input(s): HGBA1C in the last 72 hours. CBG: No results for input(s): GLUCAP in the last 168 hours. Lipid Profile: No results for input(s): CHOL, HDL, LDLCALC, TRIG, CHOLHDL, LDLDIRECT in the last 72 hours. Thyroid Function Tests: No results for input(s): TSH, T4TOTAL, FREET4, T3FREE, THYROIDAB in the last 72 hours. Anemia Panel: No results for input(s): VITAMINB12, FOLATE,  FERRITIN, TIBC, IRON, RETICCTPCT in the last 72 hours. Sepsis Labs: No results for input(s): PROCALCITON, LATICACIDVEN in the last 168 hours.  Recent Results (from the past 240 hour(s))  Gram stain     Status: None   Collection Time: 01/26/17 12:01 PM  Result Value Ref Range Status   Specimen Description ASCITIC  Final   Special Requests NONE  Final   Gram Stain   Final    MODERATE WBC PRESENT, PREDOMINANTLY MONONUCLEAR NO ORGANISMS SEEN Performed at [facility], [address] [place], [place] [address]    Report Status 01/26/2017 FINAL  Final         Radiology Studies: Ct Abdomen Pelvis Wo Contrast  Result Date: 01/26/2017 CLINICAL DATA:  Abdominal pain and swelling with increasing size of known umbilical hernia EXAM: CT ABDOMEN AND  PELVIS WITHOUT CONTRAST TECHNIQUE: Multidetector CT imaging of the abdomen and pelvis was performed following the standard protocol without IV contrast. COMPARISON:  11/24/2016 FINDINGS: Lower chest: Stable right-sided pleural effusion is again noted. Hepatobiliary: Cirrhotic change of the liver is seen with significant nodularity. The gallbladder has been surgically removed. No focal hepatic mass is noted. Pancreas: Unremarkable. No pancreatic ductal dilatation or surrounding inflammatory changes. Spleen: Normal in size without focal abnormality. Adrenals/Urinary Tract: Adrenal glands are within normal limits. Kidneys are well visualized bilaterally. No renal calculi or obstructive changes are seen. The bladder is partially distended. Stomach/Bowel: Scattered diverticular changes noted without evidence of diverticulitis. No obstructive changes are seen. The appendix is not well appreciated. Vascular/Lymphatic: Aortic atherosclerosis. No enlarged abdominal or pelvic lymph nodes. Reproductive: Uterus and bilateral adnexa are unremarkable. Other: Moderate ascites is noted. Anterior paraumbilical hernia is seen complex in nature. This is increased in size  in the transverse dimension to 11.1 cm. The more inferior component has increased slightly now measuring 7.1 cm in transverse dimension. Mild changes of anasarca are again identified. Musculoskeletal: Degenerative changes of the spine are noted. No acute bony abnormality is seen. IMPRESSION: Hepatic cirrhosis with associated changes of anasarca and ascites. The amount of ascites has increased in the interval from the prior exam. There is also been increase in the size of a complex paraumbilical hernia Stable moderate-sized right pleural effusion. No other acute abnormality is noted. Electronically Signed   By: Alcide Clever M.D.   On: 01/26/2017 09:28   US Paracentesis  Result Date: 01/26/2017 INDICATION: Cirrhosis, hepatitis-C, ascites. Request made for diagnostic and therapeutic paracentesis. EXAM: ULTRASOUND GUIDED DIAGNOSTIC AND THERAPEUTIC PARACENTESIS MEDICATIONS: None. COMPLICATIONS: None immediate. PROCEDURE: Informed written consent was obtained from the patient after a discussion of the risks, benefits and alternatives to treatment. A timeout was performed prior to the initiation of the procedure. Initial ultrasound scanning demonstrates a small to moderate amount of ascites within the right upper to mid abdominal quadrant. The right upper to mid abdomen was prepped and draped in the usual sterile fashion. 1% lidocaine was used for local anesthesia. Following this, a Yueh catheter was introduced. An ultrasound image was saved for documentation purposes. The paracentesis was performed. The catheter was removed and a dressing was applied. The patient tolerated the procedure well without immediate post procedural complication. FINDINGS: A total of approximately 2.2 liters of slightly hazy, yellow fluid was removed. Samples were sent to the laboratory as requested by the clinical team. IMPRESSION: Successful ultrasound-guided diagnostic and therapeutic paracentesis yielding 2.2 liters of peritoneal fluid.  Read by: Jeananne Rama, PA-C Electronically Signed   By: Malachy Moan M.D.   On: 01/26/2017 12:27        Scheduled Meds: . carvedilol  3.125 mg Oral BID  . folic acid  1 mg Oral Daily  . lactulose  30 g Oral TID  . midodrine  7.5 mg Oral TID WC  . sodium chloride flush  3 mL Intravenous Q12H  . Sofosbuvir-Velpatasvir  1 tablet Oral Daily  . thiamine  100 mg Oral Daily   Continuous Infusions: . sodium chloride    . sodium chloride 50 mL/hr at 01/26/17 2154  . albumin human    . cefTRIAXone (ROCEPHIN)  IV    . octreotide  (SANDOSTATIN)    IV infusion       LOS: 1 day    Time spent: over 30 min    Lacretia Nicks, MD [facility] Pager [phone]   If  7PM-7AM, please contact night-coverage [email or web address] Password [email or web address] 01/27/2017, 10:00 AM
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
## 2017-01-27 NOTE — Care Management Note (Signed)
Case Management Note  Patient Details  Name: Otho DarnerJarryl D Rosol MRN: [id number] Date of Birth: 1965-01-19  Subjective/Objective:  52 y/o f admitted w/SBP. From home.                  Action/Plan:d/c plan home.   Expected Discharge Date:  (unknown)               Expected Discharge Plan:  Home/Self Care  In-House Referral:     Discharge planning Services  CM Consult  Post Acute Care Choice:    Choice offered to:     DME Arranged:    DME Agency:     HH Arranged:    HH Agency:     Status of Service:  In process, will continue to follow  If discussed at Long Length of Stay Meetings, dates discussed:    Additional Comments:  Lanier ClamMahabir, Keymani Mclean, RN 01/27/2017, 11:30 AM

## 2017-01-28 ENCOUNTER — Other Ambulatory Visit: Payer: Self-pay

## 2017-01-28 DIAGNOSIS — B1710 Acute hepatitis C without hepatic coma: Secondary | ICD-10-CM

## 2017-01-28 DIAGNOSIS — R188 Other ascites: Secondary | ICD-10-CM

## 2017-01-28 LAB — PROTIME-INR
INR: 1.67
Prothrombin Time: 19.5 seconds — ABNORMAL HIGH (ref 11.4–15.2)

## 2017-01-28 LAB — CBC
HCT: 30.6 % — ABNORMAL LOW (ref 36.0–46.0)
HEMOGLOBIN: 9.9 g/dL — AB (ref 12.0–15.0)
MCH: 35.9 pg — ABNORMAL HIGH (ref 26.0–34.0)
MCHC: 32.4 g/dL (ref 30.0–36.0)
MCV: 110.9 fL — ABNORMAL HIGH (ref 78.0–100.0)
PLATELETS: 150 10*3/uL (ref 150–400)
RBC: 2.76 MIL/uL — ABNORMAL LOW (ref 3.87–5.11)
RDW: 13.9 % (ref 11.5–15.5)
WBC: 7.1 10*3/uL (ref 4.0–10.5)

## 2017-01-28 LAB — COMPREHENSIVE METABOLIC PANEL
ALBUMIN: 2.6 g/dL — AB (ref 3.5–5.0)
ALT: 24 U/L (ref 14–54)
ANION GAP: 5 (ref 5–15)
AST: 49 U/L — ABNORMAL HIGH (ref 15–41)
Alkaline Phosphatase: 101 U/L (ref 38–126)
BILIRUBIN TOTAL: 1.6 mg/dL — AB (ref 0.3–1.2)
BUN: 39 mg/dL — ABNORMAL HIGH (ref 6–20)
CO2: 20 mmol/L — ABNORMAL LOW (ref 22–32)
Calcium: 8.4 mg/dL — ABNORMAL LOW (ref 8.9–10.3)
Chloride: 107 mmol/L (ref 101–111)
Creatinine, Ser: 2.68 mg/dL — ABNORMAL HIGH (ref 0.44–1.00)
GFR calc non Af Amer: 19 mL/min — ABNORMAL LOW (ref >60)
GFR, EST AFRICAN AMERICAN: 22 mL/min — AB (ref >60)
GLUCOSE: 100 mg/dL — AB (ref 65–99)
Potassium: 6.1 mmol/L — ABNORMAL HIGH (ref 3.5–5.1)
Sodium: 132 mmol/L — ABNORMAL LOW (ref 135–145)
Total Protein: 7.8 g/dL (ref 6.5–8.1)

## 2017-01-28 LAB — POTASSIUM
POTASSIUM: 5.6 mmol/L — AB (ref 3.5–5.1)
Potassium: 4.8 mmol/L (ref 3.5–5.1)

## 2017-01-28 LAB — GLUCOSE, RANDOM: Glucose, Bld: 94 mg/dL (ref 65–99)

## 2017-01-28 LAB — GLUCOSE, CAPILLARY
GLUCOSE-CAPILLARY: 117 mg/dL — AB (ref 65–99)
Glucose-Capillary: 136 mg/dL — ABNORMAL HIGH (ref 65–99)

## 2017-01-28 MED ORDER — DEXTROSE 50 % IV SOLN
1.0000 | Freq: Once | INTRAVENOUS | Status: AC
  Administered 2017-01-28: 50 mL via INTRAVENOUS
  Filled 2017-01-28: qty 50

## 2017-01-28 MED ORDER — SODIUM POLYSTYRENE SULFONATE 15 GM/60ML PO SUSP: 30.0000 g | Freq: Once | ORAL | Status: DC

## 2017-01-28 MED ORDER — ALBUMIN HUMAN 5 % IV SOLN
50.0000 g | Freq: Two times a day (BID) | INTRAVENOUS | Status: AC
  Administered 2017-01-29 (×2): 50 g via INTRAVENOUS
  Filled 2017-01-28 (×2): qty 1000

## 2017-01-28 MED ORDER — MORPHINE SULFATE (PF) 4 MG/ML IV SOLN
1.0000 mg | Freq: Four times a day (QID) | INTRAVENOUS | Status: DC | PRN
  Administered 2017-01-28 – 2017-01-29 (×2): 1 mg via INTRAVENOUS
  Filled 2017-01-28 (×2): qty 1

## 2017-01-28 MED ORDER — OXYCODONE HCL 5 MG PO TABS
5.0000 mg | ORAL_TABLET | ORAL | Status: DC | PRN
  Administered 2017-01-28 – 2017-01-31 (×11): 5 mg via ORAL
  Filled 2017-01-28 (×12): qty 1

## 2017-01-28 MED ORDER — INSULIN ASPART 100 UNIT/ML IV SOLN
10.0000 [IU] | Freq: Once | INTRAVENOUS | Status: AC
  Administered 2017-01-28: 10 [IU] via INTRAVENOUS

## 2017-01-28 MED ORDER — IPRATROPIUM-ALBUTEROL 0.5-2.5 (3) MG/3ML IN SOLN
3.0000 mL | Freq: Two times a day (BID) | RESPIRATORY_TRACT | Status: DC
  Administered 2017-01-28 – 2017-01-29 (×3): 3 mL via RESPIRATORY_TRACT
  Filled 2017-01-28 (×3): qty 3

## 2017-01-28 MED ORDER — MORPHINE SULFATE (PF) 4 MG/ML IV SOLN: 1.0000 mg | Freq: Four times a day (QID) | INTRAVENOUS | Status: DC | PRN

## 2017-01-28 MED ORDER — SODIUM POLYSTYRENE SULFONATE PO POWD
30.0000 g | Freq: Once | ORAL | Status: AC
  Administered 2017-01-28: 30 g via ORAL
  Filled 2017-01-28: qty 30

## 2017-01-28 NOTE — Progress Notes (Signed)
Patient ID: Linda Vaughn, female   DOB: [birth year], 52 y.o.   MRN: [id number]     Subjective: Complaining of abdominal pain and pain at her hernia site  Objective: Vital signs in last 24 hours: Temp:  [97.7 F (36.5 C)-98 F (36.7 C)] 98 F (36.7 C) (01/23 0534) Pulse Rate:  [64-67] 64 (01/23 0534) Resp:  [10-20] 20 (01/23 0534) BP: (105-120)/(60-74) 111/60 (01/23 0534) SpO2:  [98 %-100 %] 99 % (01/23 0534) Last BM Date: 01/26/17  Intake/Output from previous day: 01/22 0701 - 01/23 0700 In: 1700.8 [P.O.:240; I.V.:1410.8; IV Piggyback:50] Out: 400  Intake/Output this shift: No intake/output data recorded.  General appearance: alert, cooperative and mild distress GI: Mild distention consistent with ascites.  Hernia at umbilicus which is somewhat tender, partially reducible with continued pressure and soft.  Lab Results:  Recent Labs    01/27/17 0616 01/28/17 0514  WBC 5.9 7.1  HGB 9.4* 9.9*  HCT 29.1* 30.6*  PLT 131* 150   BMET Recent Labs    01/27/17 1355 01/28/17 0514  NA 132* 132*  K 5.4* 6.1*  CL 107 107  CO2 19* 20*  GLUCOSE 175* 100*  BUN 34* 39*  CREATININE 2.66* 2.68*  CALCIUM 8.4* 8.4*     Studies/Results: US Paracentesis  Result Date: 01/26/2017 INDICATION: Cirrhosis, hepatitis-C, ascites. Request made for diagnostic and therapeutic paracentesis. EXAM: ULTRASOUND GUIDED DIAGNOSTIC AND THERAPEUTIC PARACENTESIS MEDICATIONS: None. COMPLICATIONS: None immediate. PROCEDURE: Informed written consent was obtained from the patient after a discussion of the risks, benefits and alternatives to treatment. A timeout was performed prior to the initiation of the procedure. Initial ultrasound scanning demonstrates a small to moderate amount of ascites within the right upper to mid abdominal quadrant. The right upper to mid abdomen was prepped and draped in the usual sterile fashion. 1% lidocaine was used for local anesthesia. Following this, a Yueh catheter was  introduced. An ultrasound image was saved for documentation purposes. The paracentesis was performed. The catheter was removed and a dressing was applied. The patient tolerated the procedure well without immediate post procedural complication. FINDINGS: A total of approximately 2.2 liters of slightly hazy, yellow fluid was removed. Samples were sent to the laboratory as requested by the clinical team. IMPRESSION: Successful ultrasound-guided diagnostic and therapeutic paracentesis yielding 2.2 liters of peritoneal fluid. Read by: Jeananne Rama, PA-C Electronically Signed   By: Malachy Moan M.D.   On: 01/26/2017 12:27   Dg Chest Port 1 View  Result Date: 01/27/2017 CLINICAL DATA:  Severe shortness of breath, cough, congestion EXAM: PORTABLE CHEST 1 VIEW COMPARISON:  05/25/2015 FINDINGS: Low lung volumes. Heart size is borderline, accentuated by the AP portable nature of the study and the low volumes. Mild vascular congestion and bibasilar opacities, likely atelectasis. IMPRESSION: Low lung volumes. Mild vascular congestion and bibasilar atelectasis. Electronically Signed   By: Charlett Nose M.D.   On: 01/27/2017 11:38    Anti-infectives: Anti-infectives (From admission, onward)   Start     Dose/Rate Route Frequency Ordered Stop   01/27/17 1700  ribavirin (REBETOL) capsule 400 mg  Status:  Discontinued     400 mg Oral Daily before supper 01/26/17 2144 01/27/17 0907   01/27/17 1500  cefTRIAXone (ROCEPHIN) 2 g in dextrose 5 % 50 mL IVPB     2 g 100 mL/hr over 30 Minutes Intravenous Every 24 hours 01/27/17 0908     01/27/17 1300  rifaximin (XIFAXAN) tablet 550 mg     550 mg Oral 2 times  daily 01/27/17 1138     01/27/17 1000  Sofosbuvir-Velpatasvir 400-100 MG TABS 1 tablet     1 tablet Oral Daily 01/26/17 1658     01/27/17 1000  ribavirin (REBETOL) capsule 600 mg  Status:  Discontinued    Comments:  600 mg in the morning and 400 mg in the late afternoon     600 mg Oral Daily 01/26/17 1658 01/27/17  0907   01/26/17 1500  cefTRIAXone (ROCEPHIN) 2 g in dextrose 5 % 50 mL IVPB     2 g 100 mL/hr over 30 Minutes Intravenous  Once 01/26/17 1453 01/26/17 1638      Assessment/Plan: Symptomatic umbilical hernia in patient with cirrhosis and ascites.  I have reviewed the CT scan which shows only fluid in her hernia which is likely coming through a very small defect.  Due to her ascites and liver disease she would be very high risk for wound healing problems, ascitic leak and other complications from surgery.  There is no bowel obstruction or even bowel in the hernia to indicate urgent need for repair.  She apparently has been declined for surgery at [facility].  I would not recommend surgical repair at this time.  If this is felt to be so symptomatic that repair is necessary then I think she should be referred back to [facility] for reevaluation.    LOS: 2 days    Mariella SaaBenjamin T Zoe Goonan 01/28/2017
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
## 2017-01-28 NOTE — Progress Notes (Addendum)
[facility] Progress Note  Subjective: no urine output recorded, creat level at 2.6, no urine studies done yet. Making urine per pt.  No SOB or CP, +abd pain.   Vitals:   01/27/17 1407 01/27/17 1941 01/27/17 2056 01/28/17 0534  BP:   120/74 111/60  Pulse:   66 64  Resp:   19 20  Temp:   97.7 F (36.5 C) 98 F (36.7 C)  TempSrc:   Oral Oral  SpO2: 98% 98% 100% 99%  Weight:      Height:        Inpatient medications: . folic acid  1 mg Oral Daily  . ipratropium-albuterol  3 mL Nebulization QID  . lactulose  30 g Oral TID  . midodrine  7.5 mg Oral TID WC  . rifaximin  550 mg Oral BID  . sodium chloride flush  3 mL Intravenous Q12H  . Sofosbuvir-Velpatasvir  1 tablet Oral Daily  . thiamine  100 mg Oral Daily   . sodium chloride    . albumin human    . cefTRIAXone (ROCEPHIN)  IV Stopped (01/27/17 1620)  . octreotide  (SANDOSTATIN)    IV infusion 50 mcg/hr (01/28/17 0757)   sodium chloride, albuterol, DULoxetine, morphine injection, ondansetron **OR** ondansetron (ZOFRAN) IV, sodium chloride flush, traZODone  Exam: Gen slow speech, Ox 3 Sclera anicteric, throat clear  No jvd or bruits Chest clear bilat RRR no MRG Abd firm, nontender, liver down 5 cm, poss ascites, +bs Ext diffuse nonpit LE edema 2+ from ankles to hips Neuro is alert, Ox 3 , nf    Impression: 1  AKI suspect HRS 2  Hyperkalemia 3  Hep C 4  SBP 5  Cirrhosis 6  Vol +leg edema  Plan - cont alb/ midodrine/ octreotide, f/u labs, kayex x 1   Vinson Moselleob Iman Reinertsen MD [facility] pager [phone]   01/28/2017, 10:11 AM   Recent Labs  Lab 01/27/17 0616 01/27/17 1355 01/28/17 0514  NA 132* 132* 132*  K 5.3* 5.4* 6.1*  CL 107 107 107  CO2 22 19* 20*  GLUCOSE 94 175* 100*  BUN 30* 34* 39*  CREATININE 2.72* 2.66* 2.68*  CALCIUM 8.3* 8.4* 8.4*   Recent Labs  Lab 01/26/17 0752 01/27/17 0616 01/28/17 0514  AST 38 38 49*  ALT 21 20 24   ALKPHOS 145* 102 101  BILITOT  1.7* 1.7* 1.6*  PROT 6.6 7.3 7.8  ALBUMIN 1.9* 2.5* 2.6*   Recent Labs  Lab 01/26/17 0752 01/27/17 0616 01/28/17 0514  WBC 7.2 5.9 7.1  HGB 9.6* 9.4* 9.9*  HCT 29.3* 29.1* 30.6*  MCV 110.2* 111.1* 110.9*  PLT 130* 131* 150   Iron/TIBC/Ferritin/ %Sat No results found for: IRON, TIBC, FERRITIN, IRONPCTSAT

## 2017-01-28 NOTE — Plan of Care (Signed)
Patient pain level remains at 10

## 2017-01-28 NOTE — Progress Notes (Signed)
PT Cancellation Note  Patient Details Name: Linda Vaughn MRN: [id number] DOB: Aug 06, 1965   Cancelled Treatment:    Reason Eval/Treat Not Completed: Pain limiting ability to participate;Fatigue/lethargy limiting ability to participate(pt reports 10/10 abdominal pain and family stated pt was "up all night and is too tired to walk right now". Family reports pt is independent with mobility and ADLs at baseline. Will check back tomorrow. )   Tamala SerUhlenberg, Manjinder Breau Kistler 01/28/2017, 2:11 PM [phone]

## 2017-01-28 NOTE — Progress Notes (Signed)
Linda Vaughn Progress Note  Chief Complaint:    Cirrhosis  Subjective: Wants hernia repaired. Doesn't care if she dies because quality of life is poor with the pain  Objective:  Vital signs in last 24 hours: Temp:  [97.7 F (36.5 C)-98 F (36.7 C)] 98 F (36.7 C) (01/23 0534) Pulse Rate:  [64-67] 64 (01/23 0534) Resp:  [10-20] 20 (01/23 0534) BP: (105-120)/(60-74) 111/60 (01/23 0534) SpO2:  [98 %-100 %] 99 % (01/23 1117) Last BM Date: 01/27/17 General:   Alert, well-developed, black female in NAD EENT:  Normal hearing, non icteric sclera, conjunctive pink.  Heart:  Regular rate and rhythm; no murmurs. No lower extremity edema Pulm: Normal respiratory effor. Abdomen:  Soft, non-distended.  Normal bowel sounds,.    Neurologic:  Alert and  oriented x4;  grossly normal neurologically. Psych:  cooperative.  Normal mood and affect.   Intake/Output from previous day: 01/22 0701 - 01/23 0700 In: 1700.8 [P.O.:240; I.V.:1410.8; IV Piggyback:50] Out: 400  Intake/Output this shift: No intake/output data recorded.  Lab Results: Recent Labs    01/26/17 0752 01/27/17 0616 01/28/17 0514  WBC 7.2 5.9 7.1  HGB 9.6* 9.4* 9.9*  HCT 29.3* 29.1* 30.6*  PLT 130* 131* 150   BMET Recent Labs    01/27/17 0616 01/27/17 1355 01/28/17 0514  NA 132* 132* 132*  K 5.3* 5.4* 6.1*  CL 107 107 107  CO2 22 19* 20*  GLUCOSE 94 175* 100*  BUN 30* 34* 39*  CREATININE 2.72* 2.66* 2.68*  CALCIUM 8.3* 8.4* 8.4*   LFT Recent Labs    01/28/17 0514  PROT 7.8  ALBUMIN 2.6*  AST 49*  ALT 24  ALKPHOS 101  BILITOT 1.6*     [place] Paracentesis  Result Date: 01/26/2017 INDICATION: Cirrhosis, hepatitis-C, ascites. Request made for diagnostic and therapeutic paracentesis. EXAM: ULTRASOUND GUIDED DIAGNOSTIC AND THERAPEUTIC PARACENTESIS MEDICATIONS: None. COMPLICATIONS: None immediate. PROCEDURE: Informed written consent was obtained from the patient after a discussion of the  risks, benefits and alternatives to treatment. A timeout was performed prior to the initiation of the procedure. Initial ultrasound scanning demonstrates a small to moderate amount of ascites within the right upper to mid abdominal quadrant. The right upper to mid abdomen was prepped and draped in the usual sterile fashion. 1% lidocaine was used for local anesthesia. Following this, a Yueh catheter was introduced. An ultrasound image was saved for documentation purposes. The paracentesis was performed. The catheter was removed and a dressing was applied. The patient tolerated the procedure well without immediate post procedural complication. FINDINGS: A total of approximately 2.2 liters of slightly hazy, yellow fluid was removed. Samples were sent to the laboratory as requested by the clinical team. IMPRESSION: Successful ultrasound-guided diagnostic and therapeutic paracentesis yielding 2.2 liters of peritoneal fluid. Read by: Jeananne RamaKevin Allred, PA-C Electronically Signed   By: Malachy MoanHeath  McCullough M.D.   On: 01/26/2017 12:27   Dg Chest Port 1 View  Result Date: 01/27/2017 CLINICAL DATA:  Severe shortness of breath, cough, congestion EXAM: PORTABLE CHEST 1 VIEW COMPARISON:  05/25/2015 FINDINGS: Low lung volumes. Heart size is borderline, accentuated by the AP portable nature of the study and the low volumes. Mild vascular congestion and bibasilar opacities, likely atelectasis. IMPRESSION: Low lung volumes. Mild vascular congestion and bibasilar atelectasis. Electronically Signed   By: Charlett NoseKevin  Dover M.D.   On: 01/27/2017 11:38    ASSESSMENT / PLAN:   1. 52 yo female with decompensated cirrhosis with encephalopathy and recurrent SBP.  Gram stain negative. Fluid cx negative at day 2 and Bcx negative at day 1. She is afebrile and WBC is normal.  -IV albumin today for SBP -continue Rocephin for SBP -continue xifaxan and lactulose -continue to hold Beta blocker  -consider repeat dx tap to make sure SBP  resolved -GI/Liver follow up with Dr. Joellyn Rued at [facility]  2. AKI / ? HRS. Nephrology saw yesterday. Already on appropriate tx with albumin, octreotide and midodrine. Cr still same as yesterday at 2.68  3. Symptomatic umbilical hernia. Appreciate Surgery consult. Only fluid, no bowel in hernia. Surgical repair not recommendation at this time. As said before she would be high risk for healing, ascitic fluid leaks, further hepatic decompensation. She is upset, says the pain is draining her and wants repair not matter what risks are. [facility] has recommended she return to [facility] for reevaluation if continues to be so symptomatic.  Defer to primary service for pain mgmt.     Principal Problem:   SBP (spontaneous bacterial peritonitis) (HCC) Active Problems:   Cholelithiasis   HTN (hypertension)   Tobacco dependence   Umbilical hernia without obstruction and without gangrene   Ascites   Hepatic cirrhosis (HCC)   Chronic hepatitis C without hepatic coma (HCC)    LOS: 2 days   Willette Cluster ,NP 01/28/2017, 11:40 AM Pager number [phone]     Attending physician's note   I have taken an interval history, reviewed the chart and examined the patient. I agree with the Advanced Practitioner's note, impression and recommendations.   Claudette Head, MD [facility] [phone] Mon-Fri 8a-5p [phone] after 5p, weekends, holidays

## 2017-01-28 NOTE — Progress Notes (Signed)
PROGRESS NOTE  MADDI COLLAR Vaughn:[id number] DOB: 09-26-1965 DOA: 01/26/2017 PCP: [name], FNP  HPI/Recap of past 24 hours:  Linda Vaughn is a 52 y.o. year old female with medical history significant for cirrhosis, possibly from remote ETOH abuse  + HCV, HTN, abdominal hernia  who presented on 01/26/2017 with abdominal pain, increased abdominal distention and chills for several days and was found to have SBP, AKI, hepatic encephalopathy and acute on chronic worsening abdominal pain secondary to hernia.    This morning complains of abdominal pain but seems somewhat better. Understands the need to not give too much IV pain medication in light of her liver  Assessment/Plan: Principal Problem:   SBP (spontaneous bacterial peritonitis) (HCC) Active Problems:   Cholelithiasis   HTN (hypertension)   Tobacco dependence   Umbilical hernia without obstruction and without gangrene   Ascites   Hepatic cirrhosis (HCC)   Chronic hepatitis C without hepatic coma (HCC)    Decompensated cirrhosis secondary to hepatitis C with hepatic encephalopathy and recurrent SBP, stable.  Patient status post therapeutic/diagnostic paracentesis on 1/21.  GI following.  Continue ceftriaxone, IV albumin for SBP.  Continue rifaximin and lactulose for hepatic encephalopathy.  Follow recommendations to hold beta-blocker.  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  GI renal following.  We will continue albumin, octreotide, monitor.  Status post l kayexylate x1 and insulin w/ D50 for hyperkalemia.  We will continue to monitor  Symptomatic umbilical hernia, likely exacerbated in the setting of ascites.  Patient reports chronic hernia that is chronically nonreducible.  Surgery evaluated currently patient is not a candidate given comorbidities.  Concerns explained to patient by surgical consultants and myself.  Last evaluated as an outpatient by week for his surgical team who felt patient's I am  not a candidate for repair given no obstruction patient's multiple comorbidities.  We will continue to monitor for any worsening symptoms. Decreased morphine frequency for only severe breakthrough pain and added PRN oxycodone IR q4H for pain control.  Upper respiratory tract wheezing,, stable.  Chest x-ray done 1/22 showed mild vascular congestion with bibasilar atelectasis.  Continue supportive care with duo nebs, incentive spirometry, PRN albuterol  Chronic anemia and thrombocytopenia, likely secondary to hepatic pathology as mentioned above, stable.  We will continue to monitor holding off on any anticoagulation prophylactic products.  Continue SCDs.  Code Status: Full Code  Family Communication: No family at bedside  Disposition Plan: rx SBP, monitor renal function and abdomen s/p paracentesis and assure no incarceration of umbilical hernia   Consultants:  Nephrology, GI, Surgery   Procedures:  Ultrasound guided Paracentesis: 01/26/17: 2.2 L yellow fluid removed  Antimicrobials:  Ceftriaxone 1/21>>  Rifaximin 1/21>>  Cultures: Ascitic Culture 01/26/17: NGTD Blood cultures x2 01/26/17: NGTD  DVT prophylaxis: SCDs   Objective: Vitals:   01/27/17 2056 01/28/17 0534 01/28/17 1117 01/28/17 1300  BP: 120/74 111/60  120/86  Pulse: 66 64  64  Resp: 19 20  19   Temp: 97.7 F (36.5 C) 98 F (36.7 C)  98 F (36.7 C)  TempSrc: Oral Oral  Oral  SpO2: 100% 99% 99% 100%  Weight:      Height:        Intake/Output Summary (Last 24 hours) at 01/28/2017 1704 Last data filed at 01/28/2017 0930 Gross per 24 hour  Intake 495 ml  Output -  Net 495 ml   Filed Weights   01/26/17 0736  Weight: 81.6 kg (180 lb)  Exam:  General: Lying in bed, in no apparent distres Eyes: EOM Neck: normal, no appreciable JVD Cardiovascular: regular rate and rhythm, no murmurs, rubs or gallops,  2+ pitting edema of bilateral lower legs Respiratory: Audible wheezing (on auscultation only heard  in upper respiratory tract), no rales or rhonchi appreciated, no respiratory distress on room air Abdomen: tender throughout with no guarding or rebound tenderness, noticeably distended with protruding, non-reducible umbilical hernia with superficial ulcerations,  Skin: No Rash Musculoskeletal:No clubbing / cyanosis. Neurologic: Grossly no focal neuro deficit.Mental status AAOx3, no asterixis, speech normal, Psychiatric:Appropriate affect, and mood  Data Reviewed: CBC: Recent Labs  Lab 01/26/17 0752 01/27/17 0616 01/28/17 0514  WBC 7.2 5.9 7.1  HGB 9.6* 9.4* 9.9*  HCT 29.3* 29.1* 30.6*  MCV 110.2* 111.1* 110.9*  PLT 130* 131* 150   Basic Metabolic Panel: Recent Labs  Lab 01/26/17 0752 01/27/17 0616 01/27/17 1355 01/28/17 0514 01/28/17 1622  NA 133* 132* 132* 132*  --   K 4.4 5.3* 5.4* 6.1* 4.8  CL 108 107 107 107  --   CO2 22 22 19* 20*  --   GLUCOSE 91 94 175* 100*  --   BUN 17 30* 34* 39*  --   CREATININE 1.00 2.72* 2.66* 2.68*  --   CALCIUM 8.1* 8.3* 8.4* 8.4*  --    GFR: Estimated Creatinine Clearance: 23 mL/min (A) (by C-G formula based on SCr of 2.68 mg/dL (H)). Liver Function Tests: Recent Labs  Lab 01/26/17 0752 01/27/17 0616 01/28/17 0514  AST 38 38 49*  ALT 21 20 24   ALKPHOS 145* 102 101  BILITOT 1.7* 1.7* 1.6*  PROT 6.6 7.3 7.8  ALBUMIN 1.9* 2.5* 2.6*   Recent Labs  Lab 01/26/17 0752  LIPASE 35   No results for input(s): AMMONIA in the last 168 hours. Coagulation Profile: Recent Labs  Lab 01/28/17 1345  INR 1.67   Cardiac Enzymes: No results for input(s): CKTOTAL, CKMB, CKMBINDEX, TROPONINI in the last 168 hours. BNP (last 3 results) No results for input(s): PROBNP in the last 8760 hours. HbA1C: No results for input(s): HGBA1C in the last 72 hours. CBG: Recent Labs  Lab 01/28/17 1515 01/28/17 1618  GLUCAP 136* 117*   Lipid Profile: No results for input(s): CHOL, HDL, LDLCALC, TRIG, CHOLHDL, LDLDIRECT in the last 72  hours. Thyroid Function Tests: No results for input(s): TSH, T4TOTAL, FREET4, T3FREE, THYROIDAB in the last 72 hours. Anemia Panel: No results for input(s): VITAMINB12, FOLATE, FERRITIN, TIBC, IRON, RETICCTPCT in the last 72 hours. Urine analysis:    Component Value Date/Time   COLORURINE AMBER (A) 12/19/2016 2025   APPEARANCEUR HAZY (A) 12/19/2016 2025   LABSPEC 1.020 12/19/2016 2025   PHURINE 6.0 12/19/2016 2025   GLUCOSEU NEGATIVE 12/19/2016 2025   HGBUR NEGATIVE 12/19/2016 2025   BILIRUBINUR NEGATIVE 12/19/2016 2025   KETONESUR NEGATIVE 12/19/2016 2025   PROTEINUR NEGATIVE 12/19/2016 2025   UROBILINOGEN >=8.0 03/12/2016 1138   NITRITE NEGATIVE 12/19/2016 2025   LEUKOCYTESUR NEGATIVE 12/19/2016 2025   Sepsis Labs: @LABRCNTIP (procalcitonin:4,lacticidven:4)  ) Recent Results (from the past 240 hour(s))  Culture, body fluid-bottle     Status: None (Preliminary result)   Collection Time: 01/26/17 12:01 PM  Result Value Ref Range Status   Specimen Description ASCITIC  Final   Special Requests NONE  Final   Culture   Final    NO GROWTH 2 DAYS Performed at [facility], [address] [place], [place] [address]    Report Status  PENDING  Incomplete  Gram stain     Status: None   Collection Time: 01/26/17 12:01 PM  Result Value Ref Range Status   Specimen Description ASCITIC  Final   Special Requests NONE  Final   Gram Stain   Final    MODERATE WBC PRESENT, PREDOMINANTLY MONONUCLEAR NO ORGANISMS SEEN Performed at [facility], [address] [place], [place] [address]    Report Status 01/26/2017 FINAL  Final  Culture, blood (Routine X 2) w Reflex to ID Panel     Status: None (Preliminary result)   Collection Time: 01/26/17  7:31 PM  Result Value Ref Range Status   Specimen Description BLOOD LEFT ANTECUBITAL  Final   Special Requests   Final    BOTTLES DRAWN AEROBIC AND ANAEROBIC Blood Culture adequate volume   Culture   Final    NO GROWTH 1 DAY Performed  at [facility], [address], [place], [place] [address]    Report Status PENDING  Incomplete  Culture, blood (Routine X 2) w Reflex to ID Panel     Status: None (Preliminary result)   Collection Time: 01/26/17  9:53 PM  Result Value Ref Range Status   Specimen Description BLOOD LEFT ARM  Final   Special Requests   Final    BOTTLES DRAWN AEROBIC AND ANAEROBIC Blood Culture adequate volume   Culture   Final    NO GROWTH 1 DAY Performed at [facility], [address], [place], [place] [address]    Report Status PENDING  Incomplete      Studies: No results found.  Scheduled Meds: . folic acid  1 mg Oral Daily  . ipratropium-albuterol  3 mL Nebulization BID  . lactulose  30 g Oral TID  . midodrine  7.5 mg Oral TID WC  . rifaximin  550 mg Oral BID  . sodium chloride flush  3 mL Intravenous Q12H  . Sofosbuvir-Velpatasvir  1 tablet Oral Daily  . thiamine  100 mg Oral Daily    Continuous Infusions: . sodium chloride    . [START ON 01/29/2017] albumin human    . cefTRIAXone (ROCEPHIN)  IV Stopped (01/27/17 1620)  . octreotide  (SANDOSTATIN)    IV infusion 50 mcg/hr (01/28/17 0757)     LOS: 2 days     Laverna Peace, MD [facility] Pager [phone]  If 7PM-7AM, please contact night-coverage [email or web address] Password [facility] 01/28/2017, 5:04 PM

## 2017-01-29 ENCOUNTER — Inpatient Hospital Stay (HOSPITAL_COMMUNITY): Payer: Medicaid Other

## 2017-01-29 DIAGNOSIS — N179 Acute kidney failure, unspecified: Secondary | ICD-10-CM | POA: Diagnosis present

## 2017-01-29 LAB — URINALYSIS, ROUTINE W REFLEX MICROSCOPIC
Bilirubin Urine: NEGATIVE
Glucose, UA: NEGATIVE mg/dL
Hgb urine dipstick: NEGATIVE
KETONES UR: NEGATIVE mg/dL
Nitrite: NEGATIVE
PROTEIN: NEGATIVE mg/dL
Specific Gravity, Urine: 1.016 (ref 1.005–1.030)
pH: 5 (ref 5.0–8.0)

## 2017-01-29 LAB — BASIC METABOLIC PANEL
ANION GAP: 10 (ref 5–15)
BUN: 36 mg/dL — ABNORMAL HIGH (ref 6–20)
CHLORIDE: 107 mmol/L (ref 101–111)
CO2: 17 mmol/L — ABNORMAL LOW (ref 22–32)
Calcium: 8.4 mg/dL — ABNORMAL LOW (ref 8.9–10.3)
Creatinine, Ser: 1.79 mg/dL — ABNORMAL HIGH (ref 0.44–1.00)
GFR calc non Af Amer: 31 mL/min — ABNORMAL LOW (ref >60)
GFR, EST AFRICAN AMERICAN: 36 mL/min — AB (ref >60)
GLUCOSE: 134 mg/dL — AB (ref 65–99)
POTASSIUM: 4.6 mmol/L (ref 3.5–5.1)
Sodium: 134 mmol/L — ABNORMAL LOW (ref 135–145)

## 2017-01-29 LAB — POTASSIUM
Potassium: 5 mmol/L (ref 3.5–5.1)
Potassium: 4.5 mmol/L (ref 3.5–5.1)

## 2017-01-29 LAB — CREATININE, URINE, RANDOM: Creatinine, Urine: 121.03 mg/dL

## 2017-01-29 LAB — SODIUM, URINE, RANDOM: SODIUM UR: 39 mmol/L

## 2017-01-29 MED ORDER — DOCUSATE SODIUM 100 MG PO CAPS
100.0000 mg | ORAL_CAPSULE | Freq: Two times a day (BID) | ORAL | Status: DC
  Filled 2017-01-29 (×3): qty 1

## 2017-01-29 MED ORDER — FENTANYL CITRATE (PF) 100 MCG/2ML IJ SOLN
12.5000 ug | Freq: Four times a day (QID) | INTRAMUSCULAR | Status: DC | PRN
  Administered 2017-01-29 – 2017-01-30 (×3): 12.5 ug via INTRAVENOUS
  Filled 2017-01-29 (×3): qty 2

## 2017-01-29 NOTE — Plan of Care (Signed)
  Progressing Health Behavior/Discharge Planning: Ability to manage health-related needs will improve 01/29/2017 1046 - Progressing by Gwendlyn DeutscherStevens, Mariano Doshi E, RN Clinical Measurements: Ability to maintain clinical measurements within normal limits will improve 01/29/2017 1046 - Progressing by Gwendlyn DeutscherStevens, Delayne Sanzo E, RN Will remain free from infection 01/29/2017 1046 - Progressing by Gwendlyn DeutscherStevens, Callan Norden E, RN Note On IV abx.  Diagnostic test results will improve 01/29/2017 1046 - Progressing by Gwendlyn DeutscherStevens, Nyra Anspaugh E, RN Note For abd xray this AM.  Respiratory complications will improve 01/29/2017 1046 - Progressing by Gwendlyn DeutscherStevens, Jasmeet Manton E, RN Note Pt reports resp status has improved since paracentesis.  Cardiovascular complication will be avoided 01/29/2017 1046 - Progressing by Gwendlyn DeutscherStevens, Philmore Lepore E, RN Activity: Risk for activity intolerance will decrease 01/29/2017 1046 - Progressing by Gwendlyn DeutscherStevens, Jordyn Hofacker E, RN Note One assist oob.  Coping: Level of anxiety will decrease 01/29/2017 1046 - Progressing by Gwendlyn DeutscherStevens, Keshonna Valvo E, RN Pain Managment: General experience of comfort will improve 01/29/2017 1046 - Progressing by Gwendlyn DeutscherStevens, Tyneshia Stivers E, RN Note Pain med changed to Fentanyl this AM. Will monitor for effect.

## 2017-01-29 NOTE — Progress Notes (Signed)
[facility] Progress Note  Chief Complaint:   cirrhosis  Subjective: Crying because of abdominal pain at hernia site.     Attending physician's note   I have taken an interval history, reviewed the chart and examined the patient. I agree with the Advanced Practitioner's note, impression and recommendations.   Claudette Head, MD [facility] [phone] Mon-Fri 8a-5p [phone] after 5p, weekends, holidays  ASSESSMENT / PLAN:   1. 52 yo female with decompensated cirrhosis with HE and recurrent SBP. She had 2 liters ascitic fluid removed this admission. Gram stain negative. So far fluid and blood cx negative. Cytology negative.  On Rocephin. Tmax 99.8.  -HE improved with lactulose and xifaxan.  -Received albumin day 1 and day 3 for SBP. Getting again today (for HRS).  -She is distended, probably ascites, but also tympanitic. Will get KUB and if no significant ileus will send for paracentesis.  I spoke with Renal and Dr. Arlean Hopping is comfortable with removal of another 2-3 liters of ascites which may take off some of the pressure on hernia and hopefully give her some relief, see #2.  Will repeat cell count on fluid. -continue low sodium diet -Upon discharge she will need to follow up with [facility] GI / Hepatology   2. Symptomatic umbilical hernia. No incarceration, doesn't even contain bowel. Surgery not planning to operate. If patient remains so symptomatic then she will need to return to [facility] and see if they will reconsider surgical repair of hernia.    Objective:  Vital signs in last 24 hours: Temp:  [98 F (36.7 C)-99.8 F (37.7 C)] 99.8 F (37.7 C) (01/24 0450) Pulse Rate:  [59-71] 71 (01/24 0748) Resp:  [18-20] 18 (01/24 0748) BP: (120-130)/(74-86) 120/74 (01/24 0450) SpO2:  [97 %-100 %] 100 % (01/24 0748) Weight:  [209 lb 10.5 oz (95.1 kg)] 209 lb 10.5 oz (95.1 kg) (01/24 0450) Last BM Date: 01/29/17 General:   Alert, well-developed, black female in  NAD EENT:  Normal hearing, non icteric sclera, conjunctive pink.  Heart:  Regular rate and rhythm, 2+ bilateral lower extremity edema Pulm: Normal respiratory effort, lungs CTA bilaterally without wheezes or crackles. Abdomen:  Soft, distended with tympany but a few bowel sounds. Large firm , tender umbilical hernia. .    Neurologic:  Alert and  oriented x4;  grossly normal neurologically. Psych:  Pleasant, cooperative.  Normal mood and affect.   Intake/Output from previous day: 01/23 0701 - 01/24 0700 In: 880.8 [P.O.:600; I.V.:280.8] Out: 300 [Urine:300] Intake/Output this shift: Total I/O In: 1334.2 [P.O.:240; I.V.:94.2; IV Piggyback:1000] Out: -   Lab Results: Recent Labs    01/27/17 0616 01/28/17 0514  WBC 5.9 7.1  HGB 9.4* 9.9*  HCT 29.1* 30.6*  PLT 131* 150   BMET Recent Labs    01/27/17 1355 01/28/17 0514 01/28/17 1622 01/28/17 2042 01/29/17 0025 01/29/17 0431  NA 132* 132*  --   --   --  134*  K 5.4* 6.1* 4.8 5.6* 5.0 4.6  4.5  CL 107 107  --   --   --  107  CO2 19* 20*  --   --   --  17*  GLUCOSE 175* 100* 94  --   --  134*  BUN 34* 39*  --   --   --  36*  CREATININE 2.66* 2.68*  --   --   --  1.79*  CALCIUM 8.4* 8.4*  --   --   --  8.4*  LFT Recent Labs    01/28/17 0514  PROT 7.8  ALBUMIN 2.6*  AST 49*  ALT 24  ALKPHOS 101  BILITOT 1.6*   PT/INR Recent Labs    01/28/17 1345  LABPROT 19.5*  INR 1.67    Principal Problem:   SBP (spontaneous bacterial peritonitis) (HCC) Active Problems:   Cholelithiasis   HTN (hypertension)   Tobacco dependence   Umbilical hernia without obstruction and without gangrene   Ascites   Hepatic cirrhosis (HCC)   Chronic hepatitis C without hepatic coma (HCC)    LOS: 3 days   Willette ClusterPaula Guenther ,NP 01/29/2017, 11:41 AM Pager number [phone]

## 2017-01-29 NOTE — Evaluation (Signed)
Physical Therapy Evaluation Patient Details Name: Linda Vaughn MRN: [id number] DOB: 07/10/1965 Today's Date: 01/29/2017   History of Present Illness  52 y.o. female with past medical history relevant for umbilical hernia, liver cirrhosis secondary to hep C presents with abdominal pain, increased abdominal distention and chills for several days. Dx of peritonitis  Clinical Impression  Pt is independent with mobility, she ambulated 120' without an assistive device and had no loss of balance. She is ready to DC home from a PT standpoint.     Follow Up Recommendations No PT follow up    Equipment Recommendations  None recommended by PT    Recommendations for Other Services       Precautions / Restrictions Precautions Precautions: None Restrictions Weight Bearing Restrictions: No      Mobility  Bed Mobility Overal bed mobility: Modified Independent             General bed mobility comments: HOB up, used rail  Transfers Overall transfer level: Independent Equipment used: None                Ambulation/Gait Ambulation/Gait assistance: Independent Ambulation Distance (Feet): 120 Feet Assistive device: None Gait Pattern/deviations: WFL(Within Functional Limits);Decreased stride length   Gait velocity interpretation: at or above normal speed for age/gender General Gait Details: steady, no loss of balance  Stairs            Wheelchair Mobility    Modified Rankin (Stroke Patients Only)       Balance Overall balance assessment: No apparent balance deficits (not formally assessed)                                           Pertinent Vitals/Pain Pain Assessment: 0-10 Pain Score: 8  Pain Location: umbilical hernia Pain Descriptors / Indicators: Sore Pain Intervention(s): Limited activity within patient's tolerance;Monitored during session;Premedicated before session    Home Living Family/patient expects to be discharged to::  Private residence Living Arrangements: Spouse/significant other Available Help at Discharge: Available PRN/intermittently   Home Access: Level entry     Home Layout: One level Home Equipment: None      Prior Function Level of Independence: Independent               Hand Dominance        Extremity/Trunk Assessment   Upper Extremity Assessment Upper Extremity Assessment: Overall WFL for tasks assessed    Lower Extremity Assessment Lower Extremity Assessment: Overall WFL for tasks assessed    Cervical / Trunk Assessment Cervical / Trunk Assessment: Normal  Communication   Communication: No difficulties  Cognition Arousal/Alertness: Awake/alert Behavior During Therapy: WFL for tasks assessed/performed Overall Cognitive Status: Within Functional Limits for tasks assessed                                        General Comments      Exercises     Assessment/Plan    PT Assessment Patent does not need any further PT services  PT Problem List         PT Treatment Interventions      PT Goals (Current goals can be found in the Care Plan section)  Acute Rehab PT Goals Patient Stated Goal: play with 5 y.o. grandaughter PT Goal Formulation: All assessment and education  complete, DC therapy    Frequency     Barriers to discharge        Co-evaluation               AM-PAC PT "6 Clicks" Daily Activity  Outcome Measure Difficulty turning over in bed (including adjusting bedclothes, sheets and blankets)?: None Difficulty moving from lying on back to sitting on the side of the bed? : None Difficulty sitting down on and standing up from a chair with arms (e.g., wheelchair, bedside commode, etc,.)?: None Help needed moving to and from a bed to chair (including a wheelchair)?: None Help needed walking in hospital room?: None Help needed climbing 3-5 steps with a railing? : None 6 Click Score: 24    End of Session   Activity Tolerance:  Patient tolerated treatment well Patient left: in chair;with call bell/phone within reach;with chair alarm set Nurse Communication: Mobility status      Time: [id number] PT Time Calculation (min) (ACUTE ONLY): 17 min   Charges:   PT Evaluation $PT Eval Low Complexity: 1 Low     PT G Codes:          Tamala SerUhlenberg, [place] Liz Kistler 01/29/2017, 12:19 PM [phone]

## 2017-01-29 NOTE — Progress Notes (Signed)
[facility] Progress Note  Subjective: 300 cc UOP yest, creat down to 1.8 today, going for paracentesis, no new c/o.  No sob .   Vitals:   01/28/17 1925 01/28/17 2118 01/29/17 0450 01/29/17 0748  BP:  130/79 120/74   Pulse:  (!) 59 70 71  Resp:  20 20 18   Temp:  98.5 F (36.9 C) 99.8 F (37.7 C)   TempSrc:  Oral Oral   SpO2: 100% 100% 97% 100%  Weight:   95.1 kg (209 lb 10.5 oz)   Height:        Inpatient medications: . folic acid  1 mg Oral Daily  . ipratropium-albuterol  3 mL Nebulization BID  . lactulose  30 g Oral TID  . midodrine  7.5 mg Oral TID WC  . rifaximin  550 mg Oral BID  . sodium chloride flush  3 mL Intravenous Q12H  . Sofosbuvir-Velpatasvir  1 tablet Oral Daily  . thiamine  100 mg Oral Daily   . sodium chloride    . albumin human    . cefTRIAXone (ROCEPHIN)  IV Stopped (01/29/17 1430)  . octreotide  (SANDOSTATIN)    IV infusion 50 mcg/hr (01/29/17 0658)   sodium chloride, albuterol, DULoxetine, fentaNYL (SUBLIMAZE) injection, ondansetron **OR** ondansetron (ZOFRAN) IV, oxyCODONE, sodium chloride flush, traZODone  Exam: Gen alert, Ox 3 Sclera anicteric, throat clear  No jvd or bruits Chest clear bilat RRR no MRG Abd nontender, liver down 5 cm, 2-3+ ascites, +bs Ext diffuse nonpit LE edema 2+ from ankles to hips Neuro is alert, Ox 3 , nf    Impression: 1  AKI - suspected HRS, improving, cont midodrine/ octreotide, last albumin due today 2  Hyperkalemia - better,. Cont renal diet 3  Hep C 4  SBP 5  Cirrhosis/ ascites - recommend not greater than 2-3 L paracentesis for this pt 6  Vol +leg edema  Plan - as above   Vinson Moselleob Emanuelle Bastos MD [facility] pager [phone]   01/29/2017, 2:36 PM   Recent Labs  Lab 01/27/17 1355 01/28/17 0514 01/28/17 1622 01/28/17 2042 01/29/17 0025 01/29/17 0431  NA 132* 132*  --   --   --  134*  K 5.4* 6.1* 4.8 5.6* 5.0 4.6  4.5  CL 107 107  --   --   --  107  CO2 19* 20*  --    --   --  17*  GLUCOSE 175* 100* 94  --   --  134*  BUN 34* 39*  --   --   --  36*  CREATININE 2.66* 2.68*  --   --   --  1.79*  CALCIUM 8.4* 8.4*  --   --   --  8.4*   Recent Labs  Lab 01/26/17 0752 01/27/17 0616 01/28/17 0514  AST 38 38 49*  ALT 21 20 24   ALKPHOS 145* 102 101  BILITOT 1.7* 1.7* 1.6*  PROT 6.6 7.3 7.8  ALBUMIN 1.9* 2.5* 2.6*   Recent Labs  Lab 01/26/17 0752 01/27/17 0616 01/28/17 0514  WBC 7.2 5.9 7.1  HGB 9.6* 9.4* 9.9*  HCT 29.3* 29.1* 30.6*  MCV 110.2* 111.1* 110.9*  PLT 130* 131* 150   Iron/TIBC/Ferritin/ %Sat No results found for: IRON, TIBC, FERRITIN, IRONPCTSAT

## 2017-01-29 NOTE — Progress Notes (Signed)
Pt presented to US dept today for repeat paracentesis. Last paracentesis done 1/21 yielded 2.2 liters. On today's limited US abd there is only trace ascites present primarily in RUQ and with bowel loops in close proximity, not safely accessible at this time. Procedure cancelled.

## 2017-01-29 NOTE — Progress Notes (Signed)
[facility] Progress Note     Subjective: CC- abdominal pain Patient reports persistent pain from her umbilical hernia. It is not worse with PO intake. States that she ate all of her dinner last night. She is passing flatus and had a BM yesterday. Last paracentesis 1/21.  Objective: Vital signs in last 24 hours: Temp:  [98 F (36.7 C)-99.8 F (37.7 C)] 99.8 F (37.7 C) (01/24 0450) Pulse Rate:  [59-71] 71 (01/24 0748) Resp:  [18-20] 18 (01/24 0748) BP: (120-130)/(74-86) 120/74 (01/24 0450) SpO2:  [97 %-100 %] 100 % (01/24 0748) Weight:  [209 lb 10.5 oz (95.1 kg)] 209 lb 10.5 oz (95.1 kg) (01/24 0450) Last BM Date: 01/29/17  Intake/Output from previous day: 01/23 0701 - 01/24 0700 In: 880.8 [P.O.:600; I.V.:280.8] Out: 300 [Urine:300] Intake/Output this shift: No intake/output data recorded.  PE: Gen:  Alert, NAD HEENT: EOM's intact, pupils equal and round Pulm:  effort normal Abd: Soft, mild distenion, +BS in all 4 quadrants, umbilical hernia tender and taut without overlying skin changes Psych: A&Ox3  Skin: no rashes noted, warm and dry  Lab Results:  Recent Labs    01/27/17 0616 01/28/17 0514  WBC 5.9 7.1  HGB 9.4* 9.9*  HCT 29.1* 30.6*  PLT 131* 150   BMET Recent Labs    01/27/17 1355 01/28/17 0514 01/28/17 1622  01/29/17 0025 01/29/17 0431  NA 132* 132*  --   --   --   --   K 5.4* 6.1* 4.8   < > 5.0 4.5  CL 107 107  --   --   --   --   CO2 19* 20*  --   --   --   --   GLUCOSE 175* 100* 94  --   --   --   BUN 34* 39*  --   --   --   --   CREATININE 2.66* 2.68*  --   --   --   --   CALCIUM 8.4* 8.4*  --   --   --   --    < > = values in this interval not displayed.   PT/INR Recent Labs    01/28/17 1345  LABPROT 19.5*  INR 1.67   CMP     Component Value Date/Time   NA 132 (L) 01/28/2017 0514   K 4.5 01/29/2017 0431   CL 107 01/28/2017 0514   CO2 20 (L) 01/28/2017 0514   GLUCOSE 94 01/28/2017 1622   BUN 39 (H) 01/28/2017  0514   CREATININE 2.68 (H) 01/28/2017 0514   CREATININE 0.73 04/10/2016 1119   CALCIUM 8.4 (L) 01/28/2017 0514   PROT 7.8 01/28/2017 0514   ALBUMIN 2.6 (L) 01/28/2017 0514   AST 49 (H) 01/28/2017 0514   ALT 24 01/28/2017 0514   ALKPHOS 101 01/28/2017 0514   BILITOT 1.6 (H) 01/28/2017 0514   GFRNONAA 19 (L) 01/28/2017 0514   GFRNONAA >89 04/10/2016 1119   GFRAA 22 (L) 01/28/2017 0514   GFRAA >89 04/10/2016 1119   Lipase     Component Value Date/Time   LIPASE 35 01/26/2017 0752       Studies/Results: Dg Chest Port 1 View  Result Date: 01/27/2017 CLINICAL DATA:  Severe shortness of breath, cough, congestion EXAM: PORTABLE CHEST 1 VIEW COMPARISON:  05/25/2015 FINDINGS: Low lung volumes. Heart size is borderline, accentuated by the AP portable nature of the study and the low volumes. Mild vascular congestion and bibasilar opacities, likely atelectasis. IMPRESSION: Low  lung volumes. Mild vascular congestion and bibasilar atelectasis. Electronically Signed   By: Charlett NoseKevin  Dover M.D.   On: 01/27/2017 11:38    Anti-infectives: Anti-infectives (From admission, onward)   Start     Dose/Rate Route Frequency Ordered Stop   01/27/17 1700  ribavirin (REBETOL) capsule 400 mg  Status:  Discontinued     400 mg Oral Daily before supper 01/26/17 2144 01/27/17 0907   01/27/17 1500  cefTRIAXone (ROCEPHIN) 2 g in dextrose 5 % 50 mL IVPB     2 g 100 mL/hr over 30 Minutes Intravenous Every 24 hours 01/27/17 0908     01/27/17 1300  rifaximin (XIFAXAN) tablet 550 mg     550 mg Oral 2 times daily 01/27/17 1138     01/27/17 1000  Sofosbuvir-Velpatasvir 400-100 MG TABS 1 tablet     1 tablet Oral Daily 01/26/17 1658     01/27/17 1000  ribavirin (REBETOL) capsule 600 mg  Status:  Discontinued    Comments:  600 mg in the morning and 400 mg in the late afternoon     600 mg Oral Daily 01/26/17 1658 01/27/17 0907   01/26/17 1500  cefTRIAXone (ROCEPHIN) 2 g in dextrose 5 % 50 mL IVPB     2 g 100 mL/hr over 30  Minutes Intravenous  Once 01/26/17 1453 01/26/17 1638       Assessment/Plan Cirrhosis and ascites - last paracentesis 1/21 Hepatitis C Chronic anemia  Symptomatic umbilical hernia  - CT scan 1/21 showed umbilical hernia containing only fluid, no bowel, no bowel obstruction - not a good surgical candidate due to liver disease - Patient is tolerating diet and having bowel function. She does continue to have pain at umbilical hernia site but there is no indication for urgent surgical intervention. If patient wants to consider repair would [facility]. Could also consider repeat paracentesis to see if decreasing volume of ascites helps pain. General surgery will sign off, please call with concerns.   LOS: 3 days    Franne FortsBrooke A Ina Scrivens , [facility] 01/29/2017, 9:51 AM Pager: [phone] Consults: [phone] Mon-Fri 7:00 am-4:30 pm Sat-Sun 7:00 am-11:30 am

## 2017-01-29 NOTE — Progress Notes (Signed)
PROGRESS NOTE  Linda Vaughn:[id number] DOB: 1965-12-15 DOA: 01/26/2017 PCP: [name], FNP  HPI/Recap of past 24 hours:  Linda Vaughn is a 52 y.o. year old female with medical history significant for cirrhosis, possibly from remote ETOH abuse  + HCV, HTN, abdominal hernia  who presented on 01/26/2017 with abdominal pain, increased abdominal distention and chills for several days and was found to have SBP, AKI, hepatic encephalopathy and acute on chronic worsening abdominal pain secondary to hernia.   Very tearful this morning because of abdominal pain.  States she is tired of having to deal with the long-term prognosis of her cirrhosis.  Assessment/Plan: Principal Problem:   SBP (spontaneous bacterial peritonitis) (HCC) Active Problems:   Cholelithiasis   HTN (hypertension)   Tobacco dependence   Umbilical hernia without obstruction and without gangrene   Ascites   Hepatic cirrhosis (HCC)   Chronic hepatitis C without hepatic coma (HCC)   AKI (acute kidney injury) (HCC)  Decompensated cirrhosis secondary to hepatitis C with hepatic encephalopathy and recurrent SBP, stable.  Patient status post therapeutic/diagnostic paracentesis on 1/2:1 urinalysis no organisms on Gram stain or culture.  Greater than 2000 white blood cells (73% neutrophils).  Unable to receive repeat paracentesis today as not much ascitic fluid in quadrants on ultrasound GI following.  Continue ceftriaxone, IV albumin for SBP.  Continue rifaximin and lactulose for hepatic encephalopathy.  Follow recommendations to hold beta-blocker.  Worsening abdominal pain.  Initially thought secondary to worsening ascites however abdominal ultrasound shows only trace fluid.  Abdominal x-ray shows mild ileus, likely in setting of poor stool flow related to current opioid use (3 charted stool occurrences while on lactulose).  Supportive therapy with adequate bowel regimen and continue to monitor  AKI with hyperkalemia  likely hepatorenal syndrome, improving.  Renal following.  Creatinine slowly downtrending.  We will continue albumin, octreotide, and Midodrine.  Hyperkalemia resolved   Symptomatic umbilical hernia, likely exacerbated in the setting of ascites.  Plan to have ultrasound-guided paracentesis today however not much fluid in any of the any of the quadrants on exam.  Surgery has signed off as patient is not a current surgical candidate.  No signs of incarceration on exam today.  Changed pain control to fentanyl for breakthrough severe pain and continued as needed immediate release oxycodone oral every 4 hours as needed   Upper respiratory tract wheezing, resolved.  Chest x-ray done 1/22 showed mild vascular congestion with bibasilar atelectasis.  Continue supportive care with duo nebs, incentive spirometry, PRN albuterol  Chronic anemia and thrombocytopenia, likely secondary to hepatic pathology as mentioned above, stable.  We will continue to monitor holding off on any anticoagulation prophylactic products.  Continue SCDs.  Code Status: Full Code  Family Communication: No family at bedside  Disposition Plan: rx SBP, monitor renal function and abdomen s/p paracentesis and assure no incarceration of umbilical hernia   Consultants:  Nephrology, GI, Surgery   Procedures:  Ultrasound guided Paracentesis: 01/26/17: 2.2 L yellow fluid removed  Antimicrobials:  Ceftriaxone 1/21>>  Rifaximin 1/21>>  Cultures: Ascitic Culture 01/26/17: NGTD Blood cultures x2 01/26/17: NGTD  DVT prophylaxis: SCDs   Objective: Vitals:   01/28/17 2118 01/29/17 0450 01/29/17 0748 01/29/17 1521  BP: 130/79 120/74  125/67  Pulse: (!) 59 70 71 (!) 58  Resp: 20 20 18 16   Temp: 98.5 F (36.9 C) 99.8 F (37.7 C)  99.2 F (37.3 C)  TempSrc: Oral Oral  Oral  SpO2: 100% 97% 100% 96%  Weight:  95.1 kg (209 lb 10.5 oz)    Height:        Intake/Output Summary (Last 24 hours) at 01/29/2017 1919 Last data filed at  01/29/2017 1800 Gross per 24 hour  Intake 2395 ml  Output 300 ml  Net 2095 ml   Filed Weights   01/26/17 0736 01/29/17 0450  Weight: 81.6 kg (180 lb) 95.1 kg (209 lb 10.5 oz)    Exam:  General: Lying in bed, very tearful during examination,  in no apparent distres Eyes: EOM Neck: normal, no appreciable JVD Cardiovascular: regular rate and rhythm, no murmurs, rubs or gallops,  1+ pitting edema of bilateral lower legs Respiratory: no rales, wheezing, or rhonchi appreciated, no respiratory distress on room air Abdomen: tender throughout with no guarding or rebound tenderness, noticeably distended with protruding, non-reducible umbilical hernia with superficial ulcerations,  Skin: No Rash Musculoskeletal:No clubbing / cyanosis. Neurologic: Grossly no focal neuro deficit.Mental status AAOx3, no asterixis, speech normal, Psychiatric:Appropriate affect, and mood  Data Reviewed: CBC: Recent Labs  Lab 01/26/17 0752 01/27/17 0616 01/28/17 0514  WBC 7.2 5.9 7.1  HGB 9.6* 9.4* 9.9*  HCT 29.3* 29.1* 30.6*  MCV 110.2* 111.1* 110.9*  PLT 130* 131* 150   Basic Metabolic Panel: Recent Labs  Lab 01/26/17 0752 01/27/17 0616 01/27/17 1355 01/28/17 0514 01/28/17 1622 01/28/17 2042 01/29/17 0025 01/29/17 0431  NA 133* 132* 132* 132*  --   --   --  134*  K 4.4 5.3* 5.4* 6.1* 4.8 5.6* 5.0 4.6  4.5  CL 108 107 107 107  --   --   --  107  CO2 22 22 19* 20*  --   --   --  17*  GLUCOSE 91 94 175* 100* 94  --   --  134*  BUN 17 30* 34* 39*  --   --   --  36*  CREATININE 1.00 2.72* 2.66* 2.68*  --   --   --  1.79*  CALCIUM 8.1* 8.3* 8.4* 8.4*  --   --   --  8.4*   GFR: Estimated Creatinine Clearance: 37.5 mL/min (A) (by C-G formula based on SCr of 1.79 mg/dL (H)). Liver Function Tests: Recent Labs  Lab 01/26/17 0752 01/27/17 0616 01/28/17 0514  AST 38 38 49*  ALT 21 20 24   ALKPHOS 145* 102 101  BILITOT 1.7* 1.7* 1.6*  PROT 6.6 7.3 7.8  ALBUMIN 1.9* 2.5* 2.6*   Recent Labs    Lab 01/26/17 0752  LIPASE 35   No results for input(s): AMMONIA in the last 168 hours. Coagulation Profile: Recent Labs  Lab 01/28/17 1345  INR 1.67   Cardiac Enzymes: No results for input(s): CKTOTAL, CKMB, CKMBINDEX, TROPONINI in the last 168 hours. BNP (last 3 results) No results for input(s): PROBNP in the last 8760 hours. HbA1C: No results for input(s): HGBA1C in the last 72 hours. CBG: Recent Labs  Lab 01/28/17 1515 01/28/17 1618  GLUCAP 136* 117*   Lipid Profile: No results for input(s): CHOL, HDL, LDLCALC, TRIG, CHOLHDL, LDLDIRECT in the last 72 hours. Thyroid Function Tests: No results for input(s): TSH, T4TOTAL, FREET4, T3FREE, THYROIDAB in the last 72 hours. Anemia Panel: No results for input(s): VITAMINB12, FOLATE, FERRITIN, TIBC, IRON, RETICCTPCT in the last 72 hours. Urine analysis:    Component Value Date/Time   COLORURINE YELLOW 01/29/2017 0030   APPEARANCEUR CLEAR 01/29/2017 0030   LABSPEC 1.016 01/29/2017 0030   PHURINE 5.0 01/29/2017 0030   GLUCOSEU NEGATIVE 01/29/2017  0030   HGBUR NEGATIVE 01/29/2017 0030   BILIRUBINUR NEGATIVE 01/29/2017 0030   KETONESUR NEGATIVE 01/29/2017 0030   PROTEINUR NEGATIVE 01/29/2017 0030   UROBILINOGEN >=8.0 03/12/2016 1138   NITRITE NEGATIVE 01/29/2017 0030   LEUKOCYTESUR TRACE (A) 01/29/2017 0030   Sepsis Labs: @LABRCNTIP (procalcitonin:4,lacticidven:4)  ) Recent Results (from the past 240 hour(s))  Culture, body fluid-bottle     Status: None (Preliminary result)   Collection Time: 01/26/17 12:01 PM  Result Value Ref Range Status   Specimen Description ASCITIC  Final   Special Requests NONE  Final   Culture   Final    NO GROWTH 3 DAYS Performed at [facility], [address] [place], [place] [address]    Report Status PENDING  Incomplete  Gram stain     Status: None   Collection Time: 01/26/17 12:01 PM  Result Value Ref Range Status   Specimen Description ASCITIC  Final   Special Requests NONE   Final   Gram Stain   Final    MODERATE WBC PRESENT, PREDOMINANTLY MONONUCLEAR NO ORGANISMS SEEN Performed at [facility], [address] [place], [place] [address]    Report Status 01/26/2017 FINAL  Final  Culture, blood (Routine X 2) w Reflex to ID Panel     Status: None (Preliminary result)   Collection Time: 01/26/17  7:31 PM  Result Value Ref Range Status   Specimen Description BLOOD LEFT ANTECUBITAL  Final   Special Requests   Final    BOTTLES DRAWN AEROBIC AND ANAEROBIC Blood Culture adequate volume   Culture   Final    NO GROWTH 2 DAYS Performed at [facility], [address] [place], [place] [address]    Report Status PENDING  Incomplete  Culture, blood (Routine X 2) w Reflex to ID Panel     Status: None (Preliminary result)   Collection Time: 01/26/17  9:53 PM  Result Value Ref Range Status   Specimen Description BLOOD LEFT ARM  Final   Special Requests   Final    BOTTLES DRAWN AEROBIC AND ANAEROBIC Blood Culture adequate volume   Culture   Final    NO GROWTH 2 DAYS Performed at [facility], [address] [place], [place] [address]    Report Status PENDING  Incomplete      Studies: Dg Abd 1 View  Result Date: 01/29/2017 CLINICAL DATA:  Distended abdomen EXAM: ABDOMEN - 1 VIEW COMPARISON:  CT abdomen pelvis 01/26/2017 FINDINGS: Gas in nondilated large and small bowel loops. Possible mild ileus. Note is made of large ascites on the prior CT. Surgical clips in the gallbladder fossa. IMPRESSION: Mild ileus.  Note is made of large ascites on recent CT. Electronically Signed   By: Marlan Palauharles  Clark M.D.   On: 01/29/2017 10:57   [place] Abdomen Limited  Result Date: 01/29/2017 CLINICAL DATA:  Ascites EXAM: LIMITED ABDOMEN ULTRASOUND FOR ASCITES TECHNIQUE: Limited ultrasound survey for ascites was performed in all four abdominal quadrants. COMPARISON:  01/26/2017 FINDINGS: Trace ascites noted on survey interrogation of the peritoneal cavity. There is ascites  in a moderately large umbilical hernia, with internal septations. IMPRESSION: 1. Inadequate intraperitoneal ascites to allow paracentesis, which was therefore deferred. Electronically Signed   By: Corlis Leak  Hassell M.D.   On: 01/29/2017 15:27    Scheduled Meds: . folic acid  1 mg Oral Daily  . ipratropium-albuterol  3 mL Nebulization BID  . lactulose  30 g Oral TID  . midodrine  7.5 mg Oral TID WC  . rifaximin  550 mg Oral BID  . sodium chloride flush  3 mL Intravenous Q12H  . Sofosbuvir-Velpatasvir  1 tablet Oral Daily  . thiamine  100 mg Oral Daily    Continuous Infusions: . sodium chloride    . cefTRIAXone (ROCEPHIN)  IV Stopped (01/29/17 1430)  . octreotide  (SANDOSTATIN)    IV infusion 50 mcg/hr (01/29/17 4098)     LOS: 3 days     Laverna Peace, MD [facility] Pager [phone]  If 7PM-7AM, please contact night-coverage [email or web address] Password [facility] 01/29/2017, 7:19 PM

## 2017-01-30 DIAGNOSIS — K7460 Unspecified cirrhosis of liver: Secondary | ICD-10-CM

## 2017-01-30 DIAGNOSIS — K7290 Hepatic failure, unspecified without coma: Principal | ICD-10-CM

## 2017-01-30 DIAGNOSIS — K429 Umbilical hernia without obstruction or gangrene: Secondary | ICD-10-CM

## 2017-01-30 LAB — BASIC METABOLIC PANEL
Anion gap: 6 (ref 5–15)
BUN: 20 mg/dL (ref 6–20)
CHLORIDE: 106 mmol/L (ref 101–111)
CO2: 22 mmol/L (ref 22–32)
CREATININE: 0.9 mg/dL (ref 0.44–1.00)
Calcium: 8.8 mg/dL — ABNORMAL LOW (ref 8.9–10.3)
GFR calc non Af Amer: >60 mL/min (ref >60)
Glucose, Bld: 179 mg/dL — ABNORMAL HIGH (ref 65–99)
Potassium: 4.2 mmol/L (ref 3.5–5.1)
SODIUM: 134 mmol/L — AB (ref 135–145)

## 2017-01-30 MED ORDER — CEFPODOXIME PROXETIL 200 MG PO TABS
200.0000 mg | ORAL_TABLET | Freq: Two times a day (BID) | ORAL | Status: DC
  Administered 2017-01-30 – 2017-01-31 (×3): 200 mg via ORAL
  Filled 2017-01-30 (×3): qty 1

## 2017-01-30 NOTE — Progress Notes (Signed)
[facility] Progress Note  Subjective: creat down to 0.9, pt feeling better   Vitals:   01/29/17 2039 01/29/17 2111 01/30/17 0407 01/30/17 1338  BP: 129/74  119/78 116/63  Pulse: 84  75 75  Resp: 18  18 12   Temp: 99.1 F (37.3 C)  99.8 F (37.7 C) 98.8 F (37.1 C)  TempSrc: Oral  Oral Oral  SpO2: 100% 99% 100% 100%  Weight:   95.7 kg (211 lb)   Height:        Inpatient medications: . cefpodoxime  200 mg Oral Q12H  . docusate sodium  100 mg Oral BID  . folic acid  1 mg Oral Daily  . lactulose  30 g Oral TID  . midodrine  7.5 mg Oral TID WC  . rifaximin  550 mg Oral BID  . sodium chloride flush  3 mL Intravenous Q12H  . Sofosbuvir-Velpatasvir  1 tablet Oral Daily  . thiamine  100 mg Oral Daily   . sodium chloride    . octreotide  (SANDOSTATIN)    IV infusion 50 mcg/hr (01/30/17 0508)   sodium chloride, albuterol, DULoxetine, ondansetron **OR** ondansetron (ZOFRAN) IV, oxyCODONE, sodium chloride flush, traZODone  Exam: Gen alert, Ox 3 Sclera anicteric, throat clear  No jvd or bruits Chest clear bilat RRR no MRG Abd nontender, liver down 5 cm, 2-3+ ascites, +bs Ext diffuse nonpit LE edema 2+ from ankles to hips Neuro is alert, Ox 3 , nf    Impression: 1  AKI - suspected HRS, resolved; can dc octreotide and then taper off midodrine over 2-3 week period. Have d/w pmd. Will sign off.  2  Hyperkalemia - better,. Cont renal diet 3  Hep C 4  SBP 5  Cirrhosis/ ascites - recommend not greater than 2-3 L paracentesis for this pt 6  Vol +leg edema  Plan - as above   Vinson Moselleob Prabhnoor Ellenberger MD [facility] pager [phone]   01/30/2017, 2:01 PM   Recent Labs  Lab 01/28/17 0514 01/28/17 1622  01/29/17 0025 01/29/17 0431 01/30/17 0512  NA 132*  --   --   --  134* 134*  K 6.1* 4.8   < > 5.0 4.6  4.5 4.2  CL 107  --   --   --  107 106  CO2 20*  --   --   --  17* 22  GLUCOSE 100* 94  --   --  134* 179*  BUN 39*  --   --   --  36* 20   CREATININE 2.68*  --   --   --  1.79* 0.90  CALCIUM 8.4*  --   --   --  8.4* 8.8*   < > = values in this interval not displayed.   Recent Labs  Lab 01/26/17 0752 01/27/17 0616 01/28/17 0514  AST 38 38 49*  ALT 21 20 24   ALKPHOS 145* 102 101  BILITOT 1.7* 1.7* 1.6*  PROT 6.6 7.3 7.8  ALBUMIN 1.9* 2.5* 2.6*   Recent Labs  Lab 01/26/17 0752 01/27/17 0616 01/28/17 0514  WBC 7.2 5.9 7.1  HGB 9.6* 9.4* 9.9*  HCT 29.3* 29.1* 30.6*  MCV 110.2* 111.1* 110.9*  PLT 130* 131* 150   Iron/TIBC/Ferritin/ %Sat No results found for: IRON, TIBC, FERRITIN, IRONPCTSAT

## 2017-01-30 NOTE — Plan of Care (Signed)
  Progressing Health Behavior/Discharge Planning: Ability to manage health-related needs will improve 01/30/2017 1226 - Progressing by Gwendlyn DeutscherStevens, Kenyatta Gloeckner E, RN Clinical Measurements: Ability to maintain clinical measurements within normal limits will improve 01/30/2017 1226 - Progressing by Gwendlyn DeutscherStevens, Jarelis Ehlert E, RN Will remain free from infection 01/30/2017 1226 - Progressing by Gwendlyn DeutscherStevens, Stacey Maura E, RN Diagnostic test results will improve 01/30/2017 1226 - Progressing by Gwendlyn DeutscherStevens, Jowana Thumma E, RN Respiratory complications will improve 01/30/2017 1226 - Progressing by Gwendlyn DeutscherStevens, Mikita Lesmeister E, RN Cardiovascular complication will be avoided 01/30/2017 1226 - Progressing by Gwendlyn DeutscherStevens, Shataya Winkles E, RN Activity: Risk for activity intolerance will decrease 01/30/2017 1226 - Progressing by Gwendlyn DeutscherStevens, Deshara Rossi E, RN Coping: Level of anxiety will decrease 01/30/2017 1226 - Progressing by Gwendlyn DeutscherStevens, Sabino Denning E, RN Pain Managment: General experience of comfort will improve 01/30/2017 1226 - Progressing by Gwendlyn DeutscherStevens, Tyteanna Ost E, RN

## 2017-01-30 NOTE — Progress Notes (Signed)
PROGRESS NOTE  Linda Vaughn ZOX:[id number] DOB: [birth year] DOA: 01/26/2017 PCP: [name], FNP  HPI/Recap of past 24 hours:  Linda Vaughn is a 52 y.o. year old female with medical history significant for cirrhosis, possibly from remote ETOH abuse  + HCV, HTN, abdominal hernia  who presented on 01/26/2017 with abdominal pain, increased abdominal distention and chills for several days and was found to have SBP, AKI, hepatic encephalopathy and acute on chronic worsening abdominal pain secondary to hernia.   Reports improvement in abdominal pain.  Also complains of wheezing however no new cough.  Assessment/Plan: Principal Problem:   SBP (spontaneous bacterial peritonitis) (HCC) Active Problems:   Cholelithiasis   HTN (hypertension)   Tobacco dependence   Umbilical hernia without obstruction and without gangrene   Ascites   Hepatic cirrhosis (HCC)   Chronic hepatitis C without hepatic coma (HCC)   AKI (acute kidney injury) (HCC)  Decompensated cirrhosis secondary to hepatitis C with hepatic encephalopathy and recurrent SBP, stable.  Status post therapeutic/diagnostic paracentesis on 1/21.  Unable to obtain repeat para abdominal fluid on ultrasound.  Abdominal pain greatly improved.  Will transition to Cefpodoxime for oral coverage for SBP.  We will continue lactulose upon discharge per GI recommendations.  Discussed with patient the importance of following up with her GI doctors at [facility].  She has completed IV albumin for both HRS and SBP.   Abdominal pain, improved.  Has maintained normal bowel regimen with lactulose, abdominal x-ray did show ileus over patient reports improvement in belly pain today.  Repeat ultrasound yesterday showed no worsening ascites.  Therefore no need for paracentesis.  Decrease IV fentanyl today.,  Decreased frequency of oxycodone, do not plan on discharging on opioid therapy.  Will consider tramadol for short prescription on discharge.  AKI  with hyperkalemia likely hepatorenal syndrome, resolved.  Per nephrology retardations will discontinue octreotide.  We will plan to taper Midrin for next 2-3 weeks as an outpatient.  Continue 5 mg 3 times daily for 1 week, followed by 2.5 3 times daily for 1 week, followed by discontinuation as an outpatient.  Symptomatic umbilical hernia, likely exacerbated in the setting of ascites, improved No significant ascites on ultrasound on 1/24.  Patient reports improvement in abdominal pain.  Suspect a degree of chronic abdominal pain related to hernia.  Patient agrees  will see her outpatient surgical team at [facility] upon discharge.  Upper respiratory tract wheezing,  Chest x-ray done 1/22 showed mild vascular congestion with bibasilar atelectasis.  Continue supportive care with duo nebs, incentive spirometry, PRN albuterol  Chronic anemia and thrombocytopenia, likely secondary to hepatic pathology as mentioned above, stable.  We will continue to monitor holding off on any anticoagulation prophylactic products.  Continue SCDs.  Code Status: Full Code  Family Communication: No family at bedside  Disposition Plan: Transition to oral antibiotics for SBP, wean pain medication, expect discharge in next 24-48 hours.   Consultants:  Nephrology, GI, Surgery   Procedures:  Ultrasound guided Paracentesis: 01/26/17: 2.2 L yellow fluid removed  Antimicrobials:  Ceftriaxone 1/21>>  Rifaximin 1/21>>  Cultures: Ascitic Culture 01/26/17: NGTD Blood cultures x2 01/26/17: NGTD  DVT prophylaxis: SCDs   Objective: Vitals:   01/29/17 2039 01/29/17 2111 01/30/17 0407 01/30/17 1338  BP: 129/74  119/78 116/63  Pulse: 84  75 75  Resp: 18  18 12   Temp: 99.1 F (37.3 C)  99.8 F (37.7 C) 98.8 F (37.1 C)  TempSrc: Oral  Oral Oral  SpO2: 100% 99% 100% 100%  Weight:   95.7 kg (211 lb)   Height:        Intake/Output Summary (Last 24 hours) at 01/30/2017 1510 Last data filed at 01/30/2017  1438 Gross per 24 hour  Intake 1655.83 ml  Output 500 ml  Net 1155.83 ml   Filed Weights   01/26/17 0736 01/29/17 0450 01/30/17 0407  Weight: 81.6 kg (180 lb) 95.1 kg (209 lb 10.5 oz) 95.7 kg (211 lb)    Exam:  General: Lying in bed, much more talkative and interactive today Cardiovascular: regular rate and rhythm, no murmurs, rubs or gallops,  1+ pitting edema of bilateral lower legs Respiratory: no rales, wheezing, or rhonchi appreciated, no respiratory distress on room air Abdomen: Soft, mildly distended, no rebound tenderness or guarding.  Protruding abdominal hernia (nonreducible) Skin: No Rash Musculoskeletal:No clubbing / cyanosis. Neurologic: Grossly no focal neuro deficit.Mental status AAOx3, no asterixis, speech normal, Psychiatric:Appropriate affect, and pleasant mood  Data Reviewed: CBC: Recent Labs  Lab 01/26/17 0752 01/27/17 0616 01/28/17 0514  WBC 7.2 5.9 7.1  HGB 9.6* 9.4* 9.9*  HCT 29.3* 29.1* 30.6*  MCV 110.2* 111.1* 110.9*  PLT 130* 131* 150   Basic Metabolic Panel: Recent Labs  Lab 01/27/17 0616 01/27/17 1355 01/28/17 0514 01/28/17 1622 01/28/17 2042 01/29/17 0025 01/29/17 0431 01/30/17 0512  NA 132* 132* 132*  --   --   --  134* 134*  K 5.3* 5.4* 6.1* 4.8 5.6* 5.0 4.6  4.5 4.2  CL 107 107 107  --   --   --  107 106  CO2 22 19* 20*  --   --   --  17* 22  GLUCOSE 94 175* 100* 94  --   --  134* 179*  BUN 30* 34* 39*  --   --   --  36* 20  CREATININE 2.72* 2.66* 2.68*  --   --   --  1.79* 0.90  CALCIUM 8.3* 8.4* 8.4*  --   --   --  8.4* 8.8*   GFR: Estimated Creatinine Clearance: 74.9 mL/min (by C-G formula based on SCr of 0.9 mg/dL). Liver Function Tests: Recent Labs  Lab 01/26/17 0752 01/27/17 0616 01/28/17 0514  AST 38 38 49*  ALT 21 20 24   ALKPHOS 145* 102 101  BILITOT 1.7* 1.7* 1.6*  PROT 6.6 7.3 7.8  ALBUMIN 1.9* 2.5* 2.6*   Recent Labs  Lab 01/26/17 0752  LIPASE 35   No results for input(s): AMMONIA in the last 168  hours. Coagulation Profile: Recent Labs  Lab 01/28/17 1345  INR 1.67   Cardiac Enzymes: No results for input(s): CKTOTAL, CKMB, CKMBINDEX, TROPONINI in the last 168 hours. BNP (last 3 results) No results for input(s): PROBNP in the last 8760 hours. HbA1C: No results for input(s): HGBA1C in the last 72 hours. CBG: Recent Labs  Lab 01/28/17 1515 01/28/17 1618  GLUCAP 136* 117*   Lipid Profile: No results for input(s): CHOL, HDL, LDLCALC, TRIG, CHOLHDL, LDLDIRECT in the last 72 hours. Thyroid Function Tests: No results for input(s): TSH, T4TOTAL, FREET4, T3FREE, THYROIDAB in the last 72 hours. Anemia Panel: No results for input(s): VITAMINB12, FOLATE, FERRITIN, TIBC, IRON, RETICCTPCT in the last 72 hours. Urine analysis:    Component Value Date/Time   COLORURINE YELLOW 01/29/2017 0030   APPEARANCEUR CLEAR 01/29/2017 0030   LABSPEC 1.016 01/29/2017 0030   PHURINE 5.0 01/29/2017 0030   GLUCOSEU NEGATIVE 01/29/2017 0030   HGBUR NEGATIVE 01/29/2017 0030  BILIRUBINUR NEGATIVE 01/29/2017 0030   KETONESUR NEGATIVE 01/29/2017 0030   PROTEINUR NEGATIVE 01/29/2017 0030   UROBILINOGEN >=8.0 03/12/2016 1138   NITRITE NEGATIVE 01/29/2017 0030   LEUKOCYTESUR TRACE (A) 01/29/2017 0030   Sepsis Labs: @LABRCNTIP (procalcitonin:4,lacticidven:4)  ) Recent Results (from the past 240 hour(s))  Culture, body fluid-bottle     Status: None (Preliminary result)   Collection Time: 01/26/17 12:01 PM  Result Value Ref Range Status   Specimen Description ASCITIC  Final   Special Requests NONE  Final   Culture   Final    NO GROWTH 4 DAYS Performed at [facility], [address], [place], [place] [address]    Report Status PENDING  Incomplete  Gram stain     Status: None   Collection Time: 01/26/17 12:01 PM  Result Value Ref Range Status   Specimen Description ASCITIC  Final   Special Requests NONE  Final   Gram Stain   Final    MODERATE WBC PRESENT, PREDOMINANTLY MONONUCLEAR NO  ORGANISMS SEEN Performed at [facility], [address], [place], [place] [address]    Report Status 01/26/2017 FINAL  Final  Culture, blood (Routine X 2) w Reflex to ID Panel     Status: None (Preliminary result)   Collection Time: 01/26/17  7:31 PM  Result Value Ref Range Status   Specimen Description BLOOD LEFT ANTECUBITAL  Final   Special Requests   Final    BOTTLES DRAWN AEROBIC AND ANAEROBIC Blood Culture adequate volume   Culture   Final    NO GROWTH 3 DAYS Performed at [facility], [address] [place], [place] [address]    Report Status PENDING  Incomplete  Culture, blood (Routine X 2) w Reflex to ID Panel     Status: None (Preliminary result)   Collection Time: 01/26/17  9:53 PM  Result Value Ref Range Status   Specimen Description BLOOD LEFT ARM  Final   Special Requests   Final    BOTTLES DRAWN AEROBIC AND ANAEROBIC Blood Culture adequate volume   Culture   Final    NO GROWTH 3 DAYS Performed at [facility], [address] [place], [place] [address]    Report Status PENDING  Incomplete      Studies: US Abdomen Limited  Result Date: 01/29/2017 CLINICAL DATA:  Ascites EXAM: LIMITED ABDOMEN ULTRASOUND FOR ASCITES TECHNIQUE: Limited ultrasound survey for ascites was performed in all four abdominal quadrants. COMPARISON:  01/26/2017 FINDINGS: Trace ascites noted on survey interrogation of the peritoneal cavity. There is ascites in a moderately large umbilical hernia, with internal septations. IMPRESSION: 1. Inadequate intraperitoneal ascites to allow paracentesis, which was therefore deferred. Electronically Signed   By: Corlis Leak M.D.   On: 01/29/2017 15:27    Scheduled Meds: . cefpodoxime  200 mg Oral Q12H  . docusate sodium  100 mg Oral BID  . folic acid  1 mg Oral Daily  . lactulose  30 g Oral TID  . midodrine  7.5 mg Oral TID WC  . rifaximin  550 mg Oral BID  . sodium chloride flush  3 mL Intravenous Q12H  . Sofosbuvir-Velpatasvir  1  tablet Oral Daily  . thiamine  100 mg Oral Daily    Continuous Infusions: . sodium chloride       LOS: 4 days     Laverna Peace, MD [facility] Pager [phone]  If 7PM-7AM, please contact night-coverage [email or web address] Password [email or web address] 01/30/2017, 3:10 PM

## 2017-01-30 NOTE — Progress Notes (Signed)
[facility] Progress Note     ASSESSMENT and PLAN:   1. Decompensated cirrhosis with HE and recurrent SBP. Fluid culture negative at day 3.  -paracentesis attempted yesterday but insufficient fluid to take.  -Nearing discharge. To transition to PO antibiotics today. -continue Lactulose upon discharge -continue low sodium diet -She will need to follow up with GI / liver specialist at [facility]. She was dismissed from our practice.    2. Symptomatic umbilical hernia. No bowel incarceration, just fluid in hernia. No surgical intervention planned. She will need to return to [facility] to see Surgery again if desires.   3. AKI, resolving.     [facility] Attending   I have taken an interval history, reviewed the chart and examined the patient. I agree with the Advanced Practitioner's note, impression and recommendations.   She is improved and nearing end of hospitalization.  Would treat SBP x 7 days and then go onto a prophylactic cipro dose adjusted for renal fx - qd if possible   Signing off at this point.  No GI f/u [facility] as dismissed Needs f/u Babptist 1-2 weeks post dc  Iva Boop, MD, [facility] [phone] (pager) 01/30/2017 11:30 AM     Chief Complaint:    Cirrhosis, hernia pain  Subjective: Crying again today.   Objective:  Vital signs in last 24 hours: Temp:  [99.1 F (37.3 C)-99.8 F (37.7 C)] 99.8 F (37.7 C) (01/25 0407) Pulse Rate:  [58-84] 75 (01/25 0407) Resp:  [16-18] 18 (01/25 0407) BP: (119-129)/(67-78) 119/78 (01/25 0407) SpO2:  [96 %-100 %] 100 % (01/25 0407) Weight:  [211 lb (95.7 kg)] 211 lb (95.7 kg) (01/25 0407) Last BM Date: 01/30/17 General:   Alert, well-developed, black female in NAD EENT:  Normal hearing, non icteric sclera, conjunctive pink.  Heart:  Regular rate and rhythm; no murmurs. Pulm: Normal respiratory effort Abdomen:  Soft, nondistended, nontender.  Normal bowel sounds, no  masses felt. No hepatomegaly.    Neurologic:  Alert and  oriented x4;  grossly normal neurologically. Psych:  Pleasant, cooperative.  Normal mood and affect.   Intake/Output from previous day: 01/24 0701 - 01/25 0700 In: 2734.2 [P.O.:1040; I.V.:594.2; IV Piggyback:1100] Out: 500 [Urine:500] Intake/Output this shift: Total I/O In: 240 [P.O.:240] Out: -   Lab Results: Recent Labs    01/28/17 0514  WBC 7.1  HGB 9.9*  HCT 30.6*  PLT 150   BMET Recent Labs    01/28/17 0514 01/28/17 1622  01/29/17 0025 01/29/17 0431 01/30/17 0512  NA 132*  --   --   --  134* 134*  K 6.1* 4.8   < > 5.0 4.6  4.5 4.2  CL 107  --   --   --  107 106  CO2 20*  --   --   --  17* 22  GLUCOSE 100* 94  --   --  134* 179*  BUN 39*  --   --   --  36* 20  CREATININE 2.68*  --   --   --  1.79* 0.90  CALCIUM 8.4*  --   --   --  8.4* 8.8*   < > = values in this interval not displayed.   LFT Recent Labs    01/28/17 0514  PROT 7.8  ALBUMIN 2.6*  AST 49*  ALT 24  ALKPHOS 101  BILITOT 1.6*   PT/INR Recent Labs    01/28/17 1345  LABPROT 19.5*  INR 1.67     Principal Problem:   SBP (spontaneous bacterial peritonitis) (HCC) Active Problems:   Cholelithiasis   HTN (hypertension)   Tobacco dependence   Umbilical hernia without obstruction and without gangrene   Ascites   Hepatic cirrhosis (HCC)   Chronic hepatitis C without hepatic coma (HCC)   AKI (acute kidney injury) (HCC)     LOS: 4 days   Willette ClusterPaula Guenther ,NP 01/30/2017, 9:55 AM  Pager number [phone]

## 2017-01-30 NOTE — Care Management Note (Signed)
Case Management Note  Patient Details  Name: Linda Vaughn MRN: [id number] Date of Birth: 1965-05-22  Subjective/Objective: From home, has pcp,pharmacy. No CM needs.                   Action/Plan:d/c plan home.   Expected Discharge Date:  (unknown)               Expected Discharge Plan:  Home/Self Care  In-House Referral:     Discharge planning Services  CM Consult  Post Acute Care Choice:    Choice offered to:     DME Arranged:    DME Agency:     HH Arranged:    HH Agency:     Status of Service:  In process, will continue to follow  If discussed at Long Length of Stay Meetings, dates discussed:    Additional Comments:  Lanier ClamMahabir, Artyom Stencel, RN 01/30/2017, 11:30 AM

## 2017-01-31 LAB — BASIC METABOLIC PANEL
Anion gap: 3 — ABNORMAL LOW (ref 5–15)
BUN: 13 mg/dL (ref 6–20)
CO2: 26 mmol/L (ref 22–32)
CREATININE: 0.7 mg/dL (ref 0.44–1.00)
Calcium: 8.6 mg/dL — ABNORMAL LOW (ref 8.9–10.3)
Chloride: 107 mmol/L (ref 101–111)
GFR calc Af Amer: >60 mL/min (ref >60)
GFR calc non Af Amer: >60 mL/min (ref >60)
Glucose, Bld: 99 mg/dL (ref 65–99)
POTASSIUM: 4.3 mmol/L (ref 3.5–5.1)
Sodium: 136 mmol/L (ref 135–145)

## 2017-01-31 LAB — CULTURE, BODY FLUID W GRAM STAIN -BOTTLE: Culture: NO GROWTH

## 2017-01-31 LAB — CULTURE, BODY FLUID-BOTTLE

## 2017-01-31 MED ORDER — CEFPODOXIME PROXETIL 200 MG PO TABS: 200.0000 mg | ORAL_TABLET | Freq: Two times a day (BID) | ORAL | 0 refills | Status: AC

## 2017-01-31 MED ORDER — MIDODRINE HCL 2.5 MG PO TABS: ORAL_TABLET | ORAL | 0 refills | Status: DC

## 2017-01-31 MED ORDER — FOLIC ACID 1 MG PO TABS: 1.0000 mg | ORAL_TABLET | Freq: Every day | ORAL | 0 refills | Status: DC

## 2017-01-31 MED ORDER — THIAMINE HCL 100 MG PO TABS: 100.0000 mg | ORAL_TABLET | Freq: Every day | ORAL | 0 refills | Status: DC

## 2017-01-31 MED ORDER — LACTULOSE 10 GM/15ML PO SOLN: ORAL | 1 refills | Status: DC

## 2017-01-31 MED ORDER — TRAMADOL HCL 50 MG PO TABS: 50.0000 mg | ORAL_TABLET | Freq: Four times a day (QID) | ORAL | 0 refills | Status: DC | PRN

## 2017-01-31 NOTE — Discharge Summary (Signed)
Discharge Summary  Linda Vaughn EAV:[id number] DOB: 1965-01-15  PCP: Massie MaroonHollis, Lachina M, FNP  Admit date: 01/26/2017 Discharge date: 01/31/2017  Time spent: < 25 minutes  Admitted From: home Disposition: home  Recommendations for Outpatient Follow-up:  1. Follow up with PCP in 1-2 weeks 2. Please obtain BMP, monitor Cr 3. Patient will arrange f/u with [facility]) and general surgery([facility]) 4. Midodrine taper 5 mg x 7 days, 2.5 mg x 7 days, then discontinue  Home Health:NO Equipment/Devices:None  Discharge Diagnoses:  Active Hospital Problems   Diagnosis Date Noted  . SBP (spontaneous bacterial peritonitis) (HCC)   . AKI (acute kidney injury) (HCC) 01/29/2017  . Hepatic cirrhosis (HCC)   . Chronic hepatitis C without hepatic coma (HCC)   . Ascites 05/24/2015  . Tobacco dependence 04/04/2015  . Umbilical hernia without obstruction and without gangrene 04/04/2015  . Cholelithiasis 04/20/2012  . HTN (hypertension) 04/20/2012    Resolved Hospital Problems  No resolved problems to display.    Discharge Condition:Stable CODE STATUS:FULL Diet recommendation: Heart Healthy / Carb Modified / Regular / Dysphagia   Vitals:   01/30/17 2234 01/31/17 0455  BP: 124/70 124/66  Pulse: 68 69  Resp: 18 19  Temp: 98.3 F (36.8 C) 99.2 F (37.3 C)  SpO2: 99% 100%    History of present illness:  Linda DarnerJarryl D Rodino is a [age] y.o. year old female with medical history significant forcirrhosis, possibly from remote ETOH abuse + HCV, HTN, abdominal hernia  who presented on 01/26/2017 with abdominal pain, increased abdominal distention and chillsfor several days and was found to have SBP, AKI, hepatic encephalopathy and acute on chronic worsening abdominal pain secondary to hernia.    Hospital Course:  Principal Problem:   SBP (spontaneous bacterial peritonitis) (HCC) Active Problems:   Cholelithiasis   HTN (hypertension)   Tobacco dependence   Umbilical hernia without obstruction and  without gangrene   Ascites   Hepatic cirrhosis (HCC)   Chronic hepatitis C without hepatic coma (HCC)   AKI (acute kidney injury) (HCC)   Decompensated cirrhosis secondary to hepatitis C with hepatic encephalopathy and recurrent SBP Patient underwent therapeutic/diagnostic paracentesis on 1/21 was found to have greater than 2000 WBCs (73% neutrophils).  No organisms were found on Gram stain or ascites culture.  Patient was given IV ceftriaxone and transition to Vantin to complete a total 9-day course of antibiotics for recurrent SBP.  Patient received albumin for SBP as well.  GI followed and recommended rifaximin in addition to lactulose which resolved hepatic encephalopathy.  Patient was discharged on continued dose of lactulose.  Patient's spironolactone was continued on discharge.  Patient [facility].  Acute on chronic abdominal pain, secondary to ascites fluid and nonreducibleparaumbilical hernia Surgery was consulted due to concern for symptomatic umbilical hernia and imaging a CT abdomen suggesting some complexity hernia.  Surgery review CT scan which did not show any bowel present in the hernia.  Patient was followed throughout hospitalization by surgeons who agreed with no surgical intervention at this time given high risk of perioperative morbidity secondary to her underlying liver failure.  Patient states she will [facility].  Patient's abdominal pain improved significantly with paracentesis, continue supportive care.  Abdominal x-ray was obtained the day prior to discharge which showed some ileus; however, patient continued to have normal bowel movements.  Additionally repeat ultrasound was obtained which showed improvement in ascites and no need for repeat paracentesis.  Discharged with short scrip (12 tabs) for tramadol for pain control.  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  With nephrology recommendations patient was started on IV albumin, octreotide, Midodrine for HRS treatment.  Several days prior to discharge.  On discharge creatinine 0.70.  Creatinine returned to baseline .  Patient will continue slow taper of midodrine (as mentioned above) over next 2-3 weeks patient to continue to avoid nephrotoxins.  Received Kayexalate, insulin one-time hyperkalemia.  That resolved throughout hospitalization.   Consultations: GI, Nephrology, Surgery  Procedures/Studies: Ct Abdomen Pelvis Wo Contrast  Result Date: 01/26/2017 CLINICAL DATA:  Abdominal pain and swelling with increasing size of known umbilical hernia EXAM: CT ABDOMEN AND PELVIS WITHOUT CONTRAST TECHNIQUE: Multidetector CT imaging of the abdomen and pelvis was performed following the standard protocol without IV contrast. COMPARISON:  11/24/2016 FINDINGS: Lower chest: Stable right-sided pleural effusion is again noted. Hepatobiliary: Cirrhotic change of the liver is seen with significant nodularity. The gallbladder has been surgically removed. No focal hepatic mass is noted. Pancreas: Unremarkable. No pancreatic ductal dilatation or surrounding inflammatory changes. Spleen: Normal in size without focal abnormality. Adrenals/Urinary Tract: Adrenal glands are within normal limits. Kidneys are well visualized bilaterally. No renal calculi or obstructive changes are seen. The bladder is partially distended. Stomach/Bowel: Scattered diverticular changes noted without evidence of diverticulitis. No obstructive changes are seen. The appendix is not well appreciated. Vascular/Lymphatic: Aortic atherosclerosis. No enlarged abdominal or pelvic lymph nodes. Reproductive: Uterus and bilateral adnexa are unremarkable. Other: Moderate ascites is noted. Anterior paraumbilical hernia is seen complex in nature. This is increased in size in the transverse dimension to 11.1 cm. The more inferior component has  increased slightly now measuring 7.1 cm in transverse dimension. Mild changes of anasarca are again identified. Musculoskeletal: Degenerative changes of the spine are noted. No acute bony abnormality is seen. IMPRESSION: Hepatic cirrhosis with associated changes of anasarca and ascites. The amount of ascites has increased in the interval from the prior exam. There is also been increase in the size of a complex paraumbilical hernia Stable moderate-sized right pleural effusion. No other acute abnormality is noted. Electronically Signed   By: Alcide Clever M.D.   On: 01/26/2017 09:28   Dg Abd 1 View  Result Date: 01/29/2017 CLINICAL DATA:  Distended abdomen EXAM: ABDOMEN - 1 VIEW COMPARISON:  CT abdomen pelvis 01/26/2017 FINDINGS: Gas in nondilated large and small bowel loops. Possible mild ileus. Note is made of large ascites on the prior CT. Surgical clips in the gallbladder fossa. IMPRESSION: Mild ileus.  Note is made of large ascites on recent CT. Electronically Signed   By: Marlan Palau M.D.   On: 01/29/2017 10:57   US Abdomen Limited  Result Date: 01/29/2017 CLINICAL DATA:  Ascites EXAM: LIMITED ABDOMEN ULTRASOUND FOR ASCITES TECHNIQUE: Limited ultrasound survey for ascites was performed in all four abdominal quadrants. COMPARISON:  01/26/2017 FINDINGS: Trace ascites noted on survey interrogation of the peritoneal cavity. There is ascites in a moderately large umbilical hernia, with internal septations. IMPRESSION: 1. Inadequate intraperitoneal ascites to allow paracentesis, which was therefore deferred. Electronically Signed   By: Corlis Leak M.D.   On: 01/29/2017 15:27   US Paracentesis  Result Date: 01/26/2017 INDICATION: Cirrhosis, hepatitis-C, ascites. Request made for diagnostic and therapeutic paracentesis. EXAM: ULTRASOUND GUIDED DIAGNOSTIC AND THERAPEUTIC PARACENTESIS MEDICATIONS: None. COMPLICATIONS: None immediate. PROCEDURE: Informed written consent was obtained from the patient after a  discussion of the risks, benefits and alternatives to treatment. A  timeout was performed prior to the initiation of the procedure. Initial ultrasound scanning demonstrates a small to moderate amount of ascites within the right upper to mid abdominal quadrant. The right upper to mid abdomen was prepped and draped in the usual sterile fashion. 1% lidocaine was used for local anesthesia. Following this, a Yueh catheter was introduced. An ultrasound image was saved for documentation purposes. The paracentesis was performed. The catheter was removed and a dressing was applied. The patient tolerated the procedure well without immediate post procedural complication. FINDINGS: A total of approximately 2.2 liters of slightly hazy, yellow fluid was removed. Samples were sent to the laboratory as requested by the clinical team. IMPRESSION: Successful ultrasound-guided diagnostic and therapeutic paracentesis yielding 2.2 liters of peritoneal fluid. Read by: Jeananne Rama, PA-C Electronically Signed   By: Malachy Moan M.D.   On: 01/26/2017 12:27   Dg Chest Port 1 View  Result Date: 01/27/2017 CLINICAL DATA:  Severe shortness of breath, cough, congestion EXAM: PORTABLE CHEST 1 VIEW COMPARISON:  05/25/2015 FINDINGS: Low lung volumes. Heart size is borderline, accentuated by the AP portable nature of the study and the low volumes. Mild vascular congestion and bibasilar opacities, likely atelectasis. IMPRESSION: Low lung volumes. Mild vascular congestion and bibasilar atelectasis. Electronically Signed   By: Charlett Nose M.D.   On: 01/27/2017 11:38    (Echo, Carotid, EGD, Colonoscopy, ERCP)   Discharge Exam: BP 124/66 (BP Location: Left Arm)   Pulse 69   Temp 99.2 F (37.3 C) (Oral)   Resp 19   Ht 5' (1.524 m)   Wt 93.9 kg (207 lb 0.2 oz)   SpO2 100%   BMI 40.43 kg/m   General: Lying in bed, in no acute distress, pleasant in conversation Cardiovascular: Regular rate and rhythm, no appreciable murmurs rubs  or gallops. Respiratory: Clear lung sounds bilaterally, no wheezing, no rales, on room air Abdomen: Soft, nondistended, normal bowel sounds, protruding hernia (nonreducible, chronic)  Discharge Instructions You were cared for by a hospitalist during your hospital stay. If you have any questions about your discharge medications or the care you received while you were in the hospital after you are discharged, you can call the unit and asked to speak with the hospitalist on call if the hospitalist that took care of you is not available. Once you are discharged, your primary care physician will handle any further medical issues. Please note that NO REFILLS for any discharge medications will be authorized once you are discharged, as it is imperative that you return to your primary care physician (or establish a relationship with a primary care physician if you do not have one) for your aftercare needs so that they can reassess your need for medications and monitor your lab values.  Discharge Instructions    Diet - low sodium heart healthy   Complete by:  As directed    Increase activity slowly   Complete by:  As directed      Allergies as of 01/31/2017      Reactions   Aspirin Other (See Comments)   Liver damage    Ibuprofen Other (See Comments)   Liver damage   Tylenol [acetaminophen] Other (See Comments)   Liver damage      Medication List    STOP taking these medications   docusate sodium 100 MG capsule Commonly known as:  COLACE   oxyCODONE 5 MG immediate release tablet Commonly known as:  Oxy IR/ROXICODONE     TAKE these medications  carvedilol 6.25 MG tablet Commonly known as:  COREG Take 6.25 mg by mouth 2 (two) times daily.   cefpodoxime 200 MG tablet Commonly known as:  VANTIN Take 1 tablet (200 mg total) by mouth every 12 (twelve) hours for 4 days.   DULoxetine 20 MG capsule Commonly known as:  CYMBALTA Take 20 mg by mouth daily as needed (for mood).   folic acid 1  MG tablet Commonly known as:  FOLVITE Take 1 tablet (1 mg total) by mouth daily.   lactulose 10 GM/15ML solution Commonly known as:  CHRONULAC TAKE 15 MLS TWICE A DAY   midodrine 2.5 MG tablet Commonly known as:  PROAMATINE Take 2 tabs three times a day for seven days (1/26-2/1) Take 1 tab three times a day for seven days (2/2-2/8)   ribavirin 200 MG capsule Commonly known as:  REBETOL Take 400-600 mg by mouth See admin instructions. 600 mg in the morning and 400 mg in the late afternoon   Sofosbuvir-Velpatasvir 400-100 MG Tabs Take 1 tablet daily by mouth.   spironolactone 100 MG tablet Commonly known as:  ALDACTONE TAKE 1 TABLET (100 MG TOTAL) BY MOUTH DAILY.   thiamine 100 MG tablet Take 1 tablet (100 mg total) by mouth daily.   traMADol 50 MG tablet Commonly known as:  ULTRAM Take 1 tablet (50 mg total) by mouth every 6 (six) hours as needed.      Allergies  Allergen Reactions  . Aspirin Other (See Comments)    Liver damage   . Ibuprofen Other (See Comments)    Liver damage  . Tylenol [Acetaminophen] Other (See Comments)    Liver damage      The results of significant diagnostics from this hospitalization (including imaging, microbiology, ancillary and laboratory) are listed below for reference.    Significant Diagnostic Studies: Ct Abdomen Pelvis Wo Contrast  Result Date: 01/26/2017 CLINICAL DATA:  Abdominal pain and swelling with increasing size of known umbilical hernia EXAM: CT ABDOMEN AND PELVIS WITHOUT CONTRAST TECHNIQUE: Multidetector CT imaging of the abdomen and pelvis was performed following the standard protocol without IV contrast. COMPARISON:  11/24/2016 FINDINGS: Lower chest: Stable right-sided pleural effusion is again noted. Hepatobiliary: Cirrhotic change of the liver is seen with significant nodularity. The gallbladder has been surgically removed. No focal hepatic mass is noted. Pancreas: Unremarkable. No pancreatic ductal dilatation or  surrounding inflammatory changes. Spleen: Normal in size without focal abnormality. Adrenals/Urinary Tract: Adrenal glands are within normal limits. Kidneys are well visualized bilaterally. No renal calculi or obstructive changes are seen. The bladder is partially distended. Stomach/Bowel: Scattered diverticular changes noted without evidence of diverticulitis. No obstructive changes are seen. The appendix is not well appreciated. Vascular/Lymphatic: Aortic atherosclerosis. No enlarged abdominal or pelvic lymph nodes. Reproductive: Uterus and bilateral adnexa are unremarkable. Other: Moderate ascites is noted. Anterior paraumbilical hernia is seen complex in nature. This is increased in size in the transverse dimension to 11.1 cm. The more inferior component has increased slightly now measuring 7.1 cm in transverse dimension. Mild changes of anasarca are again identified. Musculoskeletal: Degenerative changes of the spine are noted. No acute bony abnormality is seen. IMPRESSION: Hepatic cirrhosis with associated changes of anasarca and ascites. The amount of ascites has increased in the interval from the prior exam. There is also been increase in the size of a complex paraumbilical hernia Stable moderate-sized right pleural effusion. No other acute abnormality is noted. Electronically Signed   By: Alcide Clever M.D.   On: 01/26/2017 09:28  Dg Abd 1 View  Result Date: 01/29/2017 CLINICAL DATA:  Distended abdomen EXAM: ABDOMEN - 1 VIEW COMPARISON:  CT abdomen pelvis 01/26/2017 FINDINGS: Gas in nondilated large and small bowel loops. Possible mild ileus. Note is made of large ascites on the prior CT. Surgical clips in the gallbladder fossa. IMPRESSION: Mild ileus.  Note is made of large ascites on recent CT. Electronically Signed   By: Marlan Palau M.D.   On: 01/29/2017 10:57   US Abdomen Limited  Result Date: 01/29/2017 CLINICAL DATA:  Ascites EXAM: LIMITED ABDOMEN ULTRASOUND FOR ASCITES TECHNIQUE: Limited  ultrasound survey for ascites was performed in all four abdominal quadrants. COMPARISON:  01/26/2017 FINDINGS: Trace ascites noted on survey interrogation of the peritoneal cavity. There is ascites in a moderately large umbilical hernia, with internal septations. IMPRESSION: 1. Inadequate intraperitoneal ascites to allow paracentesis, which was therefore deferred. Electronically Signed   By: Corlis Leak M.D.   On: 01/29/2017 15:27   US Paracentesis  Result Date: 01/26/2017 INDICATION: Cirrhosis, hepatitis-C, ascites. Request made for diagnostic and therapeutic paracentesis. EXAM: ULTRASOUND GUIDED DIAGNOSTIC AND THERAPEUTIC PARACENTESIS MEDICATIONS: None. COMPLICATIONS: None immediate. PROCEDURE: Informed written consent was obtained from the patient after a discussion of the risks, benefits and alternatives to treatment. A timeout was performed prior to the initiation of the procedure. Initial ultrasound scanning demonstrates a small to moderate amount of ascites within the right upper to mid abdominal quadrant. The right upper to mid abdomen was prepped and draped in the usual sterile fashion. 1% lidocaine was used for local anesthesia. Following this, a Yueh catheter was introduced. An ultrasound image was saved for documentation purposes. The paracentesis was performed. The catheter was removed and a dressing was applied. The patient tolerated the procedure well without immediate post procedural complication. FINDINGS: A total of approximately 2.2 liters of slightly hazy, yellow fluid was removed. Samples were sent to the laboratory as requested by the clinical team. IMPRESSION: Successful ultrasound-guided diagnostic and therapeutic paracentesis yielding 2.2 liters of peritoneal fluid. Read by: Jeananne Rama, PA-C Electronically Signed   By: Malachy Moan M.D.   On: 01/26/2017 12:27   Dg Chest Port 1 View  Result Date: 01/27/2017 CLINICAL DATA:  Severe shortness of breath, cough, congestion EXAM:  PORTABLE CHEST 1 VIEW COMPARISON:  05/25/2015 FINDINGS: Low lung volumes. Heart size is borderline, accentuated by the AP portable nature of the study and the low volumes. Mild vascular congestion and bibasilar opacities, likely atelectasis. IMPRESSION: Low lung volumes. Mild vascular congestion and bibasilar atelectasis. Electronically Signed   By: Charlett Nose M.D.   On: 01/27/2017 11:38    Microbiology: Recent Results (from the past 240 hour(s))  Culture, body fluid-bottle     Status: None (Preliminary result)   Collection Time: 01/26/17 12:01 PM  Result Value Ref Range Status   Specimen Description ASCITIC  Final   Special Requests NONE  Final   Culture   Final    NO GROWTH 4 DAYS Performed at [facility], [address], [place], [place] [address]    Report Status PENDING  Incomplete  Gram stain     Status: None   Collection Time: 01/26/17 12:01 PM  Result Value Ref Range Status   Specimen Description ASCITIC  Final   Special Requests NONE  Final   Gram Stain   Final    MODERATE WBC PRESENT, PREDOMINANTLY MONONUCLEAR NO ORGANISMS SEEN Performed at [facility], [address], [place], [place] [address]  Report Status 01/26/2017 FINAL  Final  Culture, blood (Routine X 2) w Reflex to ID Panel     Status: None (Preliminary result)   Collection Time: 01/26/17  7:31 PM  Result Value Ref Range Status   Specimen Description BLOOD LEFT ANTECUBITAL  Final   Special Requests   Final    BOTTLES DRAWN AEROBIC AND ANAEROBIC Blood Culture adequate volume   Culture   Final    NO GROWTH 3 DAYS Performed at [facility], [address], [place], [place] [address]    Report Status PENDING  Incomplete  Culture, blood (Routine X 2) w Reflex to ID Panel     Status: None (Preliminary result)   Collection Time: 01/26/17  9:53 PM  Result Value Ref Range Status   Specimen Description BLOOD LEFT ARM  Final   Special Requests   Final    BOTTLES DRAWN AEROBIC AND ANAEROBIC  Blood Culture adequate volume   Culture   Final    NO GROWTH 3 DAYS Performed at [facility], [address], [place], [place] [address]    Report Status PENDING  Incomplete     Labs: Basic Metabolic Panel: Recent Labs  Lab 01/27/17 1355 01/28/17 0514 01/28/17 1622 01/28/17 2042 01/29/17 0025 01/29/17 0431 01/30/17 0512 01/31/17 0518  NA 132* 132*  --   --   --  134* 134* 136  K 5.4* 6.1* 4.8 5.6* 5.0 4.6  4.5 4.2 4.3  CL 107 107  --   --   --  107 106 107  CO2 19* 20*  --   --   --  17* 22 26  GLUCOSE 175* 100* 94  --   --  134* 179* 99  BUN 34* 39*  --   --   --  36* 20 13  CREATININE 2.66* 2.68*  --   --   --  1.79* 0.90 0.70  CALCIUM 8.4* 8.4*  --   --   --  8.4* 8.8* 8.6*   Liver Function Tests: Recent Labs  Lab 01/26/17 0752 01/27/17 0616 01/28/17 0514  AST 38 38 49*  ALT 21 20 24   ALKPHOS 145* 102 101  BILITOT 1.7* 1.7* 1.6*  PROT 6.6 7.3 7.8  ALBUMIN 1.9* 2.5* 2.6*   Recent Labs  Lab 01/26/17 0752  LIPASE 35   No results for input(s): AMMONIA in the last 168 hours. CBC: Recent Labs  Lab 01/26/17 0752 01/27/17 0616 01/28/17 0514  WBC 7.2 5.9 7.1  HGB 9.6* 9.4* 9.9*  HCT 29.3* 29.1* 30.6*  MCV 110.2* 111.1* 110.9*  PLT 130* 131* 150   Cardiac Enzymes: No results for input(s): CKTOTAL, CKMB, CKMBINDEX, TROPONINI in the last 168 hours. BNP: BNP (last 3 results) No results for input(s): BNP in the last 8760 hours.  ProBNP (last 3 results) No results for input(s): PROBNP in the last 8760 hours.  CBG: Recent Labs  Lab 01/28/17 1515 01/28/17 1618  GLUCAP 136* 117*       Signed:  Laverna Peace, MD [facility] 01/31/2017, 9:37 AM

## 2017-01-31 NOTE — Plan of Care (Signed)
  Adequate for Discharge Health Behavior/Discharge Planning: Ability to manage health-related needs will improve 01/31/2017 1002 - Adequate for Discharge by Gwendlyn DeutscherStevens, Edvin Albus E, RN Clinical Measurements: Ability to maintain clinical measurements within normal limits will improve 01/31/2017 1002 - Adequate for Discharge by Gwendlyn DeutscherStevens, Wardell Pokorski E, RN Will remain free from infection 01/31/2017 1002 - Adequate for Discharge by Gwendlyn DeutscherStevens, Bowen Kia E, RN Diagnostic test results will improve 01/31/2017 1002 - Adequate for Discharge by Gwendlyn DeutscherStevens, Markhi Kleckner E, RN Respiratory complications will improve 01/31/2017 1002 - Adequate for Discharge by Gwendlyn DeutscherStevens, Krystelle Prashad E, RN Cardiovascular complication will be avoided 01/31/2017 1002 - Adequate for Discharge by Gwendlyn DeutscherStevens, Deontrae Drinkard E, RN Activity: Risk for activity intolerance will decrease 01/31/2017 1002 - Adequate for Discharge by Gwendlyn DeutscherStevens, Dhairya Corales E, RN Coping: Level of anxiety will decrease 01/31/2017 1002 - Adequate for Discharge by Gwendlyn DeutscherStevens, Aparna Vanderweele E, RN Pain Managment: General experience of comfort will improve 01/31/2017 1002 - Adequate for Discharge by Gwendlyn DeutscherStevens, Brylin Stopper E, RN

## 2017-01-31 NOTE — Progress Notes (Signed)
D/C instructions reviewed w/ pt in detail using teach back. Pt verbalizes understanding and all questions answered. Pt in stable condition, in possession of d/c packet, scripts, and all personal belongings. This Clinical research associatewriter has spoken to pt's sister who says she will be here within the hour to pick up pt. Sister also states she will take pt to pharmacy to p/u scripts.

## 2017-01-31 NOTE — Discharge Instructions (Signed)
Paracentesis, Care After °Refer to this sheet in the next few weeks. These instructions provide you with information about caring for yourself after your procedure. Your health care provider may also give you more specific instructions. Your treatment has been planned according to current medical practices, but problems sometimes occur. Call your health care provider if you have any problems or questions after your procedure. °What can I expect after the procedure? °After your procedure, it is common to have a small amount of clear fluid coming from the puncture site. °Follow these instructions at home: °· Return to your normal activities as told by your health care provider. Ask your health care provider what activities are safe for you. °· Take over-the-counter and prescription medicines only as told by your health care provider. °· Do not take baths, swim, or use a hot tub until your health care provider approves. °· Follow instructions from your health care provider about: °? How to take care of your puncture site. °? When and how you should change your bandage (dressing). °? When you should remove your dressing. °· Check your puncture area every day signs of infection. Watch for: °? Redness, swelling, or pain. °? Fluid, blood, or pus. °· Keep all follow-up visits as told by your health care provider. This is important. °Contact a health care provider if: °· You have redness, swelling, or pain at your puncture site. °· You start to have more clear fluid coming from your puncture site. °· You have blood or pus coming from your puncture site. °· You have chills. °· You have a fever. °Get help right away if: °· You develop chest pain or shortness of breath. °· You develop increasing pain, discomfort, or swelling in your abdomen. °· You feel dizzy or light-headed or you pass out. °This information is not intended to replace advice given to you by your health care provider. Make sure you discuss any questions you  have with your health care provider. °Document Released: 05/09/2014 Document Revised: 05/31/2015 Document Reviewed: 03/07/2014 °Elsevier Interactive Patient Education © 2018 [employer] ° °

## 2017-02-01 LAB — CULTURE, BLOOD (ROUTINE X 2)
CULTURE: NO GROWTH
Culture: NO GROWTH
Special Requests: ADEQUATE
Special Requests: ADEQUATE

## 2017-02-04 ENCOUNTER — Other Ambulatory Visit: Payer: Self-pay | Admitting: Family Medicine

## 2017-02-06 ENCOUNTER — Ambulatory Visit: Payer: Medicaid Other | Admitting: Family Medicine

## 2017-02-11 ENCOUNTER — Emergency Department (HOSPITAL_COMMUNITY): Payer: Medicaid Other

## 2017-02-11 ENCOUNTER — Encounter (HOSPITAL_COMMUNITY): Payer: Self-pay | Admitting: Radiology

## 2017-02-11 ENCOUNTER — Emergency Department (HOSPITAL_COMMUNITY)
Admission: EM | Admit: 2017-02-11 | Discharge: 2017-02-11 | Disposition: A | Discharge status: Home / Self Care | Payer: Medicaid Other | Attending: Emergency Medicine | Admitting: Emergency Medicine

## 2017-02-11 DIAGNOSIS — S52125A Nondisplaced fracture of head of left radius, initial encounter for closed fracture: Secondary | ICD-10-CM | POA: Diagnosis not present

## 2017-02-11 DIAGNOSIS — Y999 Unspecified external cause status: Secondary | ICD-10-CM | POA: Insufficient documentation

## 2017-02-11 DIAGNOSIS — W010XXA Fall on same level from slipping, tripping and stumbling without subsequent striking against object, initial encounter: Secondary | ICD-10-CM | POA: Diagnosis not present

## 2017-02-11 DIAGNOSIS — Y92480 Sidewalk as the place of occurrence of the external cause: Secondary | ICD-10-CM | POA: Insufficient documentation

## 2017-02-11 DIAGNOSIS — I10 Essential (primary) hypertension: Secondary | ICD-10-CM | POA: Diagnosis not present

## 2017-02-11 DIAGNOSIS — W19XXXA Unspecified fall, initial encounter: Secondary | ICD-10-CM

## 2017-02-11 DIAGNOSIS — F17210 Nicotine dependence, cigarettes, uncomplicated: Secondary | ICD-10-CM | POA: Insufficient documentation

## 2017-02-11 DIAGNOSIS — Y939 Activity, unspecified: Secondary | ICD-10-CM | POA: Diagnosis not present

## 2017-02-11 DIAGNOSIS — S4992XA Unspecified injury of left shoulder and upper arm, initial encounter: Secondary | ICD-10-CM | POA: Diagnosis present

## 2017-02-11 DIAGNOSIS — K429 Umbilical hernia without obstruction or gangrene: Secondary | ICD-10-CM | POA: Diagnosis not present

## 2017-02-11 DIAGNOSIS — Z79899 Other long term (current) drug therapy: Secondary | ICD-10-CM | POA: Diagnosis not present

## 2017-02-11 LAB — I-STAT CHEM 8, ED
BUN: 6 mg/dL (ref 6–20)
CALCIUM ION: 1.15 mmol/L (ref 1.15–1.40)
CHLORIDE: 102 mmol/L (ref 101–111)
Creatinine, Ser: 0.7 mg/dL (ref 0.44–1.00)
Glucose, Bld: 90 mg/dL (ref 65–99)
HCT: 35 % — ABNORMAL LOW (ref 36.0–46.0)
Hemoglobin: 11.9 g/dL — ABNORMAL LOW (ref 12.0–15.0)
Potassium: 3.6 mmol/L (ref 3.5–5.1)
SODIUM: 142 mmol/L (ref 135–145)
TCO2: 27 mmol/L (ref 22–32)

## 2017-02-11 MED ORDER — HYDROMORPHONE HCL 1 MG/ML IJ SOLN
0.5000 mg | Freq: Once | INTRAMUSCULAR | Status: AC
  Administered 2017-02-11: 0.5 mg via INTRAVENOUS
  Filled 2017-02-11: qty 1

## 2017-02-11 MED ORDER — SODIUM CHLORIDE 0.9 % IJ SOLN
INTRAMUSCULAR | Status: AC
  Filled 2017-02-11: qty 50

## 2017-02-11 MED ORDER — IOPAMIDOL (ISOVUE-300) INJECTION 61%
INTRAVENOUS | Status: AC
  Administered 2017-02-11: 100 mL via INTRAVENOUS
  Filled 2017-02-11: qty 100

## 2017-02-11 NOTE — ED Triage Notes (Signed)
EMS reports from home witness fall, tripped onto sidewalk, c/o forearm pain, no obvious deformities, prior Dx of hernia c/o abdominal pain at hernia site  BP 120/82 HR 82 Resp 20 CBG 123

## 2017-02-11 NOTE — ED Notes (Signed)
Confirmed with Asher MuirJamie, PA that no meds prior to d/c or rx given

## 2017-02-11 NOTE — ED Notes (Signed)
Bed: [facility] Expected date:  Expected time:  Means of arrival:  Comments: 
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
## 2017-02-11 NOTE — Discharge Instructions (Signed)
It was my pleasure taking care of you today!   Please call the orthopedic doctor listed tomorrow morning to schedule a follow up appointment.   Please wear sling until you are seen by the orthopedic doctor. Ice the area for pain relief.   Would also like you to follow-up with your surgery and GI team.  Return to ER for new or worsening symptoms, any additional concerns.

## 2017-03-02 ENCOUNTER — Encounter (HOSPITAL_COMMUNITY): Payer: Self-pay

## 2017-03-02 ENCOUNTER — Other Ambulatory Visit: Payer: Self-pay

## 2017-03-02 ENCOUNTER — Emergency Department (HOSPITAL_COMMUNITY)
Admission: EM | Admit: 2017-03-02 | Discharge: 2017-03-03 | Disposition: A | Discharge status: Home / Self Care | Payer: Medicaid Other | Attending: Emergency Medicine | Admitting: Emergency Medicine

## 2017-03-02 DIAGNOSIS — I10 Essential (primary) hypertension: Secondary | ICD-10-CM | POA: Insufficient documentation

## 2017-03-02 DIAGNOSIS — F17200 Nicotine dependence, unspecified, uncomplicated: Secondary | ICD-10-CM | POA: Diagnosis not present

## 2017-03-02 DIAGNOSIS — R1084 Generalized abdominal pain: Secondary | ICD-10-CM

## 2017-03-02 DIAGNOSIS — Z79899 Other long term (current) drug therapy: Secondary | ICD-10-CM | POA: Diagnosis not present

## 2017-03-02 DIAGNOSIS — R1012 Left upper quadrant pain: Secondary | ICD-10-CM | POA: Insufficient documentation

## 2017-03-02 LAB — COMPREHENSIVE METABOLIC PANEL
ALBUMIN: 2.1 g/dL — AB (ref 3.5–5.0)
ALK PHOS: 170 U/L — AB (ref 38–126)
ALT: 16 U/L (ref 14–54)
ANION GAP: 4 — AB (ref 5–15)
AST: 38 U/L (ref 15–41)
BILIRUBIN TOTAL: 1.6 mg/dL — AB (ref 0.3–1.2)
BUN: 10 mg/dL (ref 6–20)
CALCIUM: 8.1 mg/dL — AB (ref 8.9–10.3)
CO2: 25 mmol/L (ref 22–32)
Chloride: 110 mmol/L (ref 101–111)
Creatinine, Ser: 0.62 mg/dL (ref 0.44–1.00)
GFR calc non Af Amer: >60 mL/min (ref >60)
GLUCOSE: 138 mg/dL — AB (ref 65–99)
Potassium: 3.5 mmol/L (ref 3.5–5.1)
Sodium: 139 mmol/L (ref 135–145)
Total Protein: 6.9 g/dL (ref 6.5–8.1)

## 2017-03-02 LAB — CBC
HCT: 32.4 % — ABNORMAL LOW (ref 36.0–46.0)
HEMOGLOBIN: 10.9 g/dL — AB (ref 12.0–15.0)
MCH: 34.7 pg — ABNORMAL HIGH (ref 26.0–34.0)
MCHC: 33.6 g/dL (ref 30.0–36.0)
MCV: 103.2 fL — ABNORMAL HIGH (ref 78.0–100.0)
Platelets: 127 10*3/uL — ABNORMAL LOW (ref 150–400)
RBC: 3.14 MIL/uL — ABNORMAL LOW (ref 3.87–5.11)
RDW: 14.9 % (ref 11.5–15.5)
WBC: 4.6 10*3/uL (ref 4.0–10.5)

## 2017-03-02 LAB — LIPASE, BLOOD: Lipase: 28 U/L (ref 11–51)

## 2017-03-02 MED ORDER — IOPAMIDOL (ISOVUE-300) INJECTION 61%
INTRAVENOUS | Status: AC
  Administered 2017-03-03: 100 mL
  Filled 2017-03-02: qty 100

## 2017-03-02 MED ORDER — MORPHINE SULFATE (PF) 4 MG/ML IV SOLN: 4.0000 mg | Freq: Once | INTRAVENOUS | Status: DC

## 2017-03-02 MED ORDER — ONDANSETRON HCL 4 MG/2ML IJ SOLN: 4.0000 mg | Freq: Once | INTRAMUSCULAR | Status: DC

## 2017-03-02 NOTE — ED Triage Notes (Signed)
Pt reports abdominal pain and N/V starting today rated 9/10. Pt reports 2 episodes of vomiting in past 24 hours. Pt reports last BM 3-4 days ago. Pt reports she takes lactulose at morning and at night. Pt reports she is allergic to fentanyl and morphine because her eyes cross and that they normally give her dilaudid.
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
## 2017-03-02 NOTE — ED Notes (Signed)
Pt refused CT scan and said she wouldn't get it until she received pain medicine. Pt offered morphine and zofran. Pt refused morphine and stated "it makes my eyes cross and swell." [name], PA and Blinda LeatherwoodPollina, MD informed. This RN then offered pt zofran for her nausea and she refused saying her N/V was yesterday. Pt offered CT scan again. Pt said she would have the scan done. CT notified pt ready for scan.

## 2017-03-03 ENCOUNTER — Encounter (HOSPITAL_COMMUNITY): Payer: Self-pay | Admitting: Radiology

## 2017-03-03 ENCOUNTER — Emergency Department (HOSPITAL_COMMUNITY): Payer: Medicaid Other

## 2017-03-03 NOTE — ED Notes (Signed)
[place], [place] notified that pt requesting to leave without receiving results if she does not receive pain medicine.

## 2017-03-03 NOTE — Discharge Instructions (Signed)
Continue taking your home medications as prescribed.  Follow-up with your primary care physician for reevaluation of your symptoms.  You were also instructed to follow-up with the gastrointestinal doctors after your last admission to the hospital.  Return to the emergency department if any concerning signs or symptoms develop such as fevers, or if you are unable to keep any food or drink down.

## 2017-03-03 NOTE — ED Notes (Signed)
This RN went over discharge instructions with Pt. Pt refused to take them with her.

## 2017-03-03 NOTE — ED Notes (Signed)
Patient transported to CT 

## 2017-03-03 NOTE — ED Notes (Addendum)
Mina, [place] at bedside explaining to pt the risk of giving pt dilaudid if pt has had reaction to morphine. Pt explained the risk of leaving against medical advice by Nunda[place]ina, [place]. Pt verbalized understanding of leaving AMA.

## 2017-03-06 ENCOUNTER — Ambulatory Visit: Payer: Medicaid Other | Admitting: Family Medicine

## 2017-03-09 ENCOUNTER — Ambulatory Visit: Payer: Medicaid Other | Admitting: Family Medicine

## 2017-03-11 ENCOUNTER — Ambulatory Visit: Payer: Medicaid Other | Admitting: Family Medicine

## 2017-03-13 ENCOUNTER — Ambulatory Visit: Payer: Medicaid Other | Admitting: Family Medicine

## 2017-03-16 ENCOUNTER — Encounter: Payer: Self-pay | Admitting: Family Medicine

## 2017-03-16 ENCOUNTER — Ambulatory Visit (INDEPENDENT_AMBULATORY_CARE_PROVIDER_SITE_OTHER): Payer: Medicaid Other | Admitting: Family Medicine

## 2017-03-16 VITALS — BP 110/60 | HR 74 | Temp 98.4°F | Ht 60.0 in | Wt 197.0 lb

## 2017-03-16 DIAGNOSIS — K429 Umbilical hernia without obstruction or gangrene: Secondary | ICD-10-CM | POA: Diagnosis not present

## 2017-03-16 DIAGNOSIS — Z23 Encounter for immunization: Secondary | ICD-10-CM

## 2017-03-16 DIAGNOSIS — Z131 Encounter for screening for diabetes mellitus: Secondary | ICD-10-CM | POA: Diagnosis not present

## 2017-03-16 DIAGNOSIS — R82998 Other abnormal findings in urine: Secondary | ICD-10-CM | POA: Diagnosis not present

## 2017-03-16 LAB — POCT GLYCOSYLATED HEMOGLOBIN (HGB A1C): HEMOGLOBIN A1C: 4.8

## 2017-03-16 LAB — POCT URINALYSIS DIP (DEVICE)
BILIRUBIN URINE: NEGATIVE
Glucose, UA: NEGATIVE mg/dL
HGB URINE DIPSTICK: NEGATIVE
Ketones, ur: NEGATIVE mg/dL
NITRITE: NEGATIVE
Protein, ur: NEGATIVE mg/dL
Specific Gravity, Urine: 1.025 (ref 1.005–1.030)
Urobilinogen, UA: 4 mg/dL — ABNORMAL HIGH (ref 0.0–1.0)
pH: 7 (ref 5.0–8.0)

## 2017-03-16 NOTE — Progress Notes (Signed)
Patient ID: [name], female    DOB: 23-Nov-1965, 52 y.o.   MRN: [id number]  PCP: [name], FNP  Chief Complaint  Patient presents with  . Follow-up    hernia    Subjective:  HPI Linda Vaughn is a 52 y.o. female with a chronic umbilical hernia, presents for evaluation and requesting a surgical consult to another general surgeon. Linda Vaughn reports a long-standing umbilical hernia persisent for more than 3 years. Reports that after evaluation by physician at [facility], surgery was not recommended. Linda Vaughn reports that her hernia is painful at time and uncomfortable as it protrudes from her abdomen. Denies sharp pain, nausea, vomiting, or prior history of incarceration.  Social History   Socioeconomic History  . Marital status: Single    Spouse name: Not on file  . Number of children: Not on file  . Years of education: Not on file  . Highest education level: Not on file  Social Needs  . Financial resource strain: Not on file  . Food insecurity - worry: Not on file  . Food insecurity - inability: Not on file  . Transportation needs - medical: Not on file  . Transportation needs - non-medical: Not on file  Occupational History  . Not on file  Tobacco Use  . Smoking status: Current Every Day Smoker    Packs/day: 0.25    Years: 36.00    Pack years: 9.00  . Smokeless tobacco: Never Used  Substance and Sexual Activity  . Alcohol use: No    Alcohol/week: 0.0 oz    Comment: occ  . Drug use: No  . Sexual activity: Not Currently  Other Topics Concern  . Not on file  Social History Narrative  . Not on file    Family History  Problem Relation Age of Onset  . Cancer Mother   . Rheum arthritis Father   . Diabetes Other    Review of Systems See pertinent information in HPI  Patient Active Problem List   Diagnosis Date Noted  . AKI (acute kidney injury) (HCC) 01/29/2017  . Nausea & vomiting 11/24/2016  . Hepatic encephalopathy (HCC) 10/08/2015  . Abdominal  pain 10/08/2015  . Sepsis (HCC)   . SBP (spontaneous bacterial peritonitis) (HCC)   . Hepatic cirrhosis (HCC)   . Chronic hepatitis C without hepatic coma (HCC)   . Polysubstance abuse (HCC)   . Ascites 05/24/2015  . Tobacco dependence 04/04/2015  . Umbilical hernia without obstruction and without gangrene 04/04/2015  . Chronic ankle pain 01/27/2015  . Obesity 01/27/2015  . Depression 01/27/2015  . Osteoarthritis 01/25/2015  . Cholelithiasis 04/20/2012  . HTN (hypertension) 04/20/2012  . Obesity (BMI 30.0-34.9) 04/20/2012    Allergies  Allergen Reactions  . Aspirin Other (See Comments)    Liver damage   . Ibuprofen Other (See Comments)    Liver damage  . Tylenol [Acetaminophen] Other (See Comments)    Liver damage    Prior to Admission medications   Medication Sig Start Date End Date Taking? Authorizing Provider  carvedilol (COREG) 6.25 MG tablet Take 6.25 mg by mouth 2 (two) times daily. 12/05/16  Yes [provider]  folic acid (FOLVITE) 1 MG tablet Take 1 tablet (1 mg total) by mouth daily. 01/31/17  Yes Roberto Scales D, MD  spironolactone (ALDACTONE) 100 MG tablet TAKE 1 TABLET (100 MG TOTAL) BY MOUTH DAILY. 02/04/17  Yes [name], FNP  thiamine 100 MG tablet Take 1 tablet (100 mg total)  by mouth daily. 01/31/17  Yes Roberto ScalesNettey, Shayla D, MD  lactulose (CHRONULAC) 10 GM/15ML solution TAKE 15 MLS TWICE A DAY Patient not taking: Reported on 03/16/2017 01/31/17   Roberto ScalesNettey, Shayla D, MD  lactulose, encephalopathy, (CHRONULAC) 10 GM/15ML SOLN TAKE 15 MLS TWICE DAILY 02/01/17   [provider]  midodrine (PROAMATINE) 2.5 MG tablet Take 2 tabs three times a day for seven days (1/26-2/1) Take 1 tab three times a day for seven days (2/2-2/8) Patient not taking: Reported on 03/16/2017 01/31/17   Laverna PeaceNettey, Shayla D, MD  traMADol (ULTRAM) 50 MG tablet Take 1 tablet (50 mg total) by mouth every 6 (six) hours as needed. Patient not taking: Reported on 03/16/2017 01/31/17  01/31/18  Laverna PeaceNettey, Shayla D, MD    Past Medical, Surgical Family and Social History reviewed and updated.    Objective:   Today's Vitals   03/16/17 0953  BP: 110/60  Pulse: 74  Temp: 98.4 F (36.9 C)  TempSrc: Oral  SpO2: 98%  Weight: 197 lb (89.4 kg)  Height: 5' (1.524 m)    Wt Readings from Last 3 Encounters:  03/16/17 197 lb (89.4 kg)  03/02/17 207 lb (93.9 kg)  01/31/17 207 lb 0.2 oz (93.9 kg)    Physical Exam  Constitutional: She is oriented to person, place, and time. She appears well-developed and well-nourished.  Cardiovascular: Normal rate, regular rhythm, normal heart sounds and intact distal pulses.  Pulmonary/Chest: Effort normal and breath sounds normal.  Abdominal: Soft. Normal appearance and bowel sounds are normal. A hernia is present.  Neurological: She is alert and oriented to person, place, and time.  Skin: Skin is warm and dry.  Psychiatric: She has a normal mood and affect. Her behavior is normal. Judgment and thought content normal.   Assessment & Plan:  1. Umbilical hernia without obstruction and without gangrene, [facility]. Requesting a second opinion. Will refer to [facility] for a second opinion regarding surgical removal of hernia.  2. Need for Tdap vaccination, Tdap vaccine greater than or equal to 7yo IM  3. Screening for diabetes mellitus, 4.8, normal. Repeat in 12 months .  Orders Placed This Encounter  Procedures  . Urine Culture  . Tdap vaccine greater than or equal to 7yo IM  . Ambulatory referral to General Surgery  . POCT urinalysis dip (device)  . HgB A1c    RTC: 3 months chronic conditions with Julianne HandlerLachina Hollis, FNP   Godfrey PickKimberly S. Tiburcio PeaHarris, MSN, FNP-C [facility]  [address] [place], [place] [address] [phone]

## 2017-03-18 LAB — URINE CULTURE: ORGANISM ID, BACTERIA: NO GROWTH

## 2017-03-22 ENCOUNTER — Other Ambulatory Visit: Payer: Self-pay | Admitting: Family Medicine

## 2017-03-22 ENCOUNTER — Telehealth: Payer: Self-pay | Admitting: Family Medicine

## 2017-03-22 NOTE — Telephone Encounter (Signed)
Please processed [facility] for [facility].    Godfrey PickKimberly S. Tiburcio PeaHarris, MSN, FNP-C [facility]  [address] [place], [place] [address] [phone]

## 2017-03-24 NOTE — Telephone Encounter (Signed)
This has been faxed. Thanks!

## 2017-03-25 ENCOUNTER — Emergency Department (HOSPITAL_COMMUNITY): Payer: Medicaid Other

## 2017-03-25 ENCOUNTER — Encounter (HOSPITAL_COMMUNITY): Payer: Self-pay | Admitting: Emergency Medicine

## 2017-03-25 ENCOUNTER — Other Ambulatory Visit: Payer: Self-pay

## 2017-03-25 ENCOUNTER — Inpatient Hospital Stay (HOSPITAL_COMMUNITY)
Admission: EM | Admit: 2017-03-25 | Discharge: 2017-03-27 | DRG: 441 | Disposition: A | Discharge status: Home / Self Care | Payer: Medicaid Other | Attending: Internal Medicine | Admitting: Internal Medicine

## 2017-03-25 DIAGNOSIS — B182 Chronic viral hepatitis C: Secondary | ICD-10-CM | POA: Diagnosis not present

## 2017-03-25 DIAGNOSIS — K7031 Alcoholic cirrhosis of liver with ascites: Secondary | ICD-10-CM | POA: Diagnosis not present

## 2017-03-25 DIAGNOSIS — E860 Dehydration: Secondary | ICD-10-CM | POA: Diagnosis present

## 2017-03-25 DIAGNOSIS — I10 Essential (primary) hypertension: Secondary | ICD-10-CM | POA: Diagnosis present

## 2017-03-25 DIAGNOSIS — Z886 Allergy status to analgesic agent status: Secondary | ICD-10-CM

## 2017-03-25 DIAGNOSIS — M1990 Unspecified osteoarthritis, unspecified site: Secondary | ICD-10-CM | POA: Diagnosis present

## 2017-03-25 DIAGNOSIS — G9341 Metabolic encephalopathy: Secondary | ICD-10-CM | POA: Diagnosis present

## 2017-03-25 DIAGNOSIS — K7460 Unspecified cirrhosis of liver: Secondary | ICD-10-CM | POA: Diagnosis present

## 2017-03-25 DIAGNOSIS — R188 Other ascites: Secondary | ICD-10-CM | POA: Diagnosis present

## 2017-03-25 DIAGNOSIS — Z809 Family history of malignant neoplasm, unspecified: Secondary | ICD-10-CM

## 2017-03-25 DIAGNOSIS — K7200 Acute and subacute hepatic failure without coma: Principal | ICD-10-CM | POA: Diagnosis present

## 2017-03-25 DIAGNOSIS — K429 Umbilical hernia without obstruction or gangrene: Secondary | ICD-10-CM | POA: Diagnosis present

## 2017-03-25 DIAGNOSIS — Z9049 Acquired absence of other specified parts of digestive tract: Secondary | ICD-10-CM | POA: Diagnosis not present

## 2017-03-25 DIAGNOSIS — B1920 Unspecified viral hepatitis C without hepatic coma: Secondary | ICD-10-CM | POA: Diagnosis present

## 2017-03-25 DIAGNOSIS — K7682 Hepatic encephalopathy: Secondary | ICD-10-CM | POA: Diagnosis present

## 2017-03-25 DIAGNOSIS — D539 Nutritional anemia, unspecified: Secondary | ICD-10-CM | POA: Diagnosis present

## 2017-03-25 DIAGNOSIS — F17210 Nicotine dependence, cigarettes, uncomplicated: Secondary | ICD-10-CM | POA: Diagnosis present

## 2017-03-25 DIAGNOSIS — Z8261 Family history of arthritis: Secondary | ICD-10-CM | POA: Diagnosis not present

## 2017-03-25 DIAGNOSIS — J90 Pleural effusion, not elsewhere classified: Secondary | ICD-10-CM | POA: Diagnosis present

## 2017-03-25 DIAGNOSIS — D696 Thrombocytopenia, unspecified: Secondary | ICD-10-CM | POA: Diagnosis present

## 2017-03-25 DIAGNOSIS — Z9119 Patient's noncompliance with other medical treatment and regimen: Secondary | ICD-10-CM | POA: Diagnosis not present

## 2017-03-25 DIAGNOSIS — R079 Chest pain, unspecified: Secondary | ICD-10-CM

## 2017-03-25 DIAGNOSIS — R4182 Altered mental status, unspecified: Secondary | ICD-10-CM | POA: Diagnosis present

## 2017-03-25 HISTORY — DX: Nutritional anemia, unspecified: D53.9

## 2017-03-25 HISTORY — DX: Thrombocytopenia, unspecified: D69.6

## 2017-03-25 HISTORY — DX: Umbilical hernia without obstruction or gangrene: K42.9

## 2017-03-25 HISTORY — DX: Pleural effusion, not elsewhere classified: J90

## 2017-03-25 LAB — BASIC METABOLIC PANEL
ANION GAP: 5 (ref 5–15)
BUN: 13 mg/dL (ref 6–20)
CALCIUM: 8.6 mg/dL — AB (ref 8.9–10.3)
CO2: 24 mmol/L (ref 22–32)
CREATININE: 0.76 mg/dL (ref 0.44–1.00)
Chloride: 108 mmol/L (ref 101–111)
GLUCOSE: 119 mg/dL — AB (ref 65–99)
Potassium: 4 mmol/L (ref 3.5–5.1)
Sodium: 137 mmol/L (ref 135–145)

## 2017-03-25 LAB — CBC WITH DIFFERENTIAL/PLATELET
BASOS PCT: 1 %
Basophils Absolute: 0 10*3/uL (ref 0.0–0.1)
Eosinophils Absolute: 0.5 10*3/uL (ref 0.0–0.7)
Eosinophils Relative: 10 %
HEMATOCRIT: 31.2 % — AB (ref 36.0–46.0)
HEMOGLOBIN: 10.7 g/dL — AB (ref 12.0–15.0)
LYMPHS ABS: 2.1 10*3/uL (ref 0.7–4.0)
Lymphocytes Relative: 38 %
MCH: 34.7 pg — AB (ref 26.0–34.0)
MCHC: 34.3 g/dL (ref 30.0–36.0)
MCV: 101.3 fL — AB (ref 78.0–100.0)
MONO ABS: 1.2 10*3/uL — AB (ref 0.1–1.0)
MONOS PCT: 22 %
NEUTROS ABS: 1.6 10*3/uL — AB (ref 1.7–7.7)
NEUTROS PCT: 29 %
Platelets: 143 10*3/uL — ABNORMAL LOW (ref 150–400)
RBC: 3.08 MIL/uL — ABNORMAL LOW (ref 3.87–5.11)
RDW: 16.4 % — AB (ref 11.5–15.5)
WBC: 5.4 10*3/uL (ref 4.0–10.5)

## 2017-03-25 LAB — HEPATIC FUNCTION PANEL
ALBUMIN: 2.4 g/dL — AB (ref 3.5–5.0)
ALK PHOS: 146 U/L — AB (ref 38–126)
ALT: 18 U/L (ref 14–54)
AST: 39 U/L (ref 15–41)
Bilirubin, Direct: 0.9 mg/dL — ABNORMAL HIGH (ref 0.1–0.5)
Indirect Bilirubin: 1.6 mg/dL — ABNORMAL HIGH (ref 0.3–0.9)
Total Bilirubin: 2.5 mg/dL — ABNORMAL HIGH (ref 0.3–1.2)
Total Protein: 8.4 g/dL — ABNORMAL HIGH (ref 6.5–8.1)

## 2017-03-25 LAB — I-STAT ARTERIAL BLOOD GAS, ED
ACID-BASE EXCESS: 2 mmol/L (ref 0.0–2.0)
Bicarbonate: 24.9 mmol/L (ref 20.0–28.0)
O2 SAT: 99 %
PH ART: 7.484 — AB (ref 7.350–7.450)
PO2 ART: 123 mmHg — AB (ref 83.0–108.0)
TCO2: 26 mmol/L (ref 22–32)
pCO2 arterial: 33.1 mmHg (ref 32.0–48.0)

## 2017-03-25 LAB — I-STAT TROPONIN, ED
TROPONIN I, POC: 0 ng/mL (ref 0.00–0.08)
Troponin i, poc: 0 ng/mL (ref 0.00–0.08)

## 2017-03-25 LAB — RAPID URINE DRUG SCREEN, HOSP PERFORMED
Amphetamines: NOT DETECTED
BARBITURATES: NOT DETECTED
BENZODIAZEPINES: NOT DETECTED
COCAINE: POSITIVE — AB
OPIATES: NOT DETECTED
TETRAHYDROCANNABINOL: POSITIVE — AB

## 2017-03-25 LAB — PROTIME-INR
INR: 1.84
PROTHROMBIN TIME: 21.1 s — AB (ref 11.4–15.2)

## 2017-03-25 LAB — ETHANOL: Alcohol, Ethyl (B): <10 mg/dL (ref <10)

## 2017-03-25 LAB — CBG MONITORING, ED: GLUCOSE-CAPILLARY: 91 mg/dL (ref 65–99)

## 2017-03-25 LAB — I-STAT BETA HCG BLOOD, ED (MC, WL, AP ONLY)

## 2017-03-25 LAB — I-STAT CG4 LACTIC ACID, ED: Lactic Acid, Venous: 0.66 mmol/L (ref 0.5–1.9)

## 2017-03-25 LAB — AMMONIA: Ammonia: 134 umol/L — ABNORMAL HIGH (ref 9–35)

## 2017-03-25 MED ORDER — IOPAMIDOL (ISOVUE-300) INJECTION 61%
INTRAVENOUS | Status: AC
  Administered 2017-03-25: 100 mL
  Filled 2017-03-25: qty 100

## 2017-03-25 MED ORDER — NALOXONE HCL 2 MG/2ML IJ SOSY
1.0000 mg | PREFILLED_SYRINGE | Freq: Once | INTRAMUSCULAR | Status: AC
  Administered 2017-03-25: 1 mg via INTRAVENOUS
  Filled 2017-03-25: qty 2

## 2017-03-25 MED ORDER — ONDANSETRON HCL 4 MG PO TABS: 4.0000 mg | ORAL_TABLET | Freq: Four times a day (QID) | ORAL | Status: DC | PRN

## 2017-03-25 MED ORDER — ONDANSETRON HCL 4 MG/2ML IJ SOLN: 4.0000 mg | Freq: Four times a day (QID) | INTRAMUSCULAR | Status: DC | PRN

## 2017-03-25 MED ORDER — ONDANSETRON HCL 4 MG/2ML IJ SOLN
4.0000 mg | Freq: Once | INTRAMUSCULAR | Status: AC
  Administered 2017-03-25: 4 mg via INTRAVENOUS
  Filled 2017-03-25: qty 2

## 2017-03-25 MED ORDER — TRAMADOL HCL 50 MG PO TABS
50.0000 mg | ORAL_TABLET | Freq: Four times a day (QID) | ORAL | Status: DC | PRN
  Administered 2017-03-25 – 2017-03-27 (×4): 50 mg via ORAL
  Filled 2017-03-25 (×4): qty 1

## 2017-03-25 MED ORDER — NITROGLYCERIN 0.4 MG SL SUBL: 0.4000 mg | SUBLINGUAL_TABLET | SUBLINGUAL | Status: DC | PRN

## 2017-03-25 MED ORDER — RIFAXIMIN 550 MG PO TABS
550.0000 mg | ORAL_TABLET | Freq: Two times a day (BID) | ORAL | Status: DC
  Administered 2017-03-25 – 2017-03-27 (×4): 550 mg via ORAL
  Filled 2017-03-25 (×5): qty 1

## 2017-03-25 MED ORDER — LACTULOSE 10 GM/15ML PO SOLN
30.0000 g | Freq: Three times a day (TID) | ORAL | Status: DC
  Administered 2017-03-25 (×2): 30 g via ORAL
  Administered 2017-03-26: 20 g via ORAL
  Administered 2017-03-26: 30 g via ORAL
  Administered 2017-03-26: 20 g via ORAL
  Administered 2017-03-27: 30 g via ORAL
  Filled 2017-03-25 (×5): qty 45
  Filled 2017-03-25: qty 60
  Filled 2017-03-25: qty 45

## 2017-03-25 MED ORDER — SODIUM CHLORIDE 0.9% FLUSH
3.0000 mL | Freq: Two times a day (BID) | INTRAVENOUS | Status: DC
  Administered 2017-03-26 – 2017-03-27 (×3): 3 mL via INTRAVENOUS

## 2017-03-25 MED ORDER — LACTULOSE ENEMA
300.0000 mL | Freq: Two times a day (BID) | ORAL | Status: DC
  Filled 2017-03-25: qty 300

## 2017-03-25 MED ORDER — SODIUM CHLORIDE 0.9 % IV SOLN
INTRAVENOUS | Status: DC
Start: 2017-03-25 — End: 2017-03-26
  Administered 2017-03-25: 09:00:00 via INTRAVENOUS

## 2017-03-25 NOTE — ED Notes (Signed)
Patient ambulatory to bathroom with steady gait at this time 

## 2017-03-25 NOTE — ED Notes (Signed)
Pt c/o pain in both her hands, but is unable to verbalize specifics; pt does not appear to be in any distress at this time

## 2017-03-25 NOTE — ED Notes (Signed)
No Diet Ordered for Dinner. Pt. NPO. 

## 2017-03-25 NOTE — ED Triage Notes (Signed)
Patient here with chest pain, dizziness and weakness that is sharp in nature, radiates down right arm and back again. Nausea and vomiting x3, no shortness of breath.

## 2017-03-25 NOTE — ED Provider Notes (Signed)
9:31 AM Involved by admitting team for increasing AMS. Awakens to painful stimuli only. Moves all 4 extremities. CBG normal. No response to IV narcan. Protecting airway. Will add CT head and CT abd/pelvis. Reducible chronic umbilical hernia. Add on LFTs, ammonia, UDS and ETOH. No narcotics or sedating meds found in her purse. Spironolactone bottle with 1 pill left. No lactulose in her purse. Will need admission to step down. ABG without hypercarbia. No indication for intubation at this time. Discussed case with admitting team. I will follow along with them   Azalia Bilisampos, [place] Throck[place]on, MD 03/25/17 [phone]

## 2017-03-25 NOTE — ED Notes (Signed)
Pt had very large BM

## 2017-03-25 NOTE — ED Notes (Signed)
Regular Diet was ordered for Dinner. 

## 2017-03-25 NOTE — ED Notes (Signed)
Pt used bedside commode.

## 2017-03-25 NOTE — Progress Notes (Signed)
Nursing staff has notified me patient is now alert and more oriented and able to take medications by mouth.  Rectal lactulose discontinued in favor of lactulose 30 g p.o. 3 times daily in conjunction with rifaximin 550 twice daily  Junious SilkAllison Reeshemah Nazaryan, ANP
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
## 2017-03-25 NOTE — ED Notes (Signed)
Delayed response to narcan

## 2017-03-25 NOTE — ED Notes (Signed)
ED Provider at bedside. 

## 2017-03-25 NOTE — ED Notes (Signed)
Ordered rectal tube from materials
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
## 2017-03-25 NOTE — ED Notes (Signed)
Called pharmacy to send lactulose.

## 2017-03-25 NOTE — Care Management Note (Signed)
Case Management Note  Patient Details  Name: Otho DarnerJarryl D Velador MRN: [id number] Date of Birth: [birth year]  Subjective/Objective:                  52 y.o. 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  From home with significant other.  Action/Plan: Admit status INPATIENT (Acute metabolic/hepatic encephalopathy/ Hepatitis C Cirrhosis of liver); anticipate discharge HOME WITH HOME HEALTH.   Expected Discharge Date:  (unknown)               Expected Discharge Plan:  Home w Home Health Services  In-House Referral:     Discharge planning Services  CM Consult  Post Acute Care Choice:    Choice offered to:     DME Arranged:    DME Agency:     HH Arranged:    HH Agency:     Status of Service:  In process, will continue to follow  If discussed at Long Length of Stay Meetings, dates discussed:    Additional Comments: HAS 5 ED VISITS, 2 HOSP ADMISSIONS IN LAST 6 MONTHS.  Oletta CohnWood, Briseis Aguilera, RN 03/25/2017, 11:56 AM

## 2017-03-25 NOTE — ED Notes (Signed)
MD notified that rectal tubes are on back order

## 2017-03-25 NOTE — ED Notes (Signed)
Erica from materials states that rectal tubes are on back order

## 2017-03-25 NOTE — ED Notes (Signed)
Pt found standing at bedside requesting to use bathroom; pt given bedside commode; pt able to follow commands; assisted back to bed

## 2017-03-25 NOTE — ED Notes (Signed)
RN was preparing to give rectal and Patient woke up. Revonda Standardllison contacted and order changed to PO lactulose. Patient took without problems

## 2017-03-25 NOTE — Progress Notes (Addendum)
Chart reviewed after initial signout obtained from the EDP.  EDP concern over patient's chest discomfort and unclear whether related to cardiac or abdominal issues.  Orders subsequently placed by myself for hepatic function panel, ammonia level, alcohol level and urine drug screen.  Upon my evaluation of the patient she was quite obtunded and would only awaken to palpation over the epigastrium and right upper quadrant which elicited pain.  Hernia noted to be chronically distended but did not appear to be incarcerated on initial exam.  HIV and ABG subsequently ordered.  Case discussed with my attending who returned to the room to examine the patient.  After her examination she then requested that EDP reevaluate the patient due to severe obtunded state and abdominal pain of uncertain etiology.  Unless the patient requires ICU level of care we will likely reaccept this patient to our service wants EDP completes additional evaluation.  Junious SilkAllison Shannel Zahm, ANP

## 2017-03-26 ENCOUNTER — Other Ambulatory Visit: Payer: Self-pay

## 2017-03-26 DIAGNOSIS — K429 Umbilical hernia without obstruction or gangrene: Secondary | ICD-10-CM

## 2017-03-26 DIAGNOSIS — J90 Pleural effusion, not elsewhere classified: Secondary | ICD-10-CM

## 2017-03-26 DIAGNOSIS — B182 Chronic viral hepatitis C: Secondary | ICD-10-CM

## 2017-03-26 DIAGNOSIS — D539 Nutritional anemia, unspecified: Secondary | ICD-10-CM

## 2017-03-26 DIAGNOSIS — K7200 Acute and subacute hepatic failure without coma: Principal | ICD-10-CM

## 2017-03-26 DIAGNOSIS — D696 Thrombocytopenia, unspecified: Secondary | ICD-10-CM

## 2017-03-26 DIAGNOSIS — I10 Essential (primary) hypertension: Secondary | ICD-10-CM

## 2017-03-26 DIAGNOSIS — K7031 Alcoholic cirrhosis of liver with ascites: Secondary | ICD-10-CM

## 2017-03-26 LAB — CBC WITH DIFFERENTIAL/PLATELET
Basophils Absolute: 0 10*3/uL (ref 0.0–0.1)
Basophils Relative: 0 %
EOS ABS: 0.4 10*3/uL (ref 0.0–0.7)
EOS PCT: 8 %
HCT: 30.6 % — ABNORMAL LOW (ref 36.0–46.0)
Hemoglobin: 10.6 g/dL — ABNORMAL LOW (ref 12.0–15.0)
LYMPHS ABS: 2.6 10*3/uL (ref 0.7–4.0)
Lymphocytes Relative: 47 %
MCH: 35.5 pg — AB (ref 26.0–34.0)
MCHC: 34.6 g/dL (ref 30.0–36.0)
MCV: 102.3 fL — ABNORMAL HIGH (ref 78.0–100.0)
MONO ABS: 0.9 10*3/uL (ref 0.1–1.0)
Monocytes Relative: 16 %
Neutro Abs: 1.6 10*3/uL — ABNORMAL LOW (ref 1.7–7.7)
Neutrophils Relative %: 29 %
PLATELETS: 143 10*3/uL — AB (ref 150–400)
RBC: 2.99 MIL/uL — AB (ref 3.87–5.11)
RDW: 16.5 % — AB (ref 11.5–15.5)
WBC: 5.5 10*3/uL (ref 4.0–10.5)

## 2017-03-26 LAB — COMPREHENSIVE METABOLIC PANEL
ALT: 18 U/L (ref 14–54)
AST: 36 U/L (ref 15–41)
Albumin: 2.1 g/dL — ABNORMAL LOW (ref 3.5–5.0)
Alkaline Phosphatase: 109 U/L (ref 38–126)
Anion gap: 6 (ref 5–15)
BILIRUBIN TOTAL: 2.5 mg/dL — AB (ref 0.3–1.2)
BUN: 11 mg/dL (ref 6–20)
CHLORIDE: 107 mmol/L (ref 101–111)
CO2: 23 mmol/L (ref 22–32)
CREATININE: 0.75 mg/dL (ref 0.44–1.00)
Calcium: 8.3 mg/dL — ABNORMAL LOW (ref 8.9–10.3)
GFR calc Af Amer: >60 mL/min (ref >60)
Glucose, Bld: 85 mg/dL (ref 65–99)
Potassium: 3.9 mmol/L (ref 3.5–5.1)
Sodium: 136 mmol/L (ref 135–145)
Total Protein: 7.5 g/dL (ref 6.5–8.1)

## 2017-03-26 LAB — HIV ANTIBODY (ROUTINE TESTING W REFLEX): HIV Screen 4th Generation wRfx: NONREACTIVE

## 2017-03-26 LAB — AMMONIA: AMMONIA: 72 umol/L — AB (ref 9–35)

## 2017-03-26 LAB — MRSA PCR SCREENING: MRSA by PCR: NEGATIVE

## 2017-03-26 MED ORDER — FOLIC ACID 1 MG PO TABS
1.0000 mg | ORAL_TABLET | Freq: Every day | ORAL | Status: DC
Start: 2017-03-26 — End: 2017-03-27
  Administered 2017-03-26 – 2017-03-27 (×2): 1 mg via ORAL
  Filled 2017-03-26 (×2): qty 1

## 2017-03-26 MED ORDER — DULOXETINE HCL 20 MG PO CPEP
20.0000 mg | ORAL_CAPSULE | Freq: Two times a day (BID) | ORAL | Status: DC
  Administered 2017-03-26 – 2017-03-27 (×3): 20 mg via ORAL
  Filled 2017-03-26 (×3): qty 1

## 2017-03-26 MED ORDER — VITAMIN B-1 100 MG PO TABS
100.0000 mg | ORAL_TABLET | Freq: Every day | ORAL | Status: DC
  Administered 2017-03-26 – 2017-03-27 (×2): 100 mg via ORAL
  Filled 2017-03-26 (×2): qty 1

## 2017-03-26 MED ORDER — SPIRONOLACTONE 25 MG PO TABS
100.0000 mg | ORAL_TABLET | Freq: Every day | ORAL | Status: DC
  Administered 2017-03-26 – 2017-03-27 (×2): 100 mg via ORAL
  Filled 2017-03-26 (×2): qty 4

## 2017-03-26 NOTE — Progress Notes (Signed)
PROGRESS NOTE  Linda Vaughn TJQ:[id number] DOB: Mar 22, 1965 DOA: 03/25/2017 PCP: Dorena Dew, FNP  HPI/Recap of past 24 hours: Linda Vaughn is a 52 y.o. 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 While in the ED, pt was noted to have more altered mentation. Pt work up was unremarkable/unchanged from previous, except for elevated ammonia level. Pt was admitted for hepatic encephalopathy (pt probably not compliant to lactulose). Of note, pt was discharged on 1/26 after an admission for SBP with associated acute hepatic encephalopathy, culture was negative and pt discharged on Vantin. GI evaluated her and recommended adding rifaximin to her lactulose during hospitalization and was d/c on lactulose to follow up with her GI physicians at [facility]. During that same hospitalization surgery evaluated the hernia which was not incarcerated and was felt to be high risk surgical candidate for elective surgical repair. Pt also has been evaluated at by Gen surg at [facility], they also noted she was a high surgical risk patient.  Today, met pt eating breakfast, stating she is feeling better and ready to be discharged. Pt denies any worsening abdominal pain, N/V/D, denies chest pain, SOB, no fever/chills noted. Pt is AAO X 3.    Assessment/Plan: Principal Problem:   Acute metabolic encephalopathy Active Problems:   Umbilical hernia   Cirrhosis of liver (HCC)   Hypertension   Hepatitis C   Recurrent right pleural effusion   Thrombocytopenia (HCC)   Macrocytic anemia   Acute hepatic encephalopathy  Hepatic encephalopathy with hx of cirrhosis, +Hepatitis C Improving, AAO X 3 Likely due to non-compliance to lactulose Vs dehydration MELD-Na score 16 on admission Ammonia  elevated from baseline 134, repeat pending LFTs with T.bili up to 2.5 from 1.6 w/ normal AST/ALT  CT head without contrast:negative CT abdomen with small ascites, no need for paracentesis (afebrile, no WBC), no suspicion of SBP S/P gentle IVF, will stop as pt is tolerating orally Pt at home on ribavirin + epclusa, unavailable at our pharmacy Continue lactulose to 2-3 soft stools/day, continue rifaximin, aldactone  Chronic complex septated umbilical hernia Denies any current worsening abd pain CT abdomen and pelvis: Stable exam without new or substantially progressive interval Finding. Cirrhotic liver morphology with small to moderate volume ascites. Similar appearance of umbilical/paraumbilical hernia containing loculated fluid. Small right pleural effusion Evaluated by [facility] surg [facility] here [facility], pt high risk elective surgical candidate  Thrombocytopenia/macrocytic anemia Stable Secondary to cirrhosis/alchol abuse Continue FA, thiamine  Hypertension/?? Hx of Orthostatic hypotension Stable Held home carvedilol, ?Midodrine  Recurrent right pleural effusion Likely due to chronic cirrhosis, asymptomatic  ?Polysubstance abuse UDS + for THC, cocaine Pt denies any current use Counseled pt   Code Status: Full  Family Communication: None at bedside  Disposition Plan: Home by 03/27/17, if continues to improve   Consultants:  None  Procedures:  None  Antimicrobials:  None  DVT prophylaxis:  SCD as pt has thrombocytopenia   Objective: Vitals:   03/25/17 2323 03/26/17 0229 03/26/17 0437 03/26/17 0812  BP: 129/79 112/69    Pulse:  77    Resp: 17 18    Temp:   97.9 F (36.6 C) 97.9 F (36.6 C)  TempSrc:   Oral Oral  SpO2: 100% 96%    Weight:  89.2 kg (196 lb 10.4 oz)     Intake/Output Summary (Last 24 hours) at 03/26/2017 1224 Last data filed at 03/26/2017 1051 Gross per 24 hour  Intake 1335.5 ml  Output 550 ml  Net 785.5 ml   Filed  Weights   03/26/17 0437  Weight: 89.2 kg (196 lb 10.4 oz)    Exam:   General:  AAO X 3, NAD  Cardiovascular: S1, S2 present  Respiratory: Chest clear to auscultation bilaterally  Abdomen: Soft, nontender, mildly distended, positive large/reducible umbilical hernia, bowel sounds present  Musculoskeletal: Trace pedal edema bilaterally  Skin: Normal  Psychiatry: Normal mood   Data Reviewed: CBC: Recent Labs  Lab 03/25/17 0401 03/26/17 0658  WBC 5.4 5.5  NEUTROABS 1.6* 1.6*  HGB 10.7* 10.6*  HCT 31.2* 30.6*  MCV 101.3* 102.3*  PLT 143* 546*   Basic Metabolic Panel: Recent Labs  Lab 03/25/17 0358 03/26/17 0658  NA 137 136  K 4.0 3.9  CL 108 107  CO2 24 23  GLUCOSE 119* 85  BUN 13 11  CREATININE 0.76 0.75  CALCIUM 8.6* 8.3*   GFR: Estimated Creatinine Clearance: 80.9 mL/min (by C-G formula based on SCr of 0.75 mg/dL). Liver Function Tests: Recent Labs  Lab 03/25/17 0945 03/26/17 0658  AST 39 36  ALT 18 18  ALKPHOS 146* 109  BILITOT 2.5* 2.5*  PROT 8.4* 7.5  ALBUMIN 2.4* 2.1*   No results for input(s): LIPASE, AMYLASE in the last 168 hours. Recent Labs  Lab 03/25/17 0943  AMMONIA 134*   Coagulation Profile: Recent Labs  Lab 03/25/17 2053  INR 1.84   Cardiac Enzymes: No results for input(s): CKTOTAL, CKMB, CKMBINDEX, TROPONINI in the last 168 hours. BNP (last 3 results) No results for input(s): PROBNP in the last 8760 hours. HbA1C: No results for input(s): HGBA1C in the last 72 hours. CBG: Recent Labs  Lab 03/25/17 0902  GLUCAP 91   Lipid Profile: No results for input(s): CHOL, HDL, LDLCALC, TRIG, CHOLHDL, LDLDIRECT in the last 72 hours. Thyroid Function Tests: No results for input(s): TSH, T4TOTAL, FREET4, T3FREE, THYROIDAB in the last 72 hours. Anemia Panel: No results for input(s): VITAMINB12, FOLATE, FERRITIN, TIBC, IRON, RETICCTPCT in the last 72 hours. Urine analysis:    Component Value Date/Time   COLORURINE YELLOW  01/29/2017 0030   APPEARANCEUR CLEAR 01/29/2017 0030   LABSPEC 1.025 03/16/2017 1041   PHURINE 7.0 03/16/2017 1041   GLUCOSEU NEGATIVE 03/16/2017 1041   HGBUR NEGATIVE 03/16/2017 1041   [place] 03/16/2017 1041   KETONESUR NEGATIVE 03/16/2017 1041   PROTEINUR NEGATIVE 03/16/2017 1041   UROBILINOGEN 4.0 (H) 03/16/2017 1041   NITRITE NEGATIVE 03/16/2017 1041   LEUKOCYTESUR SMALL (A) 03/16/2017 1041   Sepsis Labs: @LABRCNTIP (procalcitonin:4,lacticidven:4)  ) Recent Results (from the past 240 hour(s))  MRSA PCR Screening     Status: None   Collection Time: 03/25/17  9:56 PM  Result Value Ref Range Status   MRSA by PCR NEGATIVE NEGATIVE Final    Comment:        The GeneXpert MRSA Assay (FDA approved for NASAL specimens only), is one component of a comprehensive MRSA colonization surveillance program. It is not intended to diagnose MRSA infection nor to guide or monitor treatment for MRSA infections. Performed at [facility], [place] [address], [place], [place] [address]       Studies: No results found.  Scheduled Meds: . lactulose  30 g Oral TID  . rifaximin  550 mg Oral BID  .  sodium chloride flush  3 mL Intravenous Q12H    Continuous Infusions:   LOS: 1 day     Alma Friendly, MD [facility]   If 7PM-7AM, please contact night-coverage [email or web address] Password [facility] 03/26/2017, 12:24 PM

## 2017-03-27 ENCOUNTER — Encounter (HOSPITAL_COMMUNITY): Payer: Self-pay | Admitting: *Deleted

## 2017-03-27 DIAGNOSIS — G9341 Metabolic encephalopathy: Secondary | ICD-10-CM

## 2017-03-27 LAB — AMMONIA: Ammonia: 83 umol/L — ABNORMAL HIGH (ref 9–35)

## 2017-03-27 LAB — CBC WITH DIFFERENTIAL/PLATELET
Basophils Absolute: 0 10*3/uL (ref 0.0–0.1)
Basophils Relative: 1 %
EOS PCT: 10 %
Eosinophils Absolute: 0.5 10*3/uL (ref 0.0–0.7)
HCT: 31 % — ABNORMAL LOW (ref 36.0–46.0)
Hemoglobin: 10.7 g/dL — ABNORMAL LOW (ref 12.0–15.0)
LYMPHS ABS: 2.2 10*3/uL (ref 0.7–4.0)
LYMPHS PCT: 43 %
MCH: 35.5 pg — AB (ref 26.0–34.0)
MCHC: 34.5 g/dL (ref 30.0–36.0)
MCV: 103 fL — ABNORMAL HIGH (ref 78.0–100.0)
MONO ABS: 1 10*3/uL (ref 0.1–1.0)
MONOS PCT: 21 %
Neutro Abs: 1.2 10*3/uL — ABNORMAL LOW (ref 1.7–7.7)
Neutrophils Relative %: 25 %
PLATELETS: 134 10*3/uL — AB (ref 150–400)
RBC: 3.01 MIL/uL — ABNORMAL LOW (ref 3.87–5.11)
RDW: 16.5 % — AB (ref 11.5–15.5)
WBC: 5 10*3/uL (ref 4.0–10.5)

## 2017-03-27 LAB — BASIC METABOLIC PANEL
Anion gap: 7 (ref 5–15)
BUN: 10 mg/dL (ref 6–20)
CHLORIDE: 102 mmol/L (ref 101–111)
CO2: 23 mmol/L (ref 22–32)
Calcium: 8.4 mg/dL — ABNORMAL LOW (ref 8.9–10.3)
Creatinine, Ser: 0.76 mg/dL (ref 0.44–1.00)
GFR calc Af Amer: >60 mL/min (ref >60)
GFR calc non Af Amer: >60 mL/min (ref >60)
GLUCOSE: 92 mg/dL (ref 65–99)
POTASSIUM: 4 mmol/L (ref 3.5–5.1)
Sodium: 132 mmol/L — ABNORMAL LOW (ref 135–145)

## 2017-03-27 LAB — LIPASE, BLOOD: Lipase: 35 U/L (ref 11–51)

## 2017-03-27 MED ORDER — LACTULOSE 10 GM/15ML PO SOLN: 30.0000 g | Freq: Two times a day (BID) | ORAL | 1 refills | Status: DC

## 2017-03-27 NOTE — Care Management Note (Signed)
Case Management Note  Patient Details  Name: Linda Vaughn MRN: [id number] Date of Birth: 02-05-1965  Subjective/Objective:         Acute metabolic encephalopathy.          Jacqulyn Caneorothy Owens (Sister) Wonda HornerBruce Dixon (Sgo)    [phone]     PCP: Julianne HandlerLaChina Hollis  Action/Plan: Transition to home today. Pt declined home health services. States husband will manage her care if needed once d/c.  Husband to provide transportation to home.  Expected Discharge Date:  03/27/17               Expected Discharge Plan:  Home/Self Care  In-House Referral:     Discharge planning Services  CM Consult  Post Acute Care Choice:    Choice offered to:  Patient  DME Arranged:    DME Agency:     HH Arranged:  RN, Social Work, pt declined services. HH Agency:     Status of Service:  Completed, signed off  If discussed at Long Length of Stay Meetings, dates discussed:    Additional Comments:  Epifanio LeschesCole, Neville Walston Hudson, RN 03/27/2017, 12:24 PM

## 2017-03-27 NOTE — Progress Notes (Signed)
Nsg Discharge Note  Admit Date:  03/25/2017 Discharge date: 03/27/2017   Kathlee NationsJarryl D Muradyan to be D/C'd Home per MD order.  AVS completed.  Copy for chart, and copy for patient signed, and dated. Patient/caregiver able to verbalize understanding.  Discharge Medication: Allergies as of 03/27/2017      Reactions   Aspirin Other (See Comments)   Liver damage    Ibuprofen Other (See Comments)   Liver damage   Tylenol [acetaminophen] Other (See Comments)   Liver damage      Medication List    TAKE these medications   carvedilol 6.25 MG tablet Commonly known as:  COREG Take 6.25 mg by mouth 2 (two) times daily.   folic acid 1 MG tablet Commonly known as:  FOLVITE Take 1 tablet (1 mg total) by mouth daily.   lactulose 10 GM/15ML solution Commonly known as:  CHRONULAC Take 45 mLs (30 g total) by mouth 2 (two) times daily. What changed:    how much to take  how to take this  when to take this  additional instructions   midodrine 2.5 MG tablet Commonly known as:  PROAMATINE Take 2 tabs three times a day for seven days (1/26-2/1) Take 1 tab three times a day for seven days (2/2-2/8)   spironolactone 100 MG tablet Commonly known as:  ALDACTONE TAKE 1 TABLET (100 MG TOTAL) BY MOUTH DAILY.   thiamine 100 MG tablet Take 1 tablet (100 mg total) by mouth daily.   traMADol 50 MG tablet Commonly known as:  ULTRAM Take 1 tablet (50 mg total) by mouth every 6 (six) hours as needed.       Discharge Assessment: Vitals:   03/27/17 0821 03/27/17 1218  BP:    Pulse:    Resp:    Temp: 98.2 F (36.8 C) 97.7 F (36.5 C)  SpO2:     Skin clean, dry and intact without evidence of skin break down, no evidence of skin tears noted.  IV catheter discontinued intact. Site without signs and symptoms of complications - no redness or edema noted at insertion site, patient denies c/o pain - only slight tenderness at site.  Dressing with slight pressure applied.  D/c  Instructions-Education: Discharge instructions given to patient/family with verbalized understanding. D/c education completed with patient/family including follow up instructions, medication list, d/c activities limitations if indicated, with other d/c instructions as indicated by MD - patient able to verbalize understanding, all questions fully answered. Patient instructed to return to ED, call 911, or call MD for any changes in condition.  Patient escorted via WC, and D/C home via private auto.  Claus Silvestro Consuella Loselaine, RN 03/27/2017 2:11 PM

## 2017-03-28 NOTE — Discharge Summary (Signed)
Discharge Summary  Linda Vaughn:[id number] DOB: 11/10/1965  PCP: [name], FNP  Admit date: 03/25/2017 Discharge date: 03/27/2017  Time spent: > 30 mins  Recommendations for Outpatient Follow-up:  1. PCP 2. GI  Discharge Diagnoses:  Active Hospital Problems   Diagnosis Date Noted  . Acute metabolic encephalopathy 03/25/2017  . Umbilical hernia 03/25/2017  . Cirrhosis of liver (HCC) 03/25/2017  . Hypertension 03/25/2017  . Hepatitis C 03/25/2017  . Recurrent right pleural effusion 03/25/2017  . Thrombocytopenia (HCC) 03/25/2017  . Macrocytic anemia 03/25/2017  . Acute hepatic encephalopathy 03/25/2017    Resolved Hospital Problems  No resolved problems to display.    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 While in the ED, pt was noted to have more altered mentation. Pt work up was unremarkable/unchanged from previous, except for elevated ammonia level. Pt was admitted for hepatic encephalopathy (pt probably not compliant to lactulose). Of note, pt was discharged on 1/26 after an admission for SBP with associated acute hepatic encephalopathy, culture was negative and pt discharged on Vantin. GI evaluated her and recommended adding rifaximin to her lactulose during hospitalization and was d/c on lactulose to follow up with her GI physicians at [facility]. During that same hospitalization surgery evaluated the hernia which was not incarcerated and was felt to be high risk surgical candidate for  elective surgical repair. Pt also has been evaluated at by Gen surg at [facility], they also noted she was a high surgical risk patient.  Patient stable to be discharged. Pt denies any worsening abdominal pain, N/V/D, denies chest pain, SOB, no fever/chills noted. Pt is AAO X 3.     Hospital Course:  Principal Problem:   Acute metabolic encephalopathy Active Problems:   Umbilical hernia   Cirrhosis of liver (HCC)   Hypertension   Hepatitis C   Recurrent right pleural effusion   Thrombocytopenia (HCC)   Macrocytic anemia   Acute hepatic encephalopathy  Hepatic encephalopathy with hx of cirrhosis, +Hepatitis C Improving, AAO X 3 Likely due to non-compliance to lactulose Vs dehydration MELD-Na score 16 on admission Ammonia elevated from baseline 134, trended down LFTs with T.bili up to 2.5 from 1.6 w/ normal AST/ALT  CT head without contrast:negative CT abdomen with small ascites, no need for paracentesis (afebrile, no WBC), no suspicion of SBP S/P gentle IVF, will stop as pt is tolerating orally Pt at home on ribavirin + epclusa, unavailable at our pharmacy, continue at home Continue lactulose to 2-3 soft stools/day, continue aldactone  Chronic complex septated umbilical hernia Denies any current worsening abd pain CT abdomen and pelvis: Stable exam without new or substantially progressive interval Finding. Cirrhotic liver morphology with small to moderate volume ascites. Similar appearance of umbilical/paraumbilical hernia containing loculated fluid. Small right pleural effusion Evaluated by [facility] surg both here at [facility], pt high risk elective surgical candidate  Thrombocytopenia/macrocytic anemia Stable Secondary to cirrhosis/alchol abuse Continue FA, thiamine  Hypertension/??Hx of Orthostatic hypotension Stable Continue  home carvedilol, Midodrine  Recurrent right pleural effusion Likely due to chronic cirrhosis, asymptomatic  ?Polysubstance abuse UDS +  for THC, cocaine Pt denies any current use Counseled pt      Procedures:  None  Consultations:  None  Discharge Exam: BP 119/77   Pulse 71   Temp 97.7 F (36.5 C) (Oral)   Resp 17   Ht 5' (1.524 m)   Wt 91.5 kg (201 lb 11.2 oz)   SpO2 92%   BMI 39.39 kg/m   General: AAO x3 Cardiovascular: S1-S2 present Respiratory: Chest clear to auscultation bilaterally ABD: Soft, nontender, mildly distended, positive large/reducible umbilical hernia, bowel sounds present  Discharge Instructions You were cared for by a hospitalist during your hospital stay. If you have any questions about your discharge medications or the care you received while you were in the hospital after you are discharged, you can call the unit and asked to speak with the hospitalist on call if the hospitalist that took care of you is not available. Once you are discharged, your primary care physician will handle any further medical issues. Please note that NO REFILLS for any discharge medications will be authorized once you are discharged, as it is imperative that you return to your primary care physician (or establish a relationship with a primary care physician if you do not have one) for your aftercare needs so that they can reassess your need for medications and monitor your lab values.   Allergies as of 03/27/2017      Reactions   Aspirin Other (See Comments)   Liver damage    Ibuprofen Other (See Comments)   Liver damage   Tylenol [acetaminophen] Other (See Comments)   Liver damage      Medication List    TAKE these medications   carvedilol 6.25 MG tablet Commonly known as:  COREG Take 6.25 mg by mouth 2 (two) times daily.   folic acid 1 MG tablet Commonly known as:  FOLVITE Take 1 tablet (1 mg total) by mouth daily.   lactulose 10 GM/15ML solution Commonly known as:  CHRONULAC Take 45 mLs (30 g total) by mouth 2 (two) times daily. What changed:    how much to take  how to take  this  when to take this  additional instructions   midodrine 2.5 MG tablet Commonly known as:  PROAMATINE Take 2 tabs three times a day for seven days (1/26-2/1) Take 1 tab three times a day for seven days (2/2-2/8)   spironolactone 100 MG tablet Commonly known as:  ALDACTONE TAKE 1 TABLET (100 MG TOTAL) BY MOUTH DAILY.   thiamine 100 MG tablet Take 1 tablet (100 mg total) by mouth daily.   traMADol 50 MG tablet Commonly known as:  ULTRAM Take 1 tablet (50 mg total) by mouth every 6 (six) hours as needed.      Allergies  Allergen Reactions  . Aspirin Other (See Comments)    Liver damage   . Ibuprofen Other (See Comments)    Liver damage  . Tylenol [Acetaminophen] Other (See Comments)    Liver damage   Follow-up Information    [name], FNP. Schedule an appointment as soon as possible for a visit in 1 week(s).   Specialty:  Family Medicine Contact information: [address] [place] [address] [phone]            The results of significant diagnostics from this hospitalization (including imaging, microbiology, ancillary and laboratory) are  listed below for reference.    Significant Diagnostic Studies: Dg Chest 2 View  Result Date: 03/25/2017 CLINICAL DATA:  Chest pain and weakness. EXAM: CHEST - 2 VIEW COMPARISON:  Radiograph 01/27/2017 FINDINGS: Unchanged heart size and mediastinal contours. Vascular congestion is similar to prior exam. Small right pleural effusion and fluid in the minor fissure. No confluent airspace disease or pneumothorax. IMPRESSION: Small right pleural effusion.  Mild vascular congestion. Electronically Signed   By: Rubye OaksMelanie  Ehinger M.D.   On: 03/25/2017 04:39   Ct Head Wo Contrast  Result Date: 03/25/2017 CLINICAL DATA:  Mental status changes.  Multiple areas of pain. EXAM: CT HEAD WITHOUT CONTRAST TECHNIQUE: Contiguous axial images were obtained from the base of the skull through the vertex without intravenous  contrast. COMPARISON:  10/08/2015 FINDINGS: Brain: The brain shows a normal appearance without evidence of malformation, atrophy, old or acute small or large vessel infarction, mass lesion, hemorrhage, hydrocephalus or extra-axial collection. Vascular: There is atherosclerotic calcification of the major vessels at the base of the brain. Skull: Normal.  No traumatic finding.  No focal bone lesion. Sinuses/Orbits: Sinuses are clear. Orbits appear normal. Mastoids are clear. Other: None significant IMPRESSION: Normal head CT, with exception of atherosclerotic calcification of the major vessels at the base of the brain. Electronically Signed   By: Paulina FusiMark  Shogry M.D.   On: 03/25/2017 11:20   Ct Abdomen Pelvis W Contrast  Result Date: 03/25/2017 CLINICAL DATA:  Hypertension with cirrhosis.  Nausea and vomiting. EXAM: CT ABDOMEN AND PELVIS WITH CONTRAST TECHNIQUE: Multidetector CT imaging of the abdomen and pelvis was performed using the standard protocol following bolus administration of intravenous contrast. CONTRAST:  100mL ISOVUE-300 IOPAMIDOL (ISOVUE-300) INJECTION 61% COMPARISON:  03/03/2017 FINDINGS: Lower chest: Similar small right pleural effusion with dependent atelectasis in the lungs bilaterally, right greater than left. Hepatobiliary: Markedly nodular liver contour compatible with reported history of cirrhosis. No evidence for hypervascular lesion on this portal venous phase study. Gallbladder surgically absent. No intrahepatic or extrahepatic biliary dilation. Pancreas: No focal mass lesion. No dilatation of the main duct. No intraparenchymal cyst. No peripancreatic edema. Spleen: No splenomegaly. No focal mass lesion. Adrenals/Urinary Tract: No adrenal nodule or mass. Kidneys unremarkable. No evidence for hydroureter. The urinary bladder appears normal for the degree of distention. Stomach/Bowel: Stomach is nondistended. No gastric wall thickening. No evidence of outlet obstruction. Duodenum is normally  positioned as is the ligament of Treitz. No small bowel wall thickening. No small bowel dilatation. No gross colonic mass. No colonic wall thickening. No substantial diverticular change. Vascular/Lymphatic: There is abdominal aortic atherosclerosis without aneurysm. Portal vein and superior mesenteric vein are patent. Splenic vein is patent. Upper normal lymph nodes are seen in the hepato duodenal ligament. No pelvic sidewall lymphadenopathy. Reproductive: The uterus has normal CT imaging appearance. There is no adnexal mass. Other: Small to moderate volume intraperitoneal free fluid with omental and mesenteric edema. Musculoskeletal: Midline complex paraumbilical hernia is similar to prior with loculated fluid. Given the thin appearance of the septations, this is not felt to represent herniated small bowel. IMPRESSION: 1. Stable exam without new or substantially progressive interval finding. 2. Cirrhotic liver morphology with small to moderate volume ascites. 3. Similar appearance of umbilical/paraumbilical hernia containing loculated fluid. 4. Small right pleural effusion with right base collapse/consolidation, stable. Electronically Signed   By: Kennith CenterEric  Mansell M.D.   On: 03/25/2017 10:34   Ct Abdomen Pelvis W Contrast  Result Date: 03/03/2017 CLINICAL DATA:  52 year old female with abdominal pain. EXAM:  CT ABDOMEN AND PELVIS WITH CONTRAST TECHNIQUE: Multidetector CT imaging of the abdomen and pelvis was performed using the standard protocol following bolus administration of intravenous contrast. CONTRAST:  <See Chart> ISOVUE-300 IOPAMIDOL (ISOVUE-300) INJECTION 61% COMPARISON:  Abdominal CT dated 02/11/2017 FINDINGS: Lower chest: Partially visualized small to moderate right pleural effusion with associated partial compressive atelectasis of the right lower lobe. This finding is similar to prior CT. No intra-abdominal free air. Moderate ascites similar or slightly increased since the prior CT. Hepatobiliary:  There is morphologic changes of cirrhosis. Cholecystectomy. No intrahepatic biliary ductal dilatation. Pancreas: Unremarkable. No pancreatic ductal dilatation or surrounding inflammatory changes. Spleen: Normal in size without focal abnormality. Adrenals/Urinary Tract: Adrenal glands are unremarkable. Kidneys are normal, without renal calculi, focal lesion, or hydronephrosis. Bladder is unremarkable. Stomach/Bowel: There is colonic diverticulosis. There is thickened appearance of the descending and sigmoid colon likely related to underdistention and hepatic colopathy. Colitis is less likely. Mild edematous appearance of small-bowel loops in the upper and mid abdomen, likely related to cirrhosis and ascites. There is no bowel obstruction. Normal appendix. Vascular/Lymphatic: Mild aortoiliac atherosclerotic disease. The abdominal aorta and IVC are otherwise unremarkable. No portal venous gas. There is recanalization of the paraumbilical vein. Small gastric varices. There is no adenopathy. Reproductive: The uterus and ovaries are grossly unremarkable. Other: There is a 12 x 6 x 14 cm multiseptated umbilical hernia containing ascitic fluid similar or slightly larger compared to the prior CT. There is diffuse subcutaneous edema and anasarca. Musculoskeletal: Degenerative changes of the spine. No acute osseous pathology. IMPRESSION: 1. Cirrhosis and moderate amount of ascites similar or slightly increased compared to the prior CT. 2. No bowel obstruction. 3. Multi-septated umbilical hernia containing ascitic fluid similar or slightly increased in the interim. 4. Partially visualized small to moderate right pleural effusion. Electronically Signed   By: Elgie Collard M.D.   On: 03/03/2017 00:32    Microbiology: Recent Results (from the past 240 hour(s))  MRSA PCR Screening     Status: None   Collection Time: 03/25/17  9:56 PM  Result Value Ref Range Status   MRSA by PCR NEGATIVE NEGATIVE Final    Comment:         The GeneXpert MRSA Assay (FDA approved for NASAL specimens only), is one component of a comprehensive MRSA colonization surveillance program. It is not intended to diagnose MRSA infection nor to guide or monitor treatment for MRSA infections. Performed at [facility], [address], [place], [place] [address]      Labs: Basic Metabolic Panel: Recent Labs  Lab 03/25/17 0358 03/26/17 0658 03/27/17 0407  NA 137 136 132*  K 4.0 3.9 4.0  CL 108 107 102  CO2 24 23 23   GLUCOSE 119* 85 92  BUN 13 11 10   CREATININE 0.76 0.75 0.76  CALCIUM 8.6* 8.3* 8.4*   Liver Function Tests: Recent Labs  Lab 03/25/17 0945 03/26/17 0658  AST 39 36  ALT 18 18  ALKPHOS 146* 109  BILITOT 2.5* 2.5*  PROT 8.4* 7.5  ALBUMIN 2.4* 2.1*   Recent Labs  Lab 03/27/17 0407  LIPASE 35   Recent Labs  Lab 03/25/17 0943 03/26/17 1219 03/27/17 0407  AMMONIA 134* 72* 83*   CBC: Recent Labs  Lab 03/25/17 0401 03/26/17 0658 03/27/17 0407  WBC 5.4 5.5 5.0  NEUTROABS 1.6* 1.6* 1.2*  HGB 10.7* 10.6* 10.7*  HCT 31.2* 30.6* 31.0*  MCV 101.3* 102.3* 103.0*  PLT 143* 143* 134*   Cardiac Enzymes:  No results for input(s): CKTOTAL, CKMB, CKMBINDEX, TROPONINI in the last 168 hours. BNP: BNP (last 3 results) No results for input(s): BNP in the last 8760 hours.  ProBNP (last 3 results) No results for input(s): PROBNP in the last 8760 hours.  CBG: Recent Labs  Lab 03/25/17 0902  GLUCAP 91       Signed:  Briant Cedar, MD [facility] 03/28/2017, 4:19 PM

## 2017-04-10 ENCOUNTER — Other Ambulatory Visit: Payer: Self-pay | Admitting: Family Medicine

## 2017-04-19 ENCOUNTER — Inpatient Hospital Stay (HOSPITAL_COMMUNITY)
Admission: EM | Admit: 2017-04-19 | Discharge: 2017-04-22 | DRG: 433 | Disposition: A | Discharge status: Home / Self Care | Payer: Medicaid Other | Attending: Family Medicine | Admitting: Family Medicine

## 2017-04-19 ENCOUNTER — Encounter (HOSPITAL_COMMUNITY): Payer: Self-pay | Admitting: Family Medicine

## 2017-04-19 ENCOUNTER — Emergency Department (HOSPITAL_COMMUNITY): Payer: Medicaid Other

## 2017-04-19 DIAGNOSIS — F17210 Nicotine dependence, cigarettes, uncomplicated: Secondary | ICD-10-CM | POA: Diagnosis present

## 2017-04-19 DIAGNOSIS — R162 Hepatomegaly with splenomegaly, not elsewhere classified: Secondary | ICD-10-CM | POA: Diagnosis present

## 2017-04-19 DIAGNOSIS — D649 Anemia, unspecified: Secondary | ICD-10-CM | POA: Diagnosis present

## 2017-04-19 DIAGNOSIS — I10 Essential (primary) hypertension: Secondary | ICD-10-CM | POA: Diagnosis present

## 2017-04-19 DIAGNOSIS — Z886 Allergy status to analgesic agent status: Secondary | ICD-10-CM | POA: Diagnosis not present

## 2017-04-19 DIAGNOSIS — Z8261 Family history of arthritis: Secondary | ICD-10-CM | POA: Diagnosis not present

## 2017-04-19 DIAGNOSIS — Z9049 Acquired absence of other specified parts of digestive tract: Secondary | ICD-10-CM

## 2017-04-19 DIAGNOSIS — R109 Unspecified abdominal pain: Secondary | ICD-10-CM | POA: Diagnosis not present

## 2017-04-19 DIAGNOSIS — Z809 Family history of malignant neoplasm, unspecified: Secondary | ICD-10-CM

## 2017-04-19 DIAGNOSIS — M1990 Unspecified osteoarthritis, unspecified site: Secondary | ICD-10-CM | POA: Diagnosis present

## 2017-04-19 DIAGNOSIS — K7031 Alcoholic cirrhosis of liver with ascites: Secondary | ICD-10-CM | POA: Diagnosis present

## 2017-04-19 DIAGNOSIS — R1084 Generalized abdominal pain: Secondary | ICD-10-CM

## 2017-04-19 DIAGNOSIS — R4701 Aphasia: Secondary | ICD-10-CM | POA: Diagnosis present

## 2017-04-19 DIAGNOSIS — K7290 Hepatic failure, unspecified without coma: Secondary | ICD-10-CM | POA: Diagnosis present

## 2017-04-19 DIAGNOSIS — R4182 Altered mental status, unspecified: Secondary | ICD-10-CM

## 2017-04-19 DIAGNOSIS — D696 Thrombocytopenia, unspecified: Secondary | ICD-10-CM | POA: Diagnosis present

## 2017-04-19 DIAGNOSIS — F1010 Alcohol abuse, uncomplicated: Secondary | ICD-10-CM | POA: Diagnosis present

## 2017-04-19 DIAGNOSIS — K7682 Hepatic encephalopathy: Secondary | ICD-10-CM

## 2017-04-19 DIAGNOSIS — F329 Major depressive disorder, single episode, unspecified: Secondary | ICD-10-CM | POA: Diagnosis present

## 2017-04-19 DIAGNOSIS — B182 Chronic viral hepatitis C: Secondary | ICD-10-CM | POA: Diagnosis present

## 2017-04-19 DIAGNOSIS — Z6837 Body mass index (BMI) 37.0-37.9, adult: Secondary | ICD-10-CM | POA: Diagnosis not present

## 2017-04-19 DIAGNOSIS — E669 Obesity, unspecified: Secondary | ICD-10-CM | POA: Diagnosis present

## 2017-04-19 DIAGNOSIS — F1410 Cocaine abuse, uncomplicated: Secondary | ICD-10-CM

## 2017-04-19 DIAGNOSIS — K439 Ventral hernia without obstruction or gangrene: Secondary | ICD-10-CM | POA: Diagnosis present

## 2017-04-19 LAB — CBC
HCT: 37.1 % (ref 36.0–46.0)
Hemoglobin: 13.1 g/dL (ref 12.0–15.0)
MCH: 36.5 pg — ABNORMAL HIGH (ref 26.0–34.0)
MCHC: 35.3 g/dL (ref 30.0–36.0)
MCV: 103.3 fL — AB (ref 78.0–100.0)
PLATELETS: 170 10*3/uL (ref 150–400)
RBC: 3.59 MIL/uL — AB (ref 3.87–5.11)
RDW: 14.9 % (ref 11.5–15.5)
WBC: 5.7 10*3/uL (ref 4.0–10.5)

## 2017-04-19 LAB — I-STAT CHEM 8, ED
BUN: 14 mg/dL (ref 6–20)
CREATININE: 0.7 mg/dL (ref 0.44–1.00)
Calcium, Ion: 1.15 mmol/L (ref 1.15–1.40)
Chloride: 110 mmol/L (ref 101–111)
Glucose, Bld: 102 mg/dL — ABNORMAL HIGH (ref 65–99)
HEMATOCRIT: 41 % (ref 36.0–46.0)
HEMOGLOBIN: 13.9 g/dL (ref 12.0–15.0)
Potassium: 4.4 mmol/L (ref 3.5–5.1)
SODIUM: 142 mmol/L (ref 135–145)
TCO2: 22 mmol/L (ref 22–32)

## 2017-04-19 LAB — RAPID URINE DRUG SCREEN, HOSP PERFORMED
Amphetamines: NOT DETECTED
Barbiturates: NOT DETECTED
Benzodiazepines: NOT DETECTED
Cocaine: POSITIVE — AB
Opiates: NOT DETECTED
TETRAHYDROCANNABINOL: POSITIVE — AB

## 2017-04-19 LAB — COMPREHENSIVE METABOLIC PANEL
ALT: 25 U/L (ref 14–54)
ANION GAP: 8 (ref 5–15)
AST: 53 U/L — ABNORMAL HIGH (ref 15–41)
Albumin: 2.4 g/dL — ABNORMAL LOW (ref 3.5–5.0)
Alkaline Phosphatase: 130 U/L — ABNORMAL HIGH (ref 38–126)
BILIRUBIN TOTAL: 3.1 mg/dL — AB (ref 0.3–1.2)
BUN: 13 mg/dL (ref 6–20)
CO2: 22 mmol/L (ref 22–32)
Calcium: 9.2 mg/dL (ref 8.9–10.3)
Chloride: 107 mmol/L (ref 101–111)
Creatinine, Ser: 0.84 mg/dL (ref 0.44–1.00)
Glucose, Bld: 101 mg/dL — ABNORMAL HIGH (ref 65–99)
Potassium: 4.4 mmol/L (ref 3.5–5.1)
Sodium: 137 mmol/L (ref 135–145)
TOTAL PROTEIN: 9.4 g/dL — AB (ref 6.5–8.1)

## 2017-04-19 LAB — URINALYSIS, ROUTINE W REFLEX MICROSCOPIC
Bilirubin Urine: NEGATIVE
GLUCOSE, UA: NEGATIVE mg/dL
HGB URINE DIPSTICK: NEGATIVE
Ketones, ur: NEGATIVE mg/dL
Leukocytes, UA: NEGATIVE
Nitrite: NEGATIVE
PROTEIN: NEGATIVE mg/dL
Specific Gravity, Urine: 1.014 (ref 1.005–1.030)
pH: 7 (ref 5.0–8.0)

## 2017-04-19 LAB — DIFFERENTIAL
BASOS ABS: 0 10*3/uL (ref 0.0–0.1)
Basophils Relative: 0 %
EOS ABS: 0.1 10*3/uL (ref 0.0–0.7)
Eosinophils Relative: 2 %
LYMPHS PCT: 36 %
Lymphs Abs: 2 10*3/uL (ref 0.7–4.0)
Monocytes Absolute: 0.9 10*3/uL (ref 0.1–1.0)
Monocytes Relative: 15 %
NEUTROS ABS: 2.7 10*3/uL (ref 1.7–7.7)
NEUTROS PCT: 47 %

## 2017-04-19 LAB — PROTIME-INR
INR: 1.72
PROTHROMBIN TIME: 20 s — AB (ref 11.4–15.2)

## 2017-04-19 LAB — AMMONIA: AMMONIA: 106 umol/L — AB (ref 9–35)

## 2017-04-19 LAB — I-STAT TROPONIN, ED: TROPONIN I, POC: 0 ng/mL (ref 0.00–0.08)

## 2017-04-19 LAB — APTT: APTT: 36 s (ref 24–36)

## 2017-04-19 LAB — I-STAT BETA HCG BLOOD, ED (MC, WL, AP ONLY): I-stat hCG, quantitative: <5 m[IU]/mL (ref <5)

## 2017-04-19 MED ORDER — SODIUM CHLORIDE 0.9 % IV BOLUS
500.0000 mL | Freq: Once | INTRAVENOUS | Status: AC
  Administered 2017-04-19: 500 mL via INTRAVENOUS

## 2017-04-19 MED ORDER — TRAMADOL HCL 50 MG PO TABS
50.0000 mg | ORAL_TABLET | Freq: Four times a day (QID) | ORAL | Status: DC | PRN
  Administered 2017-04-20 (×2): 50 mg via ORAL
  Filled 2017-04-19 (×2): qty 1

## 2017-04-19 MED ORDER — IOPAMIDOL (ISOVUE-300) INJECTION 61%
INTRAVENOUS | Status: AC
  Filled 2017-04-19: qty 100

## 2017-04-19 MED ORDER — FENTANYL CITRATE (PF) 100 MCG/2ML IJ SOLN
50.0000 ug | Freq: Once | INTRAMUSCULAR | Status: AC
  Administered 2017-04-19: 50 ug via INTRAVENOUS
  Filled 2017-04-19: qty 2

## 2017-04-19 MED ORDER — SPIRONOLACTONE 100 MG PO TABS
100.0000 mg | ORAL_TABLET | Freq: Every day | ORAL | Status: DC
  Administered 2017-04-20 – 2017-04-22 (×3): 100 mg via ORAL
  Filled 2017-04-19: qty 1
  Filled 2017-04-19: qty 4
  Filled 2017-04-19 (×2): qty 1
  Filled 2017-04-19 (×2): qty 4

## 2017-04-19 MED ORDER — VITAMIN B-1 100 MG PO TABS
100.0000 mg | ORAL_TABLET | Freq: Every day | ORAL | Status: DC
  Administered 2017-04-20 – 2017-04-22 (×3): 100 mg via ORAL
  Filled 2017-04-19 (×3): qty 1

## 2017-04-19 MED ORDER — LACTULOSE 10 GM/15ML PO SOLN
30.0000 g | Freq: Two times a day (BID) | ORAL | Status: DC
  Administered 2017-04-20 – 2017-04-21 (×5): 30 g via ORAL
  Filled 2017-04-19 (×6): qty 60

## 2017-04-19 MED ORDER — IOPAMIDOL (ISOVUE-300) INJECTION 61%
100.0000 mL | Freq: Once | INTRAVENOUS | Status: AC | PRN
  Administered 2017-04-19: 100 mL via INTRAVENOUS

## 2017-04-19 MED ORDER — ONDANSETRON HCL 4 MG/2ML IJ SOLN
4.0000 mg | Freq: Four times a day (QID) | INTRAMUSCULAR | Status: DC | PRN
  Administered 2017-04-22: 4 mg via INTRAVENOUS
  Filled 2017-04-19: qty 2

## 2017-04-19 MED ORDER — FOLIC ACID 1 MG PO TABS
1.0000 mg | ORAL_TABLET | Freq: Every day | ORAL | Status: DC
  Administered 2017-04-20 – 2017-04-22 (×3): 1 mg via ORAL
  Filled 2017-04-19 (×3): qty 1

## 2017-04-19 MED ORDER — NICOTINE 14 MG/24HR TD PT24
14.0000 mg | MEDICATED_PATCH | Freq: Every day | TRANSDERMAL | Status: DC
Start: 2017-04-20 — End: 2017-04-22
  Administered 2017-04-20 – 2017-04-22 (×3): 14 mg via TRANSDERMAL
  Filled 2017-04-19 (×3): qty 1

## 2017-04-19 MED ORDER — ONDANSETRON HCL 4 MG/2ML IJ SOLN
4.0000 mg | Freq: Once | INTRAMUSCULAR | Status: AC
  Administered 2017-04-19: 4 mg via INTRAVENOUS
  Filled 2017-04-19: qty 2

## 2017-04-19 MED ORDER — ONDANSETRON HCL 4 MG PO TABS: 4.0000 mg | ORAL_TABLET | Freq: Four times a day (QID) | ORAL | Status: DC | PRN

## 2017-04-19 NOTE — ED Notes (Signed)
Pt unable to verbalize or localize pain. Pt keeps saying "ok ok ok". Pt moving all 4 extremities but not following commands to complete a true NIH. Pt tearful and given pain meds before CT scan.

## 2017-04-19 NOTE — ED Notes (Signed)
Pt asked about her pain. Pt still only saying "ok ok" but nodded head that she was in less pain.  Pt given several warm blankets.

## 2017-04-19 NOTE — ED Triage Notes (Addendum)
Per EMS- possible psych, called out by bf for altered, no visible neuro deficits" CBG 90   Upon traige assessment- pt clearly with expressive aphasia. Called boyfriend, he states she was normal and talking and walking fine Friday night at 11pm when she went to bed. He got home Saturday at 3pm and noted she had repetitive speech and could not talk, also was having difficulty walking. Pt can only say "okay." and "yea" at present. Large abdominal hernia noted. Boyfriend states it has grown and she has a CT scheduled with [facility] for this. *Pt also has hx of cirrhosis. Obtaining ammonia at triage with neuro order set. Crying at triage. When asked if she is having trouble talking the pt said "Yea".

## 2017-04-19 NOTE — ED Notes (Signed)
Pt has taken all of her monitoring equipment off. RN attempted to place monitoring equipment back on Pt, but unsuccessful.

## 2017-04-19 NOTE — ED Notes (Signed)
Bug found on Pt. Collected and shown to Charge. Upon inspection does not appear to be bed bug. Bug has wings and more of a long and segmented Thorax.

## 2017-04-19 NOTE — ED Notes (Signed)
Pt provided with bedside commode. Ambulated w/ assistance.

## 2017-04-19 NOTE — ED Notes (Signed)
[place]ruce boyfriend [phone]
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
## 2017-04-19 NOTE — ED Notes (Signed)
Pt now saying " Please help me, I am cold" pt trying to crawl out of the stretcher. RN Marijean NiemannJaime and Eulis FosterAlberto EMT placed Pt back in stretcher and given more warm blankets.

## 2017-04-20 ENCOUNTER — Other Ambulatory Visit: Payer: Self-pay

## 2017-04-20 ENCOUNTER — Inpatient Hospital Stay (HOSPITAL_COMMUNITY): Payer: Medicaid Other

## 2017-04-20 ENCOUNTER — Encounter (HOSPITAL_COMMUNITY): Payer: Self-pay | Admitting: Interventional Radiology

## 2017-04-20 HISTORY — PX: IR PARACENTESIS: IMG2679

## 2017-04-20 LAB — COMPREHENSIVE METABOLIC PANEL
ALT: 20 U/L (ref 14–54)
ANION GAP: 8 (ref 5–15)
AST: 41 U/L (ref 15–41)
Albumin: 2 g/dL — ABNORMAL LOW (ref 3.5–5.0)
Alkaline Phosphatase: 97 U/L (ref 38–126)
BUN: 11 mg/dL (ref 6–20)
CHLORIDE: 107 mmol/L (ref 101–111)
CO2: 21 mmol/L — ABNORMAL LOW (ref 22–32)
Calcium: 8.3 mg/dL — ABNORMAL LOW (ref 8.9–10.3)
Creatinine, Ser: 0.75 mg/dL (ref 0.44–1.00)
Glucose, Bld: 167 mg/dL — ABNORMAL HIGH (ref 65–99)
POTASSIUM: 3.3 mmol/L — AB (ref 3.5–5.1)
Sodium: 136 mmol/L (ref 135–145)
Total Bilirubin: 2.4 mg/dL — ABNORMAL HIGH (ref 0.3–1.2)
Total Protein: 7.6 g/dL (ref 6.5–8.1)

## 2017-04-20 LAB — CBC
HEMATOCRIT: 30.3 % — AB (ref 36.0–46.0)
Hemoglobin: 10.4 g/dL — ABNORMAL LOW (ref 12.0–15.0)
MCH: 36.4 pg — ABNORMAL HIGH (ref 26.0–34.0)
MCHC: 34.3 g/dL (ref 30.0–36.0)
MCV: 105.9 fL — AB (ref 78.0–100.0)
PLATELETS: 154 10*3/uL (ref 150–400)
RBC: 2.86 MIL/uL — AB (ref 3.87–5.11)
RDW: 15.4 % (ref 11.5–15.5)
WBC: 6 10*3/uL (ref 4.0–10.5)

## 2017-04-20 LAB — MAGNESIUM: MAGNESIUM: 1.7 mg/dL (ref 1.7–2.4)

## 2017-04-20 LAB — BODY FLUID CELL COUNT WITH DIFFERENTIAL
Eos, Fluid: 0 %
Lymphs, Fluid: 41 %
Monocyte-Macrophage-Serous Fluid: 50 % (ref 50–90)
Neutrophil Count, Fluid: 9 % (ref 0–25)
WBC FLUID: 330 uL (ref 0–1000)

## 2017-04-20 LAB — PROTIME-INR
INR: 1.91
Prothrombin Time: 21.7 seconds — ABNORMAL HIGH (ref 11.4–15.2)

## 2017-04-20 LAB — GLUCOSE, PLEURAL OR PERITONEAL FLUID: Glucose, Fluid: 126 mg/dL

## 2017-04-20 LAB — LACTATE DEHYDROGENASE, PLEURAL OR PERITONEAL FLUID: LD FL: 57 U/L — AB (ref 3–23)

## 2017-04-20 LAB — ALBUMIN, PLEURAL OR PERITONEAL FLUID: Albumin, Fluid: <1 g/dL

## 2017-04-20 LAB — PROTEIN, PLEURAL OR PERITONEAL FLUID

## 2017-04-20 LAB — GRAM STAIN

## 2017-04-20 LAB — GLUCOSE, CAPILLARY: GLUCOSE-CAPILLARY: 124 mg/dL — AB (ref 65–99)

## 2017-04-20 LAB — APTT: aPTT: 38 seconds — ABNORMAL HIGH (ref 24–36)

## 2017-04-20 LAB — AMMONIA: Ammonia: 144 umol/L — ABNORMAL HIGH (ref 9–35)

## 2017-04-20 MED ORDER — CEFTRIAXONE SODIUM 1 G IJ SOLR
1.0000 g | INTRAMUSCULAR | Status: DC
Start: 2017-04-20 — End: 2017-04-22
  Administered 2017-04-20 – 2017-04-21 (×2): 1 g via INTRAVENOUS
  Filled 2017-04-20 (×3): qty 10

## 2017-04-20 MED ORDER — LORAZEPAM 1 MG PO TABS
1.0000 mg | ORAL_TABLET | Freq: Four times a day (QID) | ORAL | Status: DC | PRN
  Administered 2017-04-20: 1 mg via ORAL
  Filled 2017-04-20: qty 1

## 2017-04-20 MED ORDER — IBUPROFEN 200 MG PO TABS
400.0000 mg | ORAL_TABLET | Freq: Four times a day (QID) | ORAL | Status: DC | PRN
  Administered 2017-04-20: 400 mg via ORAL
  Filled 2017-04-20: qty 2

## 2017-04-20 MED ORDER — LIDOCAINE HCL 2 % IJ SOLN
INTRAMUSCULAR | Status: AC | PRN
  Administered 2017-04-20: 10 mL

## 2017-04-20 MED ORDER — MAGNESIUM SULFATE 2 GM/50ML IV SOLN
2.0000 g | Freq: Once | INTRAVENOUS | Status: DC
  Filled 2017-04-20: qty 50

## 2017-04-20 MED ORDER — LIDOCAINE HCL (PF) 2 % IJ SOLN
INTRAMUSCULAR | Status: AC
  Filled 2017-04-20: qty 20

## 2017-04-20 MED ORDER — KETOROLAC TROMETHAMINE 15 MG/ML IJ SOLN
15.0000 mg | Freq: Once | INTRAMUSCULAR | Status: AC
  Administered 2017-04-20: 15 mg via INTRAVENOUS
  Filled 2017-04-20: qty 1

## 2017-04-20 MED ORDER — POTASSIUM CHLORIDE CRYS ER 20 MEQ PO TBCR
40.0000 meq | EXTENDED_RELEASE_TABLET | Freq: Once | ORAL | Status: AC
  Administered 2017-04-20: 40 meq via ORAL
  Filled 2017-04-20: qty 2

## 2017-04-20 MED ORDER — ADULT MULTIVITAMIN W/MINERALS CH
1.0000 | ORAL_TABLET | Freq: Every day | ORAL | Status: DC
  Administered 2017-04-20 – 2017-04-22 (×3): 1 via ORAL
  Filled 2017-04-20 (×3): qty 1

## 2017-04-20 NOTE — Discharge Summary (Signed)
Family Medicine Teaching [facility] Discharge Summary  Patient name: Linda Vaughn Medical record number: [id number] Date of birth: 1965-07-07 Age: 52 y.o. Gender: female Date of Admission: 04/19/2017  Date of Discharge: 04/22/2017 Admitting Physician: Latrelle Dodrill, MD  Primary Care Provider: [name], FNP Consultants: IR   Indication for Hospitalization: AMS, abdominal pain  Discharge Diagnoses/Problem List:  AMS, improved Abdominal pain, improved Anemia/thrombocytopenia, stable Cirrhosis d/t Hep C, alcohol use, stable HTN, stable Depression, stable Polysubstance use  Disposition: Home  Discharge Condition: Improved  Discharge Exam:  General: sitting in bed, A&O, in NAD  Cardiovascular: RRR, no mrg Respiratory: CTAB, normal WOB on RA Abdomen: soft, nondistended, ventral herna decreased in size from previous. Slightly tender to palpation around hernia base, otherwise nontender. 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 In the ED, UDS positive for cocaine and THC. CT Head was negative for acute abnormalities and no focal neuro deficits to suggest stroke process. Ammonia was elevated consistent with hepatic encephalopathy. Abdominal CT obtained due to ventral hernia and worsened abdominal pain, however was negative for bowel herniation or incarceration. Home lactulose was continued which subsequently improved mental status. Patient underwent diagnostic paracentesis given worsened abdominal pain and history of SBP which did not show evidence of bacterial growth. Patient treated empirically for 2  days with Rocephin until cultures resulted. Abdominal pain did improve however with therapeutic drainage of ~1L. Patient continued to endorse chronic abdominal pain associated with ventral hernia, currently undergoing elective surgical evaluation as an outpatient.  Issues for Follow Up:  1. Medication Changes: 1. Added Lasix 40mg  for additional benefit in preventing cirrhotic ascites. 2. Held coreg in the setting of cocaine use. Counseled on danger of use of BB with cocaine use. Normotensive after discontinuing, did not continue on discharge. 2. Patient currently undergoing surgical evaluation for elective removal of ventral hernia.  Significant Procedures: Diagnostic and therapeutic paracentesis  Significant Labs and Imaging:  Recent Labs  Lab 04/20/17 0402 04/21/17 0452 04/22/17 0523  WBC 6.0 4.8 4.8  HGB 10.4* 10.1* 11.2*  HCT 30.3* 29.4* 32.5*  PLT 154 143* 163   Recent Labs  Lab 04/19/17 1612 04/19/17 1630 04/20/17 0402 04/21/17 0452 04/22/17 0523  NA 142 137 136 136 130*  K 4.4 4.4 3.3* 4.0 4.1  CL 110 107 107 105 100*  CO2  --  22 21* 25 21*  GLUCOSE 102* 101* 167* 80 142*  BUN 14 13 11 14 13   CREATININE 0.70 0.84 0.75 1.07* 0.95  CALCIUM  --  9.2 8.3* 8.2* 8.3*  MG  --   --  1.7 1.9  --   ALKPHOS  --  130* 97  --   --   AST  --  53* 41  --   --   ALT  --  25 20  --   --   ALBUMIN  --  2.4* 2.0*  --   --    Ct Head Wo Contrast  Result Date: 04/19/2017 CLINICAL DATA:  Altered level of consciousness EXAM: CT HEAD WITHOUT CONTRAST TECHNIQUE: Contiguous axial images were obtained from the base of the skull through the vertex without intravenous  contrast. Sagittal and coronal MPR images reconstructed from axial data set. COMPARISON:  03/25/2017 FINDINGS: Brain: Normal ventricular morphology. No midline shift or mass effect. Normal appearance of brain parenchyma. No intracranial hemorrhage, mass lesion, evidence of acute infarction, or extra-axial fluid collection.  Vascular: No hyperdense lesions. Minimal internal carotid arterial calcifications at skull base Skull: Intact Sinuses/Orbits: Clear Other: N/A IMPRESSION: Minimal atherosclerotic calcification of internal carotid arteries at skull base. Otherwise normal exam. Electronically Signed   By: Ulyses SouthwardMark  Boles M.D.   On: 04/19/2017 17:14   Ct Abdomen Pelvis W Contrast  Result Date: 04/19/2017 CLINICAL DATA:  Patient's large periumbilical hernia has increased in size. Further evaluation requested. EXAM: CT ABDOMEN AND PELVIS WITH CONTRAST TECHNIQUE: Multidetector CT imaging of the abdomen and pelvis was performed using the standard protocol following bolus administration of intravenous contrast. CONTRAST:  50mL ISOVUE-300 IOPAMIDOL (ISOVUE-300) INJECTION 61% COMPARISON:  CT of the abdomen and pelvis from 03/25/2017 FINDINGS: Lower chest: A small right pleural effusion is again noted, with associated atelectasis. The visualized portions of the mediastinum are unremarkable. Hepatobiliary: The nodular contour of the liver is compatible with hepatic cirrhosis. Evaluation is somewhat suboptimal due to limitations in contrast enhancement. The patient is status post cholecystectomy, with clips noted at the gallbladder fossa. There is recanalization of the umbilical vein. Pancreas: The pancreas is within normal limits. Spleen: The spleen is unremarkable in appearance. Adrenals/Urinary Tract: The adrenal glands are unremarkable in appearance. The kidneys are within normal limits. There is no evidence of hydronephrosis. No renal or ureteral stones are identified. No perinephric stranding is seen. Stomach/Bowel: The stomach is unremarkable in appearance. The small bowel is within normal limits. The appendix is not visualized; there is no evidence for appendicitis. The colon is unremarkable in appearance. Vascular/Lymphatic: Scattered calcification is seen along the abdominal aorta and its branches. The abdominal aorta is otherwise  grossly unremarkable. The inferior vena cava is grossly unremarkable. No retroperitoneal lymphadenopathy is seen. No pelvic sidewall lymphadenopathy is identified. Reproductive: The bladder is mildly distended and grossly unremarkable. The uterus is unremarkable in appearance the ovaries are grossly symmetric. No suspicious adnexal masses are seen. Other: Small to moderate volume ascites is again noted within the abdomen and pelvis, perhaps slightly increased from the prior study. A moderate to large periumbilical hernia is again noted, containing loculated fluid. This is perhaps slightly increased from the prior study. There is no evidence of bowel herniation. Musculoskeletal: No acute osseous abnormalities are identified. The visualized musculature is unremarkable in appearance. IMPRESSION: 1. Moderate to large periumbilical hernia again noted, containing loculated fluid. This is perhaps slightly increased from the prior study. No evidence of bowel herniation. 2. Small to moderate volume ascites within the abdomen and pelvis is perhaps slightly increased from the prior study. 3. Findings of hepatic cirrhosis. Recanalization of the umbilical vein. 4. Small right pleural effusion, with associated atelectasis. Aortic Atherosclerosis (ICD10-I70.0). Electronically Signed   By: Roanna RaiderJeffery  Chang M.D.   On: 04/19/2017 21:32   Ir Paracentesis  Result Date: 04/20/2017 INDICATION: Cirrhosis, hepatitis-C, ascites. Request for diagnostic and therapeutic paracentesis. EXAM: ULTRASOUND GUIDED PARACENTESIS MEDICATIONS: 2% Lidocaine = 10 mL COMPLICATIONS: None immediate. PROCEDURE: Informed written consent was obtained from the patient after a discussion of the risks, benefits and alternatives to treatment. A timeout was performed prior to the initiation of the procedure. Initial ultrasound scanning demonstrates a moderate amount of ascites within the right lower abdominal quadrant. The right lower abdomen was prepped and draped  in the usual sterile  fashion. 1% lidocaine with epinephrine was used for local anesthesia. Following this, a 19 gauge, 7-cm, Yueh catheter was introduced. An ultrasound image was saved for documentation purposes. The paracentesis was performed. The catheter was removed and a dressing was applied. The patient tolerated the procedure well without immediate post procedural complication. FINDINGS: A total of approximately 1.3 liters of clear yellow fluid was removed. Samples were sent to the laboratory as requested by the clinical team. IMPRESSION: Successful ultrasound-guided paracentesis yielding 1.3 liters of peritoneal fluid. Read by: Corrin Parker, PA-C Electronically Signed   By: Simonne Come M.D.   On: 04/20/2017 11:31    Results/Tests Pending at Time of Discharge: None  Discharge Medications:  Allergies as of 04/22/2017      Reactions   Aspirin Other (See Comments)   Liver damage    Ibuprofen Other (See Comments)   Liver damage   Tylenol [acetaminophen] Other (See Comments)   Liver damage      Medication List    STOP taking these medications   carvedilol 6.25 MG tablet Commonly known as:  COREG     TAKE these medications   folic acid 1 MG tablet Commonly known as:  FOLVITE TAKE 1 TABLET BY MOUTH EVERY DAY   furosemide 40 MG tablet Commonly known as:  LASIX Take 1 tablet (40 mg total) by mouth daily. Start taking on:  04/23/2017   lactulose 10 GM/15ML solution Commonly known as:  CHRONULAC Take 45 mLs (30 g total) by mouth 2 (two) times daily.   spironolactone 100 MG tablet Commonly known as:  ALDACTONE Take 1 tablet (100 mg total) by mouth daily. Start taking on:  04/23/2017   thiamine 100 MG tablet Take 1 tablet (100 mg total) by mouth daily.   traMADol 50 MG tablet Commonly known as:  ULTRAM Take 1 tablet (50 mg total) by mouth every 6 (six) hours as needed.       Discharge Instructions: Please refer to Patient Instructions section of EMR for full details.  Patient  was counseled important signs and symptoms that should prompt return to medical care, changes in medications, dietary instructions, activity restrictions, and follow up appointments.   Follow-Up Appointments: Follow-up Information    [name], FNP. Schedule an appointment as soon as possible for a visit.   Specialty:  Family Medicine Contact information: [address] [place] [address] [phone]           Ellwood Dense, DO 04/22/2017, 11:16 AM PGY-1, [facility]

## 2017-04-20 NOTE — Progress Notes (Signed)
Late entry for 0100: patient arrived to floor via stretcher, very confused, but able to tell me who she was and her birthdate. Remembers my name. She was complaining of pain 10/10 and I administered tramadol, with little effect.

## 2017-04-20 NOTE — Progress Notes (Signed)
Patient pulled IV out and refused a new IV. All IV antibiotics was not given for today.

## 2017-04-20 NOTE — Progress Notes (Addendum)
Family Medicine Teaching Service Daily Progress Note Intern Pager: [phone]  Patient name: Linda Vaughn Medical record number: [id number] Date of birth: 1965/04/06 Age: 52 y.o. Gender: female  Primary Care Provider: [name], FNP Consultants: None Code Status: Full  Pt Overview and Major Events to Date:  4/14 - admitted for abdominal pain, AMS  Assessment and Plan: Linda Vaughn is a 52 y.o. female presenting with 2-3 days of abdominal pain and worsening confusion for the past day. 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  AMS, acute, improving Likely 2/2 hepatic encephalopathy. CIWAs 10, 11 overnight requiring 1mg  Ativan early this morning. Received home dose of Lactulose early this morning. No BM yet. Mental status exam convoluted by Ativan received a few hours prior, will recheck this afternoon. Ammonia this am elevated from admission 106>144. - Continue home lactulose 30 g BID - am PT/INR, CBC, CMP - strict I&Os - vitals per floor routine - reevaluate this afternoon  Abdominal Pain w/ complex loculated ventral hernia, chronic, improved Pain improved from admission. Received one dose fentanyl in the ED, 2 doses PRN tramadol overnight. Exam this morning consistent with fluid filled hernia, not reducible. Continues to be afebrile without leukocytosis and with stable vitals. Will consult IR given pain, distension and previous h/o SBP. Previously evaluated by Surgery at prior admission as well as at [facility] and felt to be high risk surgical candidate. Currently undergoing scheduling for surgery eval for removal at [facility].  - Tramadol PRN for pain - Srict I&Os - Consult IR for diagnosis paracentesis - Could consider surgical consult, although currently undergoing eval for elective surgery  Anemia and thrombocytopenia, chronic Patient has a history of anemia and thrombocytopenia in  the setting of liver failure. Baseline Hbg between 9-11 recently. On presentation 13.1 likely from volume contraction, back down to baseline overnight. MCV elevated consistent with Macrocytosis in the setting of poor diet with history of alcohol abuse.  - daily CBC - Continue thiamine and folate  Hep C, Chronic Patient was recently treated with ribavirin, and sofosbuvir-velpatasvir in the fall of 2018. Completed treatment. MELD 17 with ascites and hepatosplenomegaly.  - Will continue to monitor  HTN Normotensive after receiving home medication and pain control. On coreg, spironolactone at home. UDS was positive for cocaine and THC. - Hold coreg given cocaine use - Continue spironolactone 100 mg daily  Depression Previously on Cymbalta, discontinued at last hospitalization. No other daily controller medication. - Will continue to monitor when patient is more stable  Polysubstance Use CIWAs elevated overnight to 10, 11, received one dose of PRN Ativan. Endorses daily alcohol use. Denies any other substance use, however UDS on admission positive for THC and cocaine. Patient also reports tobacco abuse and states she smokes two packs a day. On further review of her record appears patient has cut down significantly on her smoking. Given AMS, will reevaluate when patient is more alert. - CIWA - nicotine patch - Mag level - folate, thiamine supplementation  FEN/GI: Heart Healthy Prophylaxis: SCDs  Disposition: continue inpatient management of AMS, abdominal pain  Subjective:  Patient states pain is better this morning. Mental status difficult to evaluate this morning given Ativan received a few hours prior to exam.  Objective: Temp:  [98 F (36.7 C)-98.5 F (36.9 C)] 98 F (36.7 C) (04/15 0811) Pulse Rate:  [65-87] 79 (04/15 0811) Resp:  [12-39] 18 (04/15 0811) BP: (109-145)/(69-109) 112/73 (04/15  [id number]) SpO2:  [97 %-100 %] 98 % (04/15 0811) Weight:  [194 lb 14.2 oz (88.4 kg)]  194 lb 14.2 oz (88.4 kg) (04/15 0100) Physical Exam: General: lying in bed, in NAD Cardiovascular: RRR, no mrg Respiratory: CTAB, normal WOB on RA Abdomen: soft, tender to palpation diffusely, more around hernia. Hernia not reducible, loculations appreciated and feels fluid-filled. Distended. Extremities: warm and well perfused, no LE edema.  Laboratory: Recent Labs  Lab 04/19/17 1612 04/19/17 1630 04/20/17 0402  WBC  --  5.7 6.0  HGB 13.9 13.1 10.4*  HCT 41.0 37.1 30.3*  PLT  --  170 154   Recent Labs  Lab 04/19/17 1612 04/19/17 1630 04/20/17 0402  NA 142 137 136  K 4.4 4.4 3.3*  CL 110 107 107  CO2  --  22 21*  BUN 14 13 11   CREATININE 0.70 0.84 0.75  CALCIUM  --  9.2 8.3*  PROT  --  9.4* 7.6  BILITOT  --  3.1* 2.4*  ALKPHOS  --  130* 97  ALT  --  25 20  AST  --  53* 41  GLUCOSE 102* 101* 167*    Imaging/Diagnostic Tests: Ct Head Wo Contrast  Result Date: 04/19/2017 CLINICAL DATA:  Altered level of consciousness EXAM: CT HEAD WITHOUT CONTRAST TECHNIQUE: Contiguous axial images were obtained from the base of the skull through the vertex without intravenous contrast. Sagittal and coronal MPR images reconstructed from axial data set. COMPARISON:  03/25/2017 FINDINGS: Brain: Normal ventricular morphology. No midline shift or mass effect. Normal appearance of brain parenchyma. No intracranial hemorrhage, mass lesion, evidence of acute infarction, or extra-axial fluid collection. Vascular: No hyperdense lesions. Minimal internal carotid arterial calcifications at skull base Skull: Intact Sinuses/Orbits: Clear Other: N/A IMPRESSION: Minimal atherosclerotic calcification of internal carotid arteries at skull base. Otherwise normal exam. Electronically Signed   By: Ulyses SouthwardMark  Boles M.D.   On: 04/19/2017 17:14   Ct Abdomen Pelvis W Contrast  Result Date: 04/19/2017 CLINICAL DATA:  Patient's large periumbilical hernia has increased in size. Further evaluation requested. EXAM: CT  ABDOMEN AND PELVIS WITH CONTRAST TECHNIQUE: Multidetector CT imaging of the abdomen and pelvis was performed using the standard protocol following bolus administration of intravenous contrast. CONTRAST:  50mL ISOVUE-300 IOPAMIDOL (ISOVUE-300) INJECTION 61% COMPARISON:  CT of the abdomen and pelvis from 03/25/2017 FINDINGS: Lower chest: A small right pleural effusion is again noted, with associated atelectasis. The visualized portions of the mediastinum are unremarkable. Hepatobiliary: The nodular contour of the liver is compatible with hepatic cirrhosis. Evaluation is somewhat suboptimal due to limitations in contrast enhancement. The patient is status post cholecystectomy, with clips noted at the gallbladder fossa. There is recanalization of the umbilical vein. Pancreas: The pancreas is within normal limits. Spleen: The spleen is unremarkable in appearance. Adrenals/Urinary Tract: The adrenal glands are unremarkable in appearance. The kidneys are within normal limits. There is no evidence of hydronephrosis. No renal or ureteral stones are identified. No perinephric stranding is seen. Stomach/Bowel: The stomach is unremarkable in appearance. The small bowel is within normal limits. The appendix is not visualized; there is no evidence for appendicitis. The colon is unremarkable in appearance. Vascular/Lymphatic: Scattered calcification is seen along the abdominal aorta and its branches. The abdominal aorta is otherwise grossly unremarkable. The inferior vena cava is grossly unremarkable. No retroperitoneal lymphadenopathy is seen. No pelvic sidewall lymphadenopathy is identified. Reproductive: The bladder is mildly distended and grossly unremarkable. The uterus is unremarkable in appearance the ovaries are grossly symmetric.  No suspicious adnexal masses are seen. Other: Small to moderate volume ascites is again noted within the abdomen and pelvis, perhaps slightly increased from the prior study. A moderate to large  periumbilical hernia is again noted, containing loculated fluid. This is perhaps slightly increased from the prior study. There is no evidence of bowel herniation. Musculoskeletal: No acute osseous abnormalities are identified. The visualized musculature is unremarkable in appearance. IMPRESSION: 1. Moderate to large periumbilical hernia again noted, containing loculated fluid. This is perhaps slightly increased from the prior study. No evidence of bowel herniation. 2. Small to moderate volume ascites within the abdomen and pelvis is perhaps slightly increased from the prior study. 3. Findings of hepatic cirrhosis. Recanalization of the umbilical vein. 4. Small right pleural effusion, with associated atelectasis. Aortic Atherosclerosis (ICD10-I70.0). Electronically Signed   By: Roanna Raider M.D.   On: 04/19/2017 21:32   Ellwood Dense, DO 04/20/2017, 9:50 AM PGY-1, [facility] FPTS Intern pager: [phone], text pages welcome

## 2017-04-20 NOTE — Progress Notes (Signed)
Paged [facility] regarding patient possibly needing something for withdrawal. Returned Page right away. Awaiting orders.

## 2017-04-20 NOTE — Procedures (Signed)
PROCEDURE SUMMARY:  Successful US guided paracentesis from right lateral abdomen.  Yielded 1.3 liters of clear yellow fluid.  No immediate complications.  Patient tolerated well.   Specimen was sent for labs.  [name] PA-C 04/20/2017 11:33 AM

## 2017-04-20 NOTE — ED Notes (Signed)
Admitting Rumball, DO paged; waiting for return call

## 2017-04-20 NOTE — Care Management Note (Signed)
Case Management Note  Patient Details  Name: Linda Vaughn MRN: [id number] Date of Birth: Aug 31, 1965  Subjective/Objective:      Pt admitted with hepatic encephalopathy. She is from home.               Action/Plan: Plan is for her to return home when medically stable. CM following for d/c needs, physician orders.  Expected Discharge Date:                  Expected Discharge Plan:  Home/Self Care  In-House Referral:     Discharge planning Services     Post Acute Care Choice:    Choice offered to:     DME Arranged:    DME Agency:     HH Arranged:    HH Agency:     Status of Service:  In process, will continue to follow  If discussed at Long Length of Stay Meetings, dates discussed:    Additional Comments:  [name], RN 04/20/2017, 9:13 AM

## 2017-04-20 NOTE — ED Notes (Signed)
Attempted to call report; Asher MuirJamie is unable to take report at this time. She will call me back.

## 2017-04-21 DIAGNOSIS — R109 Unspecified abdominal pain: Secondary | ICD-10-CM

## 2017-04-21 LAB — BASIC METABOLIC PANEL
Anion gap: 6 (ref 5–15)
BUN: 14 mg/dL (ref 6–20)
CHLORIDE: 105 mmol/L (ref 101–111)
CO2: 25 mmol/L (ref 22–32)
CREATININE: 1.07 mg/dL — AB (ref 0.44–1.00)
Calcium: 8.2 mg/dL — ABNORMAL LOW (ref 8.9–10.3)
GFR calc Af Amer: >60 mL/min (ref >60)
GFR calc non Af Amer: 58 mL/min — ABNORMAL LOW (ref >60)
Glucose, Bld: 80 mg/dL (ref 65–99)
POTASSIUM: 4 mmol/L (ref 3.5–5.1)
SODIUM: 136 mmol/L (ref 135–145)

## 2017-04-21 LAB — PH, BODY FLUID: pH, Body Fluid: 7.6

## 2017-04-21 LAB — CBC
HEMATOCRIT: 29.4 % — AB (ref 36.0–46.0)
HEMOGLOBIN: 10.1 g/dL — AB (ref 12.0–15.0)
MCH: 35.8 pg — ABNORMAL HIGH (ref 26.0–34.0)
MCHC: 34.4 g/dL (ref 30.0–36.0)
MCV: 104.3 fL — ABNORMAL HIGH (ref 78.0–100.0)
Platelets: 143 10*3/uL — ABNORMAL LOW (ref 150–400)
RBC: 2.82 MIL/uL — AB (ref 3.87–5.11)
RDW: 15.3 % (ref 11.5–15.5)
WBC: 4.8 10*3/uL (ref 4.0–10.5)

## 2017-04-21 LAB — PATHOLOGIST SMEAR REVIEW

## 2017-04-21 LAB — MAGNESIUM: Magnesium: 1.9 mg/dL (ref 1.7–2.4)

## 2017-04-21 MED ORDER — GABAPENTIN 300 MG PO CAPS
300.0000 mg | ORAL_CAPSULE | Freq: Two times a day (BID) | ORAL | Status: DC
  Administered 2017-04-21: 300 mg via ORAL
  Filled 2017-04-21: qty 1

## 2017-04-21 MED ORDER — FUROSEMIDE 40 MG PO TABS
40.0000 mg | ORAL_TABLET | Freq: Every day | ORAL | Status: DC
  Administered 2017-04-21 – 2017-04-22 (×2): 40 mg via ORAL
  Filled 2017-04-21 (×2): qty 1

## 2017-04-21 MED ORDER — LACTULOSE 10 GM/15ML PO SOLN
30.0000 g | Freq: Once | ORAL | Status: AC
  Administered 2017-04-22: 30 g via ORAL
  Filled 2017-04-21: qty 60

## 2017-04-21 NOTE — Progress Notes (Signed)
[  Late entry]  Evaluated patient due to concern for pulling IV out and refusing replacement, per nursing had been agitated. On exam, patient was lying comfortably in bed eating ice cream. Was alert and oriented without signs of agitation or tremor. Speech was slowed however made sense. Discussed benefits of replacing IV to patient and she was agreeable. Patient did endorse abdominal pain although states it has improved from paracentesis earlier today. Exam revealed tenderness to light palpation diffusely, more so around hernia. Toradol provided for pain. IV Rocephin given, given concern for SBP. Will continue to f/u mental status, appears patient is improving.  Ellwood DenseAlison Rumball, DO PGY-1, [facility] 04/21/2017 12:13 AM

## 2017-04-21 NOTE — Progress Notes (Addendum)
Family Medicine Teaching Service Daily Progress Note Intern Pager: [phone]  Patient name: Linda Vaughn Medical record number: [id number] Date of birth: Apr 20, 1965 Age: 52 y.o. Gender: female  Primary Care Provider: Massie MaroonHollis, Lachina M, FNP Consultants: None Code Status: Full  Pt Overview and Major Events to Date:  4/14 - admitted for abdominal pain, AMS  Assessment and Plan: Linda Vaughn is a 52 y.o. female presenting with 2-3 days of abdominal pain and worsening confusion for the past day. 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  AMS, acute, improving Likely 2/2 hepatic encephalopathy. CIWAs 0 overnight. Received home dose of Lactulose yesterday with 3 recorded BMs. Mental status improved from admission. - Continue home lactulose 30 g BID - daily CBC, BMP  - strict I&Os - vitals per floor routine  Abdominal Pain w/ complex loculated ventral hernia, chronic, improved Received one dose IV Toradol last night. States pain is better after paracentesis yesterday. Upon exam this morning, patient sleeping comfortably. Upon waking and seeing me in the room, began complaining of intense abdominal pain. Abdomen soft with no tenderness appreciated with distraction. Question pain med seeking behavior. With elevated creatinine this morning, hold off on subsequent doses of toradol. Continues to be afebrile without leukocytosis and with stable vitals. Rocephin started last night for empiric SBP coverage. Diagnostic paracentesis performed yesterday with 1.3L output with improvement in pain. Body fluid labs obtained yesterday has not show any growth on gram stain to date. - Srict I&Os - s/p paracentesis - f/u abd fluid culture - K pad for pain - continue Rocephin (4/15 - ) until cultures result.  Anemia and thrombocytopenia, chronic Patient has a history of anemia and thrombocytopenia in the setting of liver failure.  Baseline Hbg between 9-11 recently, remains at baseline. MCV elevated consistent with Macrocytosis in the setting of poor diet with history of alcohol abuse.  - daily CBC - Continue thiamine and folate  Hep C, Chronic Patient was recently treated with ribavirin, and sofosbuvir-velpatasvir in the fall of 2018. Completed treatment. MELD 17 with ascites and hepatosplenomegaly.  - Will continue to monitor  HTN Normotensive overnight. On coreg, spironolactone at home. UDS was positive for cocaine and THC. - Hold coreg given cocaine use - Continue spironolactone 100 mg daily  Depression Previously on Cymbalta, discontinued at last hospitalization. No other daily controller medication. - Will continue to monitor when patient is more stable  Polysubstance Use CIWAs 0 overnight. Endorses daily alcohol use. Denies any other substance use, however UDS on admission positive for THC and cocaine. Patient also reports tobacco abuse and states she smokes two packs a day. On further review of her record appears patient has cut down significantly on her smoking.  - CIWA - nicotine patch - Mag level - folate, thiamine supplementation  FEN/GI: Heart Healthy Prophylaxis: SCDs  Disposition: pending paracentesis culture results  Subjective:  Patient endorsing abdominal pain although sleeping comfortably initially on exam. Requesting IV pain meds. Per nurse, refused ibuprofen.  Objective: Temp:  [97.4 F (36.3 C)-98.3 F (36.8 C)] 97.4 F (36.3 C) (04/16 0435) Pulse Rate:  [69-76] 70 (04/16 0435) Resp:  [16-18] 18 (04/16 0435) BP: (98-118)/(61-72) 105/71 (04/16 0435) SpO2:  [97 %-100 %] 99 % (04/16 0435) Weight:  [195 lb 5.2 oz (88.6 kg)] 195 lb 5.2 oz (88.6 kg) (04/16 0500) Physical Exam: General: sleeping in bed, awakens easily  Cardiovascular: RRR, no mrg Respiratory: CTAB, normal WOB on RA Abdomen:  soft, nondistended, tender to palpation around hernia, however is distractible.  +BS Extremities: warm and well perfused, no LE edema.  Laboratory: Recent Labs  Lab 04/19/17 1630 04/20/17 0402 04/21/17 0452  WBC 5.7 6.0 4.8  HGB 13.1 10.4* 10.1*  HCT 37.1 30.3* 29.4*  PLT 170 154 143*   Recent Labs  Lab 04/19/17 1630 04/20/17 0402 04/21/17 0452  NA 137 136 136  K 4.4 3.3* 4.0  CL 107 107 105  CO2 22 21* 25  BUN 13 11 14   CREATININE 0.84 0.75 1.07*  CALCIUM 9.2 8.3* 8.2*  PROT 9.4* 7.6  --   BILITOT 3.1* 2.4*  --   ALKPHOS 130* 97  --   ALT 25 20  --   AST 53* 41  --   GLUCOSE 101* 167* 80    Imaging/Diagnostic Tests: Ir Paracentesis  Result Date: 04/20/2017 INDICATION: Cirrhosis, hepatitis-C, ascites. Request for diagnostic and therapeutic paracentesis. EXAM: ULTRASOUND GUIDED PARACENTESIS MEDICATIONS: 2% Lidocaine = 10 mL COMPLICATIONS: None immediate. PROCEDURE: Informed written consent was obtained from the patient after a discussion of the risks, benefits and alternatives to treatment. A timeout was performed prior to the initiation of the procedure. Initial ultrasound scanning demonstrates a moderate amount of ascites within the right lower abdominal quadrant. The right lower abdomen was prepped and draped in the usual sterile fashion. 1% lidocaine with epinephrine was used for local anesthesia. Following this, a 19 gauge, 7-cm, Yueh catheter was introduced. An ultrasound image was saved for documentation purposes. The paracentesis was performed. The catheter was removed and a dressing was applied. The patient tolerated the procedure well without immediate post procedural complication. FINDINGS: A total of approximately 1.3 liters of clear yellow fluid was removed. Samples were sent to the laboratory as requested by the clinical team. IMPRESSION: Successful ultrasound-guided paracentesis yielding 1.3 liters of peritoneal fluid. Read by: Corrin Parker, PA-C Electronically Signed   By: Simonne Come M.D.   On: 04/20/2017 11:31   Ellwood Dense,  DO 04/21/2017, 9:30 AM PGY-1, [facility] FPTS Intern pager: [phone], text pages welcome

## 2017-04-21 NOTE — Progress Notes (Signed)
K Pad arrived and applied. RN reviewed usage with patient.   Sim BoastHavy, RN

## 2017-04-21 NOTE — Progress Notes (Signed)
Patient  c/o severe pain to  Mid abdomen at the hernia site  MD notified no medication given per electronic orders to applyy  K Pad this done  with no effect and pt crying moaning MD called x 2 . New orders given and administrated. Will continue to monitor.

## 2017-04-22 DIAGNOSIS — B182 Chronic viral hepatitis C: Secondary | ICD-10-CM

## 2017-04-22 DIAGNOSIS — K439 Ventral hernia without obstruction or gangrene: Secondary | ICD-10-CM

## 2017-04-22 DIAGNOSIS — F1010 Alcohol abuse, uncomplicated: Secondary | ICD-10-CM

## 2017-04-22 LAB — CBC
HCT: 32.5 % — ABNORMAL LOW (ref 36.0–46.0)
Hemoglobin: 11.2 g/dL — ABNORMAL LOW (ref 12.0–15.0)
MCH: 35.7 pg — AB (ref 26.0–34.0)
MCHC: 34.5 g/dL (ref 30.0–36.0)
MCV: 103.5 fL — ABNORMAL HIGH (ref 78.0–100.0)
PLATELETS: 163 10*3/uL (ref 150–400)
RBC: 3.14 MIL/uL — ABNORMAL LOW (ref 3.87–5.11)
RDW: 15.1 % (ref 11.5–15.5)
WBC: 4.8 10*3/uL (ref 4.0–10.5)

## 2017-04-22 LAB — BASIC METABOLIC PANEL
Anion gap: 9 (ref 5–15)
BUN: 13 mg/dL (ref 6–20)
CO2: 21 mmol/L — ABNORMAL LOW (ref 22–32)
CREATININE: 0.95 mg/dL (ref 0.44–1.00)
Calcium: 8.3 mg/dL — ABNORMAL LOW (ref 8.9–10.3)
Chloride: 100 mmol/L — ABNORMAL LOW (ref 101–111)
GFR calc Af Amer: >60 mL/min (ref >60)
Glucose, Bld: 142 mg/dL — ABNORMAL HIGH (ref 65–99)
Potassium: 4.1 mmol/L (ref 3.5–5.1)
SODIUM: 130 mmol/L — AB (ref 135–145)

## 2017-04-22 MED ORDER — LACTULOSE 10 GM/15ML PO SOLN: 30.0000 g | Freq: Two times a day (BID) | ORAL | 1 refills | Status: DC

## 2017-04-22 MED ORDER — FUROSEMIDE 40 MG PO TABS: 40.0000 mg | ORAL_TABLET | Freq: Every day | ORAL | 0 refills | Status: DC

## 2017-04-22 MED ORDER — GABAPENTIN 100 MG PO CAPS: 100.0000 mg | ORAL_CAPSULE | Freq: Three times a day (TID) | ORAL | Status: DC | PRN

## 2017-04-22 MED ORDER — SPIRONOLACTONE 100 MG PO TABS: 100.0000 mg | ORAL_TABLET | Freq: Every day | ORAL | 0 refills | Status: DC

## 2017-04-22 NOTE — Progress Notes (Signed)
Family Medicine Teaching Service Daily Progress Note Intern Pager: [phone]  Patient name: Linda Vaughn Medical record number: [id number] Date of birth: Jan 03, 1966 Age: 52 y.o. Gender: female  Primary Care Provider: [name], FNP Consultants: None Code Status: Full  Pt Overview and Major Events to Date:  4/14 - admitted for abdominal pain, AMS 4/15 - paracentesis, output ~1.3L  Assessment and Plan: Linda Vaughn is a 52 y.o. female presenting with 2-3 days of abdominal pain and worsening confusion for the past day. 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  AMS, acute, resolved Likely 2/2 hepatic encephalopathy. No CIWAs documented overnight but mentating appropriately this morning. Continues to receive home dose of Lactulose with 2 recorded BMs yesterday.  - Continue home lactulose 30 g BID - daily CBC, BMP  - strict I&Os - vitals per floor routine  Abdominal Pain w/ complex loculated ventral hernia, chronic, improved Received dose of gabapentin last night after not much relief from k pad. Rates pain 2/10 this morning without much tenderness on palpation. Paracentesis culture NGx1d, no organisms seen on gram stain. Continues to be afebrile with stable vitals and no leukocytosis. Creatinine improved with discontinuation of toradol. - Srict I&Os - s/p paracentesis - f/u abd fluid culture - K pad for pain - continue Rocephin (4/15 - ) until final cultures result.  Anemia and thrombocytopenia, chronic Patient has a history of anemia and thrombocytopenia in the setting of liver failure. Baseline Hbg between 9-11 recently, remains at baseline. MCV elevated consistent with Macrocytosis in the setting of poor diet with history of alcohol abuse.  - daily CBC - Continue thiamine and folate  Hep C, Chronic  Cirrhosis Patient was recently treated with ribavirin, and sofosbuvir-velpatasvir in the  fall of 2018. Completed treatment. MELD 17 with ascites and hepatosplenomegaly. S/p paracentesis - Will continue to monitor  HTN Normotensive overnight. On coreg, spironolactone at home. UDS was positive for cocaine and THC. Added Lasix 40mg  yesterday. - Hold coreg given cocaine use - Continue spironolactone 100 mg daily  Depression Previously on Cymbalta, discontinued at last hospitalization. No other daily controller medication. - Will continue to monitor when patient is more stable  Polysubstance Use No CIWAs documented overnight. Endorses daily alcohol use. Denies any other substance use, however UDS on admission positive for THC and cocaine. Patient also reports tobacco abuse and states she smokes two packs a day. On further review of her record appears patient has cut down significantly on her smoking.  - CIWA - nicotine patch - folate, thiamine supplementation  FEN/GI: Heart Healthy Prophylaxis: SCDs  Disposition: pending final paracentesis culture results, can likely d/c today  Subjective:  Patient feeling much better today, rates pain 2/10. Agreeable to d/c today, asking for pain meds to go home with.  Objective: Temp:  [97.7 F (36.5 C)-98.5 F (36.9 C)] 97.7 F (36.5 C) (04/17 0800) Pulse Rate:  [67-109] 109 (04/17 0800) Resp:  [16-18] 18 (04/17 0800) BP: (79-124)/(59-87) 79/66 (04/17 0800) SpO2:  [96 %-100 %] 98 % (04/17 0800) Weight:  [190 lb 14.7 oz (86.6 kg)] 190 lb 14.7 oz (86.6 kg) (04/17 6962) Physical Exam: General: sitting in bed, A&O, in NAD  Cardiovascular: RRR, no mrg Respiratory: CTAB, normal WOB on RA Abdomen: soft, nondistended, ventral herna decreased in size from previous. Slightly tender to palpation around hernia base, otherwise nontender. +BS Extremities: warm and well perfused, no LE edema.  Laboratory: Recent Labs  Lab  04/20/17 0402 04/21/17 0452 04/22/17 0523  WBC 6.0 4.8 4.8  HGB 10.4* 10.1* 11.2*  HCT 30.3* 29.4* 32.5*  PLT  154 143* 163   Recent Labs  Lab 04/19/17 1630 04/20/17 0402 04/21/17 0452 04/22/17 0523  NA 137 136 136 130*  K 4.4 3.3* 4.0 4.1  CL 107 107 105 100*  CO2 22 21* 25 21*  BUN 13 11 14 13   CREATININE 0.84 0.75 1.07* 0.95  CALCIUM 9.2 8.3* 8.2* 8.3*  PROT 9.4* 7.6  --   --   BILITOT 3.1* 2.4*  --   --   ALKPHOS 130* 97  --   --   ALT 25 20  --   --   AST 53* 41  --   --   GLUCOSE 101* 167* 80 142*    Imaging/Diagnostic Tests: No results found. Ellwood DenseRumball, Kishawn Pickar, DO 04/22/2017, 9:29 AM PGY-1, [facility] FPTS Intern pager: [phone], text pages welcome

## 2017-04-22 NOTE — Discharge Instructions (Signed)
You were admitted for altered mental status in the setting of not taking your home lactulose. It is important to take every day to prevent confusion and coma. You were managed on your home lactulose and improved. You are being discharged with refills of your home medications. Follow up in one week with your primary doctor.

## 2017-04-22 NOTE — Care Management Note (Signed)
Case Management Note  Patient Details  Name: Linda Vaughn MRN: [id number] Date of Birth: October 25, 1965  Subjective/Objective:                    Action/Plan: CM consulted for medication assistance. Pt has Medicaid and with most medications should only have $1-3 copays. CM met with her and she states she has no issues currently affording her meds. She was on a medication in the past she was having trouble affording but she no longer takes this medication. Pt feels she will be able to afford meds co pays as long as they are on the preferred Medicaid list. CM following.   Expected Discharge Date:                  Expected Discharge Plan:  Home/Self Care  In-House Referral:     Discharge planning Services     Post Acute Care Choice:    Choice offered to:     DME Arranged:    DME Agency:     HH Arranged:    HH Agency:     Status of Service:  In process, will continue to follow  If discussed at Long Length of Stay Meetings, dates discussed:    Additional Comments:  Pollie Friar, RN 04/22/2017, 10:42 AM

## 2017-04-22 NOTE — Progress Notes (Signed)
Discharge instructions reviewed with patient. All questions answered at this time. Abdominal binder given and instructed of usage. Transport home by family.   Sim BoastHavy, RN

## 2017-04-25 LAB — CULTURE, BODY FLUID W GRAM STAIN -BOTTLE: Culture: NO GROWTH

## 2017-04-25 LAB — CULTURE, BODY FLUID-BOTTLE

## 2017-05-11 ENCOUNTER — Ambulatory Visit: Payer: Medicaid Other | Admitting: Family Medicine

## 2017-05-23 ENCOUNTER — Encounter (HOSPITAL_COMMUNITY): Payer: Self-pay | Admitting: Radiology

## 2017-05-23 ENCOUNTER — Emergency Department (HOSPITAL_COMMUNITY): Payer: Medicaid Other

## 2017-05-23 ENCOUNTER — Inpatient Hospital Stay (HOSPITAL_COMMUNITY)
Admission: EM | Admit: 2017-05-23 | Discharge: 2017-05-28 | DRG: 432 | Disposition: A | Discharge status: Home / Self Care | Payer: Medicaid Other | Attending: Family Medicine | Admitting: Family Medicine

## 2017-05-23 DIAGNOSIS — K7031 Alcoholic cirrhosis of liver with ascites: Secondary | ICD-10-CM | POA: Diagnosis present

## 2017-05-23 DIAGNOSIS — F1410 Cocaine abuse, uncomplicated: Secondary | ICD-10-CM | POA: Diagnosis present

## 2017-05-23 DIAGNOSIS — K439 Ventral hernia without obstruction or gangrene: Secondary | ICD-10-CM | POA: Diagnosis present

## 2017-05-23 DIAGNOSIS — F17210 Nicotine dependence, cigarettes, uncomplicated: Secondary | ICD-10-CM | POA: Diagnosis present

## 2017-05-23 DIAGNOSIS — Z886 Allergy status to analgesic agent status: Secondary | ICD-10-CM

## 2017-05-23 DIAGNOSIS — K7682 Hepatic encephalopathy: Secondary | ICD-10-CM | POA: Diagnosis present

## 2017-05-23 DIAGNOSIS — B182 Chronic viral hepatitis C: Secondary | ICD-10-CM | POA: Diagnosis present

## 2017-05-23 DIAGNOSIS — I10 Essential (primary) hypertension: Secondary | ICD-10-CM | POA: Diagnosis present

## 2017-05-23 DIAGNOSIS — Z79899 Other long term (current) drug therapy: Secondary | ICD-10-CM

## 2017-05-23 DIAGNOSIS — D638 Anemia in other chronic diseases classified elsewhere: Secondary | ICD-10-CM | POA: Diagnosis present

## 2017-05-23 DIAGNOSIS — D539 Nutritional anemia, unspecified: Secondary | ICD-10-CM | POA: Diagnosis present

## 2017-05-23 DIAGNOSIS — R188 Other ascites: Secondary | ICD-10-CM | POA: Diagnosis present

## 2017-05-23 DIAGNOSIS — G9341 Metabolic encephalopathy: Secondary | ICD-10-CM | POA: Diagnosis present

## 2017-05-23 DIAGNOSIS — Z79891 Long term (current) use of opiate analgesic: Secondary | ICD-10-CM

## 2017-05-23 DIAGNOSIS — F329 Major depressive disorder, single episode, unspecified: Secondary | ICD-10-CM | POA: Diagnosis present

## 2017-05-23 DIAGNOSIS — F1010 Alcohol abuse, uncomplicated: Secondary | ICD-10-CM | POA: Diagnosis present

## 2017-05-23 DIAGNOSIS — W19XXXA Unspecified fall, initial encounter: Secondary | ICD-10-CM | POA: Diagnosis present

## 2017-05-23 DIAGNOSIS — K7200 Acute and subacute hepatic failure without coma: Secondary | ICD-10-CM | POA: Diagnosis present

## 2017-05-23 DIAGNOSIS — Z9119 Patient's noncompliance with other medical treatment and regimen: Secondary | ICD-10-CM

## 2017-05-23 DIAGNOSIS — F1910 Other psychoactive substance abuse, uncomplicated: Secondary | ICD-10-CM | POA: Diagnosis present

## 2017-05-23 DIAGNOSIS — K429 Umbilical hernia without obstruction or gangrene: Secondary | ICD-10-CM | POA: Diagnosis present

## 2017-05-23 DIAGNOSIS — K7290 Hepatic failure, unspecified without coma: Secondary | ICD-10-CM

## 2017-05-23 DIAGNOSIS — K7460 Unspecified cirrhosis of liver: Secondary | ICD-10-CM | POA: Diagnosis present

## 2017-05-23 DIAGNOSIS — F17200 Nicotine dependence, unspecified, uncomplicated: Secondary | ICD-10-CM | POA: Diagnosis present

## 2017-05-23 DIAGNOSIS — K7040 Alcoholic hepatic failure without coma: Principal | ICD-10-CM | POA: Diagnosis present

## 2017-05-23 DIAGNOSIS — F32A Depression, unspecified: Secondary | ICD-10-CM | POA: Diagnosis present

## 2017-05-23 DIAGNOSIS — D6959 Other secondary thrombocytopenia: Secondary | ICD-10-CM | POA: Diagnosis present

## 2017-05-23 DIAGNOSIS — Z716 Tobacco abuse counseling: Secondary | ICD-10-CM

## 2017-05-23 DIAGNOSIS — Z9049 Acquired absence of other specified parts of digestive tract: Secondary | ICD-10-CM

## 2017-05-23 LAB — URINALYSIS, ROUTINE W REFLEX MICROSCOPIC
BILIRUBIN URINE: NEGATIVE
Glucose, UA: NEGATIVE mg/dL
HGB URINE DIPSTICK: NEGATIVE
Ketones, ur: NEGATIVE mg/dL
Leukocytes, UA: NEGATIVE
NITRITE: NEGATIVE
PROTEIN: NEGATIVE mg/dL
SPECIFIC GRAVITY, URINE: 1.011 (ref 1.005–1.030)
pH: 8 (ref 5.0–8.0)

## 2017-05-23 LAB — COMPREHENSIVE METABOLIC PANEL
ALBUMIN: 2 g/dL — AB (ref 3.5–5.0)
ALT: 24 U/L (ref 14–54)
AST: 47 U/L — AB (ref 15–41)
Alkaline Phosphatase: 128 U/L — ABNORMAL HIGH (ref 38–126)
Anion gap: 8 (ref 5–15)
BUN: 10 mg/dL (ref 6–20)
CO2: 23 mmol/L (ref 22–32)
CREATININE: 0.8 mg/dL (ref 0.44–1.00)
Calcium: 8.7 mg/dL — ABNORMAL LOW (ref 8.9–10.3)
Chloride: 107 mmol/L (ref 101–111)
GFR calc non Af Amer: >60 mL/min (ref >60)
GLUCOSE: 93 mg/dL (ref 65–99)
Potassium: 3.9 mmol/L (ref 3.5–5.1)
SODIUM: 138 mmol/L (ref 135–145)
Total Bilirubin: 2.3 mg/dL — ABNORMAL HIGH (ref 0.3–1.2)
Total Protein: 7.3 g/dL (ref 6.5–8.1)

## 2017-05-23 LAB — LIPASE, BLOOD: Lipase: 31 U/L (ref 11–51)

## 2017-05-23 LAB — CBC
HEMATOCRIT: 33.2 % — AB (ref 36.0–46.0)
HEMOGLOBIN: 11.6 g/dL — AB (ref 12.0–15.0)
MCH: 36.8 pg — ABNORMAL HIGH (ref 26.0–34.0)
MCHC: 34.9 g/dL (ref 30.0–36.0)
MCV: 105.4 fL — AB (ref 78.0–100.0)
Platelets: 167 10*3/uL (ref 150–400)
RBC: 3.15 MIL/uL — ABNORMAL LOW (ref 3.87–5.11)
RDW: 14.8 % (ref 11.5–15.5)
WBC: 6.3 10*3/uL (ref 4.0–10.5)

## 2017-05-23 LAB — CBG MONITORING, ED: Glucose-Capillary: 89 mg/dL (ref 65–99)

## 2017-05-23 LAB — RAPID URINE DRUG SCREEN, HOSP PERFORMED
Amphetamines: NOT DETECTED
Barbiturates: NOT DETECTED
Benzodiazepines: NOT DETECTED
COCAINE: POSITIVE — AB
OPIATES: NOT DETECTED
TETRAHYDROCANNABINOL: POSITIVE — AB

## 2017-05-23 LAB — I-STAT TROPONIN, ED: Troponin i, poc: 0.01 ng/mL (ref 0.00–0.08)

## 2017-05-23 LAB — AMMONIA: AMMONIA: 204 umol/L — AB (ref 9–35)

## 2017-05-23 LAB — I-STAT BETA HCG BLOOD, ED (MC, WL, AP ONLY)

## 2017-05-23 MED ORDER — LACTULOSE ENEMA
300.0000 mL | Freq: Once | ORAL | Status: AC
  Administered 2017-05-24: 300 mL via RECTAL
  Filled 2017-05-23: qty 300

## 2017-05-23 MED ORDER — IOPAMIDOL (ISOVUE-300) INJECTION 61%
INTRAVENOUS | Status: AC
  Filled 2017-05-23: qty 100

## 2017-05-23 MED ORDER — IOPAMIDOL (ISOVUE-300) INJECTION 61%
100.0000 mL | Freq: Once | INTRAVENOUS | Status: AC | PRN
  Administered 2017-05-23: 100 mL via INTRAVENOUS

## 2017-05-23 MED ORDER — LORAZEPAM 2 MG/ML IJ SOLN
0.5000 mg | Freq: Once | INTRAMUSCULAR | Status: AC
Start: 2017-05-23 — End: 2017-05-23
  Administered 2017-05-23: 0.5 mg via INTRAMUSCULAR
  Filled 2017-05-23: qty 1
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
## 2017-05-23 NOTE — ED Notes (Signed)
Pt. CBG 89, RN,Stepahnie made aware.

## 2017-05-23 NOTE — ED Notes (Signed)
Bed: WA21 Expected date:  Expected time:  Means of arrival:  Comments: 52 yo F/ AMS

## 2017-05-23 NOTE — ED Notes (Signed)
EKG given to EDP,[place],.MD., for review. 

## 2017-05-23 NOTE — ED Triage Notes (Signed)
Pt BIB [facility]. 1500 son stated that when he returned home the pt was not responding like normal. The pt will talk to someone who appears to not be there or is not making sense to address EMS. No deficits related to a stroke. Hx hep C, cirrhosis.  No signs of trauma or overdose. Pt normally CAOx4 currently she is CAOx1. Pt seems agitated and keeps fidgeting.

## 2017-05-23 NOTE — ED Notes (Signed)
Pt was able to ambulate without assistance she got up from bed and had the monitor cords dragging after her. She was topless at that time. We were able to get her back into bed. Pt will answer some questions appropriately and some she says inappropriate words. Pt keeps trying to get up from bed. Is at risk of injuring herself at this time.

## 2017-05-24 DIAGNOSIS — K7040 Alcoholic hepatic failure without coma: Secondary | ICD-10-CM | POA: Diagnosis present

## 2017-05-24 DIAGNOSIS — Z9119 Patient's noncompliance with other medical treatment and regimen: Secondary | ICD-10-CM | POA: Diagnosis not present

## 2017-05-24 DIAGNOSIS — K7031 Alcoholic cirrhosis of liver with ascites: Secondary | ICD-10-CM | POA: Diagnosis present

## 2017-05-24 DIAGNOSIS — G9341 Metabolic encephalopathy: Secondary | ICD-10-CM | POA: Diagnosis present

## 2017-05-24 DIAGNOSIS — I10 Essential (primary) hypertension: Secondary | ICD-10-CM

## 2017-05-24 DIAGNOSIS — D6959 Other secondary thrombocytopenia: Secondary | ICD-10-CM | POA: Diagnosis present

## 2017-05-24 DIAGNOSIS — D539 Nutritional anemia, unspecified: Secondary | ICD-10-CM

## 2017-05-24 DIAGNOSIS — K429 Umbilical hernia without obstruction or gangrene: Secondary | ICD-10-CM | POA: Diagnosis present

## 2017-05-24 DIAGNOSIS — Z716 Tobacco abuse counseling: Secondary | ICD-10-CM | POA: Diagnosis not present

## 2017-05-24 DIAGNOSIS — F17210 Nicotine dependence, cigarettes, uncomplicated: Secondary | ICD-10-CM | POA: Diagnosis present

## 2017-05-24 DIAGNOSIS — F329 Major depressive disorder, single episode, unspecified: Secondary | ICD-10-CM | POA: Diagnosis present

## 2017-05-24 DIAGNOSIS — K7290 Hepatic failure, unspecified without coma: Secondary | ICD-10-CM

## 2017-05-24 DIAGNOSIS — K7200 Acute and subacute hepatic failure without coma: Secondary | ICD-10-CM

## 2017-05-24 DIAGNOSIS — F1410 Cocaine abuse, uncomplicated: Secondary | ICD-10-CM | POA: Diagnosis present

## 2017-05-24 DIAGNOSIS — F1010 Alcohol abuse, uncomplicated: Secondary | ICD-10-CM | POA: Diagnosis present

## 2017-05-24 DIAGNOSIS — B182 Chronic viral hepatitis C: Secondary | ICD-10-CM

## 2017-05-24 DIAGNOSIS — Z9049 Acquired absence of other specified parts of digestive tract: Secondary | ICD-10-CM | POA: Diagnosis not present

## 2017-05-24 DIAGNOSIS — F1910 Other psychoactive substance abuse, uncomplicated: Secondary | ICD-10-CM | POA: Diagnosis present

## 2017-05-24 DIAGNOSIS — F17200 Nicotine dependence, unspecified, uncomplicated: Secondary | ICD-10-CM

## 2017-05-24 DIAGNOSIS — Z79891 Long term (current) use of opiate analgesic: Secondary | ICD-10-CM | POA: Diagnosis not present

## 2017-05-24 DIAGNOSIS — Z79899 Other long term (current) drug therapy: Secondary | ICD-10-CM | POA: Diagnosis not present

## 2017-05-24 DIAGNOSIS — W19XXXA Unspecified fall, initial encounter: Secondary | ICD-10-CM | POA: Diagnosis present

## 2017-05-24 DIAGNOSIS — D638 Anemia in other chronic diseases classified elsewhere: Secondary | ICD-10-CM | POA: Diagnosis present

## 2017-05-24 DIAGNOSIS — K439 Ventral hernia without obstruction or gangrene: Secondary | ICD-10-CM | POA: Diagnosis present

## 2017-05-24 DIAGNOSIS — Z886 Allergy status to analgesic agent status: Secondary | ICD-10-CM | POA: Diagnosis not present

## 2017-05-24 LAB — COMPREHENSIVE METABOLIC PANEL
ALT: 24 U/L (ref 14–54)
ANION GAP: 8 (ref 5–15)
AST: 45 U/L — AB (ref 15–41)
Albumin: 1.8 g/dL — ABNORMAL LOW (ref 3.5–5.0)
Alkaline Phosphatase: 113 U/L (ref 38–126)
BUN: 9 mg/dL (ref 6–20)
CALCIUM: 8.5 mg/dL — AB (ref 8.9–10.3)
CO2: 22 mmol/L (ref 22–32)
CREATININE: 0.69 mg/dL (ref 0.44–1.00)
Chloride: 107 mmol/L (ref 101–111)
GFR calc Af Amer: >60 mL/min (ref >60)
GFR calc non Af Amer: >60 mL/min (ref >60)
Glucose, Bld: 81 mg/dL (ref 65–99)
Potassium: 3.6 mmol/L (ref 3.5–5.1)
Sodium: 137 mmol/L (ref 135–145)
TOTAL PROTEIN: 6.9 g/dL (ref 6.5–8.1)
Total Bilirubin: 2.1 mg/dL — ABNORMAL HIGH (ref 0.3–1.2)

## 2017-05-24 LAB — CBC
HCT: 34.5 % — ABNORMAL LOW (ref 36.0–46.0)
HEMOGLOBIN: 11.8 g/dL — AB (ref 12.0–15.0)
MCH: 36.2 pg — ABNORMAL HIGH (ref 26.0–34.0)
MCHC: 34.2 g/dL (ref 30.0–36.0)
MCV: 105.8 fL — ABNORMAL HIGH (ref 78.0–100.0)
PLATELETS: 130 10*3/uL — AB (ref 150–400)
RBC: 3.26 MIL/uL — AB (ref 3.87–5.11)
RDW: 14.4 % (ref 11.5–15.5)
WBC: 5.4 10*3/uL (ref 4.0–10.5)

## 2017-05-24 LAB — PROTIME-INR
INR: 1.78
Prothrombin Time: 20.6 seconds — ABNORMAL HIGH (ref 11.4–15.2)

## 2017-05-24 LAB — MRSA PCR SCREENING: MRSA by PCR: NEGATIVE

## 2017-05-24 LAB — PHOSPHORUS: PHOSPHORUS: 3.9 mg/dL (ref 2.5–4.6)

## 2017-05-24 LAB — MAGNESIUM: Magnesium: 1.6 mg/dL — ABNORMAL LOW (ref 1.7–2.4)

## 2017-05-24 LAB — TSH: TSH: 4.448 u[IU]/mL (ref 0.350–4.500)

## 2017-05-24 LAB — ETHANOL: Alcohol, Ethyl (B): <10 mg/dL (ref <10)

## 2017-05-24 LAB — AMMONIA: AMMONIA: 69 umol/L — AB (ref 9–35)

## 2017-05-24 MED ORDER — SODIUM CHLORIDE 0.9 % IV SOLN: 250.0000 mL | INTRAVENOUS | Status: DC | PRN

## 2017-05-24 MED ORDER — ADULT MULTIVITAMIN W/MINERALS CH
1.0000 | ORAL_TABLET | Freq: Every day | ORAL | Status: DC
  Administered 2017-05-24 – 2017-05-28 (×5): 1 via ORAL
  Filled 2017-05-24 (×5): qty 1

## 2017-05-24 MED ORDER — LORAZEPAM 2 MG/ML IJ SOLN
0.5000 mg | Freq: Four times a day (QID) | INTRAMUSCULAR | Status: AC | PRN
  Administered 2017-05-24 – 2017-05-26 (×4): 0.5 mg via INTRAVENOUS
  Filled 2017-05-24 (×5): qty 1

## 2017-05-24 MED ORDER — LACTULOSE 10 GM/15ML PO SOLN
30.0000 g | Freq: Three times a day (TID) | ORAL | Status: DC
  Administered 2017-05-24 – 2017-05-27 (×11): 30 g via ORAL
  Filled 2017-05-24 (×12): qty 45

## 2017-05-24 MED ORDER — LORAZEPAM 0.5 MG PO TABS
0.5000 mg | ORAL_TABLET | Freq: Four times a day (QID) | ORAL | Status: AC | PRN
  Administered 2017-05-26: 0.5 mg via ORAL
  Filled 2017-05-24: qty 1

## 2017-05-24 MED ORDER — ONDANSETRON HCL 4 MG PO TABS
4.0000 mg | ORAL_TABLET | Freq: Four times a day (QID) | ORAL | Status: DC | PRN
  Administered 2017-05-25 – 2017-05-27 (×2): 4 mg via ORAL
  Filled 2017-05-24 (×2): qty 1

## 2017-05-24 MED ORDER — SODIUM CHLORIDE 0.9% FLUSH: 3.0000 mL | INTRAVENOUS | Status: DC | PRN

## 2017-05-24 MED ORDER — FUROSEMIDE 40 MG PO TABS
40.0000 mg | ORAL_TABLET | Freq: Every day | ORAL | Status: DC
  Administered 2017-05-24 – 2017-05-28 (×5): 40 mg via ORAL
  Filled 2017-05-24 (×5): qty 1

## 2017-05-24 MED ORDER — MAGNESIUM SULFATE 2 GM/50ML IV SOLN
2.0000 g | Freq: Once | INTRAVENOUS | Status: AC
  Administered 2017-05-24: 2 g via INTRAVENOUS
  Filled 2017-05-24: qty 50

## 2017-05-24 MED ORDER — LACTULOSE ENEMA
300.0000 mL | Freq: Once | ORAL | Status: DC
  Filled 2017-05-24: qty 300

## 2017-05-24 MED ORDER — SODIUM CHLORIDE 0.9% FLUSH
3.0000 mL | Freq: Two times a day (BID) | INTRAVENOUS | Status: DC
  Administered 2017-05-24 – 2017-05-28 (×10): 3 mL via INTRAVENOUS

## 2017-05-24 MED ORDER — FOLIC ACID 1 MG PO TABS
1.0000 mg | ORAL_TABLET | Freq: Every day | ORAL | Status: DC
  Administered 2017-05-24 – 2017-05-28 (×5): 1 mg via ORAL
  Filled 2017-05-24 (×5): qty 1

## 2017-05-24 MED ORDER — SPIRONOLACTONE 100 MG PO TABS
100.0000 mg | ORAL_TABLET | Freq: Every day | ORAL | Status: DC
  Administered 2017-05-24 – 2017-05-28 (×5): 100 mg via ORAL
  Filled 2017-05-24: qty 4
  Filled 2017-05-24: qty 1
  Filled 2017-05-24 (×3): qty 4

## 2017-05-24 MED ORDER — VITAMIN B-1 100 MG PO TABS
100.0000 mg | ORAL_TABLET | Freq: Every day | ORAL | Status: DC
  Administered 2017-05-24 – 2017-05-28 (×5): 100 mg via ORAL
  Filled 2017-05-24 (×5): qty 1

## 2017-05-24 MED ORDER — NICOTINE 21 MG/24HR TD PT24
21.0000 mg | MEDICATED_PATCH | Freq: Every day | TRANSDERMAL | Status: DC
  Filled 2017-05-24 (×5): qty 1

## 2017-05-24 MED ORDER — LORAZEPAM 2 MG/ML IJ SOLN: 0.5000 mg | Freq: Once | INTRAMUSCULAR | Status: AC

## 2017-05-24 MED ORDER — ONDANSETRON HCL 4 MG/2ML IJ SOLN
4.0000 mg | Freq: Four times a day (QID) | INTRAMUSCULAR | Status: DC | PRN
  Administered 2017-05-28: 4 mg via INTRAVENOUS
  Filled 2017-05-24: qty 2

## 2017-05-24 NOTE — ED Notes (Signed)
ED TO INPATIENT HANDOFF REPORT  Name/Age/Gender Linda Vaughn 52 y.o. female  Code Status Code Status History    Date Active Date Inactive Code Status Order ID Comments User Context   04/19/2017 2351 04/22/2017 1748 Full Code [id number]  Rory Percy, DO ED   03/25/2017 1055 03/27/2017 1657 Full Code [id number]  Samella Parr, NP ED   01/26/2017 1658 01/31/2017 1551 Full Code [id number]  Roxan Hockey, MD ED   11/24/2016 1035 11/28/2016 1847 Full Code [id number]  Jani Gravel, MD ED   10/08/2015 1012 10/09/2015 1432 Full Code [id number]  Murlean Iba, MD Inpatient   05/24/2015 2102 05/30/2015 1712 Full Code [id number]  Florencia Reasons, MD Inpatient      Home/SNF/Other Home  Chief Complaint altered mental status  Level of Care/Admitting Diagnosis ED Disposition    ED Disposition Condition [place]: [facility] [[id number]]  Level of Care: Stepdown [14]  Admit to SDU based on following criteria: Severe physiological/psychological symptoms:  Any diagnosis requiring assessment & intervention at least every 4 hours on an ongoing basis to obtain desired patient outcomes including stability and rehabilitation  Diagnosis: Hepatic encephalopathy ([place]) [572.2.ICD-9-CM]  Admitting Physician: Toy Baker [[id number]]  Attending Physician: Toy Baker [[id number]]  Estimated length of stay: 3 - 4 days  Certification:: I certify this patient will need inpatient services for at least 2 midnights  PT Class (Do Not Modify): Inpatient [101]  PT Acc Code (Do Not Modify): Private [1]       Medical History Past Medical History:  Diagnosis Date  . Arthritis   . Ascites   . Cirrhosis of liver ([place])   . Gallstones   . Hepatitis C   . Hypertension   . Macrocytic anemia   . Recurrent right pleural effusion   . SBP (spontaneous bacterial peritonitis) ([place]) 11/23/2016  . Thrombocytopenia ([place])   . Umbilical hernia     Allergies Allergies  Allergen  Reactions  . Aspirin Other (See Comments)    Liver damage   . Ibuprofen Other (See Comments)    Liver damage  . Tylenol [Acetaminophen] Other (See Comments)    Liver damage    IV Location/Drains/Wounds Patient Lines/Drains/Airways Status   Active Line/Drains/Airways    Name:   Placement date:   Placement time:   Site:   Days:   Peripheral IV 05/23/17 Left Forearm   05/23/17    -    Forearm   1          Labs/Imaging Results for orders placed or performed during the hospital encounter of 05/23/17 (from the past 48 hour(s))  CBC     Status: Abnormal   Collection Time: 05/23/17  8:40 PM  Result Value Ref Range   WBC 6.3 4.0 - 10.5 K/uL   RBC 3.15 (L) 3.87 - 5.11 MIL/uL   Hemoglobin 11.6 (L) 12.0 - 15.0 g/dL   HCT 33.2 (L) 36.0 - 46.0 %   MCV 105.4 (H) 78.0 - 100.0 fL   MCH 36.8 (H) 26.0 - 34.0 pg   MCHC 34.9 30.0 - 36.0 g/dL   RDW 14.8 11.5 - 15.5 %   Platelets 167 150 - 400 K/uL    Comment: Performed at [facility], [place] [address] [place], [place] [address]  Ammonia     Status: Abnormal   Collection Time: 05/23/17  8:40 PM  Result Value Ref Range   Ammonia 204 (H) 9 - 35 umol/L  Comment: Performed at [facility], [place] [address], [place], [place] [address]  Urinalysis, Routine w reflex microscopic     Status: None   Collection Time: 05/23/17  8:40 PM  Result Value Ref Range   Color, Urine YELLOW YELLOW   APPearance CLEAR CLEAR   Specific Gravity, Urine 1.011 1.005 - 1.030   pH 8.0 5.0 - 8.0   Glucose, UA NEGATIVE NEGATIVE mg/dL   Hgb urine dipstick NEGATIVE NEGATIVE   Bilirubin Urine NEGATIVE NEGATIVE   Ketones, ur NEGATIVE NEGATIVE mg/dL   Protein, ur NEGATIVE NEGATIVE mg/dL   Nitrite NEGATIVE NEGATIVE   Leukocytes, UA NEGATIVE NEGATIVE    Comment: Performed at [place] [address], [place], [place] [address]  Rapid urine drug screen (hospital performed)     Status: Abnormal   Collection Time:  05/23/17  8:40 PM  Result Value Ref Range   Opiates NONE DETECTED NONE DETECTED   Cocaine POSITIVE (A) NONE DETECTED   Benzodiazepines NONE DETECTED NONE DETECTED   Amphetamines NONE DETECTED NONE DETECTED   Tetrahydrocannabinol POSITIVE (A) NONE DETECTED   Barbiturates NONE DETECTED NONE DETECTED    Comment: (NOTE) DRUG SCREEN FOR MEDICAL PURPOSES ONLY.  IF CONFIRMATION IS NEEDED FOR ANY PURPOSE, NOTIFY LAB WITHIN 5 DAYS. LOWEST DETECTABLE LIMITS FOR URINE DRUG SCREEN Drug Class                     Cutoff (ng/mL) Amphetamine and metabolites    1000 Barbiturate and metabolites    200 Benzodiazepine                 696 Tricyclics and metabolites     300 Opiates and metabolites        300 Cocaine and metabolites        300 THC                            50 Performed at [facility], [place] [address] [place], [place] [address]   CBG monitoring, ED     Status: None   Collection Time: 05/23/17  8:44 PM  Result Value Ref Range   Glucose-Capillary 89 65 - 99 mg/dL   Comment 1 Notify RN   I-Stat Troponin, ED (not at [facility])     Status: None   Collection Time: 05/23/17  8:50 PM  Result Value Ref Range   Troponin i, poc 0.01 0.00 - 0.08 ng/mL   Comment 3            Comment: Due to the release kinetics of cTnI, a negative result within the first hours of the onset of symptoms does not rule out myocardial infarction with certainty. If myocardial infarction is still suspected, repeat the test at appropriate intervals.   I-Stat beta hCG blood, ED     Status: None   Collection Time: 05/23/17  8:51 PM  Result Value Ref Range   I-stat hCG, quantitative <5.0 <5 mIU/mL   Comment 3            Comment:   GEST. AGE      CONC.  (mIU/mL)   <=1 WEEK        5 - 50     2 WEEKS       50 - 500     3 WEEKS       100 - 10,000     4 WEEKS  1,000 - 30,000        FEMALE AND NON-PREGNANT FEMALE:     LESS THAN 5 mIU/mL   Comprehensive metabolic panel     Status: Abnormal    Collection Time: 05/23/17 10:30 PM  Result Value Ref Range   Sodium 138 135 - 145 mmol/L   Potassium 3.9 3.5 - 5.1 mmol/L   Chloride 107 101 - 111 mmol/L   CO2 23 22 - 32 mmol/L   Glucose, Bld 93 65 - 99 mg/dL   BUN 10 6 - 20 mg/dL   Creatinine, Ser 0.80 0.44 - 1.00 mg/dL   Calcium 8.7 (L) 8.9 - 10.3 mg/dL   Total Protein 7.3 6.5 - 8.1 g/dL   Albumin 2.0 (L) 3.5 - 5.0 g/dL   AST 47 (H) 15 - 41 U/L   ALT 24 14 - 54 U/L   Alkaline Phosphatase 128 (H) 38 - 126 U/L   Total Bilirubin 2.3 (H) 0.3 - 1.2 mg/dL   GFR calc non Af Amer >60 >60 mL/min   GFR calc Af Amer >60 >60 mL/min    Comment: (NOTE) The eGFR has been calculated using the CKD EPI equation. This calculation has not been validated in all clinical situations. eGFR's persistently <60 mL/min signify possible Chronic Kidney Disease.    Anion gap 8 5 - 15    Comment: Performed at [facility], [place] [address] [place], [place] [address]  Lipase, blood     Status: None   Collection Time: 05/23/17 10:30 PM  Result Value Ref Range   Lipase 31 11 - 51 U/L    Comment: Performed at [facility], [place] [address] [place], [place] [address]   Ct Head Wo Contrast  Result Date: 05/24/2017 CLINICAL DATA:  Encephalopathy.  Altered mental status. EXAM: CT HEAD WITHOUT CONTRAST TECHNIQUE: Contiguous axial images were obtained from the base of the skull through the vertex without intravenous contrast. COMPARISON:  04/19/2017 FINDINGS: Limited by motion artifacts. Brain: No large vascular territory infarct, hemorrhage, midline shift or edema. No hydrocephalus. Midline fourth ventricle and basal cisterns without effacement. Vascular: No hyperdense vessel sign. Tiny foci of intravascular air likely iatrogenic from IV catheterization. Skull: Intact Sinuses/Orbits: Nonacute Other: None IMPRESSION: Chronic stable appearance of the brain. No acute intracranial abnormality. Electronically Signed   By: Ashley Royalty M.D.    On: 05/24/2017 00:13   Ct Abdomen Pelvis W Contrast  Result Date: 05/24/2017 CLINICAL DATA:  Altered mental status. Complicated hernia. History of hepatitis-C and cirrhosis. EXAM: CT ABDOMEN AND PELVIS WITH CONTRAST TECHNIQUE: Multidetector CT imaging of the abdomen and pelvis was performed using the standard protocol following bolus administration of intravenous contrast. CONTRAST:  183m ISOVUE-300 IOPAMIDOL (ISOVUE-300) INJECTION 61% COMPARISON:  04/19/2017 FINDINGS: Lower chest: Chronic stable small right effusion with atelectasis. Included heart is top normal. No pericardial effusion. Hepatobiliary: Morphologic changes of hepatic cirrhosis with surface nodularity and small shrunken appearance of the liver. No space-occupying mass. Cholecystectomy clips are present in the gallbladder fossa. Recannulized umbilical vein. Pancreas: Normal without ductal dilatation or mass. Spleen: No splenomegaly. Adrenals/Urinary Tract: Unremarkable adrenal glands, kidneys and bladder. No nephrolithiasis, enhancing renal mass nor obstructive uropathy. Stomach/Bowel: Decompressed stomach. Nonobstructed, nondistended small bowel. Scattered colonic diverticulosis without acute diverticulitis. Vascular/Lymphatic: Aortoiliac and branch vessel atherosclerosis without aneurysm. No lymphadenopathy. Reproductive: Uterus and adnexa are normal. Other: Moderate to large periumbilical hernia containing septated loculated fluid, currently measuring 12.3 cm transverse by 6.2 cm AP versus 11.7 cm x 6.2  cm previously. No definite evidence of bowel herniation or incarceration. Moderate ascites. Musculoskeletal: No acute or significant osseous findings. IMPRESSION: 1. Unchanged small right pleural effusion with atelectasis. 2. Redemonstration of hepatic cirrhosis and moderate volume of ascites. 3. Slightly larger periumbilical fluid containing hernia, currently estimated at 12.3 x 6.2 cm versus 11.7 x 6.2 cm previously. Electronically Signed    By: Ashley Royalty M.D.   On: 05/24/2017 00:11   Dg Chest Portable 1 View  Result Date: 05/23/2017 CLINICAL DATA:  Altered mental status EXAM: PORTABLE CHEST 1 VIEW COMPARISON:  03/25/2017 FINDINGS: Heart is borderline in size. Mild vascular congestion and bibasilar atelectasis. No effusions or acute bony abnormality. IMPRESSION: Vascular congestion, bibasilar atelectasis. Electronically Signed   By: Rolm Baptise M.D.   On: 05/23/2017 21:23    Pending Labs Unresulted Labs (From admission, onward)   Start     Ordered   05/24/17 0122  Protime-INR  Once,   R     05/24/17 0121   05/24/17 0114  Ethanol  Add-on,   R     05/24/17 0113   Signed and Held  Ammonia  Tomorrow morning,   STAT     Signed and Held   Signed and Held  Magnesium  Tomorrow morning,   R    Comments:  Call MD if <1.5    Signed and Held   Signed and Held  Phosphorus  Tomorrow morning,   R     Signed and Held   Signed and Held  TSH  Once,   R    Comments:  Cancel if already done within 1 month and notify MD    Signed and Held   Signed and Held  Comprehensive metabolic panel  Once,   R    Comments:  Cal MD for K<3.5 or >5.0    Signed and Held   Signed and Held  CBC  Once,   R    Comments:  Call for hg <8.0    Signed and Held      Vitals/Pain Today's Vitals   05/23/17 2103 05/23/17 2200 05/24/17 0025 05/24/17 0030  BP:  119/66 138/90 115/85  Pulse:  93 88 89  Resp:  20 14 18   Temp: 98.7 F (37.1 C)     TempSrc: Rectal     SpO2:  99% 98% 97%    Isolation Precautions No active isolations  Medications Medications  iopamidol (ISOVUE-300) 61 % injection (has no administration in time range)  lactulose (CHRONULAC) enema 200 gm (has no administration in time range)  LORazepam (ATIVAN) injection 0.5 mg (0.5 mg Intramuscular Given 05/23/17 2035)  lactulose (CHRONULAC) enema 200 gm (300 mLs Rectal Given 05/24/17 0035)  iopamidol (ISOVUE-300) 61 % injection 100 mL (100 mLs Intravenous Contrast Given 05/23/17 2331)     Mobility Walks normally, has steady gait currently however is altered and should not ambulate.

## 2017-05-24 NOTE — ED Notes (Signed)
Per lab states hemolyzed blue top must redraw.

## 2017-05-24 NOTE — Progress Notes (Signed)
PROGRESS NOTE  Linda Vaughn WGN:[id number] DOB: [birth year] DOA: 05/23/2017 PCP: [name], FNP  HPI/Recap of past 24 hours: Linda Vaughn is a 52 y.o. female with medical history significant of cirrhosis secondary to alcohol abuse and hepatitis C, hypertension, depression, polysubstance abuse, Ventral hernia, cocaine abuse  Presented with   altered mental status was found like this by her son today son currently not at bedside no family available.  Patient unable to provide her own history.  1 month ago was admitted by family medicine with worsening confusion secondary to hepatic encephalopathy at that time UDS positive for cocaine and THC. CT of her abdomen was done and was nonacute home lactulose was restarted and patient improved she undergone diagnostic paracentesis showed no evidence of SBP patient has chronic abdominal pain secondary to ventral hernia reportedly undergoing elective evaluation for this is an outpatient during last admission Lasix for was added to help with ascites and her Coreg was stopped secondary to recurrent cocaine abuse.  ED course: Ammonia level about 200.  Given lactulose enema.  Ammonia improved to 69 this morning.  05/24/17: Patient seen and examined at bedside.  She is somnolent however she arouses to sternal rubs.  Unable to provide a history due to somnolence.    Assessment/Plan: Active Problems:   HTN (hypertension)   Depression   Tobacco dependence   Umbilical hernia without obstruction and without gangrene   Ascites   Chronic hepatitis C without hepatic coma (HCC)   Cirrhosis of liver (HCC)   Macrocytic anemia   Acute hepatic encephalopathy  Acute hepatic encephalopathy Appears the patient was noncompliant with her lactulose Ammonia level on admission >200 Given rectal lactulose Ammonia level this morning 69 Continue lactulose 3 times daily Repeat ammonia level in the morning Reorient as needed Fall precautions  Chronic  hepatitis C/alcoholic cirrhosis Continue lactulose Continue Spironolactone Continue Lasix Monitor for ascites  Ascites in the setting of cirrhosis of liver Last paracentesis was on 04/20/2017 with 1.3 L removed.  Hypomagnesemia Magnesium 1.6 Repleted with IV magnesium 2 g once  Chronic anemia/anemia of chronic disease Baseline Hg 11.8 At baseline No sign of overt bleeding Repeat CBC in the morning  History of polysubstance abuse CIWA protocol in place Nicotine patch increased Recent UDS positive for cocaine polysubstance cessation once she is more alert Continue thiamine  Chronic thrombocytoplenia No sign of overt bleeding Platelet 130 INR 1.78 on 05/24/2017 Continue 2 to monitor for any sign of bleeding   Code Status: full  Family Communication: None at bedside  Disposition Plan: Home when clinically stable   Consultants:  none  Procedures:  none  Antimicrobials:  none  DVT prophylaxis: SCDs due to autoanticoagulation and thrombo-cytopenia   Objective: Vitals:   05/24/17 0900 05/24/17 1000 05/24/17 1100 05/24/17 1200  BP:      Pulse: 83 (!) 101 94 91  Resp: 18     Temp:      TempSrc:      SpO2: 97% 96% 97% 98%    Intake/Output Summary (Last 24 hours) at 05/24/2017 1306 Last data filed at 05/24/2017 0036 Gross per 24 hour  Intake -  Output 20 ml  Net -20 ml   There were no vitals filed for this visit.  Exam:  . General: 52 y.o. year-old female well developed well nourished in no acute distress.  Somnolent but arousable to sternal rubs. . Cardiovascular: Regular rate and rhythm with no rubs or gallops.  No thyromegaly or JVD noted.   [place] Kitchen  Respiratory: Clear to auscultation with no wheezes or rales. Good inspiratory effort. . Abdomen: Distended nontender with normal bowel sounds x4 quadrants. . Musculoskeletal: No lower extremity edema. 2/4 pulses in all 4 extremities. . Skin: No ulcerative lesions noted or rashes, . Psychiatry: Unable to  assess due to somnolence.   Data Reviewed: CBC: Recent Labs  Lab 05/23/17 2040 05/24/17 0324  WBC 6.3 5.4  HGB 11.6* 11.8*  HCT 33.2* 34.5*  MCV 105.4* 105.8*  PLT 167 130*   Basic Metabolic Panel: Recent Labs  Lab 05/23/17 2230 05/24/17 0324  NA 138 137  K 3.9 3.6  CL 107 107  CO2 23 22  GLUCOSE 93 81  BUN 10 9  CREATININE 0.80 0.69  CALCIUM 8.7* 8.5*  MG  --  1.6*  PHOS  --  3.9   GFR: CrCl cannot be calculated (Unknown ideal weight.). Liver Function Tests: Recent Labs  Lab 05/23/17 2230 05/24/17 0324  AST 47* 45*  ALT 24 24  ALKPHOS 128* 113  BILITOT 2.3* 2.1*  PROT 7.3 6.9  ALBUMIN 2.0* 1.8*   Recent Labs  Lab 05/23/17 2230  LIPASE 31   Recent Labs  Lab 05/23/17 2040 05/24/17 0324  AMMONIA 204* 69*   Coagulation Profile: Recent Labs  Lab 05/24/17 0324  INR 1.78   Cardiac Enzymes: No results for input(s): CKTOTAL, CKMB, CKMBINDEX, TROPONINI in the last 168 hours. BNP (last 3 results) No results for input(s): PROBNP in the last 8760 hours. HbA1C: No results for input(s): HGBA1C in the last 72 hours. CBG: Recent Labs  Lab 05/23/17 2044  GLUCAP 89   Lipid Profile: No results for input(s): CHOL, HDL, LDLCALC, TRIG, CHOLHDL, LDLDIRECT in the last 72 hours. Thyroid Function Tests: Recent Labs    05/24/17 0324  TSH 4.448   Anemia Panel: No results for input(s): VITAMINB12, FOLATE, FERRITIN, TIBC, IRON, RETICCTPCT in the last 72 hours. Urine analysis:    Component Value Date/Time   COLORURINE YELLOW 05/23/2017 2040   APPEARANCEUR CLEAR 05/23/2017 2040   LABSPEC 1.011 05/23/2017 2040   PHURINE 8.0 05/23/2017 2040   GLUCOSEU NEGATIVE 05/23/2017 2040   HGBUR NEGATIVE 05/23/2017 2040   BILIRUBINUR NEGATIVE 05/23/2017 2040   KETONESUR NEGATIVE 05/23/2017 2040   PROTEINUR NEGATIVE 05/23/2017 2040   UROBILINOGEN 4.0 (H) 03/16/2017 1041   NITRITE NEGATIVE 05/23/2017 2040   LEUKOCYTESUR NEGATIVE 05/23/2017 2040   Sepsis  Labs: (procalcitonin:4,lacticidven:4)  ) Recent Results (from the past 240 hour(s))  MRSA PCR Screening     Status: None   Collection Time: 05/24/17  2:58 AM  Result Value Ref Range Status   MRSA by PCR NEGATIVE NEGATIVE Final    Comment:        The GeneXpert MRSA Assay (FDA approved for NASAL specimens only), is one component of a comprehensive MRSA colonization surveillance program. It is not intended to diagnose MRSA infection nor to guide or monitor treatment for MRSA infections. Performed at [facility], [address] [place], [place] [address]       Studies: Ct Head Wo Contrast  Result Date: 05/24/2017 CLINICAL DATA:  Encephalopathy.  Altered mental status. EXAM: CT HEAD WITHOUT CONTRAST TECHNIQUE: Contiguous axial images were obtained from the base of the skull through the vertex without intravenous contrast. COMPARISON:  04/19/2017 FINDINGS: Limited by motion artifacts. Brain: No large vascular territory infarct, hemorrhage, midline shift or edema. No hydrocephalus. Midline fourth ventricle and basal cisterns without effacement. Vascular: No hyperdense vessel sign. Tiny foci of intravascular  air likely iatrogenic from IV catheterization. Skull: Intact Sinuses/Orbits: Nonacute Other: None IMPRESSION: Chronic stable appearance of the brain. No acute intracranial abnormality. Electronically Signed   By: Tollie Eth M.D.   On: 05/24/2017 00:13   Ct Abdomen Pelvis W Contrast  Result Date: 05/24/2017 CLINICAL DATA:  Altered mental status. Complicated hernia. History of hepatitis-C and cirrhosis. EXAM: CT ABDOMEN AND PELVIS WITH CONTRAST TECHNIQUE: Multidetector CT imaging of the abdomen and pelvis was performed using the standard protocol following bolus administration of intravenous contrast. CONTRAST:  ISOVUE-300 IOPAMIDOL (ISOVUE-300) INJECTION 61% COMPARISON:  04/19/2017 FINDINGS: Lower chest: Chronic stable small right effusion with  atelectasis. Included heart is top normal. No pericardial effusion. Hepatobiliary: Morphologic changes of hepatic cirrhosis with surface nodularity and small shrunken appearance of the liver. No space-occupying mass. Cholecystectomy clips are present in the gallbladder fossa. Recannulized umbilical vein. Pancreas: Normal without ductal dilatation or mass. Spleen: No splenomegaly. Adrenals/Urinary Tract: Unremarkable adrenal glands, kidneys and bladder. No nephrolithiasis, enhancing renal mass nor obstructive uropathy. Stomach/Bowel: Decompressed stomach. Nonobstructed, nondistended small bowel. Scattered colonic diverticulosis without acute diverticulitis. Vascular/Lymphatic: Aortoiliac and branch vessel atherosclerosis without aneurysm. No lymphadenopathy. Reproductive: Uterus and adnexa are normal. Other: Moderate to large periumbilical hernia containing septated loculated fluid, currently measuring 12.3 cm transverse by 6.2 cm AP versus 11.7 cm x 6.2 cm previously. No definite evidence of bowel herniation or incarceration. Moderate ascites. Musculoskeletal: No acute or significant osseous findings. IMPRESSION: 1. Unchanged small right pleural effusion with atelectasis. 2. Redemonstration of hepatic cirrhosis and moderate volume of ascites. 3. Slightly larger periumbilical fluid containing hernia, currently estimated at 12.3 x 6.2 cm versus 11.7 x 6.2 cm previously. Electronically Signed   By: Tollie Eth M.D.   On: 05/24/2017 00:11   Dg Chest Portable 1 View  Result Date: 05/23/2017 CLINICAL DATA:  Altered mental status EXAM: PORTABLE CHEST 1 VIEW COMPARISON:  03/25/2017 FINDINGS: Heart is borderline in size. Mild vascular congestion and bibasilar atelectasis. No effusions or acute bony abnormality. IMPRESSION: Vascular congestion, bibasilar atelectasis. Electronically Signed   By: Charlett Nose M.D.   On: 05/23/2017 21:23    Scheduled Meds: . folic acid  1 mg Oral Daily  . furosemide  40 mg Oral Daily   . lactulose  30 g Oral TID  . multivitamin with minerals  1 tablet Oral Daily  . nicotine  21 mg Transdermal Daily  . sodium chloride flush  3 mL Intravenous Q12H  . spironolactone  100 mg Oral Daily  . thiamine  100 mg Oral Daily    Continuous Infusions: . sodium chloride       LOS: 0 days     Darlin Drop, MD [facility] Pager [phone]  If 7PM-7AM, please contact night-coverage [email or web address] Password [email or web address] 05/24/2017, 1:06 PM
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
## 2017-05-25 ENCOUNTER — Other Ambulatory Visit: Payer: Self-pay

## 2017-05-25 ENCOUNTER — Encounter (HOSPITAL_COMMUNITY): Payer: Self-pay

## 2017-05-25 LAB — COMPREHENSIVE METABOLIC PANEL
ALBUMIN: 1.7 g/dL — AB (ref 3.5–5.0)
ALT: 21 U/L (ref 14–54)
ANION GAP: 7 (ref 5–15)
AST: 43 U/L — AB (ref 15–41)
Alkaline Phosphatase: 114 U/L (ref 38–126)
BILIRUBIN TOTAL: 1.7 mg/dL — AB (ref 0.3–1.2)
BUN: 9 mg/dL (ref 6–20)
CHLORIDE: 104 mmol/L (ref 101–111)
CO2: 23 mmol/L (ref 22–32)
Calcium: 8.1 mg/dL — ABNORMAL LOW (ref 8.9–10.3)
Creatinine, Ser: 0.84 mg/dL (ref 0.44–1.00)
GFR calc Af Amer: >60 mL/min (ref >60)
GFR calc non Af Amer: >60 mL/min (ref >60)
GLUCOSE: 98 mg/dL (ref 65–99)
POTASSIUM: 3.7 mmol/L (ref 3.5–5.1)
SODIUM: 134 mmol/L — AB (ref 135–145)
TOTAL PROTEIN: 6.8 g/dL (ref 6.5–8.1)

## 2017-05-25 LAB — CBC
HEMATOCRIT: 32.6 % — AB (ref 36.0–46.0)
Hemoglobin: 11.2 g/dL — ABNORMAL LOW (ref 12.0–15.0)
MCH: 36.4 pg — ABNORMAL HIGH (ref 26.0–34.0)
MCHC: 34.4 g/dL (ref 30.0–36.0)
MCV: 105.8 fL — ABNORMAL HIGH (ref 78.0–100.0)
PLATELETS: 144 10*3/uL — AB (ref 150–400)
RBC: 3.08 MIL/uL — ABNORMAL LOW (ref 3.87–5.11)
RDW: 14.5 % (ref 11.5–15.5)
WBC: 5.6 10*3/uL (ref 4.0–10.5)

## 2017-05-25 LAB — AMMONIA: Ammonia: 74 umol/L — ABNORMAL HIGH (ref 9–35)

## 2017-05-25 MED ORDER — GI COCKTAIL ~~LOC~~
30.0000 mL | Freq: Once | ORAL | Status: AC
  Administered 2017-05-25: 30 mL via ORAL
  Filled 2017-05-25: qty 30

## 2017-05-25 NOTE — Progress Notes (Signed)
PROGRESS NOTE  Linda Vaughn ZOX:[id number] DOB: October 21, 1965 DOA: 05/23/2017 PCP: [name], FNP  HPI/Recap of past 24 hours: Linda Vaughn is a 53 y.o. female with medical history significant of cirrhosis secondary to alcohol abuse and hepatitis C, hypertension, depression, polysubstance abuse, Ventral hernia, cocaine abuse  Presented with   altered mental status was found like this by her son today son currently not at bedside no family available.  Patient unable to provide her own history.  1 month ago was admitted by family medicine with worsening confusion secondary to hepatic encephalopathy at that time UDS positive for cocaine and THC. CT of her abdomen was done and was nonacute home lactulose was restarted and patient improved she undergone diagnostic paracentesis showed no evidence of SBP patient has chronic abdominal pain secondary to ventral hernia reportedly undergoing elective evaluation for this is an outpatient during last admission Lasix for was added to help with ascites and her Coreg was stopped secondary to recurrent cocaine abuse.  ED course: Ammonia level about 200.  Given lactulose enema.  Ammonia improved to 69 this morning.  05/24/17: Patient seen and examined at bedside.  She is somnolent however she arouses to sternal rubs.  Unable to provide a history due to somnolence.  05/25/17: somnolent but arouses to voices. no new complaints  Assessment/Plan: Active Problems:   HTN (hypertension)   Depression   Tobacco dependence   Umbilical hernia without obstruction and without gangrene   Ascites   Chronic hepatitis C without hepatic coma (HCC)   Cirrhosis of liver (HCC)   Macrocytic anemia   Acute hepatic encephalopathy  Acute hepatic encephalopathy Appears the patient was noncompliant with her lactulose Ammonia level on admission >200 Given rectal lactulose Ammonia level this morning 69 Continue lactulose 3 times daily Repeat ammonia level in the  morning Reorient as needed Fall precautions  Hyperammonemia Ammonia on presentation >200 Ammonia 74 C/w lactulose Repeat ammonia level  Chronic hepatitis C/alcoholic hepatitis Continue lactulose Continue Spironolactone Continue Lasix Monitor for ascites  Ascites in the setting of cirrhosis of liver Last paracentesis was on 04/20/2017 with 1.3 L removed.  Hypomagnesemia, repleted  Chronic anemia/anemia of chronic disease, stable Baseline Hg 11.8 At baseline No sign of overt bleeding Repeat CBC in the morning  Polysubstance abuse including alcohol CIWA protocol in place Nicotine patch increased Recent UDS positive for cocaine polysubstance cessation once she is more alert Continue thiamine  Chronic thrombocytopenia 2/2 advanced liver disease No sign of overt bleeding Platelet 130 INR 1.78 on 05/24/2017 Continue 2 to monitor for any sign of bleeding   Code Status: full  Family Communication: None at bedside  Disposition Plan: Home when clinically stable   Consultants:  none  Procedures:  none  Antimicrobials:  none  DVT prophylaxis: SCDs due to autoanticoagulation and thrombo-cytopenia   Objective: Vitals:   05/25/17 0500 05/25/17 0600 05/25/17 0800 05/25/17 1200  BP:   104/77 127/75  Pulse:      Resp: (!) Temp:   98.1 F (36.7 C) 98.2 F (36.8 C)  TempSrc:   Axillary Axillary  SpO2:   98% 95%  Weight:        Intake/Output Summary (Last 24 hours) at 05/25/2017 1455 Last data filed at 05/25/2017 8119 Gross per 24 hour  Intake 3 ml  Output 1250 ml  Net -1247 ml   Filed Weights   05/25/17 0324  Weight: 89.9 kg (198 lb 3.1 oz)    Exam:  05/25/17  . General: 52 y.o. year-old female WD WN NAD A&O x 3 . Cardiovascular: RRR no rubs or gallops. No JVD or thyromegaly. Marland Kitchen Respiratory: Clear to auscultation with no wheezes or rales. Good inspiratory effort. . Abdomen: Distended nontender with normal bowel sounds x4  quadrants. . Musculoskeletal: No lower extremity edema. 2/4 pulses in all 4 extremities. . Skin: No ulcerative lesions noted or rashes, . Psychiatry: Unable to assess due to somnolence.   Data Reviewed: CBC: Recent Labs  Lab 05/23/17 2040 05/24/17 0324 05/25/17 0326  WBC 6.3 5.4 5.6  HGB 11.6* 11.8* 11.2*  HCT 33.2* 34.5* 32.6*  MCV 105.4* 105.8* 105.8*  PLT 167 130* 144*   Basic Metabolic Panel: Recent Labs  Lab 05/23/17 2230 05/24/17 0324 05/25/17 0326  NA 138 137 134*  K 3.9 3.6 3.7  CL 107 107 104  CO2 GLUCOSE 93 81 98  BUN CREATININE 0.80 0.69 0.84  CALCIUM 8.7* 8.5* 8.1*  MG  --  1.6*  --   PHOS  --  3.9  --    GFR: Estimated Creatinine Clearance: 77.4 mL/min (by C-G formula based on SCr of 0.84 mg/dL). Liver Function Tests: Recent Labs  Lab 05/23/17 2230 05/24/17 0324 05/25/17 0326  AST 47* 45* 43*  ALT ALKPHOS 128* 113 114  BILITOT 2.3* 2.1* 1.7*  PROT 7.3 6.9 6.8  ALBUMIN 2.0* 1.8* 1.7*   Recent Labs  Lab 05/23/17 2230  LIPASE 31   Recent Labs  Lab 05/23/17 2040 05/24/17 0324 05/25/17 0326  AMMONIA 204* 69* 74*   Coagulation Profile: Recent Labs  Lab 05/24/17 0324  INR 1.78   Cardiac Enzymes: No results for input(s): CKTOTAL, CKMB, CKMBINDEX, TROPONINI in the last 168 hours. BNP (last 3 results) No results for input(s): PROBNP in the last 8760 hours. HbA1C: No results for input(s): HGBA1C in the last 72 hours. CBG: Recent Labs  Lab 05/23/17 2044  GLUCAP 89   Lipid Profile: No results for input(s): CHOL, HDL, LDLCALC, TRIG, CHOLHDL, LDLDIRECT in the last 72 hours. Thyroid Function Tests: Recent Labs    05/24/17 0324  TSH 4.448   Anemia Panel: No results for input(s): VITAMINB12, FOLATE, FERRITIN, TIBC, IRON, RETICCTPCT in the last 72 hours. Urine analysis:    Component Value Date/Time   COLORURINE YELLOW 05/23/2017 2040   APPEARANCEUR CLEAR 05/23/2017 2040   LABSPEC 1.011 05/23/2017  2040   PHURINE 8.0 05/23/2017 2040   GLUCOSEU NEGATIVE 05/23/2017 2040   HGBUR NEGATIVE 05/23/2017 2040   BILIRUBINUR NEGATIVE 05/23/2017 2040   KETONESUR NEGATIVE 05/23/2017 2040   PROTEINUR NEGATIVE 05/23/2017 2040   UROBILINOGEN 4.0 (H) 03/16/2017 1041   NITRITE NEGATIVE 05/23/2017 2040   LEUKOCYTESUR NEGATIVE 05/23/2017 2040   Sepsis Labs: (procalcitonin:4,lacticidven:4)  ) Recent Results (from the past 240 hour(s))  MRSA PCR Screening     Status: None   Collection Time: 05/24/17  2:58 AM  Result Value Ref Range Status   MRSA by PCR NEGATIVE NEGATIVE Final    Comment:        The GeneXpert MRSA Assay (FDA approved for NASAL specimens only), is one component of a comprehensive MRSA colonization surveillance program. It is not intended to diagnose MRSA infection nor to guide or monitor treatment for MRSA infections. Performed at [facility], [address] [place], [place] [address]       Studies: No results found.  Scheduled Meds: . folic acid  1 mg Oral Daily  . furosemide  40 mg Oral Daily  . lactulose  30 g Oral TID  . multivitamin with minerals  1 tablet Oral Daily  . nicotine  21 mg Transdermal Daily  . sodium chloride flush  3 mL Intravenous Q12H  . spironolactone  100 mg Oral Daily  . thiamine  100 mg Oral Daily    Continuous Infusions: . sodium chloride       LOS: 1 day     Darlin Drop, MD [facility] Pager [phone]  If 7PM-7AM, please contact night-coverage [email or web address] Password [email or web address] 05/25/2017, 2:55 PM

## 2017-05-26 ENCOUNTER — Inpatient Hospital Stay (HOSPITAL_COMMUNITY): Payer: Medicaid Other

## 2017-05-26 ENCOUNTER — Encounter (HOSPITAL_COMMUNITY): Payer: Self-pay | Admitting: General Surgery

## 2017-05-26 LAB — AMMONIA: AMMONIA: 94 umol/L — AB (ref 9–35)

## 2017-05-26 LAB — LACTIC ACID, PLASMA: Lactic Acid, Venous: 0.9 mmol/L (ref 0.5–1.9)

## 2017-05-26 MED ORDER — MORPHINE SULFATE (PF) 4 MG/ML IV SOLN
2.0000 mg | INTRAVENOUS | Status: DC | PRN
  Administered 2017-05-26: 2 mg via INTRAVENOUS
  Filled 2017-05-26: qty 1

## 2017-05-26 MED ORDER — POLYETHYLENE GLYCOL 3350 17 G PO PACK: 17.0000 g | PACK | Freq: Every day | ORAL | Status: DC

## 2017-05-26 MED ORDER — IOPAMIDOL (ISOVUE-300) INJECTION 61%
INTRAVENOUS | Status: AC
  Administered 2017-05-26: 100 mL via INTRAVENOUS
  Filled 2017-05-26: qty 100

## 2017-05-26 MED ORDER — IOPAMIDOL (ISOVUE-300) INJECTION 61%
100.0000 mL | Freq: Once | INTRAVENOUS | Status: AC | PRN
  Administered 2017-05-26 (×2): 100 mL via INTRAVENOUS

## 2017-05-26 MED ORDER — TRAMADOL HCL 50 MG PO TABS
50.0000 mg | ORAL_TABLET | Freq: Four times a day (QID) | ORAL | Status: DC | PRN
Start: 2017-05-26 — End: 2017-05-28
  Administered 2017-05-26 – 2017-05-28 (×3): 50 mg via ORAL
  Filled 2017-05-26 (×3): qty 1
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
## 2017-05-26 NOTE — Progress Notes (Addendum)
CSW met with the patient at bedside, explained role and reason for visit-to discuss her substance use. Patient sleeping but easy to arouse.  Patient alert x2. Patent informed CSW "I do not have a problem, I am doing good. I am fine. I am healthy." Patient reports,  "I drink on occassion and drink beer some days."   Patient denies using other substances. CSW inquired if patient had been to [facility] rehab before. Patient states she went to [facility] years ago for treatment, when it was still a facility.   Patient states she is not interested in treatment at this time and wants "to go home and get in my own bed". She reports, "I am tired of laying in this bed."  CSW will make self available, if the patient decides she wants treatment at a different time. CSW signing off for now.   Kathrin Greathouse, Latanya Presser, MSW Clinical Social Worker  [phone] 05/26/2017  2:09 PM

## 2017-05-26 NOTE — Progress Notes (Signed)
PROGRESS NOTE  [name] ZOX:[id number] DOB: 1965/09/25 DOA: 05/23/2017 PCP: [name], FNP  HPI/Recap of past 24 hours: Linda Vaughn is a 52 y.o. female with medical history significant of cirrhosis secondary to alcohol abuse and hepatitis C, hypertension, depression, polysubstance abuse, Ventral hernia, cocaine abuse who presented w AMS. On presentation to the ED ammonia level 204. Admitted for acute hepatic encephalopathy.  Somnolent the past 3 days up until 05/26/17. Complaints of severe abd pain. Stat CT ab pelvis w contrast ordered. Gen surgery consulted. Less likely related to hernia which is reducible.   Assessment/Plan: Active Problems:   HTN (hypertension)   Depression   Tobacco dependence   Umbilical hernia without obstruction and without gangrene   Ascites   Chronic hepatitis C without hepatic coma (HCC)   Cirrhosis of liver (HCC)   Macrocytic anemia   Acute hepatic encephalopathy  Acute hepatic encephalopathy, improving Appears the patient was noncompliant with her lactulose Ammonia level on admission >200 Given rectal lactulose Ammonia level this morning 94 from 74 from 69 Had refused her lactulose in the past 2 days. Took it today. Continue lactulose 3 times daily Repeat ammonia level in the morning Reorient as needed Fall precautions  Hyperammonemia 2/2 to advanced liver failure Ammonia on presentation 204 C/w lactulose Repeat ammonia level in the am Monitor stool output  Intractable abdominal pain/ventral hernia Ventral hernia is reducible Surgery consulted and signed off. Obtain CT abd/pelvis w contrast  Chronic hepC/Alcoholic hepatitis Continue lactulose Continue Spironolactone Continue Lasix Monitor for ascites  Cirrhosis with ascites Last paracentesis was on 04/20/2017 with 1.3 L removed. Abd is distended, obtaining CT abd pel Might need another paracentesis during this admission  Hypomagnesemia, repleted  Anemia of  chronic disease Most likely 2/2 to cirrhosis, stable Baseline Hg 11.8 At baseline No sign of overt bleeding Repeat CBC in the morning  Polysubstance abuse with concern for alcohol withdrawal C/w CIWA C/w Nicotine patch  Recent UDS positive for cocaine Continue thiamine Declined help from CSW for detox  Chronic thrombocytopenia/autoanticoagulation 2/2 advanced liver disease No sign of overt bleeding INR 1.78 on 05/24/2017 Continue to monitor for any sign of bleeding  Chronic ventral hernia, reducible Poor candidate for elective surgery due to advanced liver disease.   Code Status: full  Family Communication: None at bedside  Disposition Plan: Home when clinically stable   Consultants:  Gen surgery  Procedures:  none  Antimicrobials:  none  DVT prophylaxis: SCDs due to autoanticoagulation and thrombo-cytopenia   Objective: Vitals:   05/26/17 0810 05/26/17 0910 05/26/17 1200 05/26/17 1600  BP:  (!) 124/47 109/76 114/76  Pulse: 94 91 80 86  Resp: Temp: 98.7 F (37.1 C) 98.3 F (36.8 C) 98.9 F (37.2 C)   TempSrc: Oral Oral Oral   SpO2: 97% 98% 98% 96%  Weight:      Height:        Intake/Output Summary (Last 24 hours) at 05/26/2017 1739 Last data filed at 05/26/2017 1336 Gross per 24 hour  Intake 740 ml  Output 1350 ml  Net -610 ml   Filed Weights   05/25/17 0324 05/25/17 2000  Weight: 89.9 kg (198 lb 3.1 oz) 91.9 kg (202 lb 9.6 oz)    Exam: 05/26/17  . General: 52 y.o. year-old female WD WN NAD alert and oriented x 3 . Cardiovascular: RRR no rubs or gallops. No JVD or thyromegaly. Mar[place] Kitchen Respiratory: Clear to auscultation with no wheezes or rales. Good  inspiratory effort. . Abdomen: Distended nontender with normal bowel sounds x4 quadrants. . Musculoskeletal: No lower extremity edema. 2/4 pulses in all 4 extremities. . Skin: No ulcerative lesions noted or rashes, . Psychiatry: Unable to assess due to somnolence.   Data  Reviewed: CBC: Recent Labs  Lab 05/23/17 2040 05/24/17 0324 05/25/17 0326  WBC 6.3 5.4 5.6  HGB 11.6* 11.8* 11.2*  HCT 33.2* 34.5* 32.6*  MCV 105.4* 105.8* 105.8*  PLT 167 130* 144*   Basic Metabolic Panel: Recent Labs  Lab 05/23/17 2230 05/24/17 0324 05/25/17 0326  NA 138 137 134*  K 3.9 3.6 3.7  CL 107 107 104  CO2 GLUCOSE 93 81 98  BUN CREATININE 0.80 0.69 0.84  CALCIUM 8.7* 8.5* 8.1*  MG  --  1.6*  --   PHOS  --  3.9  --    GFR: Estimated Creatinine Clearance: 78.4 mL/min (by C-G formula based on SCr of 0.84 mg/dL). Liver Function Tests: Recent Labs  Lab 05/23/17 2230 05/24/17 0324 05/25/17 0326  AST 47* 45* 43*  ALT ALKPHOS 128* 113 114  BILITOT 2.3* 2.1* 1.7*  PROT 7.3 6.9 6.8  ALBUMIN 2.0* 1.8* 1.7*   Recent Labs  Lab 05/23/17 2230  LIPASE 31   Recent Labs  Lab 05/23/17 2040 05/24/17 0324 05/25/17 0326 05/26/17 0759  AMMONIA 204* 69* 74* 94*   Coagulation Profile: Recent Labs  Lab 05/24/17 0324  INR 1.78   Cardiac Enzymes: No results for input(s): CKTOTAL, CKMB, CKMBINDEX, TROPONINI in the last 168 hours. BNP (last 3 results) No results for input(s): PROBNP in the last 8760 hours. HbA1C: No results for input(s): HGBA1C in the last 72 hours. CBG: Recent Labs  Lab 05/23/17 2044  GLUCAP 89   Lipid Profile: No results for input(s): CHOL, HDL, LDLCALC, TRIG, CHOLHDL, LDLDIRECT in the last 72 hours. Thyroid Function Tests: Recent Labs    05/24/17 0324  TSH 4.448   Anemia Panel: No results for input(s): VITAMINB12, FOLATE, FERRITIN, TIBC, IRON, RETICCTPCT in the last 72 hours. Urine analysis:    Component Value Date/Time   COLORURINE YELLOW 05/23/2017 2040   APPEARANCEUR CLEAR 05/23/2017 2040   LABSPEC 1.011 05/23/2017 2040   PHURINE 8.0 05/23/2017 2040   GLUCOSEU NEGATIVE 05/23/2017 2040   HGBUR NEGATIVE 05/23/2017 2040   BILIRUBINUR NEGATIVE 05/23/2017 2040   KETONESUR NEGATIVE 05/23/2017  2040   PROTEINUR NEGATIVE 05/23/2017 2040   UROBILINOGEN 4.0 (H) 03/16/2017 1041   NITRITE NEGATIVE 05/23/2017 2040   LEUKOCYTESUR NEGATIVE 05/23/2017 2040   Sepsis Labs: (procalcitonin:4,lacticidven:4)  ) Recent Results (from the past 240 hour(s))  MRSA PCR Screening     Status: None   Collection Time: 05/24/17  2:58 AM  Result Value Ref Range Status   MRSA by PCR NEGATIVE NEGATIVE Final    Comment:        The GeneXpert MRSA Assay (FDA approved for NASAL specimens only), is one component of a comprehensive MRSA colonization surveillance program. It is not intended to diagnose MRSA infection nor to guide or monitor treatment for MRSA infections. Performed at [facility], [address], [place], [place] [address]       Studies: Ct Abdomen Pelvis W Contrast  Result Date: 05/26/2017 CLINICAL DATA:  Inpatient. Abdominal distension. Known ventral hernia with associated tenderness. EXAM: CT ABDOMEN AND PELVIS WITH CONTRAST TECHNIQUE: Multidetector CT imaging of the abdomen and pelvis was performed using the standard protocol  following bolus administration of intravenous contrast. CONTRAST:  100 cc ISOVUE-300 IOPAMIDOL (ISOVUE-300) INJECTION 61% COMPARISON:  05/23/2017 CT abdomen/pelvis. FINDINGS: Lower chest: Small dependent right pleural effusion with mild dependent right lower lobe atelectasis, stable. Mild lower esophageal varices, stable. Hepatobiliary: Diffusely irregular liver surface compatible with cirrhosis. No liver mass. Cholecystectomy. No biliary ductal dilatation. Pancreas: Normal, with no mass or duct dilation. Spleen: Normal size. No mass. Adrenals/Urinary Tract: Normal adrenals. Normal kidneys with no hydronephrosis and no renal mass. Normal nondistended bladder. Stomach/Bowel: Normal non-distended stomach. Normal caliber small bowel with no small bowel wall thickening. Normal appendix. Mild left colonic diverticulosis with no definite large  bowel wall thickening. Vascular/Lymphatic: Atherosclerotic nonaneurysmal abdominal aorta. Patent portal, splenic, hepatic and renal veins. Stable large paraumbilical varices. Stable small gastric varices. No pathologically enlarged lymph nodes in the abdomen or pelvis. Reproductive: Grossly normal uterus.  No adnexal mass. Other: No pneumoperitoneum. Moderate volume ascites is stable. Large periumbilical ventral abdominal hernia is filled with fluid and multiple mildly thickened internal septations and measures 11.9 x 5.6 x 13.4 cm, previously 12.3 x 6.2 x 13.2 cm, not appreciably changed in size or appearance. Musculoskeletal: No aggressive appearing focal osseous lesions. Mild-to-moderate thoracolumbar spondylosis. IMPRESSION: 1. Large periumbilical ventral abdominal hernia containing multilocular fluid collection with mildly thickened internal septations, not appreciably changed compared to the CT study from 3 days prior. 2. No evidence of bowel obstruction or acute bowel inflammation. Mild left colonic diverticulosis. 3. Hepatic cirrhosis.  No liver masses. 4. Stable moderate volume ascites and gastroesophageal and paraumbilical varices. 5. Stable small dependent right pleural effusion. 6.  Aortic Atherosclerosis (ICD10-I70.0). Electronically Signed   By: Delbert Phenix M.D.   On: 05/26/2017 17:29    Scheduled Meds: . folic acid  1 mg Oral Daily  . furosemide  40 mg Oral Daily  . lactulose  30 g Oral TID  . multivitamin with minerals  1 tablet Oral Daily  . nicotine  21 mg Transdermal Daily  . sodium chloride flush  3 mL Intravenous Q12H  . spironolactone  100 mg Oral Daily  . thiamine  100 mg Oral Daily    Continuous Infusions: . sodium chloride       LOS: 2 days     Darlin Drop, MD [facility] Pager [phone]  If 7PM-7AM, please contact night-coverage [email or web address] Password [facility] 05/26/2017, 5:39 PM

## 2017-05-27 LAB — COMPREHENSIVE METABOLIC PANEL
ALT: 25 U/L (ref 14–54)
ANION GAP: 6 (ref 5–15)
AST: 43 U/L — ABNORMAL HIGH (ref 15–41)
Albumin: 1.8 g/dL — ABNORMAL LOW (ref 3.5–5.0)
Alkaline Phosphatase: 122 U/L (ref 38–126)
BUN: 11 mg/dL (ref 6–20)
CHLORIDE: 105 mmol/L (ref 101–111)
CO2: 23 mmol/L (ref 22–32)
Calcium: 8.1 mg/dL — ABNORMAL LOW (ref 8.9–10.3)
Creatinine, Ser: 1.02 mg/dL — ABNORMAL HIGH (ref 0.44–1.00)
Glucose, Bld: 106 mg/dL — ABNORMAL HIGH (ref 65–99)
POTASSIUM: 3.9 mmol/L (ref 3.5–5.1)
Sodium: 134 mmol/L — ABNORMAL LOW (ref 135–145)
Total Bilirubin: 2.2 mg/dL — ABNORMAL HIGH (ref 0.3–1.2)
Total Protein: 7.3 g/dL (ref 6.5–8.1)

## 2017-05-27 LAB — CBC
HCT: 32.4 % — ABNORMAL LOW (ref 36.0–46.0)
Hemoglobin: 11.1 g/dL — ABNORMAL LOW (ref 12.0–15.0)
MCH: 35.9 pg — AB (ref 26.0–34.0)
MCHC: 34.3 g/dL (ref 30.0–36.0)
MCV: 104.9 fL — ABNORMAL HIGH (ref 78.0–100.0)
PLATELETS: 153 10*3/uL (ref 150–400)
RBC: 3.09 MIL/uL — AB (ref 3.87–5.11)
RDW: 14.4 % (ref 11.5–15.5)
WBC: 5.7 10*3/uL (ref 4.0–10.5)

## 2017-05-27 LAB — AMMONIA: AMMONIA: 79 umol/L — AB (ref 9–35)

## 2017-05-27 NOTE — Progress Notes (Signed)
PROGRESS NOTE    Linda Vaughn  ZOX:[id number] DOB: 07-19-1965 DOA: 05/23/2017 PCP: [name], FNP    Brief Narrative:  52 year old female who presented with altered mental status. She does have a significant past medical history of alcoholic liver cirrhosis, hepatitis C, hypertension, depression and polysubstance abuse. Patient was found significantly alter no family at bedside to provide further information but apparently she did have a recent admission for hepatic encephalopathy complicated by cocaine and THC intoxication. On the initial physical examination blood pressure 108/73, heart rate 88, respiratory rate 19, temperature 98.8, oxygen saturation 97%. Patient was disoriented, non-focal, dry mucous membranes, heart S1-S2 present and rhythmic, lungs clear to auscultation bilaterally, abdomen soft, protuberant, ventral hernia noted, no lower extremity edema.sodium 138, potassium 3.9, chloride 107, bicarbonate 23, glucose 93, BUN 10, creatinine 0.8, AST 47, ALT 24, ammonia 204, white count 6.3, hemoglobin 11.6, hematocrit 33.2, platelets 167. Urinalysis negative for infection. CT of the abdomen with hepatic cirrhosis with moderate volume of ascites. Positive periumbilical fluid containing hernia, 12.36.2 cm. Head CT no acute changes. Chest x-ray with increased lung markings bilaterally. EKG sinus rhythm, normal intervals, normal axis.  Patient was admitted to the hospital with the working diagnosis of acute metabolic encephalopathy due to porto-systemic hepatic encephalopathy.    Assessment & Plan:   Active Problems:   HTN (hypertension)   Depression   Tobacco dependence   Umbilical hernia without obstruction and without gangrene   Ascites   Chronic hepatitis C without hepatic coma (HCC)   Cirrhosis of liver (HCC)   Macrocytic anemia   Acute hepatic encephalopathy   1. Portosystemic encephalopathy in the setting of alcoholic cirrhosis. Patient continue to improve, tolerating  po lactulose well, has mild asterixis. Will transfer to medical ward, discontinue telemetry and will request physical therapy evaluation. Out of bed tid with meals. Continue furosemide and spironolactone.   2. Abdominal ventral hernia. No clinical signs of incarceration, will continue supportive care, follow as outpatient.   3. Hepatitis C. Follow as outpatient.   4. Polysubstance abuse. No clinical signs of withdrawal, will continue neuro checks per unit protocol.    DVT prophylaxis: scd  Code Status:  full Family Communication: no family at the bedside  Disposition Plan:  Pending physical therapy evaluation.    Consultants:     Procedures:     Antimicrobials:       Subjective: Patient is feeling better, positive bowel movements, more awake and alert. Persistent abdominal discomfort due to hernia. No nausea or vomiting, no dyspnea or chest pain.   Objective: Vitals:   05/26/17 2315 05/26/17 2348 05/27/17 0312 05/27/17 0417  BP:  132/84  122/67  Pulse:  88  79  Resp:  15  13  Temp: 98.4 F (36.9 C)  98.5 F (36.9 C)   TempSrc: Axillary  Axillary   SpO2:  97%  93%  Weight:      Height:        Intake/Output Summary (Last 24 hours) at 05/27/2017 0755 Last data filed at 05/27/2017 0600 Gross per 24 hour  Intake 740 ml  Output 1150 ml  Net -410 ml   Filed Weights   05/25/17 0324 05/25/17 2000  Weight: 89.9 kg (198 lb 3.1 oz) 91.9 kg (202 lb 9.6 oz)    Examination:   General: Not in pain or dyspnea, deconditioned  Neurology: Awake. Mild somnolence and alert, non focal  E ENT: no pallor, no icterus, oral mucosa moist Cardiovascular: No JVD. S1-S2 present, rhythmic,  no gallops, rubs, or murmurs. No lower extremity edema. Pulmonary: vesicular breath sounds bilaterally, adequate air movement, no wheezing, rhonchi or rales. Gastrointestinal. Abdomen with no organomegaly, non tender, no rebound or guarding. Positive large ventral hernia.  Skin. No  rashes Musculoskeletal: no joint deformities     Data Reviewed: I have personally reviewed following labs and imaging studies  CBC: Recent Labs  Lab 05/23/17 2040 05/24/17 0324 05/25/17 0326 05/27/17 0306  WBC 6.3 5.4 5.6 5.7  HGB 11.6* 11.8* 11.2* 11.1*  HCT 33.2* 34.5* 32.6* 32.4*  MCV 105.4* 105.8* 105.8* 104.9*  PLT 167 130* 144* 153   Basic Metabolic Panel: Recent Labs  Lab 05/23/17 2230 05/24/17 0324 05/25/17 0326 05/27/17 0306  NA 138 137 134* 134*  K 3.9 3.6 3.7 3.9  CL 107 107 104 105  CO2 GLUCOSE 93 81 98 106*  BUN CREATININE 0.80 0.69 0.84 1.02*  CALCIUM 8.7* 8.5* 8.1* 8.1*  MG  --  1.6*  --   --   PHOS  --  3.9  --   --    GFR: Estimated Creatinine Clearance: 64.5 mL/min (A) (by C-G formula based on SCr of 1.02 mg/dL (H)). Liver Function Tests: Recent Labs  Lab 05/23/17 2230 05/24/17 0324 05/25/17 0326 05/27/17 0306  AST 47* 45* 43* 43*  ALT ALKPHOS 128* 113 114 122  BILITOT 2.3* 2.1* 1.7* 2.2*  PROT 7.3 6.9 6.8 7.3  ALBUMIN 2.0* 1.8* 1.7* 1.8*   Recent Labs  Lab 05/23/17 2230  LIPASE 31   Recent Labs  Lab 05/23/17 2040 05/24/17 0324 05/25/17 0326 05/26/17 0759 05/27/17 0306  AMMONIA 204* 69* 74* 94* 79*   Coagulation Profile: Recent Labs  Lab 05/24/17 0324  INR 1.78   Cardiac Enzymes: No results for input(s): CKTOTAL, CKMB, CKMBINDEX, TROPONINI in the last 168 hours. BNP (last 3 results) No results for input(s): PROBNP in the last 8760 hours. HbA1C: No results for input(s): HGBA1C in the last 72 hours. CBG: Recent Labs  Lab 05/23/17 2044  GLUCAP 89   Lipid Profile: No results for input(s): CHOL, HDL, LDLCALC, TRIG, CHOLHDL, LDLDIRECT in the last 72 hours. Thyroid Function Tests: No results for input(s): TSH, T4TOTAL, FREET4, T3FREE, THYROIDAB in the last 72 hours. Anemia Panel: No results for input(s): VITAMINB12, FOLATE, FERRITIN, TIBC, IRON, RETICCTPCT in the last 72  hours.    Radiology Studies: I have reviewed all of the imaging during this hospital visit personally     Scheduled Meds: . folic acid  1 mg Oral Daily  . furosemide  40 mg Oral Daily  . lactulose  30 g Oral TID  . multivitamin with minerals  1 tablet Oral Daily  . nicotine  21 mg Transdermal Daily  . sodium chloride flush  3 mL Intravenous Q12H  . spironolactone  100 mg Oral Daily  . thiamine  100 mg Oral Daily   Continuous Infusions: . sodium chloride       LOS: 3 days        Mauricio Annett Gula, MD [facility] Pager [phone]

## 2017-05-27 NOTE — Plan of Care (Signed)
Pt counseled about the importance of medication management related to her condition. Informed patient that if she wishes to continue improvement, she will have to take medication as prescribed not just in the hospital, but at home. Patient states that she understands this and will take medications.

## 2017-05-27 NOTE — Progress Notes (Signed)
Hand off report given to nurse Brittney (Rm [phone]). Pt. transported upstairs by wheel chair. Pt. belongings including cell phone, charger , pocket book  & one flip flop sent with her. Pt.  states that she had both pairs of flip flops with her when she arrived on the unit.( But when pt. arrived to the unit she had altered mental status.)

## 2017-05-27 NOTE — Progress Notes (Signed)
Received pt via wheelchair from ICU. Pt denies pain at the time of transfer with no s/s of distress noted. Pts assessment is unchanged from earlier documentation. Pt orientated to the room and policies. Call bell is within reach with bed locked and in lowest position and bed alarm on. Will continue to monitor

## 2017-05-27 NOTE — Progress Notes (Signed)
[facility] Progress Note     Subjective: CC:  States her abdominal pain is gone and she wants to go home. She is upset that no one will fix her hernia.  Has had 2-3 BMs in past 24h.   Objective: Vital signs in last 24 hours: Temp:  [98.3 F (36.8 C)-98.9 F (37.2 C)] 98.5 F (36.9 C) (05/22 0312) Pulse Rate:  [79-94] 79 (05/22 0417) Resp:  [13-19] 13 (05/22 0417) BP: (109-132)/(47-84) 122/67 (05/22 0417) SpO2:  [93 %-98 %] 93 % (05/22 0417) Last BM Date: 05/26/17  Intake/Output from previous day: 05/21 0701 - 05/22 0700 In: 740 [P.O.:740] Out: 1150 [Urine:1150] Intake/Output this shift: No intake/output data recorded.  PE: Gen:  Alert, NAD, cooperative  Card:  Regular rate and rhythm, pedal pulses 2+ BL Pulm:  Normal effort, clear to auscultation bilaterally Abd: Soft, non-tender, large, soft reducible periumbilical hernia without overlying erythema, +BS Skin: warm and dry, no rashes  Psych: A&Ox3   Lab Results:  Recent Labs    05/25/17 0326 05/27/17 0306  WBC 5.6 5.7  HGB 11.2* 11.1*  HCT 32.6* 32.4*  PLT 144* 153   BMET Recent Labs    05/25/17 0326 05/27/17 0306  NA 134* 134*  K 3.7 3.9  CL 104 105  CO2 23 23  GLUCOSE 98 106*  BUN 9 11  CREATININE 0.84 1.02*  CALCIUM 8.1* 8.1*   PT/INR No results for input(s): LABPROT, INR in the last 72 hours. CMP     Component Value Date/Time   NA 134 (L) 05/27/2017 0306   K 3.9 05/27/2017 0306   CL 105 05/27/2017 0306   CO2 23 05/27/2017 0306   GLUCOSE 106 (H) 05/27/2017 0306   BUN 11 05/27/2017 0306   CREATININE 1.02 (H) 05/27/2017 0306   CREATININE 0.73 04/10/2016 1119   CALCIUM 8.1 (L) 05/27/2017 0306   PROT 7.3 05/27/2017 0306   ALBUMIN 1.8 (L) 05/27/2017 0306   AST 43 (H) 05/27/2017 0306   ALT 25 05/27/2017 0306   ALKPHOS 122 05/27/2017 0306   BILITOT 2.2 (H) 05/27/2017 0306   GFRNONAA >60 05/27/2017 0306   GFRNONAA >89 04/10/2016 1119   GFRAA >60 05/27/2017 0306   GFRAA >89  04/10/2016 1119   Lipase     Component Value Date/Time   LIPASE 31 05/23/2017 2230       Studies/Results: Ct Abdomen Pelvis W Contrast  Result Date: 05/26/2017 CLINICAL DATA:  Inpatient. Abdominal distension. Known ventral hernia with associated tenderness. EXAM: CT ABDOMEN AND PELVIS WITH CONTRAST TECHNIQUE: Multidetector CT imaging of the abdomen and pelvis was performed using the standard protocol following bolus administration of intravenous contrast. CONTRAST:  100 cc ISOVUE-300 IOPAMIDOL (ISOVUE-300) INJECTION 61% COMPARISON:  05/23/2017 CT abdomen/pelvis. FINDINGS: Lower chest: Small dependent right pleural effusion with mild dependent right lower lobe atelectasis, stable. Mild lower esophageal varices, stable. Hepatobiliary: Diffusely irregular liver surface compatible with cirrhosis. No liver mass. Cholecystectomy. No biliary ductal dilatation. Pancreas: Normal, with no mass or duct dilation. Spleen: Normal size. No mass. Adrenals/Urinary Tract: Normal adrenals. Normal kidneys with no hydronephrosis and no renal mass. Normal nondistended bladder. Stomach/Bowel: Normal non-distended stomach. Normal caliber small bowel with no small bowel wall thickening. Normal appendix. Mild left colonic diverticulosis with no definite large bowel wall thickening. Vascular/Lymphatic: Atherosclerotic nonaneurysmal abdominal aorta. Patent portal, splenic, hepatic and renal veins. Stable large paraumbilical varices. Stable small gastric varices. No pathologically enlarged lymph nodes in the abdomen or pelvis. Reproductive: Grossly normal uterus.  No  adnexal mass. Other: No pneumoperitoneum. Moderate volume ascites is stable. Large periumbilical ventral abdominal hernia is filled with fluid and multiple mildly thickened internal septations and measures 11.9 x 5.6 x 13.4 cm, previously 12.3 x 6.2 x 13.2 cm, not appreciably changed in size or appearance. Musculoskeletal: No aggressive appearing focal osseous  lesions. Mild-to-moderate thoracolumbar spondylosis. IMPRESSION: 1. Large periumbilical ventral abdominal hernia containing multilocular fluid collection with mildly thickened internal septations, not appreciably changed compared to the CT study from 3 days prior. 2. No evidence of bowel obstruction or acute bowel inflammation. Mild left colonic diverticulosis. 3. Hepatic cirrhosis.  No liver masses. 4. Stable moderate volume ascites and gastroesophageal and paraumbilical varices. 5. Stable small dependent right pleural effusion. 6.  Aortic Atherosclerosis (ICD10-I70.0). Electronically Signed   By: Delbert Phenix M.D.   On: 05/26/2017 17:29    Anti-infectives: Anti-infectives (From admission, onward)   None     Assessment/Plan Acute hepatic encephalopathy  Childs C Cirrhosis  Chronic Hepatitis C HTN Tobacco dependence  Polysubstance abuse  EtOH abuse - CIWA  Anemia    Periumbilical hernia  - soft and reducible, non-tender  - CT Abd/pelvis 5/21 with stable ventral hernia, ascites, and cirrhosis, no signs of SBO/LBO. - this patient has no acute surgical needs. General surgery will sign off. Call as needed with questions or concerns.    LOS: 3 days    Adam Phenix , [facility] Surgery 05/27/2017, 7:33 AM Pager: [phone] Consults: [phone] Mon-Fri 7:00 am-4:30 pm Sat-Sun 7:00 am-11:30 am

## 2017-05-28 LAB — BASIC METABOLIC PANEL
ANION GAP: 7 (ref 5–15)
BUN: 10 mg/dL (ref 6–20)
CHLORIDE: 99 mmol/L — AB (ref 101–111)
CO2: 24 mmol/L (ref 22–32)
CREATININE: 0.79 mg/dL (ref 0.44–1.00)
Calcium: 8 mg/dL — ABNORMAL LOW (ref 8.9–10.3)
GFR calc non Af Amer: >60 mL/min (ref >60)
Glucose, Bld: 98 mg/dL (ref 65–99)
POTASSIUM: 3.7 mmol/L (ref 3.5–5.1)
SODIUM: 130 mmol/L — AB (ref 135–145)

## 2017-05-28 LAB — CBC WITH DIFFERENTIAL/PLATELET
Basophils Absolute: 0 10*3/uL (ref 0.0–0.1)
Basophils Relative: 0 %
Eosinophils Absolute: 0.2 10*3/uL (ref 0.0–0.7)
Eosinophils Relative: 4 %
HCT: 32.5 % — ABNORMAL LOW (ref 36.0–46.0)
Hemoglobin: 11.3 g/dL — ABNORMAL LOW (ref 12.0–15.0)
Lymphocytes Relative: 35 %
Lymphs Abs: 1.9 10*3/uL (ref 0.7–4.0)
MCH: 36.6 pg — ABNORMAL HIGH (ref 26.0–34.0)
MCHC: 34.8 g/dL (ref 30.0–36.0)
MCV: 105.2 fL — ABNORMAL HIGH (ref 78.0–100.0)
Monocytes Absolute: 1.2 10*3/uL — ABNORMAL HIGH (ref 0.1–1.0)
Monocytes Relative: 23 %
Neutro Abs: 2.1 10*3/uL (ref 1.7–7.7)
Neutrophils Relative %: 38 %
Platelets: 144 10*3/uL — ABNORMAL LOW (ref 150–400)
RBC: 3.09 MIL/uL — ABNORMAL LOW (ref 3.87–5.11)
RDW: 14 % (ref 11.5–15.5)
WBC: 5.5 10*3/uL (ref 4.0–10.5)

## 2017-05-28 MED ORDER — OXYCODONE HCL 5 MG PO TABS: 5.0000 mg | ORAL_TABLET | Freq: Four times a day (QID) | ORAL | 0 refills | Status: DC | PRN

## 2017-05-28 NOTE — Discharge Summary (Signed)
Physician Discharge Summary  Linda Vaughn ZOX:[id number] DOB: 1965/12/08 DOA: 05/23/2017  PCP: [name], FNP  Admit date: 05/23/2017 Discharge date: 05/28/2017  Time spent: 35 minutes  Recommendations for Outpatient Follow-up:  1) ensure f/u with primary care physician.  Discharge Diagnoses:  Active Problems:   HTN (hypertension)   Depression   Tobacco dependence   Umbilical hernia without obstruction and without gangrene   Ascites   Chronic hepatitis C without hepatic coma (HCC)   Cirrhosis of liver (HCC)   Macrocytic anemia   Acute hepatic encephalopathy   Discharge Condition: stable  Diet recommendation: low sodium diet  Filed Weights   05/25/17 0324 05/25/17 2000  Weight: 89.9 kg (198 lb 3.1 oz) 91.9 kg (202 lb 9.6 oz)    History of present illness:  52 y.o. female with medical history significant of cirrhosis secondary to alcohol abuse and hepatitis C, hypertension, depression, polysubstance abuse, Ventral hernia, cocaine abuse  Presenting with hepatic encephalopathy  Hospital Course:  Hepatic encephalopathy - resolved with lactulose administration - continue prior to admission medication regimen   Umbilical hernia without obstruction and without gangrene chronic will need to follow-up as an outpatient with surgery for repair  Ascites - Pt reporting some pain. Will d/c with short supply of oxycodone. Suspect continued improvement with lasix and spironolactone if patient is compliant with medication and diet.  Procedures:  none  Consultations:  none  Discharge Exam: Vitals:   05/27/17 1419 05/28/17 0357  BP: 121/73 (!) 103/57  Pulse: 85 79  Resp:  17  Temp: 97.6 F (36.4 C) 98.2 F (36.8 C)  SpO2: 95% 97%    General: Pt in nad, alert and awake Cardiovascular: rrr, no rubs Respiratory: no increased wob, no wheezes  Discharge Instructions   Discharge Instructions    Call MD for:  severe uncontrolled pain   Complete by:  As  directed    Call MD for:  temperature >100.4   Complete by:  As directed    Diet - low sodium heart healthy   Complete by:  As directed    Discharge instructions   Complete by:  As directed    Please be sure to follow up with your primary care physician within the next 1-2 weeks or sooner should any new concerns arise.   Increase activity slowly   Complete by:  As directed      Allergies as of 05/28/2017      Reactions   Aspirin Other (See Comments)   Liver damage    Ibuprofen Other (See Comments)   Liver damage   Tylenol [acetaminophen] Other (See Comments)   Liver damage      Medication List    STOP taking these medications   traMADol 50 MG tablet Commonly known as:  ULTRAM     TAKE these medications   folic acid 1 MG tablet Commonly known as:  FOLVITE TAKE 1 TABLET BY MOUTH EVERY DAY   furosemide 40 MG tablet Commonly known as:  LASIX Take 1 tablet (40 mg total) by mouth daily.   lactulose 10 GM/15ML solution Commonly known as:  CHRONULAC Take 45 mLs (30 g total) by mouth 2 (two) times daily.   oxyCODONE 5 MG immediate release tablet Commonly known as:  Oxy IR/ROXICODONE Take 1 tablet (5 mg total) by mouth every 6 (six) hours as needed for severe pain.   spironolactone 100 MG tablet Commonly known as:  ALDACTONE Take 1 tablet (100 mg total) by mouth daily.  thiamine 100 MG tablet Take 1 tablet (100 mg total) by mouth daily.      Allergies  Allergen Reactions  . Aspirin Other (See Comments)    Liver damage   . Ibuprofen Other (See Comments)    Liver damage  . Tylenol [Acetaminophen] Other (See Comments)    Liver damage      The results of significant diagnostics from this hospitalization (including imaging, microbiology, ancillary and laboratory) are listed below for reference.    Significant Diagnostic Studies: Ct Head Wo Contrast  Result Date: 05/24/2017 CLINICAL DATA:  Encephalopathy.  Altered mental status. EXAM: CT HEAD WITHOUT CONTRAST  TECHNIQUE: Contiguous axial images were obtained from the base of the skull through the vertex without intravenous contrast. COMPARISON:  04/19/2017 FINDINGS: Limited by motion artifacts. Brain: No large vascular territory infarct, hemorrhage, midline shift or edema. No hydrocephalus. Midline fourth ventricle and basal cisterns without effacement. Vascular: No hyperdense vessel sign. Tiny foci of intravascular air likely iatrogenic from IV catheterization. Skull: Intact Sinuses/Orbits: Nonacute Other: None IMPRESSION: Chronic stable appearance of the brain. No acute intracranial abnormality. Electronically Signed   By: Tollie Eth M.D.   On: 05/24/2017 00:13   Ct Abdomen Pelvis W Contrast  Result Date: 05/26/2017 CLINICAL DATA:  Inpatient. Abdominal distension. Known ventral hernia with associated tenderness. EXAM: CT ABDOMEN AND PELVIS WITH CONTRAST TECHNIQUE: Multidetector CT imaging of the abdomen and pelvis was performed using the standard protocol following bolus administration of intravenous contrast. CONTRAST:  100 cc ISOVUE-300 IOPAMIDOL (ISOVUE-300) INJECTION 61% COMPARISON:  05/23/2017 CT abdomen/pelvis. FINDINGS: Lower chest: Small dependent right pleural effusion with mild dependent right lower lobe atelectasis, stable. Mild lower esophageal varices, stable. Hepatobiliary: Diffusely irregular liver surface compatible with cirrhosis. No liver mass. Cholecystectomy. No biliary ductal dilatation. Pancreas: Normal, with no mass or duct dilation. Spleen: Normal size. No mass. Adrenals/Urinary Tract: Normal adrenals. Normal kidneys with no hydronephrosis and no renal mass. Normal nondistended bladder. Stomach/Bowel: Normal non-distended stomach. Normal caliber small bowel with no small bowel wall thickening. Normal appendix. Mild left colonic diverticulosis with no definite large bowel wall thickening. Vascular/Lymphatic: Atherosclerotic nonaneurysmal abdominal aorta. Patent portal, splenic, hepatic and  renal veins. Stable large paraumbilical varices. Stable small gastric varices. No pathologically enlarged lymph nodes in the abdomen or pelvis. Reproductive: Grossly normal uterus.  No adnexal mass. Other: No pneumoperitoneum. Moderate volume ascites is stable. Large periumbilical ventral abdominal hernia is filled with fluid and multiple mildly thickened internal septations and measures 11.9 x 5.6 x 13.4 cm, previously 12.3 x 6.2 x 13.2 cm, not appreciably changed in size or appearance. Musculoskeletal: No aggressive appearing focal osseous lesions. Mild-to-moderate thoracolumbar spondylosis. IMPRESSION: 1. Large periumbilical ventral abdominal hernia containing multilocular fluid collection with mildly thickened internal septations, not appreciably changed compared to the CT study from 3 days prior. 2. No evidence of bowel obstruction or acute bowel inflammation. Mild left colonic diverticulosis. 3. Hepatic cirrhosis.  No liver masses. 4. Stable moderate volume ascites and gastroesophageal and paraumbilical varices. 5. Stable small dependent right pleural effusion. 6.  Aortic Atherosclerosis (ICD10-I70.0). Electronically Signed   By: Delbert Phenix M.D.   On: 05/26/2017 17:29   Ct Abdomen Pelvis W Contrast  Result Date: 05/24/2017 CLINICAL DATA:  Altered mental status. Complicated hernia. History of hepatitis-C and cirrhosis. EXAM: CT ABDOMEN AND PELVIS WITH CONTRAST TECHNIQUE: Multidetector CT imaging of the abdomen and pelvis was performed using the standard protocol following bolus administration of intravenous contrast. CONTRAST:  ISOVUE-300 IOPAMIDOL (ISOVUE-300) INJECTION 61% COMPARISON:  04/19/2017 FINDINGS: Lower chest: Chronic stable small right effusion with atelectasis. Included heart is top normal. No pericardial effusion. Hepatobiliary: Morphologic changes of hepatic cirrhosis with surface nodularity and small shrunken appearance of the liver. No space-occupying mass. Cholecystectomy clips are  present in the gallbladder fossa. Recannulized umbilical vein. Pancreas: Normal without ductal dilatation or mass. Spleen: No splenomegaly. Adrenals/Urinary Tract: Unremarkable adrenal glands, kidneys and bladder. No nephrolithiasis, enhancing renal mass nor obstructive uropathy. Stomach/Bowel: Decompressed stomach. Nonobstructed, nondistended small bowel. Scattered colonic diverticulosis without acute diverticulitis. Vascular/Lymphatic: Aortoiliac and branch vessel atherosclerosis without aneurysm. No lymphadenopathy. Reproductive: Uterus and adnexa are normal. Other: Moderate to large periumbilical hernia containing septated loculated fluid, currently measuring 12.3 cm transverse by 6.2 cm AP versus 11.7 cm x 6.2 cm previously. No definite evidence of bowel herniation or incarceration. Moderate ascites. Musculoskeletal: No acute or significant osseous findings. IMPRESSION: 1. Unchanged small right pleural effusion with atelectasis. 2. Redemonstration of hepatic cirrhosis and moderate volume of ascites. 3. Slightly larger periumbilical fluid containing hernia, currently estimated at 12.3 x 6.2 cm versus 11.7 x 6.2 cm previously. Electronically Signed   By: Tollie Eth M.D.   On: 05/24/2017 00:11   Dg Chest Portable 1 View  Result Date: 05/23/2017 CLINICAL DATA:  Altered mental status EXAM: PORTABLE CHEST 1 VIEW COMPARISON:  03/25/2017 FINDINGS: Heart is borderline in size. Mild vascular congestion and bibasilar atelectasis. No effusions or acute bony abnormality. IMPRESSION: Vascular congestion, bibasilar atelectasis. Electronically Signed   By: Charlett Nose M.D.   On: 05/23/2017 21:23    Microbiology: Recent Results (from the past 240 hour(s))  MRSA PCR Screening     Status: None   Collection Time: 05/24/17  2:58 AM  Result Value Ref Range Status   MRSA by PCR NEGATIVE NEGATIVE Final    Comment:        The GeneXpert MRSA Assay (FDA approved for NASAL specimens only), is one component of  a comprehensive MRSA colonization surveillance program. It is not intended to diagnose MRSA infection nor to guide or monitor treatment for MRSA infections. Performed at [facility], [address] [place], [place] [address]      Labs: Basic Metabolic Panel: Recent Labs  Lab 05/23/17 2230 05/24/17 0324 05/25/17 0326 05/27/17 0306 05/28/17 0611  NA 138 137 134* 134* 130*  K 3.9 3.6 3.7 3.9 3.7  CL 107 107 104 105 99*  CO2 GLUCOSE 93 81 98 106* 98  BUN CREATININE 0.80 0.69 0.84 1.02* 0.79  CALCIUM 8.7* 8.5* 8.1* 8.1* 8.0*  MG  --  1.6*  --   --   --   PHOS  --  3.9  --   --   --    Liver Function Tests: Recent Labs  Lab 05/23/17 2230 05/24/17 0324 05/25/17 0326 05/27/17 0306  AST 47* 45* 43* 43*  ALT ALKPHOS 128* 113 114 122  BILITOT 2.3* 2.1* 1.7* 2.2*  PROT 7.3 6.9 6.8 7.3  ALBUMIN 2.0* 1.8* 1.7* 1.8*   Recent Labs  Lab 05/23/17 2230  LIPASE 31   Recent Labs  Lab 05/23/17 2040 05/24/17 0324 05/25/17 0326 05/26/17 0759 05/27/17 0306  AMMONIA 204* 69* 74* 94* 79*   CBC: Recent Labs  Lab 05/23/17 2040 05/24/17 0324 05/25/17 0326 05/27/17 0306 05/28/17 0611  WBC 6.3 5.4 5.6 5.7 5.5  NEUTROABS  --   --   --   --  2.1  HGB 11.6* 11.8* 11.2* 11.1* 11.3*  HCT 33.2* 34.5* 32.6* 32.4* 32.5*  MCV 105.4* 105.8* 105.8* 104.9* 105.2*  PLT 167 130* 144* 153 144*   Cardiac Enzymes: No results for input(s): CKTOTAL, CKMB, CKMBINDEX, TROPONINI in the last 168 hours. BNP: BNP (last 3 results) No results for input(s): BNP in the last 8760 hours.  ProBNP (last 3 results) No results for input(s): PROBNP in the last 8760 hours.  CBG: Recent Labs  Lab 05/23/17 2044  GLUCAP 89       Signed:  Penny Pia MD.  [facility] 05/28/2017, 12:14 PM

## 2017-05-28 NOTE — Evaluation (Signed)
Physical Therapy Evaluation Patient Details Name: Linda Vaughn MRN: [id number] DOB: 16-May-1965 Today's Date: 05/28/2017   History of Present Illness  52 year old female who presented with altered mental status. She does have a significant past medical history of alcoholic liver cirrhosis, hepatitis C, hypertension, depression and polysubstance abuse. Patient was found significantly alter no family at bedside to provide further information but apparently she did have a recent admission for hepatic encephalopathy complicated by cocaine and THC intoxication  Clinical Impression  Pt ambulated 240' with mildly unsteady gait, but no overt loss of balance, which pt reports is baseline. She denies h/o falls and refused use of an assistive device. Pt appears to be at baseline,  No further PT indicated, will sign off.     Follow Up Recommendations No PT follow up    Equipment Recommendations  None recommended by PT    Recommendations for Other Services       Precautions / Restrictions Precautions Precautions: Fall Precaution Comments: mildly unsteady gait, pt denies h/o falls however poor historian Restrictions Weight Bearing Restrictions: No      Mobility  Bed Mobility Overal bed mobility: Independent                Transfers                    Ambulation/Gait Ambulation/Gait assistance: Independent;Modified independent (Device/Increase time) Ambulation Distance (Feet): 240 Feet Assistive device: None Gait Pattern/deviations: Wide base of support;Staggering left;Staggering right     General Gait Details: intermittently reaches for walls/rails, mild staggering L/R but no loss of balance, pt reports this is baseline, pt refused AD, denies h/o falls  Stairs            Wheelchair Mobility    Modified Rankin (Stroke Patients Only)       Balance Overall balance assessment: Needs assistance   Sitting balance-Leahy Scale: Good       Standing  balance-Leahy Scale: Good                               Pertinent Vitals/Pain Pain Assessment: No/denies pain    Home Living Family/patient expects to be discharged to:: Private residence Living Arrangements: Spouse/significant other Available Help at Discharge: Available PRN/intermittently   Home Access: Level entry     Home Layout: One level Home Equipment: None      Prior Function Level of Independence: Independent               Hand Dominance        Extremity/Trunk Assessment   Upper Extremity Assessment Upper Extremity Assessment: Overall WFL for tasks assessed    Lower Extremity Assessment Lower Extremity Assessment: Overall WFL for tasks assessed    Cervical / Trunk Assessment Cervical / Trunk Assessment: Normal  Communication   Communication: No difficulties  Cognition Arousal/Alertness: Awake/alert Behavior During Therapy: WFL for tasks assessed/performed Overall Cognitive Status: Within Functional Limits for tasks assessed                                        General Comments      Exercises     Assessment/Plan    PT Assessment Patent does not need any further PT services  PT Problem List         PT Treatment Interventions  PT Goals (Current goals can be found in the Care Plan section)  Acute Rehab PT Goals PT Goal Formulation: All assessment and education complete, DC therapy    Frequency     Barriers to discharge        Co-evaluation               AM-PAC PT "6 Clicks" Daily Activity  Outcome Measure Difficulty turning over in bed (including adjusting bedclothes, sheets and blankets)?: None Difficulty moving from lying on back to sitting on the side of the bed? : None Difficulty sitting down on and standing up from a chair with arms (e.g., wheelchair, bedside commode, etc,.)?: None Help needed moving to and from a bed to chair (including a wheelchair)?: None Help needed walking in  hospital room?: None Help needed climbing 3-5 steps with a railing? : None 6 Click Score: 24    End of Session Equipment Utilized During Treatment: Gait belt Activity Tolerance: Patient tolerated treatment well Patient left: in bed Nurse Communication: Mobility status      Time: 1610-9604 PT Time Calculation (min) (ACUTE ONLY): 12 min   Charges:   PT Evaluation $PT Eval Low Complexity: 1 Low     PT G Codes:          Tamala Ser 05/28/2017, 10:16 AM [phone]

## 2017-06-02 ENCOUNTER — Other Ambulatory Visit: Payer: Self-pay | Admitting: Family Medicine

## 2017-06-03 ENCOUNTER — Telehealth: Payer: Self-pay

## 2017-06-05 ENCOUNTER — Ambulatory Visit: Payer: Medicaid Other | Admitting: Family Medicine

## 2017-06-11 ENCOUNTER — Ambulatory Visit (INDEPENDENT_AMBULATORY_CARE_PROVIDER_SITE_OTHER): Payer: Medicaid Other | Admitting: Family Medicine

## 2017-06-11 ENCOUNTER — Other Ambulatory Visit: Payer: Self-pay

## 2017-06-11 ENCOUNTER — Encounter: Payer: Self-pay | Admitting: Family Medicine

## 2017-06-11 VITALS — BP 117/86 | HR 82 | Temp 98.1°F | Ht 60.0 in | Wt 193.0 lb

## 2017-06-11 DIAGNOSIS — B182 Chronic viral hepatitis C: Secondary | ICD-10-CM | POA: Diagnosis not present

## 2017-06-11 DIAGNOSIS — R188 Other ascites: Secondary | ICD-10-CM | POA: Diagnosis not present

## 2017-06-11 DIAGNOSIS — K7460 Unspecified cirrhosis of liver: Secondary | ICD-10-CM | POA: Diagnosis not present

## 2017-06-11 DIAGNOSIS — K429 Umbilical hernia without obstruction or gangrene: Secondary | ICD-10-CM | POA: Diagnosis not present

## 2017-06-11 LAB — POCT URINALYSIS DIP (MANUAL ENTRY)
Bilirubin, UA: NEGATIVE
Blood, UA: NEGATIVE
GLUCOSE UA: NEGATIVE mg/dL
Ketones, POC UA: NEGATIVE mg/dL
NITRITE UA: NEGATIVE
PROTEIN UA: NEGATIVE mg/dL
SPEC GRAV UA: 1.015 (ref 1.010–1.025)
UROBILINOGEN UA: 0.2 U/dL
pH, UA: 7 (ref 5.0–8.0)
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
## 2017-06-11 NOTE — Patient Instructions (Signed)
[phone] for gastroenterology follow up of hepatitis C     Open Hernia Repair, Adult, Care After These instructions give you information about caring for yourself after your procedure. Your doctor may also give you more specific instructions. If you have problems or questions, contact your doctor. Follow these instructions at home: Surgical cut (incision) care   Follow instructions from your doctor about how to take care of your surgical cut area. Make sure you: ? Wash your hands with soap and water before you change your bandage (dressing). If you cannot use soap and water, use hand sanitizer. ? Change your bandage as told by your doctor. ? Leave stitches (sutures), skin glue, or skin tape (adhesive) strips in place. They may need to stay in place for 2 weeks or longer. If tape strips get loose and curl up, you may trim the loose edges. Do not remove tape strips completely unless your doctor says it is okay.  Check your surgical cut every day for signs of infection. Check for: ? More redness, swelling, or pain. ? More fluid or blood. ? Warmth. ? Pus or a bad smell. Activity  Do not drive or use heavy machinery while taking prescription pain medicine. Do not drive until your doctor says it is okay.  Until your doctor says it is okay: ? Do not lift anything that is heavier than 10 lb (4.5 kg). ? Do not play contact sports.  Return to your normal activities as told by your doctor. Ask your doctor what activities are safe. General instructions  To prevent or treat having a hard time pooping (constipation) while you are taking prescription pain medicine, your doctor may recommend that you: ? Drink enough fluid to keep your pee (urine) clear or pale yellow. ? Take over-the-counter or prescription medicines. ? Eat foods that are high in fiber, such as fresh fruits and vegetables, whole grains, and beans. ? Limit foods that are high in fat and processed sugars, such as fried and sweet  foods.  Take over-the-counter and prescription medicines only as told by your doctor.  Do not take baths, swim, or use a hot tub until your doctor says it is okay.  Keep all follow-up visits as told by your doctor. This is important. Contact a doctor if:  You develop a rash.  You have more redness, swelling, or pain around your surgical cut.  You have more fluid or blood coming from your surgical cut.  Your surgical cut feels warm to the touch.  You have pus or a bad smell coming from your surgical cut.  You have a fever or chills.  You have blood in your poop (stool).  You have not pooped in 2-3 days.  Medicine does not help your pain. Get help right away if:  You have chest pain or you are short of breath.  You feel light-headed.  You feel weak and dizzy (feel faint).  You have very bad pain.  You throw up (vomit) and your pain is worse. This information is not intended to replace advice given to you by your health care provider. Make sure you discuss any questions you have with your health care provider. Document Released: 01/13/2014 Document Revised: 07/13/2015 Document Reviewed: 06/06/2015 Elsevier Interactive Patient Education  [employer]2018 Elsevier Inc.

## 2017-06-12 ENCOUNTER — Other Ambulatory Visit: Payer: Self-pay | Admitting: Family Medicine

## 2017-06-12 DIAGNOSIS — K7031 Alcoholic cirrhosis of liver with ascites: Secondary | ICD-10-CM

## 2017-06-12 LAB — CBC WITH DIFFERENTIAL/PLATELET
BASOS ABS: 0 10*3/uL (ref 0.0–0.2)
BASOS: 1 %
EOS (ABSOLUTE): 0.2 10*3/uL (ref 0.0–0.4)
EOS: 4 %
HEMATOCRIT: 42.3 % (ref 34.0–46.6)
HEMOGLOBIN: 14.2 g/dL (ref 11.1–15.9)
IMMATURE GRANS (ABS): 0 10*3/uL (ref 0.0–0.1)
Immature Granulocytes: 0 %
Lymphocytes Absolute: 1.9 10*3/uL (ref 0.7–3.1)
Lymphs: 43 %
MCH: 36.2 pg — AB (ref 26.6–33.0)
MCHC: 33.6 g/dL (ref 31.5–35.7)
MCV: 108 fL — AB (ref 79–97)
MONOCYTES: 17 %
Monocytes Absolute: 0.8 10*3/uL (ref 0.1–0.9)
NEUTROS ABS: 1.5 10*3/uL (ref 1.4–7.0)
Neutrophils: 35 %
Platelets: 163 10*3/uL (ref 150–450)
RBC: 3.92 x10E6/uL (ref 3.77–5.28)
RDW: 15.1 % (ref 12.3–15.4)
WBC: 4.4 10*3/uL (ref 3.4–10.8)

## 2017-06-12 LAB — COMPREHENSIVE METABOLIC PANEL
ALBUMIN: 2.4 g/dL — AB (ref 3.5–5.5)
ALT: 25 IU/L (ref 0–32)
AST: 48 IU/L — ABNORMAL HIGH (ref 0–40)
Albumin/Globulin Ratio: 0.4 — ABNORMAL LOW (ref 1.2–2.2)
Alkaline Phosphatase: 170 IU/L — ABNORMAL HIGH (ref 39–117)
BILIRUBIN TOTAL: 1.9 mg/dL — AB (ref 0.0–1.2)
BUN / CREAT RATIO: 13 (ref 9–23)
BUN: 10 mg/dL (ref 6–24)
CHLORIDE: 103 mmol/L (ref 96–106)
CO2: 23 mmol/L (ref 20–29)
CREATININE: 0.76 mg/dL (ref 0.57–1.00)
Calcium: 8.4 mg/dL — ABNORMAL LOW (ref 8.7–10.2)
GFR calc Af Amer: 104 mL/min/{1.73_m2} (ref >59)
GFR calc non Af Amer: 90 mL/min/{1.73_m2} (ref >59)
GLOBULIN, TOTAL: 5.8 g/dL — AB (ref 1.5–4.5)
GLUCOSE: 80 mg/dL (ref 65–99)
Potassium: 4 mmol/L (ref 3.5–5.2)
SODIUM: 137 mmol/L (ref 134–144)
TOTAL PROTEIN: 8.2 g/dL (ref 6.0–8.5)

## 2017-06-12 LAB — AMMONIA: Ammonia: 149 ug/dL (ref 19–87)

## 2017-06-12 MED ORDER — LACTULOSE 10 GM/15ML PO SOLN: 30.0000 g | Freq: Two times a day (BID) | ORAL | 1 refills | Status: DC

## 2017-06-12 NOTE — Progress Notes (Signed)
Meds ordered this encounter  Medications  . lactulose (CHRONULAC) 10 GM/15ML solution    Sig: Take 45 mLs (30 g total) by mouth 2 (two) times daily.    Dispense:  1892 mL    Refill:  1     Nolon NationsLachina Moore Hollis  MSN, FNP-C Patient [facility] [address]  [place], [place] [id number] [phone]

## 2017-06-23 ENCOUNTER — Other Ambulatory Visit: Payer: Self-pay | Admitting: Family Medicine

## 2017-06-23 DIAGNOSIS — K7031 Alcoholic cirrhosis of liver with ascites: Secondary | ICD-10-CM

## 2017-06-27 ENCOUNTER — Other Ambulatory Visit: Payer: Self-pay | Admitting: Family Medicine

## 2017-06-27 DIAGNOSIS — K7031 Alcoholic cirrhosis of liver with ascites: Secondary | ICD-10-CM

## 2017-06-29 ENCOUNTER — Telehealth: Payer: Self-pay

## 2017-07-02 ENCOUNTER — Other Ambulatory Visit: Payer: Self-pay | Admitting: Family Medicine

## 2017-07-22 ENCOUNTER — Other Ambulatory Visit: Payer: Self-pay | Admitting: Family Medicine

## 2017-07-30 ENCOUNTER — Other Ambulatory Visit: Payer: Self-pay | Admitting: Family Medicine

## 2017-08-18 ENCOUNTER — Other Ambulatory Visit: Payer: Self-pay | Admitting: Family Medicine

## 2017-09-11 ENCOUNTER — Ambulatory Visit: Payer: Medicaid Other | Admitting: Family Medicine

## 2017-09-16 ENCOUNTER — Ambulatory Visit: Payer: Medicaid Other | Admitting: Family Medicine

## 2017-09-26 ENCOUNTER — Other Ambulatory Visit: Payer: Self-pay | Admitting: Family Medicine

## 2017-09-26 ENCOUNTER — Other Ambulatory Visit: Payer: Self-pay | Admitting: Internal Medicine

## 2017-09-27 ENCOUNTER — Other Ambulatory Visit: Payer: Self-pay | Admitting: Family Medicine

## 2017-09-27 DIAGNOSIS — K7031 Alcoholic cirrhosis of liver with ascites: Secondary | ICD-10-CM

## 2017-10-07 ENCOUNTER — Other Ambulatory Visit: Payer: Self-pay | Admitting: Internal Medicine

## 2017-10-07 ENCOUNTER — Other Ambulatory Visit: Payer: Self-pay | Admitting: Family Medicine

## 2017-10-12 ENCOUNTER — Emergency Department (HOSPITAL_COMMUNITY)
Admission: EM | Admit: 2017-10-12 | Discharge: 2017-10-12 | Disposition: A | Discharge status: Home / Self Care | Payer: Medicaid Other | Attending: Emergency Medicine | Admitting: Emergency Medicine

## 2017-10-12 ENCOUNTER — Emergency Department (HOSPITAL_COMMUNITY): Payer: Medicaid Other

## 2017-10-12 ENCOUNTER — Ambulatory Visit: Payer: Medicaid Other | Admitting: Family Medicine

## 2017-10-12 ENCOUNTER — Encounter (HOSPITAL_COMMUNITY): Payer: Self-pay | Admitting: Oncology

## 2017-10-12 DIAGNOSIS — K7031 Alcoholic cirrhosis of liver with ascites: Secondary | ICD-10-CM | POA: Insufficient documentation

## 2017-10-12 DIAGNOSIS — Z79899 Other long term (current) drug therapy: Secondary | ICD-10-CM | POA: Diagnosis not present

## 2017-10-12 DIAGNOSIS — R1084 Generalized abdominal pain: Secondary | ICD-10-CM | POA: Diagnosis present

## 2017-10-12 DIAGNOSIS — I10 Essential (primary) hypertension: Secondary | ICD-10-CM | POA: Diagnosis not present

## 2017-10-12 DIAGNOSIS — R188 Other ascites: Secondary | ICD-10-CM

## 2017-10-12 DIAGNOSIS — Z8719 Personal history of other diseases of the digestive system: Secondary | ICD-10-CM

## 2017-10-12 DIAGNOSIS — F17210 Nicotine dependence, cigarettes, uncomplicated: Secondary | ICD-10-CM | POA: Insufficient documentation

## 2017-10-12 HISTORY — PX: IR PARACENTESIS: IMG2679

## 2017-10-12 LAB — CBC WITH DIFFERENTIAL/PLATELET
ABS IMMATURE GRANULOCYTES: 0 10*3/uL (ref 0.0–0.1)
BASOS PCT: 1 %
Basophils Absolute: 0.1 10*3/uL (ref 0.0–0.1)
EOS ABS: 0.3 10*3/uL (ref 0.0–0.7)
Eosinophils Relative: 5 %
HCT: 42.1 % (ref 36.0–46.0)
Hemoglobin: 14.1 g/dL (ref 12.0–15.0)
IMMATURE GRANULOCYTES: 1 %
LYMPHS ABS: 2.4 10*3/uL (ref 0.7–4.0)
Lymphocytes Relative: 45 %
MCH: 36.2 pg — AB (ref 26.0–34.0)
MCHC: 33.5 g/dL (ref 30.0–36.0)
MCV: 108.2 fL — AB (ref 78.0–100.0)
MONO ABS: 0.8 10*3/uL (ref 0.1–1.0)
MONOS PCT: 15 %
NEUTROS PCT: 33 %
Neutro Abs: 1.7 10*3/uL (ref 1.7–7.7)
Platelets: 157 10*3/uL (ref 150–400)
RBC: 3.89 MIL/uL (ref 3.87–5.11)
RDW: 13.8 % (ref 11.5–15.5)
WBC: 5.2 10*3/uL (ref 4.0–10.5)

## 2017-10-12 LAB — BASIC METABOLIC PANEL
ANION GAP: 6 (ref 5–15)
BUN: 5 mg/dL — AB (ref 6–20)
CHLORIDE: 105 mmol/L (ref 98–111)
CO2: 24 mmol/L (ref 22–32)
Calcium: 8.5 mg/dL — ABNORMAL LOW (ref 8.9–10.3)
Creatinine, Ser: 0.76 mg/dL (ref 0.44–1.00)
GFR calc Af Amer: >60 mL/min (ref >60)
GLUCOSE: 111 mg/dL — AB (ref 70–99)
POTASSIUM: 3.2 mmol/L — AB (ref 3.5–5.1)
Sodium: 135 mmol/L (ref 135–145)

## 2017-10-12 LAB — HEPATIC FUNCTION PANEL
ALK PHOS: 149 U/L — AB (ref 38–126)
ALT: 21 U/L (ref 0–44)
AST: 58 U/L — AB (ref 15–41)
Albumin: 2.1 g/dL — ABNORMAL LOW (ref 3.5–5.0)
BILIRUBIN DIRECT: 0.8 mg/dL — AB (ref 0.0–0.2)
BILIRUBIN INDIRECT: 1.2 mg/dL — AB (ref 0.3–0.9)
Total Bilirubin: 2 mg/dL — ABNORMAL HIGH (ref 0.3–1.2)
Total Protein: 8.2 g/dL — ABNORMAL HIGH (ref 6.5–8.1)

## 2017-10-12 LAB — AMMONIA: AMMONIA: 66 umol/L — AB (ref 9–35)

## 2017-10-12 LAB — LIPASE, BLOOD: Lipase: 27 U/L (ref 11–51)

## 2017-10-12 LAB — PROTIME-INR
INR: 1.62
Prothrombin Time: 19.1 seconds — ABNORMAL HIGH (ref 11.4–15.2)

## 2017-10-12 MED ORDER — MORPHINE SULFATE (PF) 4 MG/ML IV SOLN
4.0000 mg | Freq: Once | INTRAVENOUS | Status: AC
  Administered 2017-10-12: 4 mg via INTRAVENOUS
  Filled 2017-10-12: qty 1

## 2017-10-12 MED ORDER — LIDOCAINE HCL (PF) 2 % IJ SOLN
INTRAMUSCULAR | Status: AC
  Filled 2017-10-12: qty 20

## 2017-10-12 MED ORDER — LIDOCAINE HCL (PF) 2 % IJ SOLN
INTRAMUSCULAR | Status: AC | PRN
  Administered 2017-10-12: 15 mL

## 2017-10-12 MED ORDER — ONDANSETRON HCL 4 MG/2ML IJ SOLN
4.0000 mg | Freq: Once | INTRAMUSCULAR | Status: AC
  Administered 2017-10-12: 4 mg via INTRAVENOUS
  Filled 2017-10-12: qty 2

## 2017-10-12 MED ORDER — LIDOCAINE HCL (PF) 2 % IJ SOLN
INTRAMUSCULAR | Status: AC
  Filled 2017-10-12: qty 10

## 2017-10-12 NOTE — ED Triage Notes (Signed)
Pt bib gcems from home d/t abdominal pain.  Pt also c/o of shob.  Pt has had to have paracentesis in the past when this has happened.  Pt is A&O x 4.
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
## 2017-10-12 NOTE — ED Notes (Signed)
Pt staets she understands instructions and home with friend. Walks easily in room. Taking po flluids with no problems. resp even and non labored.

## 2017-10-12 NOTE — ED Notes (Addendum)
Pt to ir

## 2017-10-12 NOTE — ED Provider Notes (Signed)
Blood pressure (!) 109/91, pulse 88, temperature 98.7 F (37.1 C), temperature source Oral, resp. rate 10, height 5' (1.524 m), weight 95.3 kg, SpO2 93 %.  Assuming care from Dr. Judd Lien.  In short, Linda Vaughn is a 52 y.o. female with a chief complaint of Abdominal Pain .  Refer to the original H&P for additional details.  The current plan of care is to f/u patient after therapeutic paracentesis. No concern clinically for SBP.  10:49 AM Patient returned from Radiology. 5.5 L taken off and patient has had resolution of abdominal pain. Plan for discharge and PCP follow up.   Alona Bene, MD   Maia Plan, MD 10/12/17 1050

## 2017-10-12 NOTE — ED Notes (Signed)
Pt return from IR states pain free and no drainage/bleeding noted at ir sit on right abd. Positive bowels sounds hernia noted

## 2017-10-12 NOTE — Procedures (Signed)
PROCEDURE SUMMARY:  Successful image-guided paracentesis from the right lower abdomen.  Yielded 5.5 liters of clear yellow fluid.  No immediate complications.  Patient tolerated well.   Specimen was not sent for labs.  Villa Herb PA-C 10/12/2017 9:25 AM

## 2017-10-12 NOTE — Discharge Instructions (Signed)
Follow-up with your gastroenterologist in the next week, and return to the ER if you develop worsening pain, fever, or other new and concerning symptoms.

## 2017-10-14 ENCOUNTER — Other Ambulatory Visit: Payer: Self-pay | Admitting: Internal Medicine

## 2017-10-14 ENCOUNTER — Other Ambulatory Visit: Payer: Self-pay | Admitting: Family Medicine

## 2017-10-14 NOTE — Telephone Encounter (Signed)
[facility] come [facility]

## 2017-10-16 ENCOUNTER — Other Ambulatory Visit: Payer: Self-pay | Admitting: Family Medicine

## 2017-10-19 ENCOUNTER — Telehealth: Payer: Self-pay

## 2017-10-19 ENCOUNTER — Other Ambulatory Visit: Payer: Self-pay | Admitting: Family Medicine

## 2017-10-19 NOTE — Telephone Encounter (Signed)
I will address this at the office visit on Wednesday. I have not seen this patient.

## 2017-10-19 NOTE — Telephone Encounter (Signed)
Called and spoke with patient. She is asking for a refill on her lasix and spirolactone. These were not prescribed by our office. Please advise if this can be refilled. She has an appointment with you on 10/21/2017. Thanks!

## 2017-10-19 NOTE — Telephone Encounter (Signed)
Called and spoke with patient advised that this will be addressed during Wednesday appointment. Patient verbalized understanding. Thanks!

## 2017-10-20 ENCOUNTER — Other Ambulatory Visit: Payer: Self-pay

## 2017-10-20 ENCOUNTER — Encounter (HOSPITAL_COMMUNITY): Payer: Self-pay | Admitting: *Deleted

## 2017-10-20 ENCOUNTER — Emergency Department (HOSPITAL_COMMUNITY)
Admission: EM | Admit: 2017-10-20 | Discharge: 2017-10-21 | Disposition: A | Discharge status: Home / Self Care | Payer: Medicaid Other | Attending: Emergency Medicine | Admitting: Emergency Medicine

## 2017-10-20 DIAGNOSIS — Z79899 Other long term (current) drug therapy: Secondary | ICD-10-CM | POA: Diagnosis not present

## 2017-10-20 DIAGNOSIS — F17210 Nicotine dependence, cigarettes, uncomplicated: Secondary | ICD-10-CM | POA: Insufficient documentation

## 2017-10-20 DIAGNOSIS — R188 Other ascites: Secondary | ICD-10-CM | POA: Insufficient documentation

## 2017-10-20 DIAGNOSIS — I10 Essential (primary) hypertension: Secondary | ICD-10-CM | POA: Diagnosis not present

## 2017-10-20 DIAGNOSIS — R1011 Right upper quadrant pain: Secondary | ICD-10-CM | POA: Diagnosis present

## 2017-10-20 LAB — COMPREHENSIVE METABOLIC PANEL
ALBUMIN: 1.9 g/dL — AB (ref 3.5–5.0)
ALT: 21 U/L (ref 0–44)
AST: 52 U/L — AB (ref 15–41)
Alkaline Phosphatase: 147 U/L — ABNORMAL HIGH (ref 38–126)
Anion gap: 4 — ABNORMAL LOW (ref 5–15)
BUN: 5 mg/dL — AB (ref 6–20)
CALCIUM: 8 mg/dL — AB (ref 8.9–10.3)
CHLORIDE: 108 mmol/L (ref 98–111)
CO2: 24 mmol/L (ref 22–32)
CREATININE: 0.97 mg/dL (ref 0.44–1.00)
GFR calc Af Amer: >60 mL/min (ref >60)
GFR calc non Af Amer: >60 mL/min (ref >60)
GLUCOSE: 123 mg/dL — AB (ref 70–99)
POTASSIUM: 3.3 mmol/L — AB (ref 3.5–5.1)
SODIUM: 136 mmol/L (ref 135–145)
Total Bilirubin: 2.1 mg/dL — ABNORMAL HIGH (ref 0.3–1.2)
Total Protein: 7.4 g/dL (ref 6.5–8.1)

## 2017-10-20 LAB — CBC
HCT: 41.7 % (ref 36.0–46.0)
Hemoglobin: 13.5 g/dL (ref 12.0–15.0)
MCH: 35.2 pg — AB (ref 26.0–34.0)
MCHC: 32.4 g/dL (ref 30.0–36.0)
MCV: 108.6 fL — AB (ref 80.0–100.0)
NRBC: 0 % (ref 0.0–0.2)
PLATELETS: 160 10*3/uL (ref 150–400)
RBC: 3.84 MIL/uL — AB (ref 3.87–5.11)
RDW: 13.8 % (ref 11.5–15.5)
WBC: 4.7 10*3/uL (ref 4.0–10.5)

## 2017-10-20 LAB — LIPASE, BLOOD: Lipase: 27 U/L (ref 11–51)

## 2017-10-20 LAB — I-STAT BETA HCG BLOOD, ED (MC, WL, AP ONLY): I-stat hCG, quantitative: <5 m[IU]/mL (ref <5)

## 2017-10-20 MED ORDER — FENTANYL CITRATE (PF) 100 MCG/2ML IJ SOLN
50.0000 ug | INTRAMUSCULAR | Status: DC | PRN
  Administered 2017-10-20: 50 ug via INTRAVENOUS
  Filled 2017-10-20: qty 2

## 2017-10-20 NOTE — ED Notes (Addendum)
Ultrasound machine at bedside

## 2017-10-20 NOTE — ED Notes (Addendum)
Pt complains her belly feels full. Pt reports they took her off her water pills. Pt also complains of hernia pain.
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
## 2017-10-20 NOTE — ED Triage Notes (Addendum)
Pt c/o lower abd pain for the past 2 days. Hx of hernia x 4 years, unable to do surgery due to weight. Pt appears very uncomfortable. Also reports being taking off her diuretics a month ago

## 2017-10-20 NOTE — Discharge Instructions (Addendum)
We drained 5L of fluid off of your abdomen. There is no evidence of infection in the fluid. Please follow up with your primary care doctor about restarting furosemide or spironolactone. In the future, if you need more fluid taken off, please call interventional radiology to schedule an appointment with them to get more fluid off.

## 2017-10-21 ENCOUNTER — Other Ambulatory Visit: Payer: Self-pay

## 2017-10-21 ENCOUNTER — Encounter: Payer: Self-pay | Admitting: Family Medicine

## 2017-10-21 ENCOUNTER — Ambulatory Visit (INDEPENDENT_AMBULATORY_CARE_PROVIDER_SITE_OTHER): Payer: Medicaid Other | Admitting: Family Medicine

## 2017-10-21 VITALS — BP 122/80 | HR 79 | Temp 98.9°F | Resp 18 | Ht 60.0 in | Wt 201.2 lb

## 2017-10-21 DIAGNOSIS — I10 Essential (primary) hypertension: Secondary | ICD-10-CM

## 2017-10-21 DIAGNOSIS — K7031 Alcoholic cirrhosis of liver with ascites: Secondary | ICD-10-CM

## 2017-10-21 LAB — GRAM STAIN

## 2017-10-21 LAB — BODY FLUID CELL COUNT WITH DIFFERENTIAL
Eos, Fluid: 0 %
LYMPHS FL: 39 %
Monocyte-Macrophage-Serous Fluid: 53 % (ref 50–90)
Neutrophil Count, Fluid: 8 % (ref 0–25)
Total Nucleated Cell Count, Fluid: 95 cu mm (ref 0–1000)

## 2017-10-21 LAB — ALBUMIN, PLEURAL OR PERITONEAL FLUID: Albumin, Fluid: <1 g/dL

## 2017-10-21 LAB — PROTEIN, PLEURAL OR PERITONEAL FLUID

## 2017-10-21 LAB — LACTATE DEHYDROGENASE, PLEURAL OR PERITONEAL FLUID: LD FL: 46 U/L — AB (ref 3–23)

## 2017-10-21 LAB — GLUCOSE, PLEURAL OR PERITONEAL FLUID: Glucose, Fluid: 119 mg/dL

## 2017-10-21 MED ORDER — FOLIC ACID 1 MG PO TABS: 1.0000 mg | ORAL_TABLET | Freq: Every day | ORAL | 3 refills | Status: AC

## 2017-10-21 MED ORDER — VITAMIN B-1 50 MG PO TABS: 50.0000 mg | ORAL_TABLET | Freq: Every day | ORAL | 3 refills | Status: DC

## 2017-10-21 MED ORDER — FUROSEMIDE 40 MG PO TABS: 40.0000 mg | ORAL_TABLET | Freq: Every day | ORAL | 0 refills | Status: DC

## 2017-10-21 MED ORDER — SPIRONOLACTONE 25 MG PO TABS: 25.0000 mg | ORAL_TABLET | Freq: Every day | ORAL | 0 refills | Status: DC

## 2017-10-21 NOTE — Patient Instructions (Signed)
I sent the furosemide and spironolactone to the pharmacy. Both of these are fluid pills to help with the fluid on your stomach. I need to check your potassium in the next 2 weeks.     Ascites Ascites is a collection of excess fluid in the abdomen. Ascites can range from mild to severe. It can get worse without treatment. What are the causes? Possible causes include:  Cirrhosis. This is the most common cause of ascites.  Infection or inflammation in the abdomen.  Cancer in the abdomen.  Heart failure.  Kidney disease.  Inflammation of the pancreas.  Clots in the veins of the liver.  What are the signs or symptoms? Signs and symptoms may include:  A feeling of fullness in your abdomen. This is common.  An increase in the size of your abdomen or your waist.  Swelling in your legs.  Swelling of the scrotum in men.  Difficulty breathing.  Abdominal pain.  Sudden weight gain.  If the condition is mild, you may not have symptoms. How is this diagnosed? To make a diagnosis, your health care provider will:  Ask about your medical history.  Perform a physical exam.  Order imaging tests, such as an ultrasound or CT scan of your abdomen.  How is this treated? Treatment depends on the cause of the ascites. It may include:  Taking a pill to make you urinate. This is called a water pill (diuretic pill).  Strictly reducing your salt (sodium) intake. Salt can cause extra fluid to be kept in the body, and this makes ascites worse.  Having a procedure to remove fluid from your abdomen (paracentesis).  Having a procedure to transfer fluid from your abdomen into a vein.  Having a procedure that connects two of the major veins within your liver and relieves pressure on your liver (TIPS procedure).  Ascites may go away or improve with treatment of the condition that caused it. Follow these instructions at home:  Keep track of your weight. To do this, weigh yourself at the  same time every day and record your weight.  Keep track of how much you drink and any changes in the amount you urinate.  Follow any instructions that your health care provider gives you about how much to drink.  Try not to eat salty (high-sodium) foods.  Take medicines only as directed by your health care provider.  Keep all follow-up visits as directed by your health care provider. This is important.  Report any changes in your health to your health care provider, especially if you develop new symptoms or your symptoms get worse. Contact a health care provider if:  Your gain more than 3 pounds in 3 days.  Your abdominal size or your waist size increases.  You have new swelling in your legs.  The swelling in your legs gets worse. Get help right away if:  You develop a fever.  You develop confusion.  You develop new or worsening difficulty breathing.  You develop new or worsening abdominal pain.  You develop new or worsening swelling in the scrotum (in men). This information is not intended to replace advice given to you by your health care provider. Make sure you discuss any questions you have with your health care provider. Document Released: 12/23/2004 Document Revised: 05/02/2015 Document Reviewed: 07/22/2013 Elsevier Interactive Patient Education  [employer].

## 2017-10-21 NOTE — ED Notes (Signed)
Patient verbalizes understanding of discharge instructions. Opportunity for questioning and answers were provided. Armband removed by staff, pt discharged from ED home via POV.  

## 2017-10-21 NOTE — Progress Notes (Signed)
  [facility]   Progress Note: General Provider: Mike Gip, FNP  SUBJECTIVE:   Linda Vaughn is a 52 y.o. female who  has a past medical history of Arthritis, Ascites, Cirrhosis of liver (HCC), Gallstones, Hepatitis C, Hypertension, Macrocytic anemia, Recurrent right pleural effusion, SBP (spontaneous bacterial peritonitis) (HCC) (11/23/2016), Thrombocytopenia (HCC), and Umbilical hernia.. Patient presents today for Follow-up (needs to get lasix back - frequent pericentisis being performed ) Patient presents today for an emergency department follow-up.  She has presented to the emergency department 2 times in the past few weeks due to ascites.  She states that she has a history of being on diuretics in the past to prevent ascites.  Patient is requesting that she be restarted on medication. Review of Systems  Constitutional: Negative.   HENT: Negative.   Eyes: Negative.   Respiratory: Negative.   Cardiovascular: Negative.   Gastrointestinal:       Large abdominal hernia  Genitourinary: Negative.   Musculoskeletal: Negative.   Skin: Negative.   Neurological: Negative.   Psychiatric/Behavioral: Negative.      OBJECTIVE: Ht 5' (1.524 m)   Wt 201 lb 3.2 oz (91.3 kg)   BMI 39.29 kg/m   Physical Exam  Constitutional: She appears well-developed and well-nourished. No distress.  HENT:  Head: Normocephalic and atraumatic.  Cardiovascular: Normal rate, regular rhythm and normal heart sounds.  No murmur heard. Pulmonary/Chest: Effort normal and breath sounds normal. No respiratory distress.  Abdominal: A hernia (Large abdominal hernia.) is present.    ASSESSMENT/PLAN:  1. Essential hypertension Continue with current medications.  Will restart Lasix. - furosemide (LASIX) 40 MG tablet; Take 1 tablet (40 mg total) by mouth daily.  Dispense: 90 tablet; Refill: 0  2. Alcoholic cirrhosis of liver with ascites (HCC) We will start  patient on Lasix and Aldactone concurrently.  Patient advised to return to the clinic in 2 weeks for potassium levels.  Also read start thiamine and folic acid.  Patient advised if swelling in her abdomen shortness of breath or difficulty breathing occur  to return to the emergency department. - furosemide (LASIX) 40 MG tablet; Take 1 tablet (40 mg total) by mouth daily.  Dispense: 90 tablet; Refill: 0 - spironolactone (ALDACTONE) 25 MG tablet; Take 1 tablet (25 mg total) by mouth daily.  Dispense: 90 tablet; Refill: 0 - thiamine (VITAMIN B-1) 50 MG tablet; Take 1 tablet (50 mg total) by mouth daily.  Dispense: 90 tablet; Refill: 3 - folic acid (FOLVITE) 1 MG tablet; Take 1 tablet (1 mg total) by mouth daily.  Dispense: 90 tablet; Refill: 3  Patient is followed by Dr. Carlynn Purl at [facility]. She has been advised to obtain a colonoscopy, lose weight and to stop smoking. She has been referred and once completed she will be a candidate for surgery. She has been instructed to continue wearing the abdominal binder in a larger size.      The patient was given clear instructions to go to ER or return to medical center if symptoms do not improve, worsen or new problems develop. The patient verbalized understanding and agreed with plan of care.   [name]. Linda Lam, FNP-BC [facility] [address] [place], [place] [address] [phone]     This note has been created with Dragon speech recognition software and smart phrase technology. Any transcriptional errors are unintentional.

## 2017-10-22 LAB — PATHOLOGIST SMEAR REVIEW

## 2017-10-25 LAB — CULTURE, BODY FLUID-BOTTLE: CULTURE: NO GROWTH

## 2017-10-25 LAB — CULTURE, BODY FLUID W GRAM STAIN -BOTTLE

## 2017-10-28 ENCOUNTER — Other Ambulatory Visit: Payer: Self-pay

## 2017-10-28 ENCOUNTER — Emergency Department (HOSPITAL_COMMUNITY)
Admission: EM | Admit: 2017-10-28 | Discharge: 2017-10-28 | Discharge status: Against Medical Advice | Payer: Medicaid Other | Attending: Emergency Medicine | Admitting: Emergency Medicine

## 2017-10-28 DIAGNOSIS — Z5321 Procedure and treatment not carried out due to patient leaving prior to being seen by health care provider: Secondary | ICD-10-CM | POA: Diagnosis not present

## 2017-10-28 DIAGNOSIS — I10 Essential (primary) hypertension: Secondary | ICD-10-CM

## 2017-10-28 DIAGNOSIS — R109 Unspecified abdominal pain: Secondary | ICD-10-CM | POA: Diagnosis present

## 2017-10-28 DIAGNOSIS — K7031 Alcoholic cirrhosis of liver with ascites: Secondary | ICD-10-CM

## 2017-10-28 LAB — I-STAT BETA HCG BLOOD, ED (MC, WL, AP ONLY): I-stat hCG, quantitative: <5 m[IU]/mL (ref <5)

## 2017-10-28 LAB — COMPREHENSIVE METABOLIC PANEL
ALBUMIN: 1.8 g/dL — AB (ref 3.5–5.0)
ALK PHOS: 123 U/L (ref 38–126)
ALT: 19 U/L (ref 0–44)
ANION GAP: 4 — AB (ref 5–15)
AST: 45 U/L — ABNORMAL HIGH (ref 15–41)
BILIRUBIN TOTAL: 2.4 mg/dL — AB (ref 0.3–1.2)
BUN: 6 mg/dL (ref 6–20)
CALCIUM: 7.8 mg/dL — AB (ref 8.9–10.3)
CO2: 21 mmol/L — AB (ref 22–32)
Chloride: 109 mmol/L (ref 98–111)
Creatinine, Ser: 0.86 mg/dL (ref 0.44–1.00)
GFR calc non Af Amer: >60 mL/min (ref >60)
Glucose, Bld: 158 mg/dL — ABNORMAL HIGH (ref 70–99)
Potassium: 3.4 mmol/L — ABNORMAL LOW (ref 3.5–5.1)
SODIUM: 134 mmol/L — AB (ref 135–145)
TOTAL PROTEIN: 7.1 g/dL (ref 6.5–8.1)

## 2017-10-28 LAB — LIPASE, BLOOD: Lipase: 27 U/L (ref 11–51)

## 2017-10-28 LAB — CBC
HCT: 42.7 % (ref 36.0–46.0)
HEMOGLOBIN: 13.9 g/dL (ref 12.0–15.0)
MCH: 35.2 pg — ABNORMAL HIGH (ref 26.0–34.0)
MCHC: 32.6 g/dL (ref 30.0–36.0)
MCV: 108.1 fL — ABNORMAL HIGH (ref 80.0–100.0)
NRBC: 0 % (ref 0.0–0.2)
PLATELETS: 155 10*3/uL (ref 150–400)
RBC: 3.95 MIL/uL (ref 3.87–5.11)
RDW: 13.8 % (ref 11.5–15.5)
WBC: 4.7 10*3/uL (ref 4.0–10.5)

## 2017-10-28 MED ORDER — FUROSEMIDE 40 MG PO TABS: 40.0000 mg | ORAL_TABLET | Freq: Every day | ORAL | 0 refills | Status: DC
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
## 2017-10-28 NOTE — ED Notes (Signed)
Pt states that she is leaving, informed pt that she has been placed in a room, pt states she is leaving anyway

## 2017-10-29 ENCOUNTER — Encounter (HOSPITAL_COMMUNITY): Payer: Self-pay | Admitting: *Deleted

## 2017-10-29 ENCOUNTER — Emergency Department (HOSPITAL_COMMUNITY)
Admission: EM | Admit: 2017-10-29 | Discharge: 2017-10-29 | Disposition: A | Discharge status: Home / Self Care | Payer: Medicaid Other | Attending: Emergency Medicine | Admitting: Emergency Medicine

## 2017-10-29 ENCOUNTER — Emergency Department (HOSPITAL_COMMUNITY): Payer: Medicaid Other

## 2017-10-29 DIAGNOSIS — K7460 Unspecified cirrhosis of liver: Secondary | ICD-10-CM | POA: Insufficient documentation

## 2017-10-29 DIAGNOSIS — E876 Hypokalemia: Secondary | ICD-10-CM | POA: Diagnosis not present

## 2017-10-29 DIAGNOSIS — R188 Other ascites: Secondary | ICD-10-CM | POA: Diagnosis not present

## 2017-10-29 DIAGNOSIS — F17210 Nicotine dependence, cigarettes, uncomplicated: Secondary | ICD-10-CM | POA: Insufficient documentation

## 2017-10-29 DIAGNOSIS — I10 Essential (primary) hypertension: Secondary | ICD-10-CM | POA: Insufficient documentation

## 2017-10-29 DIAGNOSIS — R109 Unspecified abdominal pain: Secondary | ICD-10-CM | POA: Diagnosis present

## 2017-10-29 DIAGNOSIS — B1710 Acute hepatitis C without hepatic coma: Secondary | ICD-10-CM | POA: Diagnosis not present

## 2017-10-29 LAB — COMPREHENSIVE METABOLIC PANEL
ALK PHOS: 128 U/L — AB (ref 38–126)
ALT: 18 U/L (ref 0–44)
AST: 45 U/L — ABNORMAL HIGH (ref 15–41)
Albumin: 2.2 g/dL — ABNORMAL LOW (ref 3.5–5.0)
Anion gap: 7 (ref 5–15)
BILIRUBIN TOTAL: 3.2 mg/dL — AB (ref 0.3–1.2)
BUN: 7 mg/dL (ref 6–20)
CALCIUM: 7.8 mg/dL — AB (ref 8.9–10.3)
CO2: 24 mmol/L (ref 22–32)
Chloride: 106 mmol/L (ref 98–111)
Creatinine, Ser: 0.77 mg/dL (ref 0.44–1.00)
GLUCOSE: 135 mg/dL — AB (ref 70–99)
Potassium: 2.8 mmol/L — ABNORMAL LOW (ref 3.5–5.1)
Sodium: 137 mmol/L (ref 135–145)
TOTAL PROTEIN: 7.9 g/dL (ref 6.5–8.1)

## 2017-10-29 LAB — CBC
HCT: 42.8 % (ref 36.0–46.0)
Hemoglobin: 14.5 g/dL (ref 12.0–15.0)
MCH: 36.6 pg — AB (ref 26.0–34.0)
MCHC: 33.9 g/dL (ref 30.0–36.0)
MCV: 108.1 fL — AB (ref 80.0–100.0)
PLATELETS: 154 10*3/uL (ref 150–400)
RBC: 3.96 MIL/uL (ref 3.87–5.11)
RDW: 13.7 % (ref 11.5–15.5)
WBC: 4.5 10*3/uL (ref 4.0–10.5)
nRBC: 0 % (ref 0.0–0.2)

## 2017-10-29 LAB — I-STAT BETA HCG BLOOD, ED (MC, WL, AP ONLY): I-stat hCG, quantitative: <5 m[IU]/mL (ref <5)

## 2017-10-29 LAB — LIPASE, BLOOD: Lipase: 24 U/L (ref 11–51)

## 2017-10-29 MED ORDER — MORPHINE SULFATE (PF) 4 MG/ML IV SOLN
4.0000 mg | Freq: Once | INTRAVENOUS | Status: AC
  Administered 2017-10-29: 4 mg via INTRAVENOUS
  Filled 2017-10-29: qty 1

## 2017-10-29 MED ORDER — LIDOCAINE HCL 1 % IJ SOLN
INTRAMUSCULAR | Status: AC
  Filled 2017-10-29: qty 10

## 2017-10-29 MED ORDER — POTASSIUM CHLORIDE CRYS ER 20 MEQ PO TBCR
40.0000 meq | EXTENDED_RELEASE_TABLET | Freq: Once | ORAL | Status: AC
  Administered 2017-10-29: 40 meq via ORAL
  Filled 2017-10-29: qty 2

## 2017-10-29 MED ORDER — SODIUM CHLORIDE 0.9 % IV BOLUS
500.0000 mL | Freq: Once | INTRAVENOUS | Status: AC
  Administered 2017-10-29: 500 mL via INTRAVENOUS

## 2017-10-29 NOTE — Discharge Instructions (Signed)
Your evaluated in the emergency department for increased abdominal pain and ascites.  You had fluid drained off your abdomen with improvement in your symptoms.  Your potassium was also mildly low and we gave you a supplement.  Will be important for you to contact your primary care doctor tomorrow to make sure that you are on all your medications that you need to be on.  Please return if any worsening symptoms.
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
## 2017-10-29 NOTE — Procedures (Signed)
PROCEDURE SUMMARY:  Successful image-guided paracentesis from the right lower abdomen.  Yielded 7 liters of clear yellow fluid.  No immediate complications.  Patient tolerated well.   Specimen was sent for labs.  Elwin Mocha PA-C 10/29/2017 4:39 PM

## 2017-10-30 LAB — BODY FLUID CULTURE

## 2017-10-30 LAB — GRAM STAIN

## 2017-11-03 LAB — CULTURE, BODY FLUID W GRAM STAIN -BOTTLE

## 2017-11-03 LAB — CULTURE, BODY FLUID-BOTTLE: CULTURE: NO GROWTH

## 2017-11-09 ENCOUNTER — Emergency Department (HOSPITAL_COMMUNITY)
Admission: EM | Admit: 2017-11-09 | Discharge: 2017-11-10 | Disposition: A | Discharge status: Home / Self Care | Payer: Medicaid Other | Attending: Emergency Medicine | Admitting: Emergency Medicine

## 2017-11-09 ENCOUNTER — Encounter (HOSPITAL_COMMUNITY): Payer: Self-pay | Admitting: Emergency Medicine

## 2017-11-09 DIAGNOSIS — Z5321 Procedure and treatment not carried out due to patient leaving prior to being seen by health care provider: Secondary | ICD-10-CM | POA: Insufficient documentation

## 2017-11-09 DIAGNOSIS — R109 Unspecified abdominal pain: Secondary | ICD-10-CM | POA: Insufficient documentation

## 2017-11-09 LAB — COMPREHENSIVE METABOLIC PANEL
ALK PHOS: 161 U/L — AB (ref 38–126)
ALT: 18 U/L (ref 0–44)
ANION GAP: 3 — AB (ref 5–15)
AST: 45 U/L — ABNORMAL HIGH (ref 15–41)
Albumin: 1.7 g/dL — ABNORMAL LOW (ref 3.5–5.0)
BILIRUBIN TOTAL: 1.3 mg/dL — AB (ref 0.3–1.2)
BUN: 5 mg/dL — ABNORMAL LOW (ref 6–20)
CALCIUM: 8.1 mg/dL — AB (ref 8.9–10.3)
CO2: 27 mmol/L (ref 22–32)
Chloride: 106 mmol/L (ref 98–111)
Creatinine, Ser: 0.89 mg/dL (ref 0.44–1.00)
GLUCOSE: 123 mg/dL — AB (ref 70–99)
Potassium: 3.1 mmol/L — ABNORMAL LOW (ref 3.5–5.1)
Sodium: 136 mmol/L (ref 135–145)
TOTAL PROTEIN: 7.3 g/dL (ref 6.5–8.1)

## 2017-11-09 LAB — CBC
HCT: 41 % (ref 36.0–46.0)
Hemoglobin: 13.3 g/dL (ref 12.0–15.0)
MCH: 35.2 pg — AB (ref 26.0–34.0)
MCHC: 32.4 g/dL (ref 30.0–36.0)
MCV: 108.5 fL — ABNORMAL HIGH (ref 80.0–100.0)
PLATELETS: 144 10*3/uL — AB (ref 150–400)
RBC: 3.78 MIL/uL — AB (ref 3.87–5.11)
RDW: 13.6 % (ref 11.5–15.5)
WBC: 4.2 10*3/uL (ref 4.0–10.5)
nRBC: 0 % (ref 0.0–0.2)

## 2017-11-09 LAB — LIPASE, BLOOD: Lipase: 28 U/L (ref 11–51)

## 2017-11-09 MED ORDER — HYDROMORPHONE HCL 1 MG/ML IJ SOLN: 1.0000 mg | Freq: Once | INTRAMUSCULAR | Status: DC

## 2017-11-09 MED ORDER — ONDANSETRON HCL 4 MG/2ML IJ SOLN: 4.0000 mg | Freq: Once | INTRAMUSCULAR | Status: DC

## 2017-11-09 NOTE — ED Triage Notes (Signed)
Patient to ED from home via [facility] c/o recurrent N/V and abdominal pain and swelling r/t cirrhosis of liver/ascites. Patient has recently been for same and she reports paracentesis last month. Denies shortness of breath or chest pain, lung sounds clear. Denies fevers/chills. EMS VS: 128/98, HR 77 NSR, 98% RA, CBG 125.

## 2017-11-09 NOTE — ED Provider Notes (Cosign Needed)
Patient placed in Quick Look pathway, seen and evaluated   Chief Complaint: abdominal pain  HPI: Linda Vaughn is a 52 y.o. female with hx of peritonitis, ascites, cirrhosis of the liver, Hep. C, HTN, anemia, recurrent right pleural effusion and unbiblical hernia who presents to the ED with abdominal pain. Patient arrived via [facility]. She reports n/v and severe pain and abdominal distention. Patient recently evaluated for similar symptoms and had paracentesis last month. Patient denies shortness of breath or chest pain.   ROS: GI: abdominal pain  Physical Exam:  BP 111/89 (BP Location: Right Arm)   Pulse 79   Temp 98.5 F (36.9 C) (Oral)   Resp 18   Ht 5' (1.524 m)   Wt 91.2 kg   SpO2 99%   BMI 39.26 kg/m    Gen: No distress  Neuro: Awake and Alert  Skin: Warm and dry  Heart regular rate and rhythm  Lungs: without wheezing or rales.    Initiation of care has begun. The patient has been counseled on the process, plan, and necessity for staying for the completion/evaluation, and the remainder of the medical screening examination    Janne Napoleon, NP 11/09/17 1812

## 2017-11-10 ENCOUNTER — Emergency Department (HOSPITAL_COMMUNITY): Payer: Medicaid Other

## 2017-11-10 ENCOUNTER — Emergency Department (HOSPITAL_COMMUNITY)
Admission: EM | Admit: 2017-11-10 | Discharge: 2017-11-10 | Disposition: A | Discharge status: Home / Self Care | Payer: Medicaid Other | Source: Home / Self Care | Attending: Emergency Medicine | Admitting: Emergency Medicine

## 2017-11-10 ENCOUNTER — Encounter (HOSPITAL_COMMUNITY): Payer: Self-pay

## 2017-11-10 DIAGNOSIS — I10 Essential (primary) hypertension: Secondary | ICD-10-CM | POA: Insufficient documentation

## 2017-11-10 DIAGNOSIS — F17200 Nicotine dependence, unspecified, uncomplicated: Secondary | ICD-10-CM | POA: Insufficient documentation

## 2017-11-10 DIAGNOSIS — Z79899 Other long term (current) drug therapy: Secondary | ICD-10-CM | POA: Insufficient documentation

## 2017-11-10 DIAGNOSIS — F329 Major depressive disorder, single episode, unspecified: Secondary | ICD-10-CM

## 2017-11-10 DIAGNOSIS — Z9049 Acquired absence of other specified parts of digestive tract: Secondary | ICD-10-CM | POA: Insufficient documentation

## 2017-11-10 DIAGNOSIS — R188 Other ascites: Secondary | ICD-10-CM | POA: Insufficient documentation

## 2017-11-10 DIAGNOSIS — R109 Unspecified abdominal pain: Secondary | ICD-10-CM

## 2017-11-10 LAB — COMPREHENSIVE METABOLIC PANEL
ALK PHOS: 125 U/L (ref 38–126)
ALT: 19 U/L (ref 0–44)
AST: 43 U/L — ABNORMAL HIGH (ref 15–41)
Albumin: 1.8 g/dL — ABNORMAL LOW (ref 3.5–5.0)
Anion gap: 6 (ref 5–15)
BILIRUBIN TOTAL: 2 mg/dL — AB (ref 0.3–1.2)
BUN: 8 mg/dL (ref 6–20)
CALCIUM: 7.9 mg/dL — AB (ref 8.9–10.3)
CO2: 26 mmol/L (ref 22–32)
CREATININE: 0.75 mg/dL (ref 0.44–1.00)
Chloride: 104 mmol/L (ref 98–111)
Glucose, Bld: 143 mg/dL — ABNORMAL HIGH (ref 70–99)
Potassium: 3.1 mmol/L — ABNORMAL LOW (ref 3.5–5.1)
Sodium: 136 mmol/L (ref 135–145)
TOTAL PROTEIN: 6.8 g/dL (ref 6.5–8.1)

## 2017-11-10 LAB — CBC
HEMATOCRIT: 39.5 % (ref 36.0–46.0)
Hemoglobin: 13.2 g/dL (ref 12.0–15.0)
MCH: 36 pg — AB (ref 26.0–34.0)
MCHC: 33.4 g/dL (ref 30.0–36.0)
MCV: 107.6 fL — AB (ref 80.0–100.0)
NRBC: 0 % (ref 0.0–0.2)
Platelets: 138 10*3/uL — ABNORMAL LOW (ref 150–400)
RBC: 3.67 MIL/uL — AB (ref 3.87–5.11)
RDW: 13.5 % (ref 11.5–15.5)
WBC: 3.7 10*3/uL — ABNORMAL LOW (ref 4.0–10.5)

## 2017-11-10 LAB — LIPASE, BLOOD: Lipase: 25 U/L (ref 11–51)

## 2017-11-10 MED ORDER — TRAMADOL HCL 50 MG PO TABS
50.0000 mg | ORAL_TABLET | Freq: Once | ORAL | Status: AC
  Administered 2017-11-10: 50 mg via ORAL
  Filled 2017-11-10: qty 1

## 2017-11-10 MED ORDER — LIDOCAINE HCL 1 % IJ SOLN
INTRAMUSCULAR | Status: AC
  Filled 2017-11-10: qty 10

## 2017-11-10 MED ORDER — TRAMADOL HCL 50 MG PO TABS
100.0000 mg | ORAL_TABLET | Freq: Once | ORAL | Status: AC
  Administered 2017-11-10: 100 mg via ORAL
  Filled 2017-11-10: qty 2

## 2017-11-10 NOTE — Procedures (Signed)
PROCEDURE SUMMARY:  Successful US guided paracentesis from RLQ.  Yielded 6.7 L of clear yellow fluid.  No immediate complications.  Pt tolerated well.   Specimen was sent for labs.  Brayton El PA-C 11/10/2017 1:03 PM

## 2017-11-10 NOTE — ED Notes (Signed)
Patient transported to Ultrasound paracentesis.

## 2017-11-10 NOTE — ED Notes (Signed)
Bed: WA09 Expected date:  Expected time:  Means of arrival:  Comments: 52 yo f. Hernia

## 2017-11-10 NOTE — ED Notes (Signed)
Called X1 for room no answer

## 2017-11-10 NOTE — ED Triage Notes (Signed)
Patient arrived via [facility] from home. Patient has had an umbilical hernia X4 years. Fluid build up on abdomen. Distended. Tender to touch. 10/10 pain. Vomiting x3 days after eating. Not able to eat or keep in fluids or food down.  No n/v with EMS in route.  Patient tried to go to [facility] yesterday but wait was too long.

## 2017-11-10 NOTE — ED Notes (Signed)
Ed provider at bedside
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
## 2017-11-16 ENCOUNTER — Telehealth: Payer: Self-pay

## 2017-11-16 LAB — BODY FLUID CULTURE
Culture: NO GROWTH
Gram Stain: NONE SEEN

## 2017-11-16 NOTE — Telephone Encounter (Signed)
Called and spoke with patient. She states she needs a paracentesis. I told her she would need to contact her gasto doctor for this or go to the ER. Patient verbalized understanding. Thanks!

## 2017-11-18 ENCOUNTER — Encounter (HOSPITAL_COMMUNITY): Payer: Self-pay

## 2017-11-18 ENCOUNTER — Other Ambulatory Visit: Payer: Self-pay

## 2017-11-18 ENCOUNTER — Observation Stay (HOSPITAL_COMMUNITY)
Admission: EM | Admit: 2017-11-18 | Discharge: 2017-11-19 | Disposition: A | Discharge status: Home / Self Care | Payer: Medicaid Other | Attending: Internal Medicine | Admitting: Internal Medicine

## 2017-11-18 DIAGNOSIS — K7460 Unspecified cirrhosis of liver: Secondary | ICD-10-CM | POA: Diagnosis present

## 2017-11-18 DIAGNOSIS — E46 Unspecified protein-calorie malnutrition: Secondary | ICD-10-CM | POA: Diagnosis not present

## 2017-11-18 DIAGNOSIS — N179 Acute kidney failure, unspecified: Secondary | ICD-10-CM | POA: Insufficient documentation

## 2017-11-18 DIAGNOSIS — K7031 Alcoholic cirrhosis of liver with ascites: Principal | ICD-10-CM | POA: Insufficient documentation

## 2017-11-18 DIAGNOSIS — I10 Essential (primary) hypertension: Secondary | ICD-10-CM

## 2017-11-18 DIAGNOSIS — I700 Atherosclerosis of aorta: Secondary | ICD-10-CM | POA: Insufficient documentation

## 2017-11-18 DIAGNOSIS — K429 Umbilical hernia without obstruction or gangrene: Secondary | ICD-10-CM | POA: Diagnosis not present

## 2017-11-18 DIAGNOSIS — B182 Chronic viral hepatitis C: Secondary | ICD-10-CM | POA: Insufficient documentation

## 2017-11-18 DIAGNOSIS — E871 Hypo-osmolality and hyponatremia: Secondary | ICD-10-CM | POA: Insufficient documentation

## 2017-11-18 DIAGNOSIS — R188 Other ascites: Secondary | ICD-10-CM | POA: Diagnosis present

## 2017-11-18 DIAGNOSIS — Z886 Allergy status to analgesic agent status: Secondary | ICD-10-CM | POA: Diagnosis not present

## 2017-11-18 DIAGNOSIS — Z79899 Other long term (current) drug therapy: Secondary | ICD-10-CM | POA: Insufficient documentation

## 2017-11-18 DIAGNOSIS — F17210 Nicotine dependence, cigarettes, uncomplicated: Secondary | ICD-10-CM | POA: Diagnosis not present

## 2017-11-18 DIAGNOSIS — F329 Major depressive disorder, single episode, unspecified: Secondary | ICD-10-CM | POA: Diagnosis not present

## 2017-11-18 DIAGNOSIS — D696 Thrombocytopenia, unspecified: Secondary | ICD-10-CM | POA: Insufficient documentation

## 2017-11-18 DIAGNOSIS — N390 Urinary tract infection, site not specified: Secondary | ICD-10-CM | POA: Diagnosis not present

## 2017-11-18 DIAGNOSIS — F1410 Cocaine abuse, uncomplicated: Secondary | ICD-10-CM | POA: Insufficient documentation

## 2017-11-18 DIAGNOSIS — F1210 Cannabis abuse, uncomplicated: Secondary | ICD-10-CM | POA: Diagnosis not present

## 2017-11-18 DIAGNOSIS — F1490 Cocaine use, unspecified, uncomplicated: Secondary | ICD-10-CM | POA: Diagnosis present

## 2017-11-18 DIAGNOSIS — R140 Abdominal distension (gaseous): Secondary | ICD-10-CM | POA: Diagnosis present

## 2017-11-18 DIAGNOSIS — K7011 Alcoholic hepatitis with ascites: Secondary | ICD-10-CM | POA: Diagnosis not present

## 2017-11-18 DIAGNOSIS — F1010 Alcohol abuse, uncomplicated: Secondary | ICD-10-CM | POA: Diagnosis present

## 2017-11-18 DIAGNOSIS — K7290 Hepatic failure, unspecified without coma: Secondary | ICD-10-CM | POA: Diagnosis present

## 2017-11-18 DIAGNOSIS — J90 Pleural effusion, not elsewhere classified: Secondary | ICD-10-CM | POA: Diagnosis not present

## 2017-11-18 DIAGNOSIS — F1290 Cannabis use, unspecified, uncomplicated: Secondary | ICD-10-CM | POA: Diagnosis present

## 2017-11-18 LAB — COMPREHENSIVE METABOLIC PANEL
ALBUMIN: 2.1 g/dL — AB (ref 3.5–5.0)
ALK PHOS: 121 U/L (ref 38–126)
ALT: 20 U/L (ref 0–44)
AST: 48 U/L — AB (ref 15–41)
Anion gap: 6 (ref 5–15)
BUN: 9 mg/dL (ref 6–20)
CALCIUM: 8.2 mg/dL — AB (ref 8.9–10.3)
CO2: 22 mmol/L (ref 22–32)
CREATININE: 1.1 mg/dL — AB (ref 0.44–1.00)
Chloride: 105 mmol/L (ref 98–111)
GFR calc Af Amer: >60 mL/min (ref >60)
GFR, EST NON AFRICAN AMERICAN: 56 mL/min — AB (ref >60)
GLUCOSE: 111 mg/dL — AB (ref 70–99)
Potassium: 4.3 mmol/L (ref 3.5–5.1)
Sodium: 133 mmol/L — ABNORMAL LOW (ref 135–145)
Total Bilirubin: 2.4 mg/dL — ABNORMAL HIGH (ref 0.3–1.2)
Total Protein: 7.5 g/dL (ref 6.5–8.1)

## 2017-11-18 LAB — CBC WITH DIFFERENTIAL/PLATELET
Abs Immature Granulocytes: 0.01 10*3/uL (ref 0.00–0.07)
Basophils Absolute: 0 10*3/uL (ref 0.0–0.1)
Basophils Relative: 1 %
EOS ABS: 0 10*3/uL (ref 0.0–0.5)
EOS PCT: 0 %
HEMATOCRIT: 43.6 % (ref 36.0–46.0)
Hemoglobin: 14.5 g/dL (ref 12.0–15.0)
IMMATURE GRANULOCYTES: 0 %
LYMPHS ABS: 1.1 10*3/uL (ref 0.7–4.0)
Lymphocytes Relative: 20 %
MCH: 35.3 pg — AB (ref 26.0–34.0)
MCHC: 33.3 g/dL (ref 30.0–36.0)
MCV: 106.1 fL — AB (ref 80.0–100.0)
MONO ABS: 1 10*3/uL (ref 0.1–1.0)
MONOS PCT: 17 %
Neutro Abs: 3.5 10*3/uL (ref 1.7–7.7)
Neutrophils Relative %: 62 %
Platelets: 160 10*3/uL (ref 150–400)
RBC: 4.11 MIL/uL (ref 3.87–5.11)
RDW: 13.3 % (ref 11.5–15.5)
WBC: 5.7 10*3/uL (ref 4.0–10.5)
nRBC: 0 % (ref 0.0–0.2)

## 2017-11-18 LAB — PROTIME-INR
INR: 1.54
Prothrombin Time: 18.3 seconds — ABNORMAL HIGH (ref 11.4–15.2)

## 2017-11-18 LAB — LIPASE, BLOOD: LIPASE: 22 U/L (ref 11–51)

## 2017-11-18 MED ORDER — SPIRONOLACTONE 25 MG PO TABS: 25.0000 mg | ORAL_TABLET | Freq: Every day | ORAL | Status: DC

## 2017-11-18 MED ORDER — TRAMADOL HCL 50 MG PO TABS
50.0000 mg | ORAL_TABLET | Freq: Two times a day (BID) | ORAL | Status: DC | PRN
  Administered 2017-11-18: 50 mg via ORAL
  Filled 2017-11-18: qty 1

## 2017-11-18 MED ORDER — LACTULOSE 10 GM/15ML PO SOLN
30.0000 g | Freq: Two times a day (BID) | ORAL | Status: DC
  Administered 2017-11-18 – 2017-11-19 (×2): 30 g via ORAL
  Filled 2017-11-18 (×2): qty 45

## 2017-11-18 MED ORDER — FOLIC ACID 1 MG PO TABS
1.0000 mg | ORAL_TABLET | Freq: Every day | ORAL | Status: DC
  Administered 2017-11-19: 1 mg via ORAL
  Filled 2017-11-18: qty 1

## 2017-11-18 MED ORDER — FUROSEMIDE 40 MG PO TABS: 40.0000 mg | ORAL_TABLET | Freq: Every day | ORAL | Status: DC

## 2017-11-18 NOTE — ED Triage Notes (Signed)
Per ems: pt coming from home c/o abdominal pain. 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  Been placed on new fluid pill recently that has not been effective. Was here a month ago and had 7L pulled off.
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
## 2017-11-18 NOTE — ED Notes (Signed)
Pt requesting pain medication and food
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
## 2017-11-18 NOTE — ED Notes (Signed)
Pt aware that urine sample is needed. Pt requesting pain medication

## 2017-11-18 NOTE — ED Notes (Signed)
Bed: WA02 Expected date:  Expected time:  Means of arrival:  Comments: EMS 52 yo female abdominal distention/ascites

## 2017-11-18 NOTE — ED Notes (Signed)
ED TO INPATIENT HANDOFF REPORT  Name/Age/Gender Linda Vaughn 52 y.o. female  Code Status Code Status History    Date Active Date Inactive Code Status Order ID Comments User Context   05/24/2017 0229 05/28/2017 1537 Full Code [id number]  Toy Baker, MD Inpatient   04/19/2017 2351 04/22/2017 1748 Full Code [id number]  Rory Percy, DO ED   03/25/2017 1055 03/27/2017 1657 Full Code [id number]  Samella Parr, NP ED   01/26/2017 1658 01/31/2017 1551 Full Code [id number]  Roxan Hockey, MD ED   11/24/2016 1035 11/28/2016 1847 Full Code [id number]  Jani Gravel, MD ED   10/08/2015 1012 10/09/2015 1432 Full Code [id number]  Murlean Iba, MD Inpatient   05/24/2015 2102 05/30/2015 1712 Full Code [id number]  Florencia Reasons, MD Inpatient      Home/SNF/Other Home  Chief Complaint Abdominal Pain  Level of Care/Admitting Diagnosis ED Disposition    ED Disposition Condition [place]: [facility] [[id number]]  Level of Care: Med-Surg [16]  Diagnosis: Abdominal distention [109323]  Admitting Physician: Shela Leff [[id number]]  Attending Physician: Shela Leff [[id number]]  PT Class (Do Not Modify): Observation [104]  PT Acc Code (Do Not Modify): Observation [10022]       Medical History Past Medical History:  Diagnosis Date  . Arthritis   . Ascites   . Cirrhosis of liver ([place])   . Gallstones   . Hepatitis C   . Hypertension   . Macrocytic anemia   . Recurrent right pleural effusion   . SBP (spontaneous bacterial peritonitis) ([place]) 11/23/2016  . Thrombocytopenia ([place])   . Umbilical hernia     Allergies Allergies  Allergen Reactions  . Aspirin Other (See Comments)    Liver damage   . Ibuprofen Other (See Comments)    Liver damage  . Tylenol [Acetaminophen] Other (See Comments)    Liver damage    IV Location/Drains/Wounds Patient Lines/Drains/Airways Status   Active Line/Drains/Airways    None           Labs/Imaging Results for orders placed or performed during the hospital encounter of 11/18/17 (from the past 48 hour(s))  Comprehensive metabolic panel     Status: Abnormal   Collection Time: 11/18/17  8:58 PM  Result Value Ref Range   Sodium 133 (L) 135 - 145 mmol/L   Potassium 4.3 3.5 - 5.1 mmol/L   Chloride 105 98 - 111 mmol/L   CO2 22 22 - 32 mmol/L   Glucose, Bld 111 (H) 70 - 99 mg/dL   BUN 9 6 - 20 mg/dL   Creatinine, Ser 1.10 (H) 0.44 - 1.00 mg/dL   Calcium 8.2 (L) 8.9 - 10.3 mg/dL   Total Protein 7.5 6.5 - 8.1 g/dL   Albumin 2.1 (L) 3.5 - 5.0 g/dL   AST 48 (H) 15 - 41 U/L   ALT 20 0 - 44 U/L   Alkaline Phosphatase 121 38 - 126 U/L   Total Bilirubin 2.4 (H) 0.3 - 1.2 mg/dL   GFR calc non Af Amer 56 (L) >60 mL/min   GFR calc Af Amer >60 >60 mL/min    Comment: (NOTE) The eGFR has been calculated using the CKD EPI equation. This calculation has not been validated in all clinical situations. eGFR's persistently <60 mL/min signify possible Chronic Kidney Disease.    Anion gap 6 5 - 15    Comment: Performed at [facility], [place] [address] [place], [place] [address]  Lipase, blood  Status: None   Collection Time: 11/18/17  8:58 PM  Result Value Ref Range   Lipase 22 11 - 51 U/L    Comment: Performed at [facility], [place] [address] [place], [place] [address]  CBC with Differential     Status: Abnormal   Collection Time: 11/18/17  8:58 PM  Result Value Ref Range   WBC 5.7 4.0 - 10.5 K/uL   RBC 4.11 3.87 - 5.11 MIL/uL   Hemoglobin 14.5 12.0 - 15.0 g/dL   HCT 43.6 36.0 - 46.0 %   MCV 106.1 (H) 80.0 - 100.0 fL   MCH 35.3 (H) 26.0 - 34.0 pg   MCHC 33.3 30.0 - 36.0 g/dL   RDW 13.3 11.5 - 15.5 %   Platelets 160 150 - 400 K/uL   nRBC 0.0 0.0 - 0.2 %   Neutrophils Relative % 62 %   Neutro Abs 3.5 1.7 - 7.7 K/uL   Lymphocytes Relative 20 %   Lymphs Abs 1.1 0.7 - 4.0 K/uL   Monocytes Relative 17 %   Monocytes Absolute 1.0 0.1 -  1.0 K/uL   Eosinophils Relative 0 %   Eosinophils Absolute 0.0 0.0 - 0.5 K/uL   Basophils Relative 1 %   Basophils Absolute 0.0 0.0 - 0.1 K/uL   Immature Granulocytes 0 %   Abs Immature Granulocytes 0.01 0.00 - 0.07 K/uL    Comment: Performed at [facility], [place] [address] [place], [place] [address]  Protime-INR     Status: Abnormal   Collection Time: 11/18/17  8:58 PM  Result Value Ref Range   Prothrombin Time 18.3 (H) 11.4 - 15.2 seconds   INR 1.54     Comment: Performed at [facility], [place] [address] [place], [place] [address]   No results found.  Pending Labs Unresulted Labs (From admission, onward)    Start     Ordered   11/18/17 2048  Urinalysis, Routine w reflex microscopic  ONCE - STAT,   STAT     11/18/17 2048   11/18/17 2048  Urine rapid drug screen (hosp performed)  ONCE - STAT,   STAT     11/18/17 2048          Vitals/Pain Today's Vitals   11/18/17 2043 11/18/17 2100 11/18/17 2130 11/18/17 2200  BP:  105/68 106/73 113/79  Pulse:  86 87 80  Resp:  (!) 34 20 (!) 26  Temp:      TempSrc:      SpO2:  98% 98% 97%  Weight: 92.5 kg     Height: 5' (1.524 m)     PainSc: 10-Worst pain ever       Isolation Precautions No active isolations  Medications Medications - No data to display  Mobility manual wheelchair (due to pain from fluid)

## 2017-11-19 ENCOUNTER — Encounter (HOSPITAL_COMMUNITY): Payer: Self-pay

## 2017-11-19 ENCOUNTER — Observation Stay (HOSPITAL_COMMUNITY): Payer: Medicaid Other

## 2017-11-19 DIAGNOSIS — N390 Urinary tract infection, site not specified: Secondary | ICD-10-CM

## 2017-11-19 DIAGNOSIS — N179 Acute kidney failure, unspecified: Secondary | ICD-10-CM | POA: Diagnosis not present

## 2017-11-19 DIAGNOSIS — F1290 Cannabis use, unspecified, uncomplicated: Secondary | ICD-10-CM | POA: Diagnosis present

## 2017-11-19 DIAGNOSIS — K7290 Hepatic failure, unspecified without coma: Secondary | ICD-10-CM | POA: Diagnosis present

## 2017-11-19 DIAGNOSIS — R140 Abdominal distension (gaseous): Secondary | ICD-10-CM

## 2017-11-19 DIAGNOSIS — E46 Unspecified protein-calorie malnutrition: Secondary | ICD-10-CM | POA: Diagnosis present

## 2017-11-19 DIAGNOSIS — F1010 Alcohol abuse, uncomplicated: Secondary | ICD-10-CM | POA: Diagnosis present

## 2017-11-19 DIAGNOSIS — F1490 Cocaine use, unspecified, uncomplicated: Secondary | ICD-10-CM

## 2017-11-19 DIAGNOSIS — E871 Hypo-osmolality and hyponatremia: Secondary | ICD-10-CM | POA: Diagnosis present

## 2017-11-19 LAB — RAPID URINE DRUG SCREEN, HOSP PERFORMED
Amphetamines: NOT DETECTED
BARBITURATES: NOT DETECTED
BENZODIAZEPINES: NOT DETECTED
Cocaine: POSITIVE — AB
Opiates: NOT DETECTED
TETRAHYDROCANNABINOL: POSITIVE — AB

## 2017-11-19 LAB — URINALYSIS, ROUTINE W REFLEX MICROSCOPIC
Glucose, UA: NEGATIVE mg/dL
Hgb urine dipstick: NEGATIVE
KETONES UR: 5 mg/dL — AB
Nitrite: NEGATIVE
PH: 5 (ref 5.0–8.0)
Protein, ur: 30 mg/dL — AB
SPECIFIC GRAVITY, URINE: 1.029 (ref 1.005–1.030)

## 2017-11-19 LAB — HEPATIC FUNCTION PANEL
ALK PHOS: 96 U/L (ref 38–126)
ALT: 16 U/L (ref 0–44)
AST: 43 U/L — ABNORMAL HIGH (ref 15–41)
Albumin: 1.8 g/dL — ABNORMAL LOW (ref 3.5–5.0)
BILIRUBIN DIRECT: 0.8 mg/dL — AB (ref 0.0–0.2)
BILIRUBIN INDIRECT: 1.2 mg/dL — AB (ref 0.3–0.9)
Total Bilirubin: 2 mg/dL — ABNORMAL HIGH (ref 0.3–1.2)
Total Protein: 6.2 g/dL — ABNORMAL LOW (ref 6.5–8.1)

## 2017-11-19 LAB — BASIC METABOLIC PANEL
Anion gap: 5 (ref 5–15)
BUN: 10 mg/dL (ref 6–20)
CALCIUM: 8 mg/dL — AB (ref 8.9–10.3)
CO2: 23 mmol/L (ref 22–32)
Chloride: 107 mmol/L (ref 98–111)
Creatinine, Ser: 0.98 mg/dL (ref 0.44–1.00)
GFR calc Af Amer: >60 mL/min (ref >60)
GLUCOSE: 115 mg/dL — AB (ref 70–99)
Potassium: 4 mmol/L (ref 3.5–5.1)
SODIUM: 135 mmol/L (ref 135–145)

## 2017-11-19 MED ORDER — ADULT MULTIVITAMIN W/MINERALS CH: 1.0000 | ORAL_TABLET | Freq: Every day | ORAL | 0 refills | Status: AC

## 2017-11-19 MED ORDER — ENSURE ENLIVE PO LIQD: 237.0000 mL | ORAL | Status: DC

## 2017-11-19 MED ORDER — LORAZEPAM 2 MG/ML IJ SOLN: 1.0000 mg | Freq: Four times a day (QID) | INTRAMUSCULAR | Status: DC | PRN

## 2017-11-19 MED ORDER — ACETAMINOPHEN 325 MG PO TABS: 650.0000 mg | ORAL_TABLET | Freq: Four times a day (QID) | ORAL | Status: DC | PRN

## 2017-11-19 MED ORDER — SPIRONOLACTONE 25 MG PO TABS: 100.0000 mg | ORAL_TABLET | Freq: Every day | ORAL | 0 refills | Status: DC

## 2017-11-19 MED ORDER — THIAMINE HCL 100 MG/ML IJ SOLN: 100.0000 mg | Freq: Every day | INTRAMUSCULAR | Status: DC

## 2017-11-19 MED ORDER — FUROSEMIDE 40 MG PO TABS: 40.0000 mg | ORAL_TABLET | Freq: Two times a day (BID) | ORAL | 0 refills | Status: DC

## 2017-11-19 MED ORDER — LIDOCAINE HCL 1 % IJ SOLN
INTRAMUSCULAR | Status: AC
  Filled 2017-11-19: qty 10

## 2017-11-19 MED ORDER — ENSURE ENLIVE PO LIQD: 237.0000 mL | ORAL | 12 refills | Status: AC

## 2017-11-19 MED ORDER — CIPROFLOXACIN HCL 500 MG PO TABS: 500.0000 mg | ORAL_TABLET | Freq: Two times a day (BID) | ORAL | 0 refills | Status: AC

## 2017-11-19 MED ORDER — FENTANYL CITRATE (PF) 100 MCG/2ML IJ SOLN
12.5000 ug | INTRAMUSCULAR | Status: DC | PRN
  Administered 2017-11-19 (×5): 12.5 ug via INTRAVENOUS
  Filled 2017-11-19 (×5): qty 2

## 2017-11-19 MED ORDER — VITAMIN B-1 100 MG PO TABS
100.0000 mg | ORAL_TABLET | Freq: Every day | ORAL | Status: DC
  Administered 2017-11-19: 100 mg via ORAL
  Filled 2017-11-19: qty 1

## 2017-11-19 MED ORDER — OXYCODONE HCL 5 MG PO TABS
10.0000 mg | ORAL_TABLET | Freq: Once | ORAL | Status: AC
  Administered 2017-11-19: 10 mg via ORAL
  Filled 2017-11-19: qty 2

## 2017-11-19 MED ORDER — ADULT MULTIVITAMIN W/MINERALS CH
1.0000 | ORAL_TABLET | Freq: Every day | ORAL | Status: DC
  Administered 2017-11-19: 1 via ORAL
  Filled 2017-11-19: qty 1

## 2017-11-19 MED ORDER — LORAZEPAM 1 MG PO TABS
1.0000 mg | ORAL_TABLET | Freq: Four times a day (QID) | ORAL | Status: DC | PRN
  Filled 2017-11-19: qty 1

## 2017-11-19 MED ORDER — TRAMADOL HCL 50 MG PO TABS
50.0000 mg | ORAL_TABLET | Freq: Two times a day (BID) | ORAL | Status: DC | PRN
  Administered 2017-11-19: 50 mg via ORAL
  Filled 2017-11-19: qty 1

## 2017-11-19 NOTE — Progress Notes (Signed)
rm [address] Longs Drug StoresJarryl Vaughn, with Ascites. CT wants to know if you want pt to have CT scan with or without oral contrast. Usually  Amount is 960cc  Thanks.

## 2017-11-19 NOTE — Progress Notes (Signed)
Initial Nutrition Assessment  DOCUMENTATION CODES:   Obesity unspecified  INTERVENTION:  -Ensure Enlive once daily, each supplement provides 350 kcal and 20 grams of protein Pt receiving MVI, thiamine, folic acid  NUTRITION DIAGNOSIS:   Increased nutrient needs related to chronic illness(Hep C w/ liver cirrhosis, umbilical hernia) as evidenced by energy intake < 75% for > 7 days, per patient/family report(2 paracentesis x2 weeks, removing 13.7L).   GOAL:   Patient will meet greater than or equal to 90% of their needs   MONITOR:   PO intake, Supplement acceptance, I & O's, Labs  REASON FOR ASSESSMENT:   Consult Assessment of nutrition requirement/status  ASSESSMENT:   52 year old female w/ medical history significant of Hep C with liver cirrhosis, and ascites, multiple paracentesis, HTN, polysubstance abuse (EtOH, cocaine,marijuana),ventral hernia x 40yrs who presented to ED on 11/13 with abdominal pain and distention.    Pt eating lunch at time of visit and reports her meal as being just ok and it would be better if she had salt. Pt understood reasoning and accepting of heart healthy diet. Pt reports changes to her food choices and tastes as she has gotten older and stated she will have a few grapes and water as a late night snack instead of cookies and chips. Pt enjoys eating freshly sliced lemons w/ salt and stated there is nothing better in the world, but knows that makes her blow up like a balloon and only eats that once and a while.   Pt recalls weighing 214lbs ~3 years ago and began power walking (sometimes 30 miles in 1 day) and lost 100lbs. Pt reports 114lbs 2 summers ago and wts trending up d/t chronic abdominal pain causing limited mobility.   Pt was seen at [facility] 10/08/17 and not a candidate for hernia surgery per MD note. Pt scheduled for CT to rule out hernia incarceration later today.   10/29/17 paracentesis: 7L 11/10/17 paracentesis: 6.7L  Medications: Folic  acid, Lasix, Lactulose, Thiamine, MVI, Fentanyl Labs: Ca corrects for low albumin 9.76    NUTRITION - FOCUSED PHYSICAL EXAM:    Most Recent Value  Orbital Region  Mild depletion  Upper Arm Region  Unable to assess [pt eating lunch at visit]  Thoracic and Lumbar Region  Unable to assess  Buccal Region  Mild depletion  Temple Region  Mild depletion  Clavicle Bone Region  Unable to assess  Clavicle and Acromion Bone Region  Unable to assess  Scapular Bone Region  Unable to assess  Dorsal Hand  Unable to assess  Patellar Region  Unable to assess  Anterior Thigh Region  Unable to assess  Posterior Calf Region  Unable to assess  Edema (RD Assessment)  Unable to assess  Hair  Unable to assess  Eyes  Unable to assess  Mouth  Unable to assess  Skin  Unable to assess  Nails  Unable to assess       Diet Order:   Diet Order            Diet Heart Room service appropriate? Yes; Fluid consistency: Thin  Diet effective now              EDUCATION NEEDS:   No education needs have been identified at this time  Skin:  Skin Assessment: Reviewed RN Assessment  Last BM:  11/18/17  Height:   Ht Readings from Last 1 Encounters:  11/18/17 5' (1.524 m)    Weight:   Wt Readings from Last 1 Encounters:  11/18/17 92.5 kg    Ideal Body Weight:  45.5 kg  BMI:  Body mass index is 39.84 kg/m.  Estimated Nutritional Needs:   Kcal:  1885-2030 (MSJ 1.3-1.4)  Protein:  60-73 grams (1.3-1.6g/kg ABW)  Fluid:  1.2L/day per MD   Lars MassonSuzanne Lajuan Godbee, RD, LDN  After Hours/Weekend Pager: [phone]

## 2017-11-19 NOTE — Procedures (Signed)
Ultrasound-guided  therapeutic paracentesis performed yielding 5.7 liters of yellow fluid. No immediate complications.  

## 2017-11-19 NOTE — Progress Notes (Signed)
I have reviewed and concur with this student's documentation.   

## 2017-11-19 NOTE — Discharge Summary (Signed)
Physician Discharge Summary  Linda Vaughn UJW:[id number] DOB: 1965-11-30 DOA: 11/18/2017  PCP: Mike Gip, FNP  Admit date: 11/18/2017 Discharge date: 11/19/2017  Admitted From: Home Disposition: Home  Recommendations for Outpatient Follow-up:  1. Follow up with PCP in 1 week.  Please check BMET as patient is being discharged on increased dose of Lasix and Aldactone. 2. Being discharged on 5-day course of oral ciprofloxacin for UTI.  Follow cultures.  Home Health: None Equipment/Devices: None  Discharge Condition: Fair CODE STATUS: Full code Diet recommendation: 2 g sodium    Discharge Diagnoses:  Principal Problem:   Decompensated hepatic cirrhosis (HCC)   Active Problems:   Ascites   HTN (hypertension)   AKI (acute kidney injury) (HCC)   Umbilical hernia   Abdominal distention   Hyponatremia   Protein calorie malnutrition (HCC)   Alcohol abuse Polysubstance abuse  Brief narrative/HPI 52 year old female with hepatitis C with decompensated liver cirrhosis with ascites, multiple paracentesis in the past 2 months, hypertension, polysubstance abuse, ventral hernia, depression presented to the ED with abdominal pain and increasing distention.  Reported an episode of vomiting on the morning with clear liquid.  Denies any fever or chills.  Patient has a ventral hernia for about 4 years.  He is on low-dose Lasix and Aldactone which is not helping with her abdominal distention.  Last alcohol use was almost 4 months back. Patient has had 2 large-volume paracentesis in the past 3 weeks (6-7 L on both occasions). In the ED patient is afebrile, normal WBC, mildly elevated AST.  Total bili of 2.4 and INR of 1.5. Placed on observation for paracentesis.   Hospital course Decompensated liver cirrhosis/HCV cirrhosis with ascites IR consulted and patient underwent ultrasound guided paracentesis with 5.7 L yellow fluid.  Tolerated well.  Abdominal pain better after the procedure.   Labs not sent.  (Will be treated empirically for SBP). Patient is on low-dose Lasix (40 mg daily) and Aldactone 50 mg daily.  I will double the dose and discharge her on Lasix 40 mg twice daily and Aldactone 100 mg daily given her recurrent ascites in the past 2 weeks requiring frequent paracentesis. She should follow-up with her PCP in 1 week to have her BMET checked. Patient reports following with GI in [facility] and says she has a colonoscopy scheduled in January next year.  I have instructed her to reschedule an earlier follow-up given her recurrent ascites. Patient strongly instructed to avoid alcohol.  Patient feels much better and abdominal pain significantly improved after large-volume paracentesis.  Stable to be discharged home.  UTI Patient complaining of dysuria.  UA positive for UTI.  Will send culture and discharge her on 5 days of oral ciprofloxacin that should cover for possible SBP as well.  Umbilical hernia Was seen by surgery at [facility] 5 weeks back and deemed not a safe surgical candidate for elective hernia repair.  CT abdomen done given her severe abdominal pain without signs of incarceration or obstruction.  Hyponatremia Mild.  Secondary to ascites.  Monitor as outpatient.  Protein calorie malnutrition Dietitian consulted and added supplement.  Essential hypertension Stable.  Increased Lasix and Aldactone dose.  Acute kidney injury Mild.  Creatinine 1.1 increased 1.7 baseline.  Improving a.m. lab.  Monitor renal function as outpatient while on increased dose of Lasix and Aldactone.  Polysubstance abuse with Urine drug screen positive for cocaine and THC.  Counseled strongly on cessation.  Procedure: CT abdomen and pelvis, large volume paracentesis  Consults: IR  Family communication: None at bedside      Discharge Instructions   Allergies as of 11/19/2017      Reactions   Aspirin Other (See Comments)   Liver damage    Ibuprofen Other (See  Comments)   Liver damage   Tylenol [acetaminophen] Other (See Comments)   Liver damage      Medication List    STOP taking these medications   thiamine 50 MG tablet Commonly known as:  VITAMIN B-1     TAKE these medications   ciprofloxacin 500 MG tablet Commonly known as:  CIPRO Take 1 tablet (500 mg total) by mouth 2 (two) times daily for 5 days.   feeding supplement (ENSURE ENLIVE) Liqd Take 237 mLs by mouth daily.   folic acid 1 MG tablet Commonly known as:  FOLVITE Take 1 tablet (1 mg total) by mouth daily.   furosemide 40 MG tablet Commonly known as:  LASIX Take 1 tablet (40 mg total) by mouth 2 (two) times daily. What changed:  when to take this   lactulose 10 GM/15ML solution Commonly known as:  CHRONULAC TAKE 45 MLS (30 G TOTAL) BY MOUTH 2 (TWO) TIMES DAILY.   multivitamin with minerals Tabs tablet Take 1 tablet by mouth daily. Start taking on:  11/20/2017   spironolactone 25 MG tablet Commonly known as:  ALDACTONE Take 4 tablets (100 mg total) by mouth daily. What changed:  how much to take      Follow-up Information    Mike Gipouglas, Andre, FNP. Schedule an appointment as soon as possible for a visit in 1 week(s).   Specialty:  Family Medicine Contact information: [address] [place] [address] [phone]          Allergies  Allergen Reactions  . Aspirin Other (See Comments)    Liver damage   . Ibuprofen Other (See Comments)    Liver damage  . Tylenol [Acetaminophen] Other (See Comments)    Liver damage      Procedures/Studies: Ct Abdomen Pelvis Wo Contrast  Result Date: 11/19/2017 CLINICAL DATA:  Increased abdominal distension and shortness of breath. Evaluate ventral hernia and ascites. History of cirrhosis. EXAM: CT ABDOMEN AND PELVIS WITHOUT CONTRAST TECHNIQUE: Multidetector CT imaging of the abdomen and pelvis was performed following the standard protocol without IV contrast. COMPARISON:  May 26, 2017 FINDINGS: Lower  chest: There is a small to moderate right pleural effusion with associated atelectasis. No other acute abnormalities in the lower chest. Hepatobiliary: A shrunken nodular liver is consistent with cirrhosis. Previous cholecystectomy. Pancreas: Unremarkable. No pancreatic ductal dilatation or surrounding inflammatory changes. Spleen: Normal in size without focal abnormality. Adrenals/Urinary Tract: Adrenal glands are unremarkable. Kidneys are normal, without renal calculi, focal lesion, or hydronephrosis. Bladder is unremarkable. Stomach/Bowel: The stomach, small bowel, and colon are normal. The appendix is not seen but there is no secondary evidence of appendicitis. Vascular/Lymphatic: Atherosclerosis in the nonaneurysmal aorta. No gross adenopathy. Reproductive: Uterus and bilateral adnexa are unremarkable. Other: Severe ascites, worsened in the interval. Ascites extends through the ventral hernia. The fluid in the ventral hernia appears septated. The fluid in the ventral hernia measures 10.3 by 12.7 cm today versus 5.6 x 11.9 cm previously. Increased attenuation in the subcutaneous fat consistent with volume overload, worse in the interval. Musculoskeletal: No acute or significant osseous findings. IMPRESSION: 1. Severe ascites, worsened in the interval. The fluid in the ventral hernia is septated and larger in the interval as well. 2. Small to moderate right  pleural effusion with atelectasis, unchanged. 3. Cirrhotic liver. 4. Atherosclerotic change in the aorta. Electronically Signed   By: Gerome Sam III M.D   On: 11/19/2017 00:36   US Paracentesis  Result Date: 11/19/2017 INDICATION: Patient with history of hepatitis-C, cirrhosis, recurrent ascites. Request made for therapeutic paracentesis. EXAM: ULTRASOUND GUIDED THERAPEUTIC PARACENTESIS MEDICATIONS: None COMPLICATIONS: None immediate. PROCEDURE: Informed written consent was obtained from the patient after a discussion of the risks, benefits and  alternatives to treatment. A timeout was performed prior to the initiation of the procedure. Initial ultrasound scanning demonstrates a large amount of ascites within the left mid to lower abdominal quadrant. The left mid to lower abdomen was prepped and draped in the usual sterile fashion. 1% lidocaine was used for local anesthesia. Following this, a 19 gauge, 10-cm, Yueh catheter was introduced. An ultrasound image was saved for documentation purposes. The paracentesis was performed. The catheter was removed and a dressing was applied. The patient tolerated the procedure well without immediate post procedural complication. FINDINGS: A total of approximately 5.7 liters of yellow fluid was removed. IMPRESSION: Successful ultrasound-guided therapeutic paracentesis yielding 5.7 liters of peritoneal fluid. Read by: Jeananne Rama, PA-C Electronically Signed   By: Simonne Come M.D.   On: 11/19/2017 15:48   US Paracentesis  Result Date: 11/10/2017 INDICATION: History of alcoholic cirrhosis/hepatitis and recurrent ascites. Request for diagnostic and therapeutic paracentesis. EXAM: ULTRASOUND GUIDED RIGHT LOWER QUADRANT PARACENTESIS MEDICATIONS: None. COMPLICATIONS: None immediate. PROCEDURE: Informed written consent was obtained from the patient after a discussion of the risks, benefits and alternatives to treatment. A timeout was performed prior to the initiation of the procedure. Initial ultrasound scanning demonstrates a large amount of ascites within the right lower abdominal quadrant. The right lower abdomen was prepped and draped in the usual sterile fashion. 1% lidocaine with epinephrine was used for local anesthesia. Following this, a 19 gauge, 10-cm, Yueh catheter was introduced. An ultrasound image was saved for documentation purposes. The paracentesis was performed. The catheter was removed and a dressing was applied. The patient tolerated the procedure well without immediate post procedural complication.  FINDINGS: A total of approximately 6.7 L of clear yellow fluid was removed. Samples were sent to the laboratory as requested by the clinical team. IMPRESSION: Successful ultrasound-guided paracentesis yielding 6.7 liters of peritoneal fluid. Read by: Brayton El PA-C Electronically Signed   By: Jolaine Click M.D.   On: 11/10/2017 13:04   US Paracentesis  Result Date: 10/29/2017 INDICATION: Patient with history of alcoholic cirrhosis/hepatitis and recurrent ascites. Request is made for diagnostic and therapeutic paracentesis. EXAM: ULTRASOUND GUIDED DIAGNOSTIC AND THERAPEUTIC PARACENTESIS MEDICATIONS: 10 mL 1% lidocaine COMPLICATIONS: None immediate. PROCEDURE: Informed written consent was obtained from the patient after a discussion of the risks, benefits and alternatives to treatment. A timeout was performed prior to the initiation of the procedure. Initial ultrasound scanning demonstrates a large amount of ascites within the right lower abdominal quadrant. The right lower abdomen was prepped and draped in the usual sterile fashion. 1% lidocaine was used for local anesthesia. Following this, a 19 gauge, 7-cm, Yueh catheter was introduced. An ultrasound image was saved for documentation purposes. The paracentesis was performed. The catheter was removed and a dressing was applied. The patient tolerated the procedure well without immediate post procedural complication. FINDINGS: A total of approximately 7 L of clear yellow fluid was removed. Samples were sent to the laboratory as requested by the clinical team. IMPRESSION: Successful ultrasound-guided paracentesis yielding 7 L of peritoneal fluid. Read  by: Elwin Mocha, PA-C Electronically Signed   By: Malachy Moan M.D.   On: 10/29/2017 17:18       Subjective: Complains of abdominal distention and pain.  Discharge Exam: Vitals:   11/19/17 1515 11/19/17 1526  BP: 104/68 107/73  Pulse:    Resp:    Temp:    SpO2:     Vitals:   11/19/17  1500 11/19/17 1505 11/19/17 1515 11/19/17 1526  BP: 100/65 103/72 104/68 107/73  Pulse:      Resp:      Temp:      TempSrc:      SpO2:      Weight:      Height:        General: Not in distress HEENT: No pallor, moist mucosa, supple neck Chest: Clear bilaterally CVS: Normal S1-S2, no murmurs GI: Abdominal distention with ascites + ventral hernia, some tenderness Musculoskeletal: Warm, no edema CNs: Alert and oriented, no tremors    The results of significant diagnostics from this hospitalization (including imaging, microbiology, ancillary and laboratory) are listed below for reference.     Microbiology: Recent Results (from the past 240 hour(s))  Body fluid culture     Status: None   Collection Time: 11/10/17 11:58 AM  Result Value Ref Range Status   Specimen Description   Final    Peritoneal Performed at [facility], [address], [place], [place] [address]    Special Requests   Final    NONE Performed at [facility], [address], [place], [place] [address]    Gram Stain NO WBC SEEN NO ORGANISMS SEEN   Final   Culture   Final    NO GROWTH 3 DAYS Performed at [facility], [address] [place], [place] [address]    Report Status 11/16/2017 FINAL  Final     Labs: BNP (last 3 results) No results for input(s): BNP in the last 8760 hours. Basic Metabolic Panel: Recent Labs  Lab 11/18/17 2058 11/19/17 0356  NA 133* 135  K 4.3 4.0  CL 105 107  CO2 22 23  GLUCOSE 111* 115*  BUN 9 10  CREATININE 1.10* 0.98  CALCIUM 8.2* 8.0*   Liver Function Tests: Recent Labs  Lab 11/18/17 2058 11/19/17 0356  AST 48* 43*  ALT 20 16  ALKPHOS 121 96  BILITOT 2.4* 2.0*  PROT 7.5 6.2*  ALBUMIN 2.1* 1.8*   Recent Labs  Lab 11/18/17 2058  LIPASE 22   No results for input(s): AMMONIA in the last 168 hours. CBC: Recent Labs  Lab 11/18/17 2058  WBC 5.7  NEUTROABS 3.5  HGB 14.5  HCT 43.6  MCV 106.1*  PLT 160    Cardiac Enzymes: No results for input(s): CKTOTAL, CKMB, CKMBINDEX, TROPONINI in the last 168 hours. BNP: Invalid input(s): POCBNP CBG: No results for input(s): GLUCAP in the last 168 hours. D-Dimer No results for input(s): DDIMER in the last 72 hours. Hgb A1c No results for input(s): HGBA1C in the last 72 hours. Lipid Profile No results for input(s): CHOL, HDL, LDLCALC, TRIG, CHOLHDL, LDLDIRECT in the last 72 hours. Thyroid function studies No results for input(s): TSH, T4TOTAL, T3FREE, THYROIDAB in the last 72 hours.  Invalid input(s): FREET3 Anemia work up No results for input(s): VITAMINB12, FOLATE, FERRITIN, TIBC, IRON, RETICCTPCT in the last 72 hours. Urinalysis    Component Value Date/Time   COLORURINE AMBER (A) 11/19/2017 0524   APPEARANCEUR CLOUDY (A) 11/19/2017 6962  LABSPEC 1.029 11/19/2017 0524   PHURINE 5.0 11/19/2017 0524   GLUCOSEU NEGATIVE 11/19/2017 0524   HGBUR NEGATIVE 11/19/2017 0524   BILIRUBINUR SMALL (A) 11/19/2017 0524   BILIRUBINUR negative 06/11/2017 1139   KETONESUR 5 (A) 11/19/2017 0524   PROTEINUR 30 (A) 11/19/2017 0524   UROBILINOGEN 0.2 06/11/2017 1139   UROBILINOGEN 4.0 (H) 03/16/2017 1041   NITRITE NEGATIVE 11/19/2017 0524   LEUKOCYTESUR MODERATE (A) 11/19/2017 0524   Sepsis Labs Invalid input(s): PROCALCITONIN,  WBC,  LACTICIDVEN Microbiology Recent Results (from the past 240 hour(s))  Body fluid culture     Status: None   Collection Time: 11/10/17 11:58 AM  Result Value Ref Range Status   Specimen Description   Final    Peritoneal Performed at [facility], [address] [place], [place] [address]    Special Requests   Final    NONE Performed at [facility], [address], [place], [place] [address]    Gram Stain NO WBC SEEN NO ORGANISMS SEEN   Final   Culture   Final    NO GROWTH 3 DAYS Performed at [facility], [address] [place], [place] [address]    Report Status  11/16/2017 FINAL  Final     Time coordinating discharge: <30 minutes  SIGNED:   Eddie North, MD  [facility] 11/19/2017, 4:10 PM Pager   If 7PM-7AM, please contact night-coverage [email or web address] Password [email or web address]

## 2017-11-19 NOTE — Progress Notes (Signed)
Assessment unchanged. Pt verbalized understanding of dc instructions through teach back including follow up care. Scripts x 4 given per MD. Discharged via wc to front entrance by NT.

## 2017-11-22 LAB — URINE CULTURE

## 2017-11-24 ENCOUNTER — Other Ambulatory Visit: Payer: Self-pay

## 2017-11-24 DIAGNOSIS — K7031 Alcoholic cirrhosis of liver with ascites: Secondary | ICD-10-CM

## 2017-11-24 MED ORDER — LACTULOSE 10 GM/15ML PO SOLN: 30.0000 g | Freq: Two times a day (BID) | ORAL | 1 refills | Status: AC

## 2017-11-25 ENCOUNTER — Other Ambulatory Visit: Payer: Self-pay

## 2017-11-25 ENCOUNTER — Observation Stay (HOSPITAL_COMMUNITY)
Admission: EM | Admit: 2017-11-25 | Discharge: 2017-11-27 | Disposition: A | Discharge status: Home / Self Care | Payer: Medicaid Other | Attending: Family Medicine | Admitting: Family Medicine

## 2017-11-25 ENCOUNTER — Encounter (HOSPITAL_COMMUNITY): Payer: Self-pay | Admitting: Emergency Medicine

## 2017-11-25 DIAGNOSIS — F1911 Other psychoactive substance abuse, in remission: Secondary | ICD-10-CM | POA: Diagnosis not present

## 2017-11-25 DIAGNOSIS — D696 Thrombocytopenia, unspecified: Secondary | ICD-10-CM | POA: Insufficient documentation

## 2017-11-25 DIAGNOSIS — N179 Acute kidney failure, unspecified: Secondary | ICD-10-CM | POA: Insufficient documentation

## 2017-11-25 DIAGNOSIS — Z6841 Body Mass Index (BMI) 40.0 and over, adult: Secondary | ICD-10-CM | POA: Diagnosis not present

## 2017-11-25 DIAGNOSIS — G9341 Metabolic encephalopathy: Secondary | ICD-10-CM | POA: Diagnosis not present

## 2017-11-25 DIAGNOSIS — F329 Major depressive disorder, single episode, unspecified: Secondary | ICD-10-CM | POA: Insufficient documentation

## 2017-11-25 DIAGNOSIS — K439 Ventral hernia without obstruction or gangrene: Secondary | ICD-10-CM | POA: Diagnosis not present

## 2017-11-25 DIAGNOSIS — I10 Essential (primary) hypertension: Secondary | ICD-10-CM | POA: Insufficient documentation

## 2017-11-25 DIAGNOSIS — J90 Pleural effusion, not elsewhere classified: Secondary | ICD-10-CM | POA: Insufficient documentation

## 2017-11-25 DIAGNOSIS — R188 Other ascites: Secondary | ICD-10-CM | POA: Diagnosis not present

## 2017-11-25 DIAGNOSIS — F1010 Alcohol abuse, uncomplicated: Secondary | ICD-10-CM | POA: Insufficient documentation

## 2017-11-25 DIAGNOSIS — R140 Abdominal distension (gaseous): Secondary | ICD-10-CM

## 2017-11-25 DIAGNOSIS — B182 Chronic viral hepatitis C: Secondary | ICD-10-CM | POA: Diagnosis not present

## 2017-11-25 DIAGNOSIS — Z79899 Other long term (current) drug therapy: Secondary | ICD-10-CM | POA: Diagnosis not present

## 2017-11-25 DIAGNOSIS — R110 Nausea: Secondary | ICD-10-CM | POA: Diagnosis not present

## 2017-11-25 DIAGNOSIS — R1013 Epigastric pain: Secondary | ICD-10-CM | POA: Insufficient documentation

## 2017-11-25 DIAGNOSIS — M1990 Unspecified osteoarthritis, unspecified site: Secondary | ICD-10-CM | POA: Insufficient documentation

## 2017-11-25 DIAGNOSIS — Z9049 Acquired absence of other specified parts of digestive tract: Secondary | ICD-10-CM | POA: Diagnosis not present

## 2017-11-25 DIAGNOSIS — K7031 Alcoholic cirrhosis of liver with ascites: Principal | ICD-10-CM | POA: Insufficient documentation

## 2017-11-25 DIAGNOSIS — E46 Unspecified protein-calorie malnutrition: Secondary | ICD-10-CM | POA: Diagnosis not present

## 2017-11-25 DIAGNOSIS — R102 Pelvic and perineal pain: Secondary | ICD-10-CM | POA: Insufficient documentation

## 2017-11-25 DIAGNOSIS — E669 Obesity, unspecified: Secondary | ICD-10-CM | POA: Diagnosis not present

## 2017-11-25 DIAGNOSIS — F17200 Nicotine dependence, unspecified, uncomplicated: Secondary | ICD-10-CM | POA: Insufficient documentation

## 2017-11-25 DIAGNOSIS — I700 Atherosclerosis of aorta: Secondary | ICD-10-CM | POA: Diagnosis not present

## 2017-11-25 DIAGNOSIS — K7290 Hepatic failure, unspecified without coma: Secondary | ICD-10-CM | POA: Insufficient documentation

## 2017-11-25 DIAGNOSIS — Z886 Allergy status to analgesic agent status: Secondary | ICD-10-CM | POA: Insufficient documentation

## 2017-11-25 LAB — COMPREHENSIVE METABOLIC PANEL
ALBUMIN: 2 g/dL — AB (ref 3.5–5.0)
ALK PHOS: 145 U/L — AB (ref 38–126)
ALT: 22 U/L (ref 0–44)
ANION GAP: 5 (ref 5–15)
AST: 48 U/L — ABNORMAL HIGH (ref 15–41)
BILIRUBIN TOTAL: 2 mg/dL — AB (ref 0.3–1.2)
BUN: 9 mg/dL (ref 6–20)
CALCIUM: 8.4 mg/dL — AB (ref 8.9–10.3)
CO2: 25 mmol/L (ref 22–32)
Chloride: 107 mmol/L (ref 98–111)
Creatinine, Ser: 0.74 mg/dL (ref 0.44–1.00)
GFR calc Af Amer: >60 mL/min (ref >60)
GFR calc non Af Amer: >60 mL/min (ref >60)
GLUCOSE: 98 mg/dL (ref 70–99)
Potassium: 4.1 mmol/L (ref 3.5–5.1)
Sodium: 137 mmol/L (ref 135–145)
TOTAL PROTEIN: 7.4 g/dL (ref 6.5–8.1)

## 2017-11-25 LAB — CBC WITH DIFFERENTIAL/PLATELET
Abs Immature Granulocytes: 0.01 10*3/uL (ref 0.00–0.07)
BASOS ABS: 0 10*3/uL (ref 0.0–0.1)
BASOS PCT: 1 %
EOS ABS: 0.2 10*3/uL (ref 0.0–0.5)
Eosinophils Relative: 5 %
HEMATOCRIT: 40.8 % (ref 36.0–46.0)
HEMOGLOBIN: 13.7 g/dL (ref 12.0–15.0)
Immature Granulocytes: 0 %
LYMPHS ABS: 1.8 10*3/uL (ref 0.7–4.0)
Lymphocytes Relative: 38 %
MCH: 36.1 pg — ABNORMAL HIGH (ref 26.0–34.0)
MCHC: 33.6 g/dL (ref 30.0–36.0)
MCV: 107.4 fL — ABNORMAL HIGH (ref 80.0–100.0)
Monocytes Absolute: 1 10*3/uL (ref 0.1–1.0)
Monocytes Relative: 21 %
NEUTROS PCT: 35 %
NRBC: 0 % (ref 0.0–0.2)
Neutro Abs: 1.6 10*3/uL — ABNORMAL LOW (ref 1.7–7.7)
Platelets: 149 10*3/uL — ABNORMAL LOW (ref 150–400)
RBC: 3.8 MIL/uL — ABNORMAL LOW (ref 3.87–5.11)
RDW: 13.6 % (ref 11.5–15.5)
WBC: 4.6 10*3/uL (ref 4.0–10.5)

## 2017-11-25 LAB — URINALYSIS, ROUTINE W REFLEX MICROSCOPIC
Bilirubin Urine: NEGATIVE
Glucose, UA: NEGATIVE mg/dL
HGB URINE DIPSTICK: NEGATIVE
Ketones, ur: NEGATIVE mg/dL
Nitrite: POSITIVE — AB
PROTEIN: NEGATIVE mg/dL
Specific Gravity, Urine: 1.025 (ref 1.005–1.030)
pH: 6 (ref 5.0–8.0)

## 2017-11-25 LAB — LIPASE, BLOOD: Lipase: 27 U/L (ref 11–51)

## 2017-11-25 LAB — PROTIME-INR
INR: 1.53
Prothrombin Time: 18.2 seconds — ABNORMAL HIGH (ref 11.4–15.2)

## 2017-11-25 LAB — APTT: aPTT: 39 seconds — ABNORMAL HIGH (ref 24–36)

## 2017-11-25 MED ORDER — POLYETHYLENE GLYCOL 3350 17 G PO PACK
17.0000 g | PACK | Freq: Every day | ORAL | Status: DC
  Filled 2017-11-25 (×2): qty 1

## 2017-11-25 MED ORDER — LACTULOSE 10 GM/15ML PO SOLN
30.0000 g | Freq: Two times a day (BID) | ORAL | Status: DC
  Administered 2017-11-25 – 2017-11-27 (×3): 30 g via ORAL
  Filled 2017-11-25 (×4): qty 45

## 2017-11-25 MED ORDER — MORPHINE SULFATE (PF) 2 MG/ML IV SOLN
2.0000 mg | INTRAVENOUS | Status: DC | PRN
  Administered 2017-11-25 (×2): 2 mg via INTRAVENOUS
  Filled 2017-11-25 (×2): qty 1

## 2017-11-25 MED ORDER — FOLIC ACID 1 MG PO TABS
1.0000 mg | ORAL_TABLET | Freq: Every day | ORAL | Status: DC
  Administered 2017-11-26 – 2017-11-27 (×2): 1 mg via ORAL
  Filled 2017-11-25 (×2): qty 1

## 2017-11-25 MED ORDER — SPIRONOLACTONE 25 MG PO TABS
100.0000 mg | ORAL_TABLET | Freq: Every day | ORAL | Status: DC
  Administered 2017-11-26 – 2017-11-27 (×2): 100 mg via ORAL
  Filled 2017-11-25 (×2): qty 4

## 2017-11-25 MED ORDER — OXYCODONE HCL 5 MG PO TABS
5.0000 mg | ORAL_TABLET | ORAL | Status: DC | PRN
  Administered 2017-11-25 – 2017-11-27 (×10): 5 mg via ORAL
  Filled 2017-11-25 (×10): qty 1

## 2017-11-25 MED ORDER — SODIUM CHLORIDE 0.9 % IV SOLN
2.0000 g | INTRAVENOUS | Status: DC
  Administered 2017-11-25: 2 g via INTRAVENOUS
  Filled 2017-11-25: qty 20
  Filled 2017-11-25: qty 2

## 2017-11-25 MED ORDER — MORPHINE SULFATE (PF) 4 MG/ML IV SOLN
4.0000 mg | Freq: Once | INTRAVENOUS | Status: AC
  Administered 2017-11-25: 4 mg via INTRAVENOUS
  Filled 2017-11-25: qty 1

## 2017-11-25 MED ORDER — SODIUM CHLORIDE 0.9% FLUSH
3.0000 mL | Freq: Two times a day (BID) | INTRAVENOUS | Status: DC
  Administered 2017-11-25 – 2017-11-27 (×2): 3 mL via INTRAVENOUS

## 2017-11-25 MED ORDER — MORPHINE SULFATE (PF) 2 MG/ML IV SOLN
2.0000 mg | Freq: Four times a day (QID) | INTRAVENOUS | Status: DC | PRN
  Administered 2017-11-26: 2 mg via INTRAVENOUS
  Filled 2017-11-25: qty 1

## 2017-11-25 MED ORDER — FUROSEMIDE 40 MG PO TABS
40.0000 mg | ORAL_TABLET | Freq: Every day | ORAL | Status: DC
  Administered 2017-11-26 – 2017-11-27 (×2): 40 mg via ORAL
  Filled 2017-11-25 (×2): qty 1

## 2017-11-25 MED ORDER — ENSURE ENLIVE PO LIQD
237.0000 mL | ORAL | Status: DC
  Administered 2017-11-26: 237 mL via ORAL

## 2017-11-25 MED ORDER — ADULT MULTIVITAMIN W/MINERALS CH
1.0000 | ORAL_TABLET | Freq: Every day | ORAL | Status: DC
  Administered 2017-11-26 – 2017-11-27 (×2): 1 via ORAL
  Filled 2017-11-25 (×2): qty 1

## 2017-11-25 NOTE — ED Notes (Signed)
Attempted report, nurse to CB.

## 2017-11-25 NOTE — ED Notes (Signed)
ED TO INPATIENT HANDOFF REPORT  Name/Age/Gender Linda Vaughn 52 y.o. female  Code Status Code Status History    Date Active Date Inactive Code Status Order ID Comments User Context   11/18/2017 2338 11/19/2017 2132 Full Code [id number]  Shela Leff, MD Inpatient   05/24/2017 0229 05/28/2017 1537 Full Code [id number]  Toy Baker, MD Inpatient   04/19/2017 2351 04/22/2017 1748 Full Code [id number]  Rory Percy, DO ED   03/25/2017 1055 03/27/2017 1657 Full Code [id number]  Samella Parr, NP ED   01/26/2017 1658 01/31/2017 1551 Full Code [id number]  Roxan Hockey, MD ED   11/24/2016 1035 11/28/2016 1847 Full Code [id number]  Jani Gravel, MD ED   10/08/2015 1012 10/09/2015 1432 Full Code [id number]  Murlean Iba, MD Inpatient   05/24/2015 2102 05/30/2015 1712 Full Code [id number]  Florencia Reasons, MD Inpatient      Home/SNF/Other Home  Chief Complaint abd pain  Level of Care/Admitting Diagnosis ED Disposition    ED Disposition Condition Comment   Admit  The patient appears reasonably stabilized for admission considering the current resources, flow, and capabilities available in the ED at this time, and I doubt any other [facility] requiring further screening and/or treatment in the ED prior to admission is  present.       Medical History Past Medical History:  Diagnosis Date  . Arthritis   . Ascites   . Cirrhosis of liver ([place])   . Gallstones   . Hepatitis C   . Hypertension   . Macrocytic anemia   . Recurrent right pleural effusion   . SBP (spontaneous bacterial peritonitis) ([place]) 11/23/2016  . Thrombocytopenia ([place])   . Umbilical hernia     Allergies Allergies  Allergen Reactions  . Aspirin Other (See Comments)    Liver damage   . Ibuprofen Other (See Comments)    Liver damage  . Tylenol [Acetaminophen] Other (See Comments)    Liver damage    IV Location/Drains/Wounds Patient Lines/Drains/Airways Status   Active Line/Drains/Airways    Name:   Placement  date:   Placement time:   Site:   Days:   Peripheral IV 11/25/17 Right Antecubital   11/25/17    1324    Antecubital   less than 1          Labs/Imaging Results for orders placed or performed during the hospital encounter of 11/25/17 (from the past 48 hour(s))  CBC with Differential/Platelet     Status: Abnormal   Collection Time: 11/25/17  1:26 PM  Result Value Ref Range   WBC 4.6 4.0 - 10.5 K/uL   RBC 3.80 (L) 3.87 - 5.11 MIL/uL   Hemoglobin 13.7 12.0 - 15.0 g/dL   HCT 40.8 36.0 - 46.0 %   MCV 107.4 (H) 80.0 - 100.0 fL   MCH 36.1 (H) 26.0 - 34.0 pg   MCHC 33.6 30.0 - 36.0 g/dL   RDW 13.6 11.5 - 15.5 %   Platelets 149 (L) 150 - 400 K/uL   nRBC 0.0 0.0 - 0.2 %   Neutrophils Relative % 35 %   Neutro Abs 1.6 (L) 1.7 - 7.7 K/uL   Lymphocytes Relative 38 %   Lymphs Abs 1.8 0.7 - 4.0 K/uL   Monocytes Relative 21 %   Monocytes Absolute 1.0 0.1 - 1.0 K/uL   Eosinophils Relative 5 %   Eosinophils Absolute 0.2 0.0 - 0.5 K/uL   Basophils Relative 1 %   Basophils Absolute 0.0 0.0 - 0.1 K/uL  Immature Granulocytes 0 %   Abs Immature Granulocytes 0.01 0.00 - 0.07 K/uL    Comment: Performed at [facility], [address] [place], [place] [address]  Comprehensive metabolic panel     Status: Abnormal   Collection Time: 11/25/17  1:26 PM  Result Value Ref Range   Sodium 137 135 - 145 mmol/L   Potassium 4.1 3.5 - 5.1 mmol/L   Chloride 107 98 - 111 mmol/L   CO2 25 22 - 32 mmol/L   Glucose, Bld 98 70 - 99 mg/dL   BUN 9 6 - 20 mg/dL   Creatinine, Ser 0.74 0.44 - 1.00 mg/dL   Calcium 8.4 (L) 8.9 - 10.3 mg/dL   Total Protein 7.4 6.5 - 8.1 g/dL   Albumin 2.0 (L) 3.5 - 5.0 g/dL   AST 48 (H) 15 - 41 U/L   ALT 22 0 - 44 U/L   Alkaline Phosphatase 145 (H) 38 - 126 U/L   Total Bilirubin 2.0 (H) 0.3 - 1.2 mg/dL   GFR calc non Af Amer >60 >60 mL/min   GFR calc Af Amer >60 >60 mL/min    Comment: (NOTE) The eGFR has been calculated using the CKD EPI equation. This  calculation has not been validated in all clinical situations. eGFR's persistently <60 mL/min signify possible Chronic Kidney Disease.    Anion gap 5 5 - 15    Comment: Performed at [facility], [address] [place], [place] [address]  Lipase, blood     Status: None   Collection Time: 11/25/17  1:26 PM  Result Value Ref Range   Lipase 27 11 - 51 U/L    Comment: Performed at [facility], [address] [place], [place] [address]  Protime-INR     Status: Abnormal   Collection Time: 11/25/17  1:26 PM  Result Value Ref Range   Prothrombin Time 18.2 (H) 11.4 - 15.2 seconds   INR 1.53     Comment: Performed at [facility], [address] [place], [place] [address]  APTT     Status: Abnormal   Collection Time: 11/25/17  1:26 PM  Result Value Ref Range   aPTT 39 (H) 24 - 36 seconds    Comment:        IF BASELINE aPTT IS ELEVATED, SUGGEST PATIENT RISK ASSESSMENT BE USED TO DETERMINE APPROPRIATE ANTICOAGULANT THERAPY. Performed at  [facility], [address] [place], [place] [address]    No results found.  Pending Labs Unresulted Labs (From admission, onward)    Start     Ordered   11/25/17 1313  Urinalysis, Routine w reflex microscopic  Once,   R     11/25/17 1313          Vitals/Pain Today's Vitals   11/25/17 1300 11/25/17 1332 11/25/17 1340 11/25/17 1401  BP: (!) 114/92 112/87  115/83  Pulse: 82 83  71  Resp: 16 16  16  Temp:      TempSrc:      SpO2: 100% 99%  100%  PainSc:  6  6      Isolation Precautions No active isolations  Medications Medications  morphine 4 MG/ML injection 4 mg (4 mg Intravenous Given 11/25/17 1323)    Mobility walks  
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
## 2017-11-25 NOTE — ED Notes (Signed)
ED TO INPATIENT HANDOFF REPORT  Name/Age/Gender Linda Vaughn 52 y.o. female  Code Status Code Status History    Date Active Date Inactive Code Status Order ID Comments User Context   11/18/2017 2338 11/19/2017 2132 Full Code [id number]  Shela Leff, MD Inpatient   05/24/2017 0229 05/28/2017 1537 Full Code [id number]  Toy Baker, MD Inpatient   04/19/2017 2351 04/22/2017 1748 Full Code [id number]  Rory Percy, DO ED   03/25/2017 1055 03/27/2017 1657 Full Code [id number]  Samella Parr, NP ED   01/26/2017 1658 01/31/2017 1551 Full Code [id number]  Roxan Hockey, MD ED   11/24/2016 1035 11/28/2016 1847 Full Code [id number]  Jani Gravel, MD ED   10/08/2015 1012 10/09/2015 1432 Full Code [id number]  Murlean Iba, MD Inpatient   05/24/2015 2102 05/30/2015 1712 Full Code [id number]  Florencia Reasons, MD Inpatient      Home/SNF/Other Home  Chief Complaint abd pain  Level of Care/Admitting Diagnosis ED Disposition    ED Disposition Condition [place]: [facility] [110315]  Level of Care: Med-Surg [16]  Diagnosis: Ascites [945859]  Admitting Physician: Elodia Florence [phone]  Attending Physician: Cephus Slater, A CALDWELL [phone]  PT Class (Do Not Modify): Observation [104]  PT Acc Code (Do Not Modify): Observation [10022]       Medical History Past Medical History:  Diagnosis Date  . Arthritis   . Ascites   . Cirrhosis of liver ([place])   . Gallstones   . Hepatitis C   . Hypertension   . Macrocytic anemia   . Recurrent right pleural effusion   . SBP (spontaneous bacterial peritonitis) ([place]) 11/23/2016  . Thrombocytopenia ([place])   . Umbilical hernia     Allergies Allergies  Allergen Reactions  . Aspirin Other (See Comments)    Liver damage   . Ibuprofen Other (See Comments)    Liver damage  . Tylenol [Acetaminophen] Other (See Comments)    Liver damage    IV Location/Drains/Wounds Patient Lines/Drains/Airways  Status   Active Line/Drains/Airways    Name:   Placement date:   Placement time:   Site:   Days:   Peripheral IV 11/25/17 Right Antecubital   11/25/17    1324    Antecubital   less than 1          Labs/Imaging Results for orders placed or performed during the hospital encounter of 11/25/17 (from the past 48 hour(s))  CBC with Differential/Platelet     Status: Abnormal   Collection Time: 11/25/17  1:26 PM  Result Value Ref Range   WBC 4.6 4.0 - 10.5 K/uL   RBC 3.80 (L) 3.87 - 5.11 MIL/uL   Hemoglobin 13.7 12.0 - 15.0 g/dL   HCT 40.8 36.0 - 46.0 %   MCV 107.4 (H) 80.0 - 100.0 fL   MCH 36.1 (H) 26.0 - 34.0 pg   MCHC 33.6 30.0 - 36.0 g/dL   RDW 13.6 11.5 - 15.5 %   Platelets 149 (L) 150 - 400 K/uL   nRBC 0.0 0.0 - 0.2 %   Neutrophils Relative % 35 %   Neutro Abs 1.6 (L) 1.7 - 7.7 K/uL   Lymphocytes Relative 38 %   Lymphs Abs 1.8 0.7 - 4.0 K/uL   Monocytes Relative 21 %   Monocytes Absolute 1.0 0.1 - 1.0 K/uL   Eosinophils Relative 5 %   Eosinophils Absolute 0.2 0.0 - 0.5 K/uL   Basophils Relative 1 %  Basophils Absolute 0.0 0.0 - 0.1 K/uL   Immature Granulocytes 0 %   Abs Immature Granulocytes 0.01 0.00 - 0.07 K/uL    Comment: Performed at [facility], [place] [address] [place], [place] [address]  Comprehensive metabolic panel     Status: Abnormal   Collection Time: 11/25/17  1:26 PM  Result Value Ref Range   Sodium 137 135 - 145 mmol/L   Potassium 4.1 3.5 - 5.1 mmol/L   Chloride 107 98 - 111 mmol/L   CO2 25 22 - 32 mmol/L   Glucose, Bld 98 70 - 99 mg/dL   BUN 9 6 - 20 mg/dL   Creatinine, Ser 0.74 0.44 - 1.00 mg/dL   Calcium 8.4 (L) 8.9 - 10.3 mg/dL   Total Protein 7.4 6.5 - 8.1 g/dL   Albumin 2.0 (L) 3.5 - 5.0 g/dL   AST 48 (H) 15 - 41 U/L   ALT 22 0 - 44 U/L   Alkaline Phosphatase 145 (H) 38 - 126 U/L   Total Bilirubin 2.0 (H) 0.3 - 1.2 mg/dL   GFR calc non Af Amer >60 >60 mL/min   GFR calc Af Amer >60 >60 mL/min    Comment: (NOTE) The eGFR  has been calculated using the CKD EPI equation. This calculation has not been validated in all clinical situations. eGFR's persistently <60 mL/min signify possible Chronic Kidney Disease.    Anion gap 5 5 - 15    Comment: Performed at [facility], [place] [address], [place], [place] [address]  Lipase, blood     Status: None   Collection Time: 11/25/17  1:26 PM  Result Value Ref Range   Lipase 27 11 - 51 U/L    Comment: Performed at [facility], [place] [address], [place], [place] [address]  Protime-INR     Status: Abnormal   Collection Time: 11/25/17  1:26 PM  Result Value Ref Range   Prothrombin Time 18.2 (H) 11.4 - 15.2 seconds   INR 1.53     Comment: Performed at [facility], [place] [address], [place], [place] [address]  APTT     Status: Abnormal   Collection Time: 11/25/17  1:26 PM  Result Value Ref Range   aPTT 39 (H) 24 - 36 seconds    Comment:        IF BASELINE aPTT IS ELEVATED, SUGGEST PATIENT RISK ASSESSMENT BE USED TO DETERMINE APPROPRIATE ANTICOAGULANT THERAPY. Performed at [facility], [place] [address], [place], [place] [address]    No results found.  Pending Labs Unresulted Labs (From admission, onward)    Start     Ordered   11/25/17 1313  Urinalysis, Routine w reflex microscopic  Once,   R     11/25/17 1313   Signed and Held  Comprehensive metabolic panel  Tomorrow morning,   R     Signed and Held   Signed and Held  CBC  Tomorrow morning,   R     Signed and Held   Signed and Held  Protime-INR  Tomorrow morning,   R     Signed and Held          Vitals/Pain Today's Vitals   11/25/17 1503 11/25/17 1530 11/25/17 1600 11/25/17 1700  BP: 115/77 115/79 113/72 111/73  Pulse: 79 68 77 71  Resp: _0 Temp:      TempSrc:      SpO2: 98% 100% 98% 98%  PainSc:        Isolation Precautions No active isolations  Medications Medications  morphine 2 MG/ML injection 2 mg (2 mg  Intravenous Given 11/25/17 1708)  morphine 4 MG/ML injection 4 mg (4 mg Intravenous Given 11/25/17 1323)    Mobility walks

## 2017-11-25 NOTE — ED Notes (Signed)
Pt given gingerale and sandwich
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
## 2017-11-25 NOTE — ED Notes (Signed)
Pt unable to void at this time. 

## 2017-11-25 NOTE — ED Triage Notes (Signed)
Patient reports "I need a paracentesis." C/o abdominal pain since last night. Reports "it's pushing on my umbilical hernia." Reports taking lasix as prescribed. Reports last paracentesis was when she was seen on 11/13.

## 2017-11-26 ENCOUNTER — Encounter (HOSPITAL_COMMUNITY): Payer: Self-pay | Admitting: Radiology

## 2017-11-26 ENCOUNTER — Observation Stay (HOSPITAL_COMMUNITY): Payer: Medicaid Other

## 2017-11-26 DIAGNOSIS — R188 Other ascites: Secondary | ICD-10-CM | POA: Diagnosis not present

## 2017-11-26 LAB — CBC
HCT: 38.7 % (ref 36.0–46.0)
Hemoglobin: 12.5 g/dL (ref 12.0–15.0)
MCH: 35.9 pg — ABNORMAL HIGH (ref 26.0–34.0)
MCHC: 32.3 g/dL (ref 30.0–36.0)
MCV: 111.2 fL — ABNORMAL HIGH (ref 80.0–100.0)
PLATELETS: 133 10*3/uL — AB (ref 150–400)
RBC: 3.48 MIL/uL — ABNORMAL LOW (ref 3.87–5.11)
RDW: 13.6 % (ref 11.5–15.5)
WBC: 4.3 10*3/uL (ref 4.0–10.5)
nRBC: 0 % (ref 0.0–0.2)

## 2017-11-26 LAB — COMPREHENSIVE METABOLIC PANEL
ALK PHOS: 117 U/L (ref 38–126)
ALT: 21 U/L (ref 0–44)
ANION GAP: 9 (ref 5–15)
AST: 45 U/L — ABNORMAL HIGH (ref 15–41)
Albumin: 1.8 g/dL — ABNORMAL LOW (ref 3.5–5.0)
BILIRUBIN TOTAL: 1.7 mg/dL — AB (ref 0.3–1.2)
BUN: 7 mg/dL (ref 6–20)
CALCIUM: 7.8 mg/dL — AB (ref 8.9–10.3)
CO2: 20 mmol/L — ABNORMAL LOW (ref 22–32)
CREATININE: 0.66 mg/dL (ref 0.44–1.00)
Chloride: 105 mmol/L (ref 98–111)
GFR calc Af Amer: >60 mL/min (ref >60)
Glucose, Bld: 97 mg/dL (ref 70–99)
Potassium: 3.7 mmol/L (ref 3.5–5.1)
Sodium: 134 mmol/L — ABNORMAL LOW (ref 135–145)
TOTAL PROTEIN: 6.7 g/dL (ref 6.5–8.1)

## 2017-11-26 LAB — BODY FLUID CELL COUNT WITH DIFFERENTIAL
LYMPHS FL: 24 %
MONOCYTE-MACROPHAGE-SEROUS FLUID: 68 % (ref 50–90)
Neutrophil Count, Fluid: 8 % (ref 0–25)
Total Nucleated Cell Count, Fluid: 104 cu mm (ref 0–1000)

## 2017-11-26 LAB — PROTIME-INR
INR: 1.54
PROTHROMBIN TIME: 18.3 s — AB (ref 11.4–15.2)

## 2017-11-26 MED ORDER — ALBUMIN HUMAN 25 % IV SOLN
50.0000 g | Freq: Once | INTRAVENOUS | Status: AC
  Administered 2017-11-26: 12.5 g via INTRAVENOUS
  Filled 2017-11-26: qty 200

## 2017-11-26 MED ORDER — SODIUM CHLORIDE (PF) 0.9 % IJ SOLN
INTRAMUSCULAR | Status: AC
  Filled 2017-11-26: qty 50

## 2017-11-26 MED ORDER — IOHEXOL 300 MG/ML  SOLN
100.0000 mL | Freq: Once | INTRAMUSCULAR | Status: AC | PRN
  Administered 2017-11-26: 100 mL via INTRAVENOUS

## 2017-11-26 MED ORDER — IOHEXOL 300 MG/ML  SOLN
30.0000 mL | Freq: Once | INTRAMUSCULAR | Status: AC | PRN
  Administered 2017-11-26: 30 mL via ORAL

## 2017-11-26 MED ORDER — SULFAMETHOXAZOLE-TRIMETHOPRIM 800-160 MG PO TABS
1.0000 | ORAL_TABLET | Freq: Every day | ORAL | Status: DC
  Administered 2017-11-27: 1 via ORAL
  Filled 2017-11-26: qty 1

## 2017-11-26 MED ORDER — LIDOCAINE HCL 1 % IJ SOLN
INTRAMUSCULAR | Status: AC
  Filled 2017-11-26: qty 20

## 2017-11-26 MED ORDER — MORPHINE SULFATE (PF) 2 MG/ML IV SOLN
2.0000 mg | Freq: Once | INTRAVENOUS | Status: AC | PRN
  Administered 2017-11-26: 2 mg via INTRAVENOUS
  Filled 2017-11-26: qty 1

## 2017-11-26 NOTE — Progress Notes (Signed)
**Note De-Identified vi Obfusction** PROGRESS NOTE    Linda Vaughn  WUJ:[id number] DOB: September 13, 1965 DOA: 11/25/2017 PCP: Mike Gip, FNP   Brief Nrrtive: Linda Vaughn is  52 y.o. femle with medicl history significnt of cirrhosis secondry to history of lcohol nd heptitis C, history of SBP, hypertension, history of ventrl herni presenting with worsening bdominl pin nd distention over the pst dy or so.   Ptient notes tht her pin strted round lst night.  She notes tht she hs hd  recent chnge in her diuretics nd thinks tht the doses were decresed.  She noted tht she hd worsening bdominl distention nd pin.  She notes this typiclly gets better with  prcentesis.  She notes some decresed ppetite nd nuse due to her bdominl distention.  She denies ny fevers, chest pin, shortness of breth.  She does note some nuse with her distention.  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 She ws recently dischrged on 11/14 fter LVP with 5.7 L of yellow fluid nd ws treted empiriclly for SBP t tht time with cipro t time of dischrge Now s/p prcentesis with 6.7 L scitic fluid removed Giving 50 g lbumin post procedure for LVP Cell count not c/w SBP, follow culture She initilly noted improvement in her pin post procedure, but then complined of worsening epigstric pin.  Will follow CT bdomen pelvis with contrst. Given hx of SBP will strt on bctrim dily  Continue lctulose Increse spironolctone to 100 mg, continue lsix t 40 for now (diuretics will need to continue to be djusted nd incresed s n outptient with ttention to fluid sttus, cretinine, nd electrolytes) Needs to  reestblish with GI loclly - discussed with [facility] re[facility]ding ssistnce in rrnging follow up ppt (she's missed her recent ppts with [facility] nd wnts to follow with someone loclly) Will c/s dieticin for eduction regrding low sodium diet  Pyuri: follow urine cx, positive nitrite.  She seems symptomtic from this, follow.  Hypertension: continue lsix nd spironolctone  Ventrl herni: no significnt TTP, needs continued follow up with outptient providers.  Hd CT t most recent dmission notble for severe scites.  Of note, fluid in ventrl herni noted to be septted (which is chronic).  Follow repet CT scn s noted bove.  Hx Polysubstnce Abuse: encourge continued cesstion  DVT prophylxis: SCD's Code Sttus: full  Fmily Communiction: none t bedside Disposition Pln: pending, initilly plnned on d/c tody, but given pin will follow CT bdomen pelvis  Consultnts:   IR  Procedures:   Kore guided prcentesis  Antimicrobils:  Anti-infectives (From dmission, onwrd)   Strt     Dose/Rte Route Frequency Ordered Stop   11/26/17 2200  sulfmethoxzole-trimethoprim (BACTRIM DS,SEPTRA DS) 800-160 MG per tblet 1 tblet     1 tblet Orl Dily 11/26/17 1803     11/25/17 2200  cefTRIAXone (ROCEPHIN) 2 g in sodium chloride 0.9 % 100 mL IVPB  Sttus:  Discontinued     2 g 200 mL/hr over 30 Minutes Intrvenous Every 24 hours 11/25/17 2142 11/26/17 1803     Subjective: Feeling better fter pr But then described discomfort she notes feels like gs pin. Describes s epigstric in loction.  Objective: Vitls: 11/26/17 1452 11/26/17 1455 11/26/17 1517 11/26/17 1555  BP: (!) 123/95 121/88 (!) 141/90 123/87  Pulse:   74 68  Resp:   18 18  Temp:   97.9 F (36.6 C) 97.7 F (36.5 C)  TempSrc:   Oral Oral  SpO2: 100% 99% 99% 97%  Height:        Intake/Output Summary (Last 24 hours) at 11/26/2017 1802 Last data filed at 11/26/2017 1607 Gross  per 24 hour  Intake 1308.65 ml  Output 250 ml  Net 1058.65 ml   There were no vitals filed for this visit.  Examination:  General exam: ppears calm and uncomofrtable Respiratory system: Clear to auscultation. Respiratory effort normal. Cardiovascular system: S1 & S2 heard, RRR.  Gastrointestinal system: bdomen is distended, but improved, soft and mildly TTP in epigastric region.  Ventral hernias non tender to palpation. Central nervous system: lert and oriented. No focal neurological deficits. Extremities: moving all extremities Skin: No rashes, lesions or ulcers Psychiatry: Judgement and insight appear normal. Mood & affect appropriate.     Data Reviewed: I have personally reviewed following labs and imaging studies  CBC: Recent Labs  Lab 11/25/17 1326 11/26/17 0503  WBC 4.6 4.3  NEUTROBS 1.6*  --   HGB 13.7 12.5  HCT 40.8 38.7  MCV 107.4* 111.2*  PLT 149* 133*   Basic Metabolic Panel: Recent Labs  Lab 11/25/17 1326 11/26/17 0503  N 137 134*  K 4.1 3.7  CL 107 105  CO2 25 20*  GLUCOSE 98 97  BUN 9 7  CRETININE 0.74 0.66  CLCIUM 8.4* 7.8*   GFR: Estimated Creatinine Clearance: 81.6 mL/min (by C-G formula based on SCr of 0.66 mg/dL). Liver Function Tests: Recent Labs  Lab 11/25/17 1326 11/26/17 0503  ST 48* 45*  LT 22 21  LKPHOS 145* 117  BILITOT 2.0* 1.7*  PROT 7.4 6.7  LBUMIN 2.0* 1.8*   Recent Labs  Lab 11/25/17 1326  LIPSE 27   No results for input(s): MMONI in the last 168 hours. Coagulation Profile: Recent Labs  Lab 11/25/17 1326 11/26/17 0503  INR 1.53 1.54   Cardiac Enzymes: No results for input(s): CKTOTL, CKMB, CKMBINDEX, TROPONINI in the last 168 hours. BNP (last 3 results) No results for input(s): PROBNP in the last 8760 hours. Hb1C: No results for input(s): HGB1C in the last 72 hours. CBG: No results for input(s): GLUCP in the last 168 hours. Lipid Profile: No results for input(s): CHOL, HDL, LDLCLC,  TRIG, CHOLHDL, LDLDIRECT in the last 72 hours. Thyroid Function Tests: No results for input(s): TSH, T4TOTL, FREET4, T3FREE, THYROIDB in the last 72 hours. nemia Panel: No results for input(s): VITMINB12, FOLTE, FERRITIN, TIBC, IRON, RETICCTPCT in the last 72 hours. Sepsis Labs: No results for input(s): PROCLCITON, LTICCIDVEN in the last 168 hours.  Recent Results (from the past 240 hour(s))  Urine Culture     Status: bnormal   Collection Time: 11/19/17  4:10 PM  Result Value Ref Range Status   Specimen Description   Final    URINE, CLEN CTCH Performed at [facility], [address] [place], [place] [address]    Special Requests   Final    NONE Performed at [facility], [address] [place], [place] [address]    Culture >=100,000 COLONIES/mL ESCHERICHI COLI ()  Final   Report Status 11/22/2017 FINL  Final   Organism ID, Bacteria ESCHERICHI COLI ()  Final      Susceptibility **Note De-Identified vi Obfusction** Escherichi coli - MIC*    MPICILLIN <=2 SENSITIVE Sensitive     CEFZOLIN <=4 SENSITIVE Sensitive     CEFTRIXONE <=1 SENSITIVE Sensitive     CIPROFLOXCIN <=0.25 SENSITIVE Sensitive     GENTMICIN <=1 SENSITIVE Sensitive     IMIPENEM <=0.25 SENSITIVE Sensitive     NITROFURNTOIN <=16 SENSITIVE Sensitive     TRIMETH/SULF >=320 RESISTNT Resistnt     MPICILLIN/SULBCTM <=2 SENSITIVE Sensitive     PIP/TZO <=4 SENSITIVE Sensitive     Extended ESBL NEGTIVE Sensitive     * >=100,000 COLONIES/mL ESCHERICHI COLI         Rdiology Studies: Kore Prcentesis  Result Dte: 11/26/2017 INDICTION: Ptient with history of lcoholic cirrhosis/heptitis nd recurrent scites. Request is mde for dignostic nd therpeutic prcentesis. EXM: ULTRSOUND GUIDED DIGNOSTIC ND THERPEUTIC PRCENTESIS MEDICTIONS: 10 mL 1% lidocine COMPLICTIONS: None immedite. PROCEDURE: Informed written consent ws obtined from the ptient fter  discussion of  the risks, benefits nd lterntives to tretment.  timeout ws performed prior to the initition of the procedure. Initil ultrsound scnning demonstrtes  lrge mount of scites within the right upper bdominl qudrnt. The right upper bdomen ws prepped nd drped in the usul sterile fshion. 1% lidocine ws used for locl nesthesi. Following this,  19 guge, 7-cm, Yueh ctheter ws introduced. n ultrsound imge ws sved for documenttion purposes. The prcentesis ws performed. The ctheter ws removed nd  dressing ws pplied. The ptient tolerted the procedure well without immedite post procedurl compliction. FINDINGS:  totl of pproximtely 6.7 L of cler yellow fluid ws removed. Smples were sent to the lbortory s requested by the clinicl tem. IMPRESSION: Successful ultrsound-guided prcentesis yielding 6.7 L of peritonel fluid. Red by: Elwin Moch, P-C Electroniclly Signed   By: Irish Lck M.D.   On: 11/26/2017 15:09        Scheduled Meds: . feeding supplement (ENSURE ENLIVE)  237 mL Orl Q24H  . folic cid  1 mg Orl Dily  . furosemide  40 mg Orl Dily  . lctulose  30 g Orl BID  . lidocine      . multivitmin with minerls  1 tblet Orl Dily  . polyethylene glycol  17 g Orl Dily  . sodium chloride flush  3 mL Intrvenous Q12H  . spironolctone  100 mg Orl Dily   Continuous Infusions: . cefTRIXone (ROCEPHIN)  IV Stopped (11/25/17 2306)     LOS: 0 dys    Time spent: over 30 min    Lcreti Nicks, MD [facility] [phone]  If 7PM-7M, plese contct night-coverge [email or web address] [employer] [facility] 11/26/2017, 6:02 PM
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
## 2017-11-26 NOTE — Progress Notes (Signed)
Noted 2 bottles of albumin at bedside-unopened.  Appears to be a portion of earlier in the day order.  Infused at this time

## 2017-11-26 NOTE — Plan of Care (Signed)
Nutrition Education Note  RD received consult for pt regarding cirrhosis education.  Pt was given "Low Sodium Nutrition Therapy" handout from the [employer]. Pt was educated today on limiting sodium from the diet. Pt was provided with examples of high sodium foods and provided with names of salt-free alternatives. Pt was also educated on the importance of regular weights and limiting fluid from the diet if advised by MD. Pt was educated on the importance of eating adequate amounts of lean proteins to preserve lean muscle mass and to meet increased estimated needs given pt's current condition. Pt was given examples of healthy proteins and advised on possible supplements.    RD discussed why it is important for patient to adhere to diet recommendations, and emphasized the role of fluids, foods to avoid, and importance of weighing self daily. Teach back method used.  Pt states she "loves salt." She uses salt to season almost every meal but recently has tried Mrs. Dash. Encouraged pt is season food with herbs and spices that do not contain sodium. Discussed why it is important to be in compliance with low sodium recommendations.   Expect fair compliance.  Body mass index is 39.84 kg/m. Pt meets criteria for obese based on current BMI.  Current diet order is low sodium, patient is consuming approximately 100% of meals at this time. Labs and medications reviewed. No further nutrition interventions warranted at this time. RD contact information provided. If additional nutrition issues arise, please re-consult RD.   Vanessa Kickarly Advith Martine RD, LDN Clinical Nutrition Pager # [phone]

## 2017-11-26 NOTE — Procedures (Signed)
PROCEDURE SUMMARY:  Successful image-guided paracentesis from the right upper abdomen.  Yielded 6.7 liters of clear yellow fluid.  No immediate complications.  Patient tolerated well.   Specimen was sent for labs.  Gordy Councilmanlexandra Zabrina Brotherton PA-C 11/26/2017 3:04 PM

## 2017-11-27 DIAGNOSIS — R188 Other ascites: Secondary | ICD-10-CM | POA: Diagnosis not present

## 2017-11-27 LAB — CBC
HEMATOCRIT: 36.5 % (ref 36.0–46.0)
Hemoglobin: 12.1 g/dL (ref 12.0–15.0)
MCH: 34.7 pg — AB (ref 26.0–34.0)
MCHC: 33.2 g/dL (ref 30.0–36.0)
MCV: 104.6 fL — ABNORMAL HIGH (ref 80.0–100.0)
Platelets: 130 10*3/uL — ABNORMAL LOW (ref 150–400)
RBC: 3.49 MIL/uL — AB (ref 3.87–5.11)
RDW: 13.2 % (ref 11.5–15.5)
WBC: 4.4 10*3/uL (ref 4.0–10.5)
nRBC: 0 % (ref 0.0–0.2)

## 2017-11-27 LAB — GRAM STAIN

## 2017-11-27 LAB — COMPREHENSIVE METABOLIC PANEL
ALBUMIN: 2.4 g/dL — AB (ref 3.5–5.0)
ALK PHOS: 126 U/L (ref 38–126)
ALT: 20 U/L (ref 0–44)
ANION GAP: 6 (ref 5–15)
AST: 44 U/L — ABNORMAL HIGH (ref 15–41)
BILIRUBIN TOTAL: 1.8 mg/dL — AB (ref 0.3–1.2)
BUN: 7 mg/dL (ref 6–20)
CO2: 21 mmol/L — AB (ref 22–32)
CREATININE: 0.79 mg/dL (ref 0.44–1.00)
Calcium: 7.9 mg/dL — ABNORMAL LOW (ref 8.9–10.3)
Chloride: 106 mmol/L (ref 98–111)
GFR calc Af Amer: >60 mL/min (ref >60)
GFR calc non Af Amer: >60 mL/min (ref >60)
GLUCOSE: 105 mg/dL — AB (ref 70–99)
Potassium: 3.5 mmol/L (ref 3.5–5.1)
SODIUM: 133 mmol/L — AB (ref 135–145)
TOTAL PROTEIN: 6.4 g/dL — AB (ref 6.5–8.1)

## 2017-11-27 LAB — MAGNESIUM: Magnesium: 1.8 mg/dL (ref 1.7–2.4)

## 2017-11-27 LAB — PROTIME-INR
INR: 1.69
Prothrombin Time: 19.6 seconds — ABNORMAL HIGH (ref 11.4–15.2)

## 2017-11-27 LAB — PROTEIN, PLEURAL OR PERITONEAL FLUID: Total protein, fluid: <3 g/dL

## 2017-11-27 LAB — LIPASE, BLOOD: Lipase: 24 U/L (ref 11–51)

## 2017-11-27 MED ORDER — METOCLOPRAMIDE HCL 5 MG/ML IJ SOLN
5.0000 mg | Freq: Once | INTRAMUSCULAR | Status: AC
  Administered 2017-11-27: 5 mg via INTRAVENOUS
  Filled 2017-11-27: qty 2

## 2017-11-27 MED ORDER — SULFAMETHOXAZOLE-TRIMETHOPRIM 800-160 MG PO TABS: 1.0000 | ORAL_TABLET | Freq: Every day | ORAL | 0 refills | Status: AC

## 2017-11-27 NOTE — Progress Notes (Signed)
Pt alert and oriented, tolerating diet.  D/C instructions were given as well as a bus pass. Pt d/cd to home.

## 2017-11-27 NOTE — Discharge Summary (Signed)
**Note De-Identified vi Obfusction** Physicin Dischrge [facility]  Linda Vaughn ZOX:[id number] DOB: Februry 18, 1967 DOA: 11/25/2017  PCP: Mike Gip, FNP  Admit dte: 11/25/2017 Dischrge dte: 11/27/2017  Time spent: 35 minutes  Recommendtions for Outptient Follow-up:  1. Follow up outptient CBC/CMP 2. Follow up with GI s outptient.  Discussed with [facility]  Who noted they would try to rrnge outptient follow up for her.  If outptient prcenteses could be rrnged I think this would be helpful. 3. Follow up diuretic dosing nd volume sttus.  Diuretics need to be grdully uptitrted s tolerted.  4. Follow up with surgery for herni s outptient 5. Follow up mood s outptient.  Pt noted mood ws down, denied SI.  Discussed need to follow up with PCP (s well s stopping illicit substnces).   6. 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   Ptient notes tht her pin strted round lst night. She notes tht she hs hd  recent chnge in her diuretics nd thinks tht the doses were decresed. She noted tht she hd worsening bdominl distention nd pin. She notes this typiclly gets better with  prcentesis. She notes some decresed ppetite nd nuse due to her bdominl distention. She denies ny fevers, chest pin, shortness of breth. She does note some nuse with her distention. She dmits to some mrijun use nd smoking, but not denies ny recent lcohol use  Hospitl course She ws dmitted for tense scites  nd bdominl pin.  Her pin improved fter prcentesis initilly, then she complined of worsening epigstric "gs like pin" which she'd never hd before.  CT bdomen/pelvis ws unreveling.  Discussed with surgery on cll given her hernis nd they noted they did not see nything concerning on imging.  She grdully improved nd ws dischrged on with plns for outptient GI follow up, uptitrtion of diuretics, nd initition of SBP ppx.   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 She ws recently dischrged on 11/14 fter LVP with 5.7 L of yellow fluid nd ws treted empiriclly for SBP t tht time with cipro t time of dischrge Now s/p prcentesis with 6.7 L scitic fluid removed Giving 50 g lbumin post procedure for LVP Cell count not c/w SBP, follow culture She initilly noted improvement in her pin post procedure, but then complined of worsening epigstric pin -> CT bdomen pelvis with decresed scites, but without bnormlities to explin her pin (discussed with surgery s well given her ventrl herni nd they did not see ny concerning findings).  Given hx of SBP will strt on bctrim dily  Continue lctulose Increse spironolctone to 100 mg, continue lsix t 40 for now (diuretics will need to continue to be djusted nd incresed s n outptient with ttention to fluid sttus, cretinine, nd electrolytes - of note, med rec sys 40 BID, but she ws instructed to tke 40 mg dily until f/u with **Note De-Identified vi Obfusction** PCP) Needs to reestblish with GI loclly - discussed with [facility] regrding ssistnce in rrnging follow up ppt (she's missed her recent ppts with [facility] nd wnts to follow with someone loclly) - they noted they'll try to rrnge follow up with her.  Outptient prcenteses my be helpful for  her.  Will c/s dieticin for eduction regrding low sodium diet  Asymptomtic Bcteruri: pt symptomtic, tret only if sx  Hypertension: continue lsix nd spironolctone  Ventrl herni: Repet CT s noted bove.  Follow up with outptient provider.  Depressed Mood  Hx Polysubstnce Abuse: encourged cesstion of MJ.  She notes depressed mood tody, but no SI.  Initilly considering d/c with SSRI, but s she's still using MJ occsionlly, encourged to d/c use nd follow up with PCP.   Procedures: Kore guided prcentesis 11/21  Consulttions:  IR  Surgery over phone  Dischrge Exm: Vitls:   11/27/17 0529 11/27/17 1357  BP: 103/66 121/77  Pulse: 95 79  Resp: 18 18  Temp: 98.7 F (37.1 C) 98.8 F (37.1 C)  SpO2: 96% 95%   Terful this morning with pin. Lter in fternoon, seemed comfortble. Expressed "depressed mood".  Due to scites, herni. Encourged her tht follow up is extremely importnt to find right dose of diuretic  Generl: No cute distress. Crdiovsculr: Hert sounds show  regulr rte, nd rhythm Lungs: Cler to usculttion bilterlly  Abdomen: Soft, distended, diffusely mildly ttp ventrl herni. Neurologicl: Alert nd oriented 3. Moves ll extremities 4. Crnil nerves II through XII grossly intct. Skin: Wrm nd dry. No rshes or lesions. Extremities: No clubbing or cynosis.   Dischrge Instructions   Dischrge Instructions    Cll MD for:  difficulty brething, hedche or visul disturbnces   Complete by:  As directed    Cll MD for:  persistnt dizziness or light-hededness   Complete by:  As directed    Cll MD for:  persistnt nuse nd vomiting   Complete by:  As directed    Cll MD for:  redness, tenderness, or signs of infection (pin, swelling, redness, odor or green/yellow dischrge round incision site)   Complete by:  As directed    Cll MD for:  severe uncontrolled pin   Complete by:  As directed    Cll MD  for:  temperture >100.4   Complete by:  As directed    Diet - low sodium hert helthy   Complete by:  As directed    Diet - low sodium hert helthy   Complete by:  As directed    Dischrge instructions   Complete by:  As directed    You were seen for bdominl discomfort due to your scites.  This improved fter prcentesis.  You did not hve ny evidence of SBP.  We will strt you on  medicine to help prevent SBP (bctrim).  Plese rrnge follow up with your PCP for your lsix nd spironlctone.  These will need to be grdully incresed s tolerted with close ttention to your kidneys nd electrolytes.  This will help with your scites.  Also,  low sodium diet is extremely importnt to help with control of the scites.  [facility] should cll you to schedule n ppointment soon.  If you don't her from them, plese cll for  follow up ppointment.  You hd evidence of  UTI, but no symptoms.  If you develop symptoms of  UTI, plese cll your PCP.  Plese void mrijun nd other drugs.  Plese follow **Note De-Identified vi Obfusction** up with your PCP for your mood.  Return for new, recurrent, or worsening symptoms.  Plese sk your PCP to request records from this hospitliztion so they know wht ws done nd wht the next steps will be.   Increse ctivity slowly   Complete by:  s directed    Increse ctivity slowly   Complete by:  s directed      llergies s of 11/27/2017      Rections   spirin Other (See Comments)   Liver dmge    Ibuprofen Other (See Comments)   Liver dmge   Tylenol [cetminophen] Other (See Comments)   Liver dmge      Mediction List    TKE these medictions   feeding supplement (ENSURE ENLIVE) Liqd Tke 237 mLs by mouth dily.   folic cid 1 MG tblet Commonly known s:  FOLVITE Tke 1 tblet (1 mg totl) by mouth dily.   furosemide 40 MG tblet Commonly known s:  LSIX Tke 1 tblet (40 mg totl) by mouth 2 (two) times dily.    lctulose 10 GM/15ML solution Commonly known s:  CHRONULC Tke 45 mLs (30 g totl) by mouth 2 (two) times dily.   multivitmin with minerls Tbs tblet Tke 1 tblet by mouth dily.   spironolctone 25 MG tblet Commonly known s:  LDCTONE Tke 4 tblets (100 mg totl) by mouth dily.   sulfmethoxzole-trimethoprim 800-160 MG tblet Commonly known s:  BCTRIM DS,SEPTR DS Tke 1 tblet by mouth dily. For SBP prophylxis      llergies  llergen Rections  . spirin Other (See Comments)    Liver dmge   . Ibuprofen Other (See Comments)    Liver dmge  . Tylenol [cetminophen] Other (See Comments)    Liver dmge   Follow-up Informtion    Mike Gip, FNP Follow up.   Specilty:  [facility] Contct informtion: [address] [place] [address] [phone]        [facility] Follow up.   Specilty:  [facility] Why:  Plese cll bout  follow up ppointment Contct informtion: [address] [place] [address] [phone]           The results of significnt dignostics from this hospitliztion (including imging, microbiology, ncillry nd lbortory) re listed below for reference.    Significnt Dignostic Studies: Ct bdomen Pelvis Wo Contrst  Result Dte: 11/19/2017 CLINICL DT:  Incresed bdominl distension nd shortness of breth. Evlute ventrl herni nd scites. History of cirrhosis. EXM: CT BDOMEN ND PELVIS WITHOUT CONTRST TECHNIQUE: Multidetector CT imging of the bdomen nd pelvis ws performed following the stndrd protocol without IV contrst. COMPRISON:  My 21, 2019 FINDINGS: Lower chest: There is  smll to moderte right pleurl effusion with ssocited telectsis. No other cute bnormlities in the lower chest. Heptobiliry:  shrunken nodulr liver is consistent with cirrhosis. Previous cholecystectomy. Pncres: Unremrkble. No pncretic ductl  dilttion or surrounding inflmmtory chnges. Spleen: Norml in size without focl bnormlity. drenls/Urinry Trct: drenl glnds re unremrkble. Kidneys re norml, without renl clculi, focl lesion, or hydronephrosis. Bldder is unremrkble. Stomch/Bowel: The stomch, smll bowel, nd colon re norml. The ppendix is not seen but there is no secondry evidence of ppendicitis. Vsculr/Lymphtic: therosclerosis in the nonneurysml ort. No gross denopthy. Reproductive: Uterus nd bilterl dnex re unremrkble. Other: Severe scites, worsened in the intervl. scites extends through the ventrl herni. The fluid in the ventrl herni ppers septted. The fluid in the ventrl herni mesures 10.3 **Note De-Identified vi Obfusction** by 12.7 cm tody versus 5.6 x 11.9 cm previously. Incresed ttenution in the subcutneous ft consistent with volume overlod, worse in the intervl. Musculoskeletl: No cute or significnt osseous findings. IMPRESSION: 1. Severe scites, worsened in the intervl. The fluid in the ventrl herni is septted nd lrger in the intervl s well. 2. Smll to moderte right pleurl effusion with telectsis, unchnged. 3. Cirrhotic liver. 4. therosclerotic chnge in the ort. Electroniclly Signed   By: Gerome Smvid  Willims III M.D   On: 11/19/2017 00:36   Ct bdomen Pelvis W Contrst  Result Dte: 11/26/2017 CLINICL DT:  Epigstric bdominl pin nd distention. Prcentesis tody. EXM: CT BDOMEN ND PELVIS WITH CONTRST TECHNIQUE: Multidetector CT imging of the bdomen nd pelvis ws performed using the stndrd protocol following bolus dministrtion of intrvenous contrst. CONTRST:  100mL OMNIPQUE IOHEXOL 300 MG/ML SOLN, 30mL OMNIPQUE IOHEXOL 300 MG/ML SOLN COMPRISON:  November 19, 2017 FINDINGS: Lower chest: There is  smll to moderte right pleurl effusion with ssocited telectsis, unchnged. The left lung is cler. No other bnormlities in the lower chest. Heptobiliry: The  nodulr contour of the liver is consistent with cirrhosis. The umbilicl vein hs recnnulted consistent with portl venous hypertension. Ptient is sttus post cholecystectomy. No liver msses noted. The portl vein remins ptent. Pncres: Unremrkble. No pncretic ductl dilttion or surrounding inflmmtory chnges. Spleen: Norml in size without focl bnormlity. drenls/Urinry Trct: drenl glnds re unremrkble. Kidneys re norml, without renl clculi, focl lesion, or hydronephrosis. Bldder is unremrkble. Stomch/Bowel: The stomch, smll bowel, nd colon re unremrkble. The ppendix is not visulized. Vsculr/Lymphtic: No significnt vsculr findings re present. No enlrged bdominl or pelvic lymph nodes. Reproductive: Uterus nd bilterl dnex re unremrkble. Other: No free ir. scites remins but is significntly decresed in the intervl. The septted fluid collection in the nterior bdominl wll, ssocited with  ventrl herni, mesures 12.3 x 7.9 cm tody versus 12.8 x 10.3 cm previously, lso smller. Musculoskeletl: No cute or significnt osseous findings. IMPRESSION: 1. Persistent but significntly decresed scites. The nterior bdominl wll septted fluid collection is lso decresed in size but persists.  smll to moderte right pleurl effusion with ssocited telectsis is stble s well. 2. Cirrhotic liver. Umbilicl vein recnultion consistent with portl venous hypertension. 3. No other bnormlities. 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 Request is mde for dignostic nd therpeutic prcentesis. EXM: ULTRSOUND GUIDED DIGNOSTIC ND THERPEUTIC PRCENTESIS MEDICTIONS: 10 mL 1% lidocine COMPLICTIONS: None immedite. PROCEDURE: Informed written consent ws obtined from the  ptient fter  discussion of the risks, benefits nd lterntives to tretment.  timeout ws performed prior to the initition of the procedure. Initil ultrsound scnning demonstrtes  lrge mount of scites within the right upper bdominl qudrnt. The right upper bdomen ws prepped nd drped in the usul sterile fshion. 1% lidocine ws used for locl nesthesi. Following this,  19 guge, 7-cm, Yueh ctheter ws introduced. n ultrsound imge ws sved for documenttion purposes. The prcentesis ws performed. The ctheter ws removed nd  dressing ws pplied. The ptient tolerted the procedure well without immedite post procedurl compliction. FINDINGS:  totl of pproximtely 6.7 L of cler yellow fluid ws removed. Smples were sent to the lbortory s requested by the clinicl tem. IMPRESSION: Successful ultrsound-guided prcentesis yielding 6.7 L of peritonel fluid. Red by: Elwin Mochlexndr Louk, P-C Electroniclly Signed   By: Sherrine MplesGlenn **Note De-Identified vi Obfusction** Fredi Sorrow M.D.   On: 11/26/2017 15:09   [place] Prcentesis  Result Dte: 11/19/2017 INDICTION: Ptient with history of heptitis-C, cirrhosis, recurrent scites. Request mde for therpeutic prcentesis. EXM: ULTRSOUND GUIDED THERPEUTIC PRCENTESIS MEDICTIONS: None COMPLICTIONS: None immedite. PROCEDURE: Informed written consent ws obtined from the ptient fter  discussion of the risks, benefits nd lterntives to tretment.  timeout ws performed prior to the initition of the procedure. Initil ultrsound scnning demonstrtes  lrge mount of scites within the left mid to lower bdominl qudrnt. The left mid to lower bdomen ws prepped nd drped in the usul sterile fshion. 1% lidocine ws used for locl nesthesi. Following this,  19 guge, 10-cm, Yueh ctheter ws introduced. n ultrsound imge ws sved for documenttion purposes. The prcentesis ws performed. The ctheter ws removed nd  dressing ws pplied. The ptient  tolerted the procedure well without immedite post procedurl compliction. FINDINGS:  totl of pproximtely 5.7 liters of yellow fluid ws removed. IMPRESSION: Successful ultrsound-guided therpeutic prcentesis yielding 5.7 liters of peritonel fluid. Red by: Jennne Rm, P-C Electroniclly Signed   By: Simonne Come M.D.   On: 11/19/2017 15:48   [place] Prcentesis  Result Dte: 11/10/2017 INDICTION: History of lcoholic cirrhosis/heptitis nd recurrent scites. Request for dignostic nd therpeutic prcentesis. EXM: ULTRSOUND GUIDED RIGHT LOWER QUDRNT PRCENTESIS MEDICTIONS: None. COMPLICTIONS: None immedite. PROCEDURE: Informed written consent ws obtined from the ptient fter  discussion of the risks, benefits nd lterntives to tretment.  timeout ws performed prior to the initition of the procedure. Initil ultrsound scnning demonstrtes  lrge mount of scites within the right lower bdominl qudrnt. The right lower bdomen ws prepped nd drped in the usul sterile fshion. 1% lidocine with epinephrine ws used for locl nesthesi. Following this,  19 guge, 10-cm, Yueh ctheter ws introduced. n ultrsound imge ws sved for documenttion purposes. The prcentesis ws performed. The ctheter ws removed nd  dressing ws pplied. The ptient tolerted the procedure well without immedite post procedurl compliction. FINDINGS:  totl of pproximtely 6.7 L of cler yellow fluid ws removed. Smples were sent to the lbortory s requested by the clinicl tem. IMPRESSION: Successful ultrsound-guided prcentesis yielding 6.7 liters of peritonel fluid. Red by: Bryton El P-C Electroniclly Signed   By: Joline Click M.D.   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 Request is mde for dignostic nd therpeutic prcentesis. EXM: ULTRSOUND GUIDED  DIGNOSTIC ND THERPEUTIC PRCENTESIS MEDICTIONS: 10 mL 1% lidocine COMPLICTIONS: None immedite. PROCEDURE: Informed written consent ws obtined from the ptient fter  discussion of the risks, benefits nd lterntives to tretment.  timeout ws performed prior to the initition of the procedure. Initil ultrsound scnning demonstrtes  lrge mount of scites within the right lower bdominl qudrnt. The right lower bdomen ws prepped nd drped in the usul sterile fshion. 1% lidocine ws used for locl nesthesi. Following this,  19 guge, 7-cm, Yueh ctheter ws introduced. n ultrsound imge ws sved for documenttion purposes. The prcentesis ws performed. The ctheter ws removed nd  dressing ws pplied. The ptient tolerted the procedure well without immedite post procedurl compliction. FINDINGS:  totl of pproximtely 7 L of cler yellow fluid ws removed. Smples were sent to the lbortory s requested by the clinicl tem. IMPRESSION: Successful ultrsound-guided prcentesis yielding 7 L of peritonel fluid. Red by: Elwin Moch, P-C Electroniclly Signed   By: Mlchy Mon M.D.   On: 10/29/2017 17:18    Microbiology:  Recent Results (from the past 240 hour(s))  Urine Culture     Status: bnormal   Collection Time: 11/19/17  4:10 PM  Result Value Ref Range Status   Specimen Description   Final    URINE, CLEN CTCH Performed at [facility], [address], [place], [place] [address]    Special Requests   Final    NONE Performed at [facility], [address], [place], [place] [address]    Culture >=100,000 COLONIES/mL ESCHERICHI COLI ()  Final   Report Status 11/22/2017 FINL  Final   Organism ID, Bacteria ESCHERICHI COLI ()  Final      Susceptibility   Escherichia coli - MIC*    MPICILLIN <=2 SENSITIVE Sensitive     CEFZOLIN <=4 SENSITIVE Sensitive     CEFTRIXONE <=1 SENSITIVE Sensitive      CIPROFLOXCIN <=0.25 SENSITIVE Sensitive     GENTMICIN <=1 SENSITIVE Sensitive     IMIPENEM <=0.25 SENSITIVE Sensitive     NITROFURNTOIN <=16 SENSITIVE Sensitive     TRIMETH/SULF >=320 RESISTNT Resistant     MPICILLIN/SULBCTM <=2 SENSITIVE Sensitive     PIP/TZO <=4 SENSITIVE Sensitive     Extended ESBL NEGTIVE Sensitive     * >=100,000 COLONIES/mL ESCHERICHI COLI  Culture, Urine     Status: bnormal (Preliminary result)   Collection Time: 11/25/17  8:24 PM  Result Value Ref Range Status   Specimen Description   Final    URINE, RNDOM Performed at [facility], [address], [place], [place] [address]    Special Requests   Final    NONE Performed at [facility], [address], [place], [place] [address]    Culture >=100,000 COLONIES/mL ESCHERICHI COLI ()  Final   Report Status PENDING  Incomplete  Culture, body fluid-bottle     Status: None (Preliminary result)   Collection Time: 11/26/17  2:50 PM  Result Value Ref Range Status   Specimen Description PERITONEL  Final   Special Requests NONE  Final   Culture   Final    NO GROWTH < 24 HOURS Performed at [facility], [address], [place], [place] [address]    Report Status PENDING  Incomplete  Gram stain     Status: None   Collection Time: 11/26/17  2:50 PM  Result Value Ref Range Status   Specimen Description PERITONEL  Final   Special Requests NONE  Final   Gram Stain   Final    WBC PRESENT, PREDOMINNTLY MONONUCLER NO ORGNISMS SEEN CYTOSPIN SMER Performed at [facility], [address], [place], [place] [address]    Report Status 11/27/2017 FINL  Final     Labs: Basic Metabolic Panel: Recent Labs  Lab 11/25/17 1326 11/26/17 0503 11/27/17 0403  N 137 134* 133*  K 4.1 3.7 3.5  CL 107 105 106  CO2 25 20* 21*  GLUCOSE 98 97 105*  BUN 9 7 7   CRETININE 0.74 0.66 0.79  CLCIUM 8.4* 7.8* 7.9*  MG  --   --  1.8   Liver Function  Tests: Recent Labs  Lab 11/25/17 1326 11/26/17 0503 11/27/17 0403  ST 48* 45* 44*  LT 22 21 20   LKPHOS 145* 117 126  BILITOT 2.0* 1.7* 1.8*  PROT 7.4 6.7 6.4*  LBUMIN 2.0* 1.8* 2.4*   Recent Labs  Lab 11/25/17 1326 11/27/17 0403  LIPSE 27 24   No results for input(s): MMONI in  the last 168 hours. CBC: Recent Labs  Lab 11/25/17 1326 11/26/17 0503 11/27/17 0403  WBC 4.6 4.3 4.4  NEUTROABS 1.6*  --   --   HGB 13.7 12.5 12.1  HCT 40.8 38.7 36.5  MCV 107.4* 111.2* 104.6*  PLT 149* 133* 130*   Cardiac Enzymes: No results for input(s): CKTOTAL, CKMB, CKMBINDEX, TROPONINI in the last 168 hours. BNP: BNP (last 3 results) No results for input(s): BNP in the last 8760 hours.  ProBNP (last 3 results) No results for input(s): PROBNP in the last 8760 hours.  CBG: No results for input(s): GLUCAP in the last 168 hours.     Signed:  Lacretia Nicks MD.  [facility] 11/27/2017, 5:04 PM

## 2017-11-28 LAB — URINE CULTURE: Culture: >=100000 — AB

## 2017-11-30 LAB — PATHOLOGIST SMEAR REVIEW

## 2017-12-01 LAB — CULTURE, BODY FLUID-BOTTLE: CULTURE: NO GROWTH

## 2017-12-01 LAB — CULTURE, BODY FLUID W GRAM STAIN -BOTTLE

## 2017-12-07 ENCOUNTER — Ambulatory Visit: Payer: Medicaid Other | Admitting: Family Medicine

## 2017-12-13 ENCOUNTER — Other Ambulatory Visit: Payer: Self-pay

## 2017-12-13 ENCOUNTER — Emergency Department (HOSPITAL_COMMUNITY)
Admission: EM | Admit: 2017-12-13 | Discharge: 2017-12-13 | Disposition: A | Discharge status: Home / Self Care | Payer: Medicaid Other | Attending: Emergency Medicine | Admitting: Emergency Medicine

## 2017-12-13 ENCOUNTER — Emergency Department (HOSPITAL_COMMUNITY): Payer: Medicaid Other

## 2017-12-13 DIAGNOSIS — K7031 Alcoholic cirrhosis of liver with ascites: Secondary | ICD-10-CM | POA: Diagnosis not present

## 2017-12-13 DIAGNOSIS — I10 Essential (primary) hypertension: Secondary | ICD-10-CM | POA: Diagnosis not present

## 2017-12-13 DIAGNOSIS — Z79899 Other long term (current) drug therapy: Secondary | ICD-10-CM | POA: Insufficient documentation

## 2017-12-13 DIAGNOSIS — R1084 Generalized abdominal pain: Secondary | ICD-10-CM

## 2017-12-13 DIAGNOSIS — K439 Ventral hernia without obstruction or gangrene: Secondary | ICD-10-CM | POA: Diagnosis not present

## 2017-12-13 DIAGNOSIS — F17200 Nicotine dependence, unspecified, uncomplicated: Secondary | ICD-10-CM | POA: Diagnosis not present

## 2017-12-13 LAB — COMPREHENSIVE METABOLIC PANEL
ALT: 23 U/L (ref 0–44)
AST: 55 U/L — ABNORMAL HIGH (ref 15–41)
Albumin: 2.1 g/dL — ABNORMAL LOW (ref 3.5–5.0)
Alkaline Phosphatase: 128 U/L — ABNORMAL HIGH (ref 38–126)
Anion gap: 6 (ref 5–15)
BILIRUBIN TOTAL: 1.8 mg/dL — AB (ref 0.3–1.2)
BUN: 12 mg/dL (ref 6–20)
CO2: 25 mmol/L (ref 22–32)
Calcium: 8 mg/dL — ABNORMAL LOW (ref 8.9–10.3)
Chloride: 105 mmol/L (ref 98–111)
Creatinine, Ser: 0.82 mg/dL (ref 0.44–1.00)
GFR calc non Af Amer: >60 mL/min (ref >60)
Glucose, Bld: 119 mg/dL — ABNORMAL HIGH (ref 70–99)
Potassium: 3.1 mmol/L — ABNORMAL LOW (ref 3.5–5.1)
Sodium: 136 mmol/L (ref 135–145)
TOTAL PROTEIN: 7 g/dL (ref 6.5–8.1)

## 2017-12-13 LAB — CBC
HCT: 38.1 % (ref 36.0–46.0)
Hemoglobin: 13 g/dL (ref 12.0–15.0)
MCH: 36.1 pg — ABNORMAL HIGH (ref 26.0–34.0)
MCHC: 34.1 g/dL (ref 30.0–36.0)
MCV: 105.8 fL — ABNORMAL HIGH (ref 80.0–100.0)
Platelets: 139 10*3/uL — ABNORMAL LOW (ref 150–400)
RBC: 3.6 MIL/uL — ABNORMAL LOW (ref 3.87–5.11)
RDW: 14.5 % (ref 11.5–15.5)
WBC: 4.9 10*3/uL (ref 4.0–10.5)
nRBC: 0 % (ref 0.0–0.2)

## 2017-12-13 LAB — GRAM STAIN

## 2017-12-13 LAB — BODY FLUID CELL COUNT WITH DIFFERENTIAL
Eos, Fluid: 0 %
Lymphs, Fluid: 26 %
Monocyte-Macrophage-Serous Fluid: 70 % (ref 50–90)
Neutrophil Count, Fluid: 4 % (ref 0–25)
Total Nucleated Cell Count, Fluid: 85 cu mm (ref 0–1000)

## 2017-12-13 LAB — I-STAT BETA HCG BLOOD, ED (MC, WL, AP ONLY): I-stat hCG, quantitative: <5 m[IU]/mL (ref <5)

## 2017-12-13 LAB — LIPASE, BLOOD: Lipase: 24 U/L (ref 11–51)

## 2017-12-13 MED ORDER — TRAMADOL HCL 50 MG PO TABS
50.0000 mg | ORAL_TABLET | Freq: Once | ORAL | Status: AC
  Administered 2017-12-13: 50 mg via ORAL
  Filled 2017-12-13: qty 1

## 2017-12-13 MED ORDER — LIDOCAINE HCL 1 % IJ SOLN
INTRAMUSCULAR | Status: AC
  Filled 2017-12-13: qty 10

## 2017-12-13 MED ORDER — MORPHINE SULFATE (PF) 4 MG/ML IV SOLN
4.0000 mg | Freq: Once | INTRAVENOUS | Status: AC
  Administered 2017-12-13: 4 mg via INTRAVENOUS
  Filled 2017-12-13: qty 1

## 2017-12-13 NOTE — ED Provider Notes (Signed)
Medical screening examination/treatment/procedure(s) were conducted as a shared visit with non-physician practitioner(s) and myself.  I personally evaluated the patient during the encounter.  None 52 year old female presents with worsening abdominal discomfort as well as hernias.  Abdomen is distended and her last paracentesis was about a month ago.  Concern for possible obstruction.  Will perform abdominal CT and likely paracentesis via IR low suspicion for peritonitis   Lorre NickAllen, Chloee Tena, MD 12/13/17 [phone]

## 2017-12-13 NOTE — ED Notes (Signed)
Patient transported to US 

## 2017-12-13 NOTE — ED Triage Notes (Signed)
Pt from home called EMS for abd pain onset several months d/t inguinal hernia abd distended with ridgidity guarding on palpation hx abd surgeries

## 2017-12-13 NOTE — Procedures (Signed)
PROCEDURE SUMMARY:  Successful image-guided paracentesis from the right lower abdomen.  Yielded 7.0 liters of clear fluid.  No immediate complications.  Patient tolerated well.   Specimen was sent for labs.  Please see imaging section of Epic for full dictation.  Villa HerbShannon A Burke Terry PA-C 12/13/2017 1:53 PM

## 2017-12-13 NOTE — Discharge Instructions (Addendum)
Evaluated today for abdominal pain.  You had 7 L of fluid pulled off your stomach. The analysis of the fluid did not show infection. Your lab work was at your baseline. Please follow up with your PCP for re-evaluation.  Return to the ED for any new or worsening symptoms.
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
## 2017-12-14 LAB — PATHOLOGIST SMEAR REVIEW

## 2017-12-18 LAB — CULTURE, BODY FLUID W GRAM STAIN -BOTTLE

## 2017-12-18 LAB — CULTURE, BODY FLUID-BOTTLE: CULTURE: NO GROWTH

## 2017-12-27 ENCOUNTER — Other Ambulatory Visit: Payer: Self-pay

## 2017-12-27 ENCOUNTER — Emergency Department (HOSPITAL_COMMUNITY): Payer: Medicaid Other

## 2017-12-27 ENCOUNTER — Emergency Department (HOSPITAL_COMMUNITY)
Admission: EM | Admit: 2017-12-27 | Discharge: 2017-12-27 | Disposition: A | Discharge status: Home / Self Care | Payer: Medicaid Other | Attending: Emergency Medicine | Admitting: Emergency Medicine

## 2017-12-27 ENCOUNTER — Encounter (HOSPITAL_COMMUNITY): Payer: Self-pay

## 2017-12-27 DIAGNOSIS — F17200 Nicotine dependence, unspecified, uncomplicated: Secondary | ICD-10-CM | POA: Diagnosis not present

## 2017-12-27 DIAGNOSIS — E876 Hypokalemia: Secondary | ICD-10-CM | POA: Insufficient documentation

## 2017-12-27 DIAGNOSIS — K429 Umbilical hernia without obstruction or gangrene: Secondary | ICD-10-CM | POA: Diagnosis not present

## 2017-12-27 DIAGNOSIS — R112 Nausea with vomiting, unspecified: Secondary | ICD-10-CM | POA: Insufficient documentation

## 2017-12-27 DIAGNOSIS — R140 Abdominal distension (gaseous): Secondary | ICD-10-CM

## 2017-12-27 DIAGNOSIS — B1710 Acute hepatitis C without hepatic coma: Secondary | ICD-10-CM | POA: Insufficient documentation

## 2017-12-27 DIAGNOSIS — I10 Essential (primary) hypertension: Secondary | ICD-10-CM | POA: Diagnosis not present

## 2017-12-27 DIAGNOSIS — K7460 Unspecified cirrhosis of liver: Secondary | ICD-10-CM

## 2017-12-27 DIAGNOSIS — K7031 Alcoholic cirrhosis of liver with ascites: Secondary | ICD-10-CM | POA: Insufficient documentation

## 2017-12-27 DIAGNOSIS — Z79899 Other long term (current) drug therapy: Secondary | ICD-10-CM | POA: Diagnosis not present

## 2017-12-27 DIAGNOSIS — R188 Other ascites: Secondary | ICD-10-CM

## 2017-12-27 LAB — BODY FLUID CELL COUNT WITH DIFFERENTIAL
EOS FL: 0 %
Lymphs, Fluid: 39 %
Monocyte-Macrophage-Serous Fluid: 3 % — ABNORMAL LOW (ref 50–90)
NEUTROPHIL FLUID: 58 % — AB (ref 0–25)
Total Nucleated Cell Count, Fluid: 69 cu mm (ref 0–1000)

## 2017-12-27 LAB — I-STAT CHEM 8, ED
BUN: 7 mg/dL (ref 6–20)
CALCIUM ION: 1.04 mmol/L — AB (ref 1.15–1.40)
Chloride: 102 mmol/L (ref 98–111)
Creatinine, Ser: 0.8 mg/dL (ref 0.44–1.00)
Glucose, Bld: 87 mg/dL (ref 70–99)
HCT: 36 % (ref 36.0–46.0)
Hemoglobin: 12.2 g/dL (ref 12.0–15.0)
Potassium: 2.7 mmol/L — CL (ref 3.5–5.1)
Sodium: 139 mmol/L (ref 135–145)
TCO2: 28 mmol/L (ref 22–32)

## 2017-12-27 LAB — COMPREHENSIVE METABOLIC PANEL
ALT: 21 U/L (ref 0–44)
AST: 50 U/L — ABNORMAL HIGH (ref 15–41)
Albumin: 1.9 g/dL — ABNORMAL LOW (ref 3.5–5.0)
Alkaline Phosphatase: 118 U/L (ref 38–126)
Anion gap: 8 (ref 5–15)
BUN: 9 mg/dL (ref 6–20)
CO2: 26 mmol/L (ref 22–32)
Calcium: 7.8 mg/dL — ABNORMAL LOW (ref 8.9–10.3)
Chloride: 102 mmol/L (ref 98–111)
Creatinine, Ser: 0.81 mg/dL (ref 0.44–1.00)
GFR calc non Af Amer: >60 mL/min (ref >60)
Glucose, Bld: 89 mg/dL (ref 70–99)
Potassium: 2.8 mmol/L — ABNORMAL LOW (ref 3.5–5.1)
Sodium: 136 mmol/L (ref 135–145)
TOTAL PROTEIN: 6.8 g/dL (ref 6.5–8.1)
Total Bilirubin: 2.6 mg/dL — ABNORMAL HIGH (ref 0.3–1.2)

## 2017-12-27 LAB — AMMONIA: Ammonia: 54 umol/L — ABNORMAL HIGH (ref 9–35)

## 2017-12-27 LAB — CBC WITH DIFFERENTIAL/PLATELET
Abs Immature Granulocytes: 0.01 10*3/uL (ref 0.00–0.07)
Basophils Absolute: 0 10*3/uL (ref 0.0–0.1)
Basophils Relative: 1 %
Eosinophils Absolute: 0.2 10*3/uL (ref 0.0–0.5)
Eosinophils Relative: 4 %
HCT: 36 % (ref 36.0–46.0)
Hemoglobin: 12 g/dL (ref 12.0–15.0)
Immature Granulocytes: 0 %
Lymphocytes Relative: 37 %
Lymphs Abs: 1.6 10*3/uL (ref 0.7–4.0)
MCH: 35.3 pg — ABNORMAL HIGH (ref 26.0–34.0)
MCHC: 33.3 g/dL (ref 30.0–36.0)
MCV: 105.9 fL — ABNORMAL HIGH (ref 80.0–100.0)
MONO ABS: 1 10*3/uL (ref 0.1–1.0)
Monocytes Relative: 23 %
Neutro Abs: 1.4 10*3/uL — ABNORMAL LOW (ref 1.7–7.7)
Neutrophils Relative %: 35 %
Platelets: 126 10*3/uL — ABNORMAL LOW (ref 150–400)
RBC: 3.4 MIL/uL — ABNORMAL LOW (ref 3.87–5.11)
RDW: 15 % (ref 11.5–15.5)
WBC: 4.2 10*3/uL (ref 4.0–10.5)
nRBC: 0 % (ref 0.0–0.2)

## 2017-12-27 LAB — PROTIME-INR
INR: 1.52
PROTHROMBIN TIME: 18.1 s — AB (ref 11.4–15.2)

## 2017-12-27 LAB — LIPASE, BLOOD: LIPASE: 24 U/L (ref 11–51)

## 2017-12-27 LAB — MAGNESIUM: Magnesium: 1.6 mg/dL — ABNORMAL LOW (ref 1.7–2.4)

## 2017-12-27 MED ORDER — MORPHINE SULFATE (PF) 4 MG/ML IV SOLN
6.0000 mg | Freq: Once | INTRAVENOUS | Status: AC
  Administered 2017-12-27: 6 mg via INTRAVENOUS
  Filled 2017-12-27: qty 2

## 2017-12-27 MED ORDER — POTASSIUM CHLORIDE CRYS ER 20 MEQ PO TBCR
40.0000 meq | EXTENDED_RELEASE_TABLET | Freq: Once | ORAL | Status: AC
  Administered 2017-12-27: 40 meq via ORAL
  Filled 2017-12-27: qty 2

## 2017-12-27 MED ORDER — SODIUM CHLORIDE 0.9 % IV SOLN
6.0000 mg | Freq: Once | INTRAVENOUS | Status: DC
  Filled 2017-12-27: qty 3

## 2017-12-27 MED ORDER — LIDOCAINE HCL 1 % IJ SOLN
INTRAMUSCULAR | Status: AC
  Filled 2017-12-27: qty 10

## 2017-12-27 MED ORDER — MORPHINE SULFATE (PF) 4 MG/ML IV SOLN
4.0000 mg | Freq: Once | INTRAVENOUS | Status: DC
  Filled 2017-12-27: qty 1

## 2017-12-27 MED ORDER — ONDANSETRON HCL 4 MG/2ML IJ SOLN
4.0000 mg | Freq: Once | INTRAMUSCULAR | Status: AC
  Administered 2017-12-27: 4 mg via INTRAVENOUS
  Filled 2017-12-27: qty 2

## 2017-12-27 MED ORDER — POTASSIUM CHLORIDE CRYS ER 20 MEQ PO TBCR: 20.0000 meq | EXTENDED_RELEASE_TABLET | Freq: Every day | ORAL | 0 refills | Status: DC

## 2017-12-27 MED ORDER — ALBUMIN HUMAN 25 % IV SOLN
25.0000 g | Freq: Once | INTRAVENOUS | Status: AC
  Administered 2017-12-27: 12.5 g via INTRAVENOUS
  Filled 2017-12-27: qty 50
  Filled 2017-12-27: qty 100

## 2017-12-27 MED ORDER — MAGNESIUM SULFATE 2 GM/50ML IV SOLN
2.0000 g | Freq: Once | INTRAVENOUS | Status: AC
  Administered 2017-12-27: 2 g via INTRAVENOUS
  Filled 2017-12-27: qty 50

## 2017-12-27 MED ORDER — POTASSIUM CHLORIDE 10 MEQ/100ML IV SOLN
10.0000 meq | INTRAVENOUS | Status: AC
  Administered 2017-12-27 (×2): 10 meq via INTRAVENOUS
  Filled 2017-12-27 (×2): qty 100

## 2017-12-27 MED ORDER — MAGNESIUM OXIDE 400 (241.3 MG) MG PO TABS: 400.0000 mg | ORAL_TABLET | Freq: Every day | ORAL | 0 refills | Status: AC

## 2017-12-27 NOTE — ED Notes (Signed)
First set of blood cultures are at bedside.

## 2017-12-27 NOTE — Procedures (Signed)
PROCEDURE SUMMARY:  Successful image-guided paracentesis from the left lower abdomen.  Yielded 6.25 liters of clear yellow fluid.  No immediate complications.  Patient tolerated well.   Specimen was sent for labs.  Gordy Councilmanlexandra Katie Moch PA-C 12/27/2017 1:44 PM

## 2017-12-27 NOTE — ED Notes (Signed)
Bed: WA07 Expected date: 12/27/17 Expected time: 8:33 AM Means of arrival: Ambulance Comments: ascites  ? Needs tap

## 2017-12-27 NOTE — ED Notes (Signed)
Patient has left for Abdominal Paracentesis.
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
## 2017-12-27 NOTE — ED Triage Notes (Signed)
Patient from home, AOx4 and ambulatory. Patient called 911 due to having severe ascites. Patient usually takes lasix however was unable to take any today. Patient has been feeling nauseas. Patient is in 10/10 pain. Patient has Hx of Scerosis of liver. Patient has had some dizziness as well that started about 2 weeks ago.   CBG 108 Denies ETOH use in the past 7 days.

## 2017-12-27 NOTE — ED Notes (Signed)
US Paracentesis informed RN that procedure will not be able to be completed until or around 1300.

## 2017-12-27 NOTE — ED Notes (Signed)
Patient aware a urine sample is needed. States she is unable to void at this time.

## 2017-12-27 NOTE — ED Notes (Signed)
Patient transported to X-ray 

## 2017-12-28 LAB — GRAM STAIN: Gram Stain: NONE SEEN

## 2017-12-28 LAB — PATHOLOGIST SMEAR REVIEW

## 2017-12-28 LAB — BODY FLUID CULTURE

## 2017-12-29 ENCOUNTER — Telehealth: Payer: Self-pay

## 2017-12-29 NOTE — Telephone Encounter (Signed)
No growth on BF culture  No treatment. Per [facility]

## 2018-01-01 LAB — CULTURE, BODY FLUID W GRAM STAIN -BOTTLE

## 2018-01-01 LAB — CULTURE, BODY FLUID-BOTTLE: CULTURE: NO GROWTH

## 2018-01-03 ENCOUNTER — Observation Stay (HOSPITAL_COMMUNITY)
Admission: EM | Admit: 2018-01-03 | Discharge: 2018-01-06 | Disposition: A | Discharge status: Home / Self Care | Payer: Medicaid Other | Attending: Internal Medicine | Admitting: Internal Medicine

## 2018-01-03 ENCOUNTER — Emergency Department (HOSPITAL_COMMUNITY): Payer: Medicaid Other

## 2018-01-03 ENCOUNTER — Other Ambulatory Visit: Payer: Self-pay

## 2018-01-03 ENCOUNTER — Encounter (HOSPITAL_COMMUNITY): Payer: Self-pay

## 2018-01-03 DIAGNOSIS — R188 Other ascites: Secondary | ICD-10-CM | POA: Diagnosis present

## 2018-01-03 DIAGNOSIS — F1210 Cannabis abuse, uncomplicated: Secondary | ICD-10-CM | POA: Insufficient documentation

## 2018-01-03 DIAGNOSIS — F1410 Cocaine abuse, uncomplicated: Secondary | ICD-10-CM | POA: Insufficient documentation

## 2018-01-03 DIAGNOSIS — B182 Chronic viral hepatitis C: Secondary | ICD-10-CM | POA: Insufficient documentation

## 2018-01-03 DIAGNOSIS — K7031 Alcoholic cirrhosis of liver with ascites: Secondary | ICD-10-CM | POA: Diagnosis not present

## 2018-01-03 DIAGNOSIS — I10 Essential (primary) hypertension: Secondary | ICD-10-CM

## 2018-01-03 DIAGNOSIS — D539 Nutritional anemia, unspecified: Secondary | ICD-10-CM | POA: Insufficient documentation

## 2018-01-03 DIAGNOSIS — B1920 Unspecified viral hepatitis C without hepatic coma: Secondary | ICD-10-CM | POA: Diagnosis present

## 2018-01-03 DIAGNOSIS — D6959 Other secondary thrombocytopenia: Secondary | ICD-10-CM | POA: Insufficient documentation

## 2018-01-03 DIAGNOSIS — R1084 Generalized abdominal pain: Secondary | ICD-10-CM

## 2018-01-03 DIAGNOSIS — F17210 Nicotine dependence, cigarettes, uncomplicated: Secondary | ICD-10-CM | POA: Diagnosis not present

## 2018-01-03 DIAGNOSIS — F1020 Alcohol dependence, uncomplicated: Secondary | ICD-10-CM | POA: Insufficient documentation

## 2018-01-03 DIAGNOSIS — J90 Pleural effusion, not elsewhere classified: Secondary | ICD-10-CM | POA: Diagnosis not present

## 2018-01-03 DIAGNOSIS — K7460 Unspecified cirrhosis of liver: Secondary | ICD-10-CM

## 2018-01-03 DIAGNOSIS — E876 Hypokalemia: Secondary | ICD-10-CM | POA: Insufficient documentation

## 2018-01-03 DIAGNOSIS — K429 Umbilical hernia without obstruction or gangrene: Secondary | ICD-10-CM | POA: Diagnosis not present

## 2018-01-03 DIAGNOSIS — K7290 Hepatic failure, unspecified without coma: Secondary | ICD-10-CM | POA: Diagnosis present

## 2018-01-03 DIAGNOSIS — Z79899 Other long term (current) drug therapy: Secondary | ICD-10-CM | POA: Diagnosis not present

## 2018-01-03 DIAGNOSIS — D696 Thrombocytopenia, unspecified: Secondary | ICD-10-CM | POA: Diagnosis present

## 2018-01-03 DIAGNOSIS — R112 Nausea with vomiting, unspecified: Secondary | ICD-10-CM | POA: Diagnosis present

## 2018-01-03 DIAGNOSIS — R109 Unspecified abdominal pain: Secondary | ICD-10-CM

## 2018-01-03 MED ORDER — MORPHINE SULFATE (PF) 4 MG/ML IV SOLN
4.0000 mg | Freq: Once | INTRAVENOUS | Status: AC
  Administered 2018-01-04: 4 mg via INTRAVENOUS
  Filled 2018-01-03: qty 1

## 2018-01-03 MED ORDER — ONDANSETRON HCL 4 MG/2ML IJ SOLN
4.0000 mg | Freq: Once | INTRAMUSCULAR | Status: AC
  Administered 2018-01-04: 4 mg via INTRAVENOUS
  Filled 2018-01-03: qty 2

## 2018-01-03 NOTE — ED Notes (Signed)
Patient transported to X-ray 

## 2018-01-03 NOTE — ED Notes (Signed)
Bed: [id number] Expected date:  Expected time:  Means of arrival:  Comments: EMS:Abd pain/Hernia
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
## 2018-01-03 NOTE — ED Triage Notes (Signed)
[facility] to [facility]. Pain 10/10. Pt states she has had a hernia for 5 years and pain is chronic and intermittent. Pt also c/o numbness in toes xfew days.

## 2018-01-04 ENCOUNTER — Other Ambulatory Visit: Payer: Self-pay

## 2018-01-04 ENCOUNTER — Emergency Department (HOSPITAL_COMMUNITY): Payer: Medicaid Other

## 2018-01-04 ENCOUNTER — Observation Stay (HOSPITAL_COMMUNITY): Payer: Medicaid Other

## 2018-01-04 ENCOUNTER — Encounter (HOSPITAL_COMMUNITY): Payer: Self-pay

## 2018-01-04 DIAGNOSIS — R188 Other ascites: Secondary | ICD-10-CM | POA: Diagnosis not present

## 2018-01-04 DIAGNOSIS — K7290 Hepatic failure, unspecified without coma: Secondary | ICD-10-CM

## 2018-01-04 DIAGNOSIS — K7460 Unspecified cirrhosis of liver: Secondary | ICD-10-CM

## 2018-01-04 LAB — COMPREHENSIVE METABOLIC PANEL
ALT: 20 U/L (ref 0–44)
AST: 53 U/L — ABNORMAL HIGH (ref 15–41)
Albumin: 1.9 g/dL — ABNORMAL LOW (ref 3.5–5.0)
Alkaline Phosphatase: 142 U/L — ABNORMAL HIGH (ref 38–126)
Anion gap: 7 (ref 5–15)
BUN: 11 mg/dL (ref 6–20)
CO2: 25 mmol/L (ref 22–32)
CREATININE: 0.98 mg/dL (ref 0.44–1.00)
Calcium: 7.7 mg/dL — ABNORMAL LOW (ref 8.9–10.3)
Chloride: 106 mmol/L (ref 98–111)
GFR calc Af Amer: >60 mL/min (ref >60)
Glucose, Bld: 97 mg/dL (ref 70–99)
Potassium: 3 mmol/L — ABNORMAL LOW (ref 3.5–5.1)
Sodium: 138 mmol/L (ref 135–145)
Total Bilirubin: 2 mg/dL — ABNORMAL HIGH (ref 0.3–1.2)
Total Protein: 6.9 g/dL (ref 6.5–8.1)

## 2018-01-04 LAB — BODY FLUID CELL COUNT WITH DIFFERENTIAL
Lymphs, Fluid: 19 %
Monocyte-Macrophage-Serous Fluid: 78 % (ref 50–90)
Neutrophil Count, Fluid: 3 % (ref 0–25)
Total Nucleated Cell Count, Fluid: 112 cu mm (ref 0–1000)

## 2018-01-04 LAB — CBC
HCT: 35.1 % — ABNORMAL LOW (ref 36.0–46.0)
Hemoglobin: 11.7 g/dL — ABNORMAL LOW (ref 12.0–15.0)
MCH: 35.1 pg — ABNORMAL HIGH (ref 26.0–34.0)
MCHC: 33.3 g/dL (ref 30.0–36.0)
MCV: 105.4 fL — ABNORMAL HIGH (ref 80.0–100.0)
PLATELETS: 134 10*3/uL — AB (ref 150–400)
RBC: 3.33 MIL/uL — AB (ref 3.87–5.11)
RDW: 15.7 % — ABNORMAL HIGH (ref 11.5–15.5)
WBC: 4.5 10*3/uL (ref 4.0–10.5)
nRBC: 0 % (ref 0.0–0.2)

## 2018-01-04 LAB — IRON AND TIBC
IRON: 150 ug/dL (ref 28–170)
Saturation Ratios: 83 % — ABNORMAL HIGH (ref 10.4–31.8)
TIBC: 180 ug/dL — ABNORMAL LOW (ref 250–450)
UIBC: 30 ug/dL

## 2018-01-04 LAB — CBC WITH DIFFERENTIAL/PLATELET
ABS IMMATURE GRANULOCYTES: 0.02 10*3/uL (ref 0.00–0.07)
Basophils Absolute: 0 10*3/uL (ref 0.0–0.1)
Basophils Relative: 1 %
Eosinophils Absolute: 0.2 10*3/uL (ref 0.0–0.5)
Eosinophils Relative: 4 %
HCT: 35.5 % — ABNORMAL LOW (ref 36.0–46.0)
Hemoglobin: 11.7 g/dL — ABNORMAL LOW (ref 12.0–15.0)
Immature Granulocytes: 0 %
Lymphocytes Relative: 37 %
Lymphs Abs: 1.7 10*3/uL (ref 0.7–4.0)
MCH: 34.7 pg — ABNORMAL HIGH (ref 26.0–34.0)
MCHC: 33 g/dL (ref 30.0–36.0)
MCV: 105.3 fL — ABNORMAL HIGH (ref 80.0–100.0)
Monocytes Absolute: 0.9 10*3/uL (ref 0.1–1.0)
Monocytes Relative: 20 %
Neutro Abs: 1.7 10*3/uL (ref 1.7–7.7)
Neutrophils Relative %: 38 %
Platelets: 135 10*3/uL — ABNORMAL LOW (ref 150–400)
RBC: 3.37 MIL/uL — ABNORMAL LOW (ref 3.87–5.11)
RDW: 15.7 % — ABNORMAL HIGH (ref 11.5–15.5)
WBC: 4.6 10*3/uL (ref 4.0–10.5)
nRBC: 0 % (ref 0.0–0.2)

## 2018-01-04 LAB — PROTEIN, PLEURAL OR PERITONEAL FLUID: Total protein, fluid: <3 g/dL

## 2018-01-04 LAB — BASIC METABOLIC PANEL
ANION GAP: 7 (ref 5–15)
BUN: 11 mg/dL (ref 6–20)
CO2: 25 mmol/L (ref 22–32)
Calcium: 7.8 mg/dL — ABNORMAL LOW (ref 8.9–10.3)
Chloride: 104 mmol/L (ref 98–111)
Creatinine, Ser: 0.82 mg/dL (ref 0.44–1.00)
GFR calc non Af Amer: >60 mL/min (ref >60)
Glucose, Bld: 106 mg/dL — ABNORMAL HIGH (ref 70–99)
Potassium: 3.1 mmol/L — ABNORMAL LOW (ref 3.5–5.1)
Sodium: 136 mmol/L (ref 135–145)

## 2018-01-04 LAB — RETICULOCYTES
Immature Retic Fract: 12.2 % (ref 2.3–15.9)
RBC.: 3.22 MIL/uL — ABNORMAL LOW (ref 3.87–5.11)
Retic Count, Absolute: 118.2 10*3/uL (ref 19.0–186.0)
Retic Ct Pct: 3.7 % — ABNORMAL HIGH (ref 0.4–3.1)

## 2018-01-04 LAB — ALBUMIN, PLEURAL OR PERITONEAL FLUID: Albumin, Fluid: <1 g/dL

## 2018-01-04 LAB — GLUCOSE, PLEURAL OR PERITONEAL FLUID: Glucose, Fluid: 36 mg/dL

## 2018-01-04 LAB — FOLATE: Folate: 16.8 ng/mL (ref >5.9)

## 2018-01-04 LAB — FERRITIN: Ferritin: 321 ng/mL — ABNORMAL HIGH (ref 11–307)

## 2018-01-04 LAB — VITAMIN B12: Vitamin B-12: 1166 pg/mL — ABNORMAL HIGH (ref 180–914)

## 2018-01-04 LAB — PATHOLOGIST SMEAR REVIEW

## 2018-01-04 LAB — I-STAT BETA HCG BLOOD, ED (MC, WL, AP ONLY)

## 2018-01-04 LAB — LIPASE, BLOOD: Lipase: 33 U/L (ref 11–51)

## 2018-01-04 LAB — MAGNESIUM: MAGNESIUM: 1.8 mg/dL (ref 1.7–2.4)

## 2018-01-04 MED ORDER — POTASSIUM CHLORIDE CRYS ER 20 MEQ PO TBCR
40.0000 meq | EXTENDED_RELEASE_TABLET | Freq: Once | ORAL | Status: AC
  Administered 2018-01-04: 40 meq via ORAL
  Filled 2018-01-04: qty 2

## 2018-01-04 MED ORDER — ALBUMIN HUMAN 25 % IV SOLN
12.5000 g | Freq: Four times a day (QID) | INTRAVENOUS | Status: AC
  Administered 2018-01-04 – 2018-01-05 (×6): 12.5 g via INTRAVENOUS
  Filled 2018-01-04 (×6): qty 50

## 2018-01-04 MED ORDER — LACTULOSE 10 GM/15ML PO SOLN
30.0000 g | Freq: Two times a day (BID) | ORAL | Status: DC
  Administered 2018-01-04 – 2018-01-05 (×3): 30 g via ORAL
  Filled 2018-01-04 (×4): qty 60

## 2018-01-04 MED ORDER — ADULT MULTIVITAMIN W/MINERALS CH
1.0000 | ORAL_TABLET | Freq: Every day | ORAL | Status: DC
  Administered 2018-01-04 – 2018-01-06 (×3): 1 via ORAL
  Filled 2018-01-04 (×3): qty 1

## 2018-01-04 MED ORDER — MAGNESIUM OXIDE 400 (241.3 MG) MG PO TABS
400.0000 mg | ORAL_TABLET | Freq: Every day | ORAL | Status: DC
  Administered 2018-01-04 – 2018-01-06 (×3): 400 mg via ORAL
  Filled 2018-01-04 (×3): qty 1

## 2018-01-04 MED ORDER — SODIUM CHLORIDE (PF) 0.9 % IJ SOLN
INTRAMUSCULAR | Status: AC
  Filled 2018-01-04: qty 50

## 2018-01-04 MED ORDER — MORPHINE SULFATE (PF) 4 MG/ML IV SOLN
4.0000 mg | Freq: Once | INTRAVENOUS | Status: AC
  Administered 2018-01-04: 4 mg via INTRAVENOUS
  Filled 2018-01-04: qty 1

## 2018-01-04 MED ORDER — ONDANSETRON HCL 4 MG PO TABS: 4.0000 mg | ORAL_TABLET | Freq: Four times a day (QID) | ORAL | Status: DC | PRN

## 2018-01-04 MED ORDER — MORPHINE SULFATE (PF) 4 MG/ML IV SOLN
4.0000 mg | INTRAVENOUS | Status: DC | PRN
  Administered 2018-01-04 – 2018-01-06 (×9): 4 mg via INTRAVENOUS
  Filled 2018-01-04 (×9): qty 1

## 2018-01-04 MED ORDER — VITAMIN B-1 50 MG PO TABS
50.0000 mg | ORAL_TABLET | Freq: Every day | ORAL | Status: DC
  Filled 2018-01-04: qty 1

## 2018-01-04 MED ORDER — IOPAMIDOL (ISOVUE-300) INJECTION 61%
100.0000 mL | Freq: Once | INTRAVENOUS | Status: AC | PRN
  Administered 2018-01-04: 100 mL via INTRAVENOUS

## 2018-01-04 MED ORDER — IOPAMIDOL (ISOVUE-300) INJECTION 61%
INTRAVENOUS | Status: AC
  Filled 2018-01-04: qty 100

## 2018-01-04 MED ORDER — FUROSEMIDE 40 MG PO TABS
40.0000 mg | ORAL_TABLET | Freq: Every day | ORAL | Status: DC
  Administered 2018-01-04 – 2018-01-05 (×2): 40 mg via ORAL
  Filled 2018-01-04 (×2): qty 1

## 2018-01-04 MED ORDER — ONDANSETRON HCL 4 MG/2ML IJ SOLN: 4.0000 mg | Freq: Four times a day (QID) | INTRAMUSCULAR | Status: DC | PRN

## 2018-01-04 MED ORDER — POTASSIUM CHLORIDE CRYS ER 20 MEQ PO TBCR: 40.0000 meq | EXTENDED_RELEASE_TABLET | Freq: Once | ORAL | Status: DC

## 2018-01-04 MED ORDER — SODIUM CHLORIDE 0.9 % IV SOLN
2.0000 g | Freq: Every day | INTRAVENOUS | Status: DC
  Administered 2018-01-04: 2 g via INTRAVENOUS
  Filled 2018-01-04: qty 2

## 2018-01-04 MED ORDER — LIDOCAINE HCL 1 % IJ SOLN
INTRAMUSCULAR | Status: AC
  Filled 2018-01-04: qty 10

## 2018-01-04 MED ORDER — POTASSIUM CHLORIDE CRYS ER 20 MEQ PO TBCR
20.0000 meq | EXTENDED_RELEASE_TABLET | Freq: Every day | ORAL | Status: DC
  Administered 2018-01-04 – 2018-01-06 (×3): 20 meq via ORAL
  Filled 2018-01-04 (×3): qty 1

## 2018-01-04 MED ORDER — FOLIC ACID 1 MG PO TABS
1.0000 mg | ORAL_TABLET | Freq: Every day | ORAL | Status: DC
  Administered 2018-01-04 – 2018-01-06 (×3): 1 mg via ORAL
  Filled 2018-01-04 (×3): qty 1

## 2018-01-04 MED ORDER — ENSURE ENLIVE PO LIQD
237.0000 mL | ORAL | Status: DC
  Administered 2018-01-04 – 2018-01-05 (×3): 237 mL via ORAL

## 2018-01-04 MED ORDER — THIAMINE HCL 100 MG/ML IJ SOLN
100.0000 mg | Freq: Every day | INTRAMUSCULAR | Status: DC
  Administered 2018-01-05 – 2018-01-06 (×2): 100 mg via INTRAVENOUS
  Filled 2018-01-04 (×3): qty 2

## 2018-01-04 NOTE — Progress Notes (Signed)
PHARMACY NOTE:  ANTIMICROBIAL RENAL DOSAGE ADJUSTMENT  Current antimicrobial regimen includes a mismatch between antimicrobial dosage and indication.  As per policy approved by the Pharmacy & Therapeutics and Medical Executive Committees, the antimicrobial dosage will be adjusted accordingly.  Current antimicrobial dosage:  Rocephin 1 Gm IV q24h  Indication: SBP  Renal Function:  Estimated Creatinine Clearance: 67.7 mL/min (by C-G formula based on SCr of 0.98 mg/dL). []      On intermittent HD, scheduled: []      On CRRT    Antimicrobial dosage has been changed to:  Rocephin 2 Gm IV q24h     Thank you for allowing pharmacy to be a part of this patient's care.  Lorenza EvangelistGreen, Zariya Minner R, [facility] Institute[facility]PH 01/04/2018 4:00 AM

## 2018-01-04 NOTE — Progress Notes (Signed)
   01/04/18 1536  Clinical Encounter Type  Visited With Patient  Visit Type Initial  Referral From Nurse  Consult/Referral To Chaplain  The chaplain responded to Pt. spiritual care consult for prayer.  The medical team was present at the time of the chaplain visit. The Pt. declined prayer, stating she will be praying for herself.  The chaplain offered a F/U visit as needed.

## 2018-01-04 NOTE — ED Notes (Signed)
ED TO INPATIENT HANDOFF REPORT  Name/Age/Gender Linda Vaughn 52 y.o. female  Code Status    Code Status Orders  (From admission, onward)         Start     Ordered   01/04/18 0355  Full code  Continuous     01/04/18 0356        Code Status History    Date Active Date Inactive Code Status Order ID Comments User Context   11/25/2017 1831 11/27/2017 1857 Full Code [id number]  Zigmund DanielPowell, A Caldwell Jr., MD Inpatient   11/18/2017 2338 11/19/2017 2132 Full Code [id number]  John Giovanniathore, Vasundhra, MD Inpatient   05/24/2017 0229 05/28/2017 1537 Full Code [id number]  Therisa Doyneoutova, Anastassia, MD Inpatient   04/19/2017 2351 04/22/2017 1748 Full Code [id number]  Ellwood Denseumball, Alison, DO ED   03/25/2017 1055 03/27/2017 1657 Full Code [id number]  Russella DarEllis, Allison L, NP ED   01/26/2017 1658 01/31/2017 1551 Full Code [id number]  Shon HaleEmokpae, Courage, MD ED   11/24/2016 1035 11/28/2016 1847 Full Code [id number]  Pearson GrippeKim, James, MD ED   10/08/2015 1012 10/09/2015 1432 Full Code [id number]  Cleora FleetJohnson, Clanford L, MD Inpatient   05/24/2015 2102 05/30/2015 1712 Full Code [id number]  Albertine GratesXu, Fang, MD Inpatient      Home/SNF/Other Home  Chief Complaint abdominal painds, herina  Level of Care/Admitting Diagnosis ED Disposition    ED Disposition Condition Comment   Admit  [facility] [[id number]]  Level of Care: Telemetry [5]  Admit to tele based on following criteria: Monitor for Ischemic changes  Diagnosis: Decompensated hepatic cirrhosis [facility](HCC) [[id number]][[id number]]  Admitting Physician: Eduard ClosKAKRAKANDY, ARSHAD N [phone][[id number]]  Attending Physician: Eduard ClosKAKRAKANDY, ARSHAD N Florian.Pax[[id number]]  PT Class (Do Not Modify): Observation [104]  PT Acc Code (Do Not Modify): Observation [10022]       Medical History Past Medical History:  Diagnosis Date  . Arthritis   . Ascites   . Cirrhosis of liver (HCC)   . Gallstones   . Hepatitis C   . Hypertension   . Macrocytic anemia   . Recurrent right pleural effusion   . SBP (spontaneous bacterial  peritonitis) (HCC) 11/23/2016  . Thrombocytopenia (HCC)   . Umbilical hernia     Allergies Allergies  Allergen Reactions  . Aspirin Other (See Comments)    Liver damage   . Ibuprofen Other (See Comments)    Liver damage  . Tylenol [Acetaminophen] Other (See Comments)    Liver damage    IV Location/Drains/Wounds Patient Lines/Drains/Airways Status   Active Line/Drains/Airways    Name:   Placement date:   Placement time:   Site:   Days:   Peripheral IV 01/03/18 Right Hand   01/03/18    2353    Hand   1          Labs/Imaging Results for orders placed or performed during the hospital encounter of 01/03/18 (from the past 48 hour(s))  Comprehensive metabolic panel     Status: Abnormal   Collection Time: 01/03/18 10:46 PM  Result Value Ref Range   Sodium 138 135 - 145 mmol/L   Potassium 3.0 (L) 3.5 - 5.1 mmol/L   Chloride 106 98 - 111 mmol/L   CO2 25 22 - 32 mmol/L   Glucose, Bld 97 70 - 99 mg/dL   BUN 11 6 - 20 mg/dL   Creatinine, Ser 1.600.98 0.44 - 1.00 mg/dL   Calcium 7.7 (L) 8.9 - 10.3 mg/dL   Total Protein 6.9 6.5 - 8.1 g/dL   Albumin 1.9 (  L) 3.5 - 5.0 g/dL   AST 53 (H) 15 - 41 U/L   ALT 20 0 - 44 U/L   Alkaline Phosphatase 142 (H) 38 - 126 U/L   Total Bilirubin 2.0 (H) 0.3 - 1.2 mg/dL   GFR calc non Af Amer >60 >60 mL/min   GFR calc Af Amer >60 >60 mL/min   Anion gap 7 5 - 15    Comment: Performed at [facility], [address] [place], [place] [address]  Lipase, blood     Status: None   Collection Time: 01/03/18 10:46 PM  Result Value Ref Range   Lipase 33 11 - 51 U/L    Comment: Performed at [facility], [address] [place], [place] [address]  CBC with Differential     Status: Abnormal   Collection Time: 01/03/18 10:46 PM  Result Value Ref Range   WBC 4.6 4.0 - 10.5 K/uL   RBC 3.37 (L) 3.87 - 5.11 MIL/uL   Hemoglobin 11.7 (L) 12.0 - 15.0 g/dL   HCT 09.835.5 (L) 11.936.0 - 14.746.0 %   MCV 105.3 (H) 80.0 - 100.0 fL   MCH 34.7  (H) 26.0 - 34.0 pg   MCHC 33.0 30.0 - 36.0 g/dL   RDW 82.915.7 (H) 56.211.5 - 13.015.5 %   Platelets 135 (L) 150 - 400 K/uL    Comment: REPEATED TO VERIFY SPECIMEN CHECKED FOR CLOTS    nRBC 0.0 0.0 - 0.2 %   Neutrophils Relative % 38 %   Neutro Abs 1.7 1.7 - 7.7 K/uL   Lymphocytes Relative 37 %   Lymphs Abs 1.7 0.7 - 4.0 K/uL   Monocytes Relative 20 %   Monocytes Absolute 0.9 0.1 - 1.0 K/uL   Eosinophils Relative 4 %   Eosinophils Absolute 0.2 0.0 - 0.5 K/uL   Basophils Relative 1 %   Basophils Absolute 0.0 0.0 - 0.1 K/uL   Immature Granulocytes 0 %   Abs Immature Granulocytes 0.02 0.00 - 0.07 K/uL    Comment: Performed at [facility], [address] [place], [place] [address]  I-Stat beta hCG blood, ED     Status: None   Collection Time: 01/04/18 12:00 AM  Result Value Ref Range   I-stat hCG, quantitative <5.0 <5 mIU/mL   Comment 3            Comment:   GEST. AGE      CONC.  (mIU/mL)   <=1 WEEK        5 - 50     2 WEEKS       50 - 500     3 WEEKS       100 - 10,000     4 WEEKS     1,000 - 30,000        FEMALE AND NON-PREGNANT FEMALE:     LESS THAN 5 mIU/mL    Dg Abdomen 1 View  Result Date: 01/04/2018 CLINICAL DATA:  Abdominal distension EXAM: ABDOMEN - 1 VIEW COMPARISON:  None. FINDINGS: There are mildly dilated loops of small bowel in the central abdomen. There is an incompletely visualized right pleural effusion. Status post cholecystectomy. No acute osseous abnormality. IMPRESSION: Mildly dilated central abdominal small bowel loops. Consider CT of the abdomen and pelvis if there is concern for small bowel obstruction. Electronically Signed   By: Deatra RobinsonKevin  Herman M.D.   On: 01/04/2018 01:05   Ct Abdomen Pelvis W Contrast  Result Date: 01/04/2018 CLINICAL  DATA:  Abdominal pain and distension. Umbilical hernia. EXAM: CT ABDOMEN AND PELVIS WITH CONTRAST TECHNIQUE: Multidetector CT imaging of the abdomen and pelvis was performed using the standard protocol following  bolus administration of intravenous contrast. CONTRAST:  ISOVUE-300 IOPAMIDOL (ISOVUE-300) INJECTION 61% COMPARISON:  CT abdomen pelvis 12/13/2017 FINDINGS: LOWER CHEST: Small right pleural effusion with associated atelectasis. HEPATOBILIARY: Diffusely nodular hepatic contours with overall shrunken size, consistent with hepatic cirrhosis. No focal liver lesion. No biliary dilatation. Status post cholecystectomy. There is a large amount of ascites throughout the abdomen and pelvis. PANCREAS: The pancreatic parenchymal contours are normal and there is no ductal dilatation. There is no peripancreatic fluid collection. SPLEEN: Normal. ADRENALS/URINARY TRACT: --Adrenal glands: Normal. --Right kidney/ureter: No hydronephrosis, nephroureterolithiasis, perinephric stranding or solid renal mass. --Left kidney/ureter: No hydronephrosis, nephroureterolithiasis, perinephric stranding or solid renal mass. --Urinary bladder: Normal for degree of distention STOMACH/BOWEL: --Stomach/Duodenum: There is no hiatal hernia or other gastric abnormality. The duodenal course and caliber are normal. --Small bowel: No dilatation or inflammation. --Colon: No focal abnormality. --Appendix: Normal. VASCULAR/LYMPHATIC: There is aortic atherosclerosis without hemodynamically significant stenosis. The portal vein, splenic vein, superior mesenteric vein and IVC are patent. No abdominal or pelvic lymphadenopathy. REPRODUCTIVE: Normal uterus and ovaries. MUSCULOSKELETAL. No bony spinal canal stenosis or focal osseous abnormality. OTHER: There is a multiloculated fluid collection within a ventral abdominal hernia, measuring approximately 13.7 x 8.2 x 17.3 cm. IMPRESSION: 1. Large multiloculated fluid collection within a ventral abdominal hernia, measuring 13.7 x 8.2 x 17.3 cm, favored to be chronic encysted ascites. 2. Hepatic cirrhosis and large volume ascites. 3. Small right pleural effusion with associated atelectasis. Electronically Signed    By: Deatra Robinson M.D.   On: 01/04/2018 03:10    Pending Labs Unresulted Labs (From admission, onward)    Start     Ordered   01/04/18 0500  Basic metabolic panel  Tomorrow morning,   R     01/04/18 0356   01/04/18 0500  CBC  Tomorrow morning,   R     01/04/18 0356   01/03/18 2246  Urinalysis, Routine w reflex microscopic  ONCE - STAT,   STAT     01/03/18 2248          Vitals/Pain Today's Vitals   01/04/18 0032 01/04/18 0033 01/04/18 0339 01/04/18 0428  BP: 117/82  99/66   Pulse: 75  72   Resp: 18  16   Temp:      TempSrc:      SpO2: 99%  98%   Weight:      Height:      PainSc:  9   Asleep    Isolation Precautions No active isolations  Medications Medications  iopamidol (ISOVUE-300) 61 % injection (has no administration in time range)  sodium chloride (PF) 0.9 % injection (has no administration in time range)  furosemide (LASIX) tablet 40 mg (has no administration in time range)  lactulose (CHRONULAC) 10 GM/15ML solution 30 g (has no administration in time range)  folic acid (FOLVITE) tablet 1 mg (has no administration in time range)  feeding supplement (ENSURE ENLIVE) (ENSURE ENLIVE) liquid 237 mL (has no administration in time range)  magnesium oxide (MAG-OX) tablet 400 mg (has no administration in time range)  multivitamin with minerals tablet 1 tablet (has no administration in time range)  potassium chloride SA (K-DUR,KLOR-CON) CR tablet 20 mEq (has no administration in time range)  Vitamin B1 TABS 50 mg (has no administration in time range)  ondansetron (ZOFRAN)  tablet 4 mg (has no administration in time range)    Or  ondansetron (ZOFRAN) injection 4 mg (has no administration in time range)  cefTRIAXone (ROCEPHIN) 2 g in sodium chloride 0.9 % 100 mL IVPB (has no administration in time range)  albumin human 25 % solution 12.5 g (has no administration in time range)  ondansetron (ZOFRAN) injection 4 mg (4 mg Intravenous Given 01/04/18 0001)  morphine 4 MG/ML  injection 4 mg (4 mg Intravenous Given 01/04/18 0001)  potassium chloride SA (K-DUR,KLOR-CON) CR tablet 40 mEq (40 mEq Oral Given 01/04/18 0346)  iopamidol (ISOVUE-300) 61 % injection 100 mL (100 mLs Intravenous Contrast Given 01/04/18 0246)  morphine 4 MG/ML injection 4 mg (4 mg Intravenous Given 01/04/18 0347)    Mobility walks
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
## 2018-01-04 NOTE — Progress Notes (Signed)
Pt returned from paracentesis, stable condition, pt c/o abd tenderness. Will cont to monitor. SRP, RN

## 2018-01-04 NOTE — Procedures (Signed)
Ultrasound-guided diagnostic and therapeutic paracentesis performed yielding 6 liters of yellow fluid. No immediate complications.  A portion of the fluid was submitted to the lab for preordered studies. EBL none.

## 2018-01-04 NOTE — Progress Notes (Signed)
PROGRESS NOTE   Linda Vaughn  ZOX:[id number]    DOB: 10-Jan-1965    DOA: 01/03/2018  PCP: Mike Gip, FNP   I have briefly reviewed patients previous medical records in [facility].  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  Reports compliance with Lasix but stopped taking Aldactone.  Admitted for recurrent symptomatic ascites.  Ascitic fluid not consistent with SBP.  Paracentesis 12/30: 6 L 12/22: 6.25 L 12/8: 7 L 11/21: 6.7 L 11/14: 5.7 L 11/5: 6.7 L.  Assessment & Plan:   Active Problems:   Ascites   Hypertension   Hepatitis C   Thrombocytopenia (HCC)   Decompensated hepatic cirrhosis (HCC)   1. Cirrhosis of liver (hepatitis C and alcoholism) with recurrent ascites: IR was consulted and she underwent 6 L therapeutic paracentesis on 12/30.  Ascitic fluid: 112 WBCs, 78 monocytes, 19 lymphocytes and 3 neutrophils.  Not consistent with SBP.  Discontinued empirically started IV ceftriaxone.  She received albumin infusion.  Continued Lasix 40 mg daily.  Due to high volume paracentesis today, plan to resume Aldactone in a.m.  Diuretics need to be titrated up.  Counseled extensively regarding compliance with alcohol cessation and medication compliance.  She verbalized understanding. 2. Macrocytic anemia: Likely related to chronic disease, alcoholism and cirrhosis.  Stable.  No bleeding reported. 3. Thrombocytopenia: Related to cirrhosis.  Stable. 4. Alcohol dependence: Patient reports that she does not drink as much as before and her last drink was a beer on Christmas day.  Monitor closely for withdrawal symptoms.  Continue  thiamine. 5. Essential hypertension: Not on medications PTA.  Monitor for now. 6. Ventral hernia: As per CT abdomen with pelvis report 12/30: Patient has large multiloculated fluid collection within a ventral abdominal hernia measuring 13 x 7 x 8 0.2 x 17.3 cm favored to be chronic ensisted ascites. 7. Hypokalemia: Replace and follow.  Check and replace magnesium as needed.   DVT prophylaxis: SCD Code Status: Full Family Communication: None at bedside Disposition: DC home pending clinical improvement   Consultants:  Interventional radiology  Procedures:  Therapeutic paracentesis 6 L by IR under ultrasound guidance on 12/30  Antimicrobials:  IV ceftriaxone-discontinued 12/30   Subjective: Patient was seen prior to procedure this morning.  Reported abdominal distention and pain.  No further nausea or vomiting since hospital admission.  Had BM since hospital admission.  No fever or chills.  Denies dyspnea.  ROS: As above, otherwise negative.  Objective:  Vitals:   01/04/18 1049 01/04/18 1051 01/04/18 1106 01/04/18 1230  BP: 103/66 100/69 108/64 (!) 148/136  Pulse:    69  Resp:    18  Temp:      TempSrc:      SpO2:    97%  Weight:      Height:        Examination:  General exam: Middle-aged female, moderately built and nourished, in mild painful discomfort lying supine in bed. Respiratory system: Slightly diminished breath sounds in the bases but otherwise clear to auscultation. Respiratory effort normal. Cardiovascular system: S1 & S2 heard, RRR. No JVD, murmurs, rubs, gallops or clicks. No pedal edema.  Telemetry personally reviewed: Sinus rhythm. Gastrointestinal system: Abdomen markedly distended and tense with  mild diffuse tenderness this morning.  Has multiloculated ventral hernia which is not reducible and has no acute findings i.e. redness and in fact that area is not tender.  Normal bowel sounds heard.  No organomegaly appreciated.  Tense ascites. Central nervous  system: Alert and oriented. No focal neurological deficits. Extremities: Symmetric 5 x 5 power. Skin: No rashes, lesions or ulcers Psychiatry: Judgement and insight appear normal. Mood & affect appropriate.     Data Reviewed: I have personally reviewed following labs and imaging studies  CBC: Recent Labs  Lab 01/03/18 2246 01/04/18 0740  WBC 4.6 4.5  NEUTROABS 1.7  --   HGB 11.7* 11.7*  HCT 35.5* 35.1*  MCV 105.3* 105.4*  PLT 135* 134*   Basic Metabolic Panel: Recent Labs  Lab 01/03/18 2246 01/04/18 0740  NA 138 136  K 3.0* 3.1*  CL 106 104  CO2 25 25  GLUCOSE 97 106*  BUN 11 11  CREATININE 0.98 0.82  CALCIUM 7.7* 7.8*   Liver Function Tests: Recent Labs  Lab 01/03/18 2246  AST 53*  ALT 20  ALKPHOS 142*  BILITOT 2.0*  PROT 6.9  ALBUMIN 1.9*     Recent Results (from the past 240 hour(s))  Body fluid culture     Status: None   Collection Time: 12/27/17  1:16 PM  Result Value Ref Range Status   Specimen Description PERITONEAL  Final   Special Requests NONE  Final   Culture   Final    DUPLICTE ORDER PLEASE SEE BODY FLUID BOTTLE CULTURE Performed at [facility], [address] [place], [place] [address]    Report Status 12/28/2017 FINAL  Final  Culture, body fluid-bottle     Status: None   Collection Time: 12/27/17  1:16 PM  Result Value Ref Range Status   Specimen Description PERITONEAL  Final   Special Requests NONE  Final   Culture   Final    NO GROWTH 5 DAYS Performed at [facility], [address] [place], [place] [address]    Report Status 01/01/2018 FINAL  Final  Gram stain     Status: None   Collection Time: 12/27/17  1:16 PM  Result Value Ref Range Status   Specimen Description PERITONEAL  Final   Special Requests NONE  Final   Gram Stain   Final    NO WBC SEEN NO ORGANISMS SEEN CYTOSPIN SMEAR Performed at [facility], [address] [place], [place] [address]    Report Status 12/28/2017 FINAL  Final          Radiology Studies: Dg Abdomen 1 View  Result Date: 01/04/2018 CLINICAL DATA:  Abdominal distension EXAM: ABDOMEN - 1 VIEW COMPARISON:  None. FINDINGS: There are mildly dilated loops of small bowel in the central abdomen. There is an incompletely visualized right pleural effusion. Status post cholecystectomy. No acute osseous abnormality. IMPRESSION: Mildly dilated central abdominal small bowel loops. Consider CT of the abdomen and pelvis if there is concern for small bowel obstruction. Electronically Signed   By: Deatra RobinsonKevin  Herman M.D.   On: 01/04/2018 01:05   Ct Abdomen Pelvis W Contrast  Result Date: 01/04/2018 CLINICAL DATA:  Abdominal pain and distension. Umbilical hernia. EXAM: CT ABDOMEN AND PELVIS WITH CONTRAST TECHNIQUE: Multidetector CT imaging of the abdomen and pelvis was performed using the standard protocol following bolus administration of intravenous contrast. CONTRAST:  100mL ISOVUE-300 IOPAMIDOL (ISOVUE-300) INJECTION 61% COMPARISON:  CT abdomen pelvis 12/13/2017 FINDINGS: LOWER CHEST: Small  right pleural effusion with associated atelectasis. HEPATOBILIARY: Diffusely nodular hepatic contours with overall shrunken size, consistent with hepatic cirrhosis. No focal liver lesion. No biliary dilatation. Status post cholecystectomy. There is a large amount of ascites throughout the abdomen and pelvis. PANCREAS: The pancreatic parenchymal contours are normal and there is no ductal dilatation. There is no peripancreatic fluid collection. SPLEEN: Normal. ADRENALS/URINARY TRACT: --Adrenal glands: Normal. --Right kidney/ureter: No hydronephrosis, nephroureterolithiasis, perinephric stranding or solid renal mass. --Left kidney/ureter: No hydronephrosis, nephroureterolithiasis, perinephric stranding or solid renal mass. --Urinary bladder: Normal for degree of distention STOMACH/BOWEL: --Stomach/Duodenum: There is no hiatal hernia or other gastric abnormality. The duodenal course and caliber are  normal. --Small bowel: No dilatation or inflammation. --Colon: No focal abnormality. --Appendix: Normal. VASCULAR/LYMPHATIC: There is aortic atherosclerosis without hemodynamically significant stenosis. The portal vein, splenic vein, superior mesenteric vein and IVC are patent. No abdominal or pelvic lymphadenopathy. REPRODUCTIVE: Normal uterus and ovaries. MUSCULOSKELETAL. No bony spinal canal stenosis or focal osseous abnormality. OTHER: There is a multiloculated fluid collection within a ventral abdominal hernia, measuring approximately 13.7 x 8.2 x 17.3 cm. IMPRESSION: 1. Large multiloculated fluid collection within a ventral abdominal hernia, measuring 13.7 x 8.2 x 17.3 cm, favored to be chronic encysted ascites. 2. Hepatic cirrhosis and large volume ascites. 3. Small right pleural effusion with associated atelectasis. Electronically Signed   By: Deatra RobinsonKevin  Herman M.D.   On: 01/04/2018 03:10   [place] Paracentesis  Result Date: 01/04/2018 INDICATION: Patient with history of hepatitis-C, cirrhosis, recurrent ascites. Request made for diagnostic and therapeutic paracentesis. EXAM: ULTRASOUND GUIDED DIAGNOSTIC AND THERAPEUTIC PARACENTESIS MEDICATIONS: None COMPLICATIONS: None immediate. PROCEDURE: Informed written consent was obtained from the patient after a discussion of the risks, benefits and alternatives to treatment. A timeout was performed prior to the initiation of the procedure. Initial ultrasound scanning demonstrates a large amount of ascites within the left lower abdominal quadrant. The left lower abdomen was prepped and draped in the usual sterile fashion. 1% lidocaine was used for local anesthesia. Following this, a 19 gauge, 10-cm, Yueh catheter was introduced. An ultrasound image was saved for documentation purposes. The paracentesis was performed. The catheter was removed and a dressing was applied. The patient tolerated the procedure well without immediate post procedural complication. FINDINGS: A  total of approximately 6 liters of yellow fluid was removed. Samples were sent to the laboratory as requested by the clinical team. IMPRESSION: Successful ultrasound-guided diagnostic and therapeutic paracentesis yielding 6 liters of peritoneal fluid. Read by: Jeananne RamaKevin Allred, PA-C Electronically Signed   By: Simonne ComeJohn  Watts M.D.   On: 01/04/2018 11:18        Scheduled Meds: . feeding supplement (ENSURE ENLIVE)  237 mL Oral Q24H  . folic acid  1 mg Oral Daily  . furosemide  40 mg Oral Daily  . lactulose  30 g Oral BID  . magnesium oxide  400 mg Oral Daily  . multivitamin with minerals  1 tablet Oral Daily  . potassium chloride SA  20 mEq Oral Daily  . potassium chloride  40 mEq Oral Once  . thiamine injection  100 mg Intravenous Daily   Continuous Infusions: . albumin human 12.5 g (01/04/18 0954)  . cefTRIAXone (ROCEPHIN)  IV 2 g (01/04/18 0710)     LOS: 0 days     Marcellus ScottAnand Velma Agnes, MD, FACP, [facility]. [facility] Pager [phone]  If 7PM-7AM, please contact night-coverage [email or web address] Password [email or web address] 01/04/2018, 3:03 PM

## 2018-01-05 DIAGNOSIS — R188 Other ascites: Secondary | ICD-10-CM | POA: Diagnosis not present

## 2018-01-05 DIAGNOSIS — K7460 Unspecified cirrhosis of liver: Secondary | ICD-10-CM | POA: Diagnosis not present

## 2018-01-05 LAB — RAPID URINE DRUG SCREEN, HOSP PERFORMED
Amphetamines: NOT DETECTED
Barbiturates: NOT DETECTED
Benzodiazepines: NOT DETECTED
Cocaine: POSITIVE — AB
Opiates: POSITIVE — AB
Tetrahydrocannabinol: POSITIVE — AB

## 2018-01-05 LAB — URINALYSIS, ROUTINE W REFLEX MICROSCOPIC
Bilirubin Urine: NEGATIVE
Glucose, UA: NEGATIVE mg/dL
HGB URINE DIPSTICK: NEGATIVE
Ketones, ur: 5 mg/dL — AB
Leukocytes, UA: NEGATIVE
Nitrite: NEGATIVE
Protein, ur: NEGATIVE mg/dL
Specific Gravity, Urine: 1.027 (ref 1.005–1.030)
pH: 6 (ref 5.0–8.0)

## 2018-01-05 LAB — BASIC METABOLIC PANEL
Anion gap: 6 (ref 5–15)
BUN: 10 mg/dL (ref 6–20)
CO2: 24 mmol/L (ref 22–32)
Calcium: 8.1 mg/dL — ABNORMAL LOW (ref 8.9–10.3)
Chloride: 105 mmol/L (ref 98–111)
Creatinine, Ser: 0.84 mg/dL (ref 0.44–1.00)
GFR calc Af Amer: >60 mL/min (ref >60)
GFR calc non Af Amer: >60 mL/min (ref >60)
Glucose, Bld: 116 mg/dL — ABNORMAL HIGH (ref 70–99)
Potassium: 3.9 mmol/L (ref 3.5–5.1)
SODIUM: 135 mmol/L (ref 135–145)

## 2018-01-05 LAB — CBC
HCT: 34.4 % — ABNORMAL LOW (ref 36.0–46.0)
Hemoglobin: 11.1 g/dL — ABNORMAL LOW (ref 12.0–15.0)
MCH: 35.5 pg — AB (ref 26.0–34.0)
MCHC: 32.3 g/dL (ref 30.0–36.0)
MCV: 109.9 fL — ABNORMAL HIGH (ref 80.0–100.0)
Platelets: 128 10*3/uL — ABNORMAL LOW (ref 150–400)
RBC: 3.13 MIL/uL — AB (ref 3.87–5.11)
RDW: 15.4 % (ref 11.5–15.5)
WBC: 4.4 10*3/uL (ref 4.0–10.5)
nRBC: 0 % (ref 0.0–0.2)

## 2018-01-05 LAB — GLUCOSE, CAPILLARY: Glucose-Capillary: 92 mg/dL (ref 70–99)

## 2018-01-05 MED ORDER — FUROSEMIDE 40 MG PO TABS
80.0000 mg | ORAL_TABLET | Freq: Two times a day (BID) | ORAL | Status: DC
  Administered 2018-01-05 – 2018-01-06 (×2): 80 mg via ORAL
  Filled 2018-01-05 (×2): qty 2

## 2018-01-05 MED ORDER — SPIRONOLACTONE 25 MG PO TABS
100.0000 mg | ORAL_TABLET | Freq: Every day | ORAL | Status: DC
  Administered 2018-01-05 – 2018-01-06 (×2): 100 mg via ORAL
  Filled 2018-01-05 (×2): qty 4
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
## 2018-01-05 NOTE — Care Management (Signed)
CM consult for PCP. Medicaid assigns PCPs to their participants. Pt has been assigned to Mike GipAndre Douglas FNP. Pt states that she was told she has to go into the office to make an appointment due to missing appointments. Pt states she plans to go into PCP office to make appointment when she is discharged. Sandford Crazeora Arik Husmann RN,BSN [phone]

## 2018-01-05 NOTE — Progress Notes (Signed)
PROGRESS NOTE   Linda Vaughn  ZOX:[id number]    DOB: 05-17-1965    DOA: 01/03/2018  PCP: Mike Gip, FNP   I have briefly reviewed patients previous medical records in [facility].  Brief Narrative:  52 year old female with PMH of cirrhosis secondary to hepatitis C and alcoholism complicated by recurrent ascites/SBP, HTN, macrocytic anemia, thrombocytopenia, umbilical hernia, ongoing tobacco and alcohol use, presented to the [facility] ED on 01/03/2018 with complaints of abdominal distention, pain, nausea and nonbloody emesis for a day prior to admission.  Reports compliance with Lasix but stopped taking Aldactone.  Admitted for recurrent symptomatic ascites.  Ascitic fluid not consistent with SBP.  Paracentesis 12/30: 6 L 12/22: 6.25 L 12/8: 7 L 11/21: 6.7 L 11/14: 5.7 L 11/5: 6.7 L.  Assessment & Plan:   Active Problems:   Ascites   Hypertension   Hepatitis C   Thrombocytopenia (HCC)   Decompensated hepatic cirrhosis (HCC)   1. Cirrhosis of liver (hepatitis C and alcoholism) with recurrent ascites: IR was consulted and she underwent 6 L therapeutic paracentesis on 12/30.  Ascitic fluid: 112 WBCs, 78 monocytes, 19 lymphocytes and 3 neutrophils.  Not consistent with SBP.  Discontinued empirically started IV ceftriaxone.  She received albumin infusion. Counseled extensively regarding compliance with alcohol cessation and medication compliance.  She verbalized understanding.  Patient reports that she does not have a gastroenterologist in [place], follows up with a surgeon at [facility] who is planning hernia surgery and reportedly a colonoscopy on 01/19/2018.  She states that she cannot afford to travel out of town for a gastroenterologist.  She was released from [facility] due to missing appointments.  She was agreeable to see a new gastroenterologist.  [facility] consulted and input appreciated.  Increase diuretics to Lasix 80 mg twice daily and Aldactone 100 mg daily and  will need outpatient follow-up regarding EGD for variceal screening and colonoscopy for colon cancer screening.  Monitor closely. 2. Macrocytic anemia: Likely related to chronic disease, alcoholism and cirrhosis.  Stable.  No bleeding reported. 3. Thrombocytopenia: Related to cirrhosis.  Stable. 4. Alcohol dependence: Patient reports that she does not drink as much as before and her last drink was a beer on Christmas day.  Monitor closely for withdrawal symptoms.  Continue thiamine. 5. Essential hypertension: Not on medications PTA.  Monitor for now. 6. Ventral hernia: As per CT abdomen with pelvis report 12/30: Patient has large multiloculated fluid collection within a ventral abdominal hernia measuring 13 x 7 x 8 0.2 x 17.3 cm favored to be chronic ensisted ascites. 7. Hypokalemia: Replaced.  Magnesium normal. 8. Polysubstance abuse (cocaine and THC): UDS positive for opiates, cocaine and THC.   DVT prophylaxis: SCD Code Status: Full Family Communication: None at bedside Disposition: DC home pending clinical improvement   Consultants:  Interventional radiology  Procedures:  Therapeutic paracentesis 6 L by IR under ultrasound guidance on 12/30  Antimicrobials:  IV ceftriaxone-discontinued 12/30   Subjective: Abdominal distention and pain have improved.  Tearful and frustrated that she does not have local GI to follow and cannot afford to go to [facility].  However on chart review, released from [facility] in 2017 due to not keeping up appointments.  ROS: As above, otherwise negative.  Objective:  Vitals:   01/04/18 1230 01/04/18 2031 01/05/18 0522 01/05/18 1352  BP: (!) 148/136 91/70 99/76 120/80  Pulse: 69 71 83 71  Resp: 18 16 18 18   Temp:  98.2 F (36.8  C) 97.7 F (36.5 C) 98.2 F (36.8 C)  TempSrc:  Oral Oral Oral  SpO2: 97% 97% 94% 95%  Weight:   89.5 kg   Height:        Examination:  General exam: Middle-aged female, moderately built and nourished, lying  comfortably supine in bed.  Appears much improved compared to yesterday. Respiratory system: Slightly diminished breath sounds in the bases but otherwise clear to auscultation. Respiratory effort normal. Cardiovascular system: S1 & S2 heard, RRR. No JVD, murmurs, rubs, gallops or clicks. No pedal edema.  Telemetry personally reviewed: Sinus rhythm.  Stable. Gastrointestinal system: Abdomen distention has improved/moderate distention now, soft and nontender.  Has multiloculated ventral hernia which is not reducible and has no acute findings i.e. redness and in fact that area is not tender.  Normal bowel sounds heard.  No organomegaly appreciated.  Ascites + +. Central nervous system: Alert and oriented. No focal neurological deficits. Extremities: Symmetric 5 x 5 power. Skin: No rashes, lesions or ulcers Psychiatry: Judgement and insight appear normal. Mood & affect appropriate.     Data Reviewed: I have personally reviewed following labs and imaging studies  CBC: Recent Labs  Lab 01/03/18 2246 01/04/18 0740 01/05/18 0515  WBC 4.6 4.5 4.4  NEUTROABS 1.7  --   --   HGB 11.7* 11.7* 11.1*  HCT 35.5* 35.1* 34.4*  MCV 105.3* 105.4* 109.9*  PLT 135* 134* 128*   Basic Metabolic Panel: Recent Labs  Lab 01/03/18 2246 01/04/18 0740 01/04/18 1537 01/05/18 0515  NA 138 136  --  135  K 3.0* 3.1*  --  3.9  CL 106 104  --  105  CO2 25 25  --  24  GLUCOSE 97 106*  --  116*  BUN 11 11  --  10  CREATININE 0.98 0.82  --  0.84  CALCIUM 7.7* 7.8*  --  8.1*  MG  --   --  1.8  --    Liver Function Tests: Recent Labs  Lab 01/03/18 2246  AST 53*  ALT 20  ALKPHOS 142*  BILITOT 2.0*  PROT 6.9  ALBUMIN 1.9*     Recent Results (from the past 240 hour(s))  Body fluid culture     Status: None   Collection Time: 12/27/17  1:16 PM  Result Value Ref Range Status   Specimen Description PERITONEAL  Final   Special Requests NONE  Final   Culture   Final    DUPLICTE ORDER PLEASE SEE BODY  FLUID BOTTLE CULTURE Performed at [facility], [address] [place], [place] [address]    Report Status 12/28/2017 FINAL  Final  Culture, body fluid-bottle     Status: None   Collection Time: 12/27/17  1:16 PM  Result Value Ref Range Status   Specimen Description PERITONEAL  Final   Special Requests NONE  Final   Culture   Final    NO GROWTH 5 DAYS Performed at [facility], [address] [place], [place] [address]    Report Status 01/01/2018 FINAL  Final  Gram stain     Status: None   Collection Time: 12/27/17  1:16 PM  Result Value Ref Range Status   Specimen Description PERITONEAL  Final   Special Requests NONE  Final   Gram Stain   Final    NO WBC SEEN NO ORGANISMS SEEN CYTOSPIN SMEAR Performed at [facility], [address] [place], [place] [address]    Report Status  12/28/2017 FINAL  Final  Body fluid culture     Status: None (Preliminary result)   Collection Time: 01/04/18 10:39 AM  Result Value Ref Range Status   Specimen Description   Final    Peritoneal Performed at [facility], [address] [place], [place] [address]    Special Requests   Final    NONE Performed at [facility], [address] [place], [place] [address]    Gram Stain   Final    WBC PRESENT, PREDOMINANTLY MONONUCLEAR NO ORGANISMS SEEN CYTOSPIN SMEAR    Culture   Final    NO GROWTH < 24 HOURS Performed at [facility], [address] [place], [place] [address]    Report Status PENDING  Incomplete         Radiology Studies: Dg Abdomen 1 View  Result Date: 01/04/2018 CLINICAL DATA:  Abdominal distension EXAM: ABDOMEN - 1 VIEW COMPARISON:  None. FINDINGS: There are mildly dilated loops of small bowel in the central abdomen. There is an incompletely visualized right pleural effusion. Status post cholecystectomy. No acute osseous abnormality. IMPRESSION: Mildly dilated central abdominal small bowel loops. Consider  CT of the abdomen and pelvis if there is concern for small bowel obstruction. Electronically Signed   By: Deatra Robinson M.D.   On: 01/04/2018 01:05   Ct Abdomen Pelvis W Contrast  Result Date: 01/04/2018 CLINICAL DATA:  Abdominal pain and distension. Umbilical hernia. EXAM: CT ABDOMEN AND PELVIS WITH CONTRAST TECHNIQUE: Multidetector CT imaging of the abdomen and pelvis was performed using the standard protocol following bolus administration of intravenous contrast. CONTRAST:  ISOVUE-300 IOPAMIDOL (ISOVUE-300) INJECTION 61% COMPARISON:  CT abdomen pelvis 12/13/2017 FINDINGS: LOWER CHEST: Small right pleural effusion with associated atelectasis. HEPATOBILIARY: Diffusely nodular hepatic contours with overall shrunken size, consistent with hepatic cirrhosis. No focal liver lesion. No biliary dilatation. Status post cholecystectomy. There is a large amount of ascites throughout the abdomen and pelvis. PANCREAS: The pancreatic parenchymal contours are normal and there is no ductal dilatation. There is no peripancreatic fluid collection. SPLEEN: Normal. ADRENALS/URINARY TRACT: --Adrenal glands: Normal. --Right kidney/ureter: No hydronephrosis, nephroureterolithiasis, perinephric stranding or solid renal mass. --Left kidney/ureter: No hydronephrosis, nephroureterolithiasis, perinephric stranding or solid renal mass. --Urinary bladder: Normal for degree of distention STOMACH/BOWEL: --Stomach/Duodenum: There is no hiatal hernia or other gastric abnormality. The duodenal course and caliber are normal. --Small bowel: No dilatation or inflammation. --Colon: No focal abnormality. --Appendix: Normal. VASCULAR/LYMPHATIC: There is aortic atherosclerosis without hemodynamically significant stenosis. The portal vein, splenic vein, superior mesenteric vein and IVC are patent. No abdominal or pelvic lymphadenopathy. REPRODUCTIVE: Normal uterus and ovaries. MUSCULOSKELETAL. No bony spinal canal stenosis or focal osseous  abnormality. OTHER: There is a multiloculated fluid collection within a ventral abdominal hernia, measuring approximately 13.7 x 8.2 x 17.3 cm. IMPRESSION: 1. Large multiloculated fluid collection within a ventral abdominal hernia, measuring 13.7 x 8.2 x 17.3 cm, favored to be chronic encysted ascites. 2. Hepatic cirrhosis and large volume ascites. 3. Small right pleural effusion with associated atelectasis. Electronically Signed   By: Deatra Robinson M.D.   On: 01/04/2018 03:10   US Paracentesis  Result Date: 01/04/2018 INDICATION: Patient with history of hepatitis-C, cirrhosis, recurrent ascites. Request made for diagnostic and therapeutic paracentesis. EXAM: ULTRASOUND GUIDED DIAGNOSTIC AND THERAPEUTIC PARACENTESIS MEDICATIONS: None COMPLICATIONS: None immediate. PROCEDURE: Informed written consent was obtained from the patient after a discussion of the risks, benefits and alternatives to treatment. A timeout was performed prior to  the initiation of the procedure. Initial ultrasound scanning demonstrates a large amount of ascites within the left lower abdominal quadrant. The left lower abdomen was prepped and draped in the usual sterile fashion. 1% lidocaine was used for local anesthesia. Following this, a 19 gauge, 10-cm, Yueh catheter was introduced. An ultrasound image was saved for documentation purposes. The paracentesis was performed. The catheter was removed and a dressing was applied. The patient tolerated the procedure well without immediate post procedural complication. FINDINGS: A total of approximately 6 liters of yellow fluid was removed. Samples were sent to the laboratory as requested by the clinical team. IMPRESSION: Successful ultrasound-guided diagnostic and therapeutic paracentesis yielding 6 liters of peritoneal fluid. Read by: Jeananne RamaKevin Allred, PA-C Electronically Signed   By: Simonne ComeJohn  Watts M.D.   On: 01/04/2018 11:18        Scheduled Meds: . feeding supplement (ENSURE ENLIVE)  237 mL  Oral Q24H  . folic acid  1 mg Oral Daily  . furosemide  80 mg Oral BID  . lactulose  30 g Oral BID  . magnesium oxide  400 mg Oral Daily  . multivitamin with minerals  1 tablet Oral Daily  . potassium chloride SA  20 mEq Oral Daily  . spironolactone  100 mg Oral Daily  . thiamine injection  100 mg Intravenous Daily   Continuous Infusions:    LOS: 0 days     Marcellus ScottAnand Burnetta Kohls, MD, FACP, Mdsine LLCFHM. [facility] Pager [phone]  If 7PM-7AM, please contact night-coverage [email or web address] Password [email or web address] 01/05/2018, 6:14 PM

## 2018-01-06 DIAGNOSIS — I10 Essential (primary) hypertension: Secondary | ICD-10-CM

## 2018-01-06 DIAGNOSIS — D696 Thrombocytopenia, unspecified: Secondary | ICD-10-CM

## 2018-01-06 DIAGNOSIS — B182 Chronic viral hepatitis C: Secondary | ICD-10-CM

## 2018-01-06 DIAGNOSIS — K7031 Alcoholic cirrhosis of liver with ascites: Secondary | ICD-10-CM

## 2018-01-06 DIAGNOSIS — K429 Umbilical hernia without obstruction or gangrene: Secondary | ICD-10-CM

## 2018-01-06 DIAGNOSIS — K7460 Unspecified cirrhosis of liver: Secondary | ICD-10-CM | POA: Diagnosis not present

## 2018-01-06 LAB — CBC
HCT: 34.5 % — ABNORMAL LOW (ref 36.0–46.0)
Hemoglobin: 11.3 g/dL — ABNORMAL LOW (ref 12.0–15.0)
MCH: 35.6 pg — ABNORMAL HIGH (ref 26.0–34.0)
MCHC: 32.8 g/dL (ref 30.0–36.0)
MCV: 108.8 fL — AB (ref 80.0–100.0)
Platelets: 135 10*3/uL — ABNORMAL LOW (ref 150–400)
RBC: 3.17 MIL/uL — ABNORMAL LOW (ref 3.87–5.11)
RDW: 15.2 % (ref 11.5–15.5)
WBC: 4.7 10*3/uL (ref 4.0–10.5)
nRBC: 0 % (ref 0.0–0.2)

## 2018-01-06 LAB — BASIC METABOLIC PANEL
ANION GAP: 6 (ref 5–15)
BUN: 10 mg/dL (ref 6–20)
CO2: 26 mmol/L (ref 22–32)
Calcium: 8.2 mg/dL — ABNORMAL LOW (ref 8.9–10.3)
Chloride: 104 mmol/L (ref 98–111)
Creatinine, Ser: 0.84 mg/dL (ref 0.44–1.00)
GFR calc Af Amer: >60 mL/min (ref >60)
GFR calc non Af Amer: >60 mL/min (ref >60)
GLUCOSE: 92 mg/dL (ref 70–99)
Potassium: 4.3 mmol/L (ref 3.5–5.1)
Sodium: 136 mmol/L (ref 135–145)

## 2018-01-06 MED ORDER — SPIRONOLACTONE 100 MG PO TABS: 100.0000 mg | ORAL_TABLET | Freq: Every day | ORAL | 0 refills | Status: AC

## 2018-01-06 MED ORDER — FUROSEMIDE 40 MG PO TABS: 80.0000 mg | ORAL_TABLET | Freq: Two times a day (BID) | ORAL | 0 refills | Status: AC

## 2018-01-06 NOTE — Care Management Note (Signed)
Case Management Note  Patient Details  Name: Linda Vaughn MRN: [id number] Date of Birth: 07/13/1965  Subjective/Objective:  Patient has health insurance,has a pcp,& pharmacy. No CM needs.                  Action/Plan:dc home.   Expected Discharge Date:  01/06/18               Expected Discharge Plan:  Home/Self Care  In-House Referral:     Discharge planning Services  CM Consult  Post Acute Care Choice:    Choice offered to:     DME Arranged:    DME Agency:     HH Arranged:    HH Agency:     Status of Service:  In process, will continue to follow  If discussed at Long Length of Stay Meetings, dates discussed:    Additional Comments:  Lanier Clam, RN 01/06/2018, 12:17 PM

## 2018-01-06 NOTE — Plan of Care (Signed)
Pt tearful this shift, especially when she had procedures completed such as CBG and blood draws. Pt went longer than 4 hours w/o asking for pain meds this shift.

## 2018-01-06 NOTE — Plan of Care (Signed)
Discharge instructions reviewed with patient, questions answered, verbalized understanding.  Stressed to patient the importance of following up with PCP in one week for labs as well as being compliant with all medications.  Patient verbalizes understanding of this.  Patient taken to main entrance of hospital to be taken home by significant other.

## 2018-01-06 NOTE — Discharge Summary (Signed)
Physician Discharge Summary  Linda DarnerJarryl D Vaughn ZOX:[id number] DOB: [birth year]  PCP: Mike Gipouglas, Andre, FNP  Admit date: 01/03/2018 Discharge date: 01/06/2018  Recommendations for Outpatient Follow-up:  1. Mike Gipouglas, Andre, FNP/new PCP at [facility].  Patient has to go to the office to make an appointment to be seen in 1 week with repeat labs (CBC & CMP). 2. Dr. Kerin SalenArya Karki, [facility] in 2 weeks.  Home Health: None Equipment/Devices: None  Discharge Condition: Improved and stable CODE STATUS: Full Diet recommendation: Heart healthy diet.  Sodium and fluid restriction has been counseled.  Discharge Diagnoses:  Active Problems:   Ascites   Hypertension   Hepatitis C   Thrombocytopenia (HCC)   Decompensated hepatic cirrhosis (HCC)   Brief Summary: 53 year old female with PMH of cirrhosis secondary to hepatitis C and alcoholism complicated by recurrent ascites/SBP, HTN, macrocytic anemia, thrombocytopenia, umbilical hernia, ongoing tobacco and alcohol use, [facility] ED on 01/03/2018 with complaints of abdominal distention, pain, nausea and nonbloody emesis for a day prior to admission.  Reports compliance with Lasix but stopped taking Aldactone.  Admitted for recurrent symptomatic ascites.  Ascitic fluid not consistent with SBP.  Paracentesis 12/30: 6 L 12/22: 6.25 L 12/8: 7 L 11/21: 6.7 L 11/14: 5.7 L 11/5: 6.7 L.  Assessment & Plan:  1. Cirrhosis of liver (hepatitis C and alcoholism) with recurrent ascites: IR was consulted and she underwent 6 L therapeutic paracentesis on 12/30.  Ascitic fluid: 112 WBCs, 78 monocytes, 19 lymphocytes and 3 neutrophils.  Not consistent with SBP.  Discontinued empirically started IV ceftriaxone.  She received albumin infusion. Counseled extensively regarding compliance with alcohol cessation and medication compliance.  She verbalized understanding.  Patient reports that she does not have a gastroenterologist  in [place], [facility] with [facility] who is planning hernia surgery and reportedly a colonoscopy on 01/19/2018.  She states that she cannot afford to travel out of town for a gastroenterologist.  She was released from [facility] due to missing appointments.  She was agreeable to see a new gastroenterologist.  [facility] consulted and input appreciated.  Increased diuretics to Lasix 80 mg twice daily and Aldactone 100 mg daily and will need outpatient follow-up regarding EGD for variceal screening and colonoscopy for colon cancer screening.    Improved and stable.  Insistent on going home.  I discussed with [facility] MD on call, cleared for discharge home on current dose of diuretics, office will coordinate follow-up, advised to give a short supply of meds in case she does not keep up her appointments due to need for close monitoring of her BMP while on high-dose diuretics. 2. Macrocytic anemia: Likely related to chronic disease, alcoholism and cirrhosis.  Stable.  No bleeding reported. 3. Thrombocytopenia: Related to cirrhosis.  Stable. 4. Alcohol dependence: Patient reports that she does not drink as much as before and her last drink was a beer on Christmas day.    No overt withdrawal symptoms.  Continue vitamin supplements. 5. Essential hypertension: Not on medications PTA.  Controlled. 6. Ventral hernia: As per CT abdomen with pelvis report 12/30: Patient has large multiloculated fluid collection within a ventral abdominal hernia measuring 13 x 7 x 8 0.2 x 17.3 cm favored to be chronic ensisted ascites.  Outpatient follow[facility] with [facility]. 7. Hypokalemia: Replaced.  Magnesium normal. 8. Polysubstance abuse (cocaine and THC): UDS positive for opiates, cocaine and THC.  Patient volunteers to smoking THC  but denies cocaine use and does not know how it got in her system.   Consultants:  Interventional radiology [facility].  Procedures:  Therapeutic paracentesis 6 L by IR under  ultrasound guidance on 12/30   Discharge Instructions  Discharge Instructions    Call MD for:  difficulty breathing, headache or visual disturbances   Complete by:  As directed    Call MD for:  extreme fatigue   Complete by:  As directed    Call MD for:  persistant dizziness or light-headedness   Complete by:  As directed    Call MD for:  persistant nausea and vomiting   Complete by:  As directed    Call MD for:  severe uncontrolled pain   Complete by:  As directed    Call MD for:  temperature >100.4   Complete by:  As directed    Diet - low sodium heart healthy   Complete by:  As directed    Increase activity slowly   Complete by:  As directed        Medication List    STOP taking these medications   magnesium oxide 400 (241.3 Mg) MG tablet Commonly known as:  MAG-OX   potassium chloride SA 20 MEQ tablet Commonly known as:  K-DUR,KLOR-CON     TAKE these medications   feeding supplement (ENSURE ENLIVE) Liqd Take 237 mLs by mouth daily.   folic acid 1 MG tablet Commonly known as:  FOLVITE Take 1 tablet (1 mg total) by mouth daily.   furosemide 40 MG tablet Commonly known as:  LASIX Take 2 tablets (80 mg total) by mouth 2 (two) times daily. What changed:  how much to take   lactulose 10 GM/15ML solution Commonly known as:  CHRONULAC Take 45 mLs (30 g total) by mouth 2 (two) times daily.   multivitamin with minerals Tabs tablet Take 1 tablet by mouth daily.   spironolactone 100 MG tablet Commonly known as:  ALDACTONE Take 1 tablet (100 mg total) by mouth daily. What changed:  medication strength   Vitamin B1 50 MG Tabs Take 50 mg by mouth daily.      Follow-up Information    Mike Gip, FNP Follow up.   Specialty:  Family Medicine Why:  Go into office to make appointment.  To be seen in 1 week with repeat labs (CBC & CMP). Contact information: [address] [place] [address] [phone]        Kerin Salen, MD. Schedule an  appointment as soon as possible for a visit in 2 week(s).   Specialty:  Gastroenterology Why:  Hospital follow-up.  Call for appointment. Contact information: [address] [place] [address] [phone]          Allergies  Allergen Reactions  . Aspirin Other (See Comments)    Liver damage   . Ibuprofen Other (See Comments)    Liver damage  . Tylenol [Acetaminophen] Other (See Comments)    Liver damage      Procedures/Studies:  Dg Abdomen 1 View  Result Date: 01/04/2018 CLINICAL DATA:  Abdominal distension EXAM: ABDOMEN - 1 VIEW COMPARISON:  None. FINDINGS: There are mildly dilated loops of small bowel in the central abdomen. There is an incompletely visualized right pleural effusion. Status post cholecystectomy. No acute osseous abnormality. IMPRESSION: Mildly dilated central abdominal small bowel loops. Consider CT of the abdomen and pelvis if there is concern for small bowel obstruction. Electronically Signed  By: Deatra RobinsonKevin  Herman M.D.   On: 01/04/2018 01:05   Ct Abdomen Pelvis W Contrast  Result Date: 01/04/2018 CLINICAL DATA:  Abdominal pain and distension. Umbilical hernia. EXAM: CT ABDOMEN AND PELVIS WITH CONTRAST TECHNIQUE: Multidetector CT imaging of the abdomen and pelvis was performed using the standard protocol following bolus administration of intravenous contrast. CONTRAST:  100mL ISOVUE-300 IOPAMIDOL (ISOVUE-300) INJECTION 61% COMPARISON:  CT abdomen pelvis 12/13/2017 FINDINGS: LOWER CHEST: Small right pleural effusion with associated atelectasis. HEPATOBILIARY: Diffusely nodular hepatic contours with overall shrunken size, consistent with hepatic cirrhosis. No focal liver lesion. No biliary dilatation. Status post cholecystectomy. There is a large amount of ascites throughout the abdomen and pelvis. PANCREAS: The pancreatic parenchymal contours are normal and there is no ductal dilatation. There is no peripancreatic fluid collection. SPLEEN: Normal.  ADRENALS/URINARY TRACT: --Adrenal glands: Normal. --Right kidney/ureter: No hydronephrosis, nephroureterolithiasis, perinephric stranding or solid renal mass. --Left kidney/ureter: No hydronephrosis, nephroureterolithiasis, perinephric stranding or solid renal mass. --Urinary bladder: Normal for degree of distention STOMACH/BOWEL: --Stomach/Duodenum: There is no hiatal hernia or other gastric abnormality. The duodenal course and caliber are normal. --Small bowel: No dilatation or inflammation. --Colon: No focal abnormality. --Appendix: Normal. VASCULAR/LYMPHATIC: There is aortic atherosclerosis without hemodynamically significant stenosis. The portal vein, splenic vein, superior mesenteric vein and IVC are patent. No abdominal or pelvic lymphadenopathy. REPRODUCTIVE: Normal uterus and ovaries. MUSCULOSKELETAL. No bony spinal canal stenosis or focal osseous abnormality. OTHER: There is a multiloculated fluid collection within a ventral abdominal hernia, measuring approximately 13.7 x 8.2 x 17.3 cm. IMPRESSION: 1. Large multiloculated fluid collection within a ventral abdominal hernia, measuring 13.7 x 8.2 x 17.3 cm, favored to be chronic encysted ascites. 2. Hepatic cirrhosis and large volume ascites. 3. Small right pleural effusion with associated atelectasis. Electronically Signed   By: Deatra RobinsonKevin  Herman M.D.   On: 01/04/2018 03:10   [place] Paracentesis  Result Date: 01/04/2018 INDICATION: Patient with history of hepatitis-C, cirrhosis, recurrent ascites. Request made for diagnostic and therapeutic paracentesis. EXAM: ULTRASOUND GUIDED DIAGNOSTIC AND THERAPEUTIC PARACENTESIS MEDICATIONS: None COMPLICATIONS: None immediate. PROCEDURE: Informed written consent was obtained from the patient after a discussion of the risks, benefits and alternatives to treatment. A timeout was performed prior to the initiation of the procedure. Initial ultrasound scanning demonstrates a large amount of ascites within the left lower  abdominal quadrant. The left lower abdomen was prepped and draped in the usual sterile fashion. 1% lidocaine was used for local anesthesia. Following this, a 19 gauge, 10-cm, Yueh catheter was introduced. An ultrasound image was saved for documentation purposes. The paracentesis was performed. The catheter was removed and a dressing was applied. The patient tolerated the procedure well without immediate post procedural complication. FINDINGS: A total of approximately 6 liters of yellow fluid was removed. Samples were sent to the laboratory as requested by the clinical team. IMPRESSION: Successful ultrasound-guided diagnostic and therapeutic paracentesis yielding 6 liters of peritoneal fluid. Read by: Jeananne RamaKevin Allred, PA-C Electronically Signed   By: Simonne ComeJohn  Watts M.D.   On: 01/04/2018 11:18   Subjective: Patient insisting on being discharged.  Denies complaints.  No pain reported.  No nausea or vomiting.  Tolerating diet.  Discharge Exam:  Vitals:   01/05/18 1352 01/05/18 2234 01/06/18 0710 01/06/18 0712  BP: 120/80 121/83  112/62  Pulse: 71 86  75  Resp: 18 18  18   Temp: 98.2 F (36.8 C) 99.7 F (37.6 C)  98.7 F (37.1 C)  TempSrc: Oral Oral    SpO2: 95% 96%  100%  Weight:   95.6 kg   Height:        General exam: Middle-aged female, moderately built and nourished, sitting up comfortably in bed eating breakfast this morning. Respiratory system: Slightly diminished breath sounds in the bases but otherwise clear to auscultation. Respiratory effort normal. Cardiovascular system: S1 & S2 heard, RRR. No JVD, murmurs, rubs, gallops or clicks. No pedal edema.  Telemetry personally reviewed: Sinus rhythm.   Gastrointestinal system: Abdomen distention has improved/moderate distention now, soft and nontender.  Has multiloculated ventral hernia which is partially reducible and has no acute findings i.e. redness and in fact that area is not tender.  Normal bowel sounds heard.  No organomegaly appreciated.   Ascites + +. Central nervous system: Alert and oriented. No focal neurological deficits.  No asterixis. Extremities: Symmetric 5 x 5 power. Skin: No rashes, lesions or ulcers Psychiatry: Judgement and insight appear normal. Mood & affect appropriate.     The results of significant diagnostics from this hospitalization (including imaging, microbiology, ancillary and laboratory) are listed below for reference.     Microbiology: Recent Results (from the past 240 hour(s))  Body fluid culture     Status: None   Collection Time: 12/27/17  1:16 PM  Result Value Ref Range Status   Specimen Description PERITONEAL  Final   Special Requests NONE  Final   Culture   Final    DUPLICTE ORDER PLEASE SEE BODY FLUID BOTTLE CULTURE Performed at [facility], [address], [place], [place] [address]    Report Status 12/28/2017 FINAL  Final  Culture, body fluid-bottle     Status: None   Collection Time: 12/27/17  1:16 PM  Result Value Ref Range Status   Specimen Description PERITONEAL  Final   Special Requests NONE  Final   Culture   Final    NO GROWTH 5 DAYS Performed at [facility], [address], [place], [place] [address]    Report Status 01/01/2018 FINAL  Final  Gram stain     Status: None   Collection Time: 12/27/17  1:16 PM  Result Value Ref Range Status   Specimen Description PERITONEAL  Final   Special Requests NONE  Final   Gram Stain   Final    NO WBC SEEN NO ORGANISMS SEEN CYTOSPIN SMEAR Performed at [facility], [address] [place], [place] [address]    Report Status 12/28/2017 FINAL  Final  Body fluid culture     Status: None (Preliminary result)   Collection Time: 01/04/18 10:39 AM  Result Value Ref Range Status   Specimen Description   Final    Peritoneal Performed at [facility], [address] [place], [place] [address]    Special Requests   Final    NONE Performed at [facility], [address], [place], [place] [address]    Gram Stain   Final    WBC PRESENT, PREDOMINANTLY MONONUCLEAR NO ORGANISMS SEEN CYTOSPIN SMEAR    Culture   Final    NO GROWTH 2 DAYS Performed at [facility], [address] [place], [place] [address]    Report Status PENDING  Incomplete     Labs: CBC: Recent Labs  Lab 01/03/18 2246 01/04/18 0740 01/05/18 0515 01/06/18 0512  WBC 4.6 4.5 4.4 4.7  NEUTROABS 1.7  --   --   --   HGB 11.7* 11.7* 11.1* 11.3*  HCT 35.5* 35.1* 34.4* 34.5*  MCV 105.3* 105.4* 109.9* 108.8*  PLT 135* 134* 128* 135*   Basic Metabolic Panel: Recent Labs  Lab 01/03/18 2246 01/04/18 0740 01/04/18 1537 01/05/18 0515 01/06/18 0512  NA 138 136  --  135 136  K 3.0* 3.1*  --  3.9 4.3  CL 106 104  --  105 104  CO2 25 25  --  24 26  GLUCOSE 97 106*  --  116* 92  BUN 11 11  --  10 10  CREATININE 0.98 0.82  --  0.84 0.84  CALCIUM 7.7* 7.8*  --  8.1* 8.2*  MG  --   --  1.8  --   --    Liver Function Tests: Recent Labs  Lab 01/03/18 2246  AST 53*  ALT 20  ALKPHOS 142*  BILITOT 2.0*  PROT 6.9  ALBUMIN 1.9*   CBG: Recent Labs  Lab 01/05/18 2238  GLUCAP 92   Anemia work up Recent Labs    01/04/18 0740  VITAMINB12 1,166*  FOLATE 16.8  FERRITIN 321*  TIBC 180*  IRON 150  RETICCTPCT 3.7*   Urinalysis    Component Value Date/Time   COLORURINE AMBER (A) 01/05/2018 1146   APPEARANCEUR HAZY (A) 01/05/2018 1146   LABSPEC 1.027 01/05/2018 1146   PHURINE 6.0 01/05/2018 1146   GLUCOSEU NEGATIVE 01/05/2018 1146   HGBUR NEGATIVE 01/05/2018 1146   BILIRUBINUR NEGATIVE 01/05/2018 1146   BILIRUBINUR negative 06/11/2017 1139   KETONESUR 5 (A) 01/05/2018 1146   PROTEINUR NEGATIVE 01/05/2018 1146   UROBILINOGEN 0.2 06/11/2017 1139   UROBILINOGEN 4.0 (H) 03/16/2017 1041   NITRITE NEGATIVE 01/05/2018 1146   LEUKOCYTESUR NEGATIVE 01/05/2018 1146      Time coordinating discharge: 40 minutes  SIGNED:  Marcellus Scott, MD, FACP, [facility]. [facility] Pager [phone]  If 7PM-7AM, please contact night-coverage [email or web address] Password [email or web address] 01/06/2018, 11:18 AM

## 2018-01-06 NOTE — Discharge Instructions (Signed)

## 2018-01-08 LAB — BODY FLUID CULTURE: Culture: NO GROWTH

## 2018-01-12 ENCOUNTER — Encounter (HOSPITAL_COMMUNITY): Payer: Self-pay | Admitting: *Deleted

## 2018-01-12 ENCOUNTER — Emergency Department (HOSPITAL_COMMUNITY): Payer: Medicaid Other

## 2018-01-12 ENCOUNTER — Emergency Department (HOSPITAL_COMMUNITY)
Admission: EM | Admit: 2018-01-12 | Discharge: 2018-01-12 | Disposition: A | Discharge status: Home / Self Care | Payer: Medicaid Other | Attending: Emergency Medicine | Admitting: Emergency Medicine

## 2018-01-12 DIAGNOSIS — F17200 Nicotine dependence, unspecified, uncomplicated: Secondary | ICD-10-CM | POA: Diagnosis not present

## 2018-01-12 DIAGNOSIS — R140 Abdominal distension (gaseous): Secondary | ICD-10-CM

## 2018-01-12 DIAGNOSIS — I10 Essential (primary) hypertension: Secondary | ICD-10-CM | POA: Diagnosis not present

## 2018-01-12 DIAGNOSIS — Z79899 Other long term (current) drug therapy: Secondary | ICD-10-CM | POA: Insufficient documentation

## 2018-01-12 DIAGNOSIS — K7031 Alcoholic cirrhosis of liver with ascites: Secondary | ICD-10-CM | POA: Diagnosis not present

## 2018-01-12 LAB — CBC
HCT: 38.6 % (ref 36.0–46.0)
Hemoglobin: 12.9 g/dL (ref 12.0–15.0)
MCH: 35 pg — ABNORMAL HIGH (ref 26.0–34.0)
MCHC: 33.4 g/dL (ref 30.0–36.0)
MCV: 104.6 fL — ABNORMAL HIGH (ref 80.0–100.0)
NRBC: 0 % (ref 0.0–0.2)
Platelets: 141 10*3/uL — ABNORMAL LOW (ref 150–400)
RBC: 3.69 MIL/uL — AB (ref 3.87–5.11)
RDW: 15.7 % — ABNORMAL HIGH (ref 11.5–15.5)
WBC: 3.5 10*3/uL — ABNORMAL LOW (ref 4.0–10.5)

## 2018-01-12 LAB — URINALYSIS, ROUTINE W REFLEX MICROSCOPIC
BILIRUBIN URINE: NEGATIVE
Glucose, UA: NEGATIVE mg/dL
Hgb urine dipstick: NEGATIVE
Ketones, ur: 5 mg/dL — AB
Nitrite: NEGATIVE
Protein, ur: NEGATIVE mg/dL
Specific Gravity, Urine: 1.021 (ref 1.005–1.030)
pH: 6 (ref 5.0–8.0)

## 2018-01-12 LAB — COMPREHENSIVE METABOLIC PANEL
ALT: 25 U/L (ref 0–44)
AST: 52 U/L — ABNORMAL HIGH (ref 15–41)
Albumin: 2.4 g/dL — ABNORMAL LOW (ref 3.5–5.0)
Alkaline Phosphatase: 132 U/L — ABNORMAL HIGH (ref 38–126)
Anion gap: 8 (ref 5–15)
BUN: 11 mg/dL (ref 6–20)
CHLORIDE: 102 mmol/L (ref 98–111)
CO2: 26 mmol/L (ref 22–32)
Calcium: 8 mg/dL — ABNORMAL LOW (ref 8.9–10.3)
Creatinine, Ser: 1 mg/dL (ref 0.44–1.00)
GFR calc Af Amer: >60 mL/min (ref >60)
GFR calc non Af Amer: >60 mL/min (ref >60)
Glucose, Bld: 92 mg/dL (ref 70–99)
Potassium: 3.4 mmol/L — ABNORMAL LOW (ref 3.5–5.1)
Sodium: 136 mmol/L (ref 135–145)
Total Bilirubin: 2.7 mg/dL — ABNORMAL HIGH (ref 0.3–1.2)
Total Protein: 7.4 g/dL (ref 6.5–8.1)

## 2018-01-12 LAB — BODY FLUID CELL COUNT WITH DIFFERENTIAL
Eos, Fluid: 0 %
Lymphs, Fluid: 39 %
MONOCYTE-MACROPHAGE-SEROUS FLUID: 58 % (ref 50–90)
NEUTROPHIL FLUID: 3 % (ref 0–25)
Total Nucleated Cell Count, Fluid: 126 cu mm (ref 0–1000)

## 2018-01-12 LAB — GRAM STAIN

## 2018-01-12 LAB — I-STAT BETA HCG BLOOD, ED (MC, WL, AP ONLY): I-stat hCG, quantitative: <5 m[IU]/mL (ref <5)

## 2018-01-12 LAB — LIPASE, BLOOD: LIPASE: 33 U/L (ref 11–51)

## 2018-01-12 MED ORDER — ONDANSETRON HCL 4 MG/2ML IJ SOLN
4.0000 mg | Freq: Once | INTRAMUSCULAR | Status: AC
  Administered 2018-01-12: 4 mg via INTRAVENOUS
  Filled 2018-01-12: qty 2

## 2018-01-12 MED ORDER — ALBUMIN HUMAN 5 % IV SOLN
12.5000 g | Freq: Once | INTRAVENOUS | Status: AC
  Administered 2018-01-12: 12.5 g via INTRAVENOUS
  Filled 2018-01-12: qty 250

## 2018-01-12 MED ORDER — MORPHINE SULFATE (PF) 4 MG/ML IV SOLN
4.0000 mg | Freq: Once | INTRAVENOUS | Status: AC
  Administered 2018-01-12: 4 mg via INTRAVENOUS
  Filled 2018-01-12: qty 1

## 2018-01-12 MED ORDER — LIDOCAINE HCL 1 % IJ SOLN
INTRAMUSCULAR | Status: AC
  Filled 2018-01-12: qty 20

## 2018-01-12 NOTE — ED Notes (Signed)
Per pharmacy I can increase rate from 60 ml/hr to 120 ml/hr.

## 2018-01-12 NOTE — Procedures (Signed)
Ultrasound-guided diagnostic and therapeutic paracentesis performed yielding 6.3 liters of yellow fluid. No immediate complications.  A portion of the fluid was submitted to the lab for preordered studies. EBL none.
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
## 2018-01-12 NOTE — ED Notes (Signed)
Patient transported to US 

## 2018-01-12 NOTE — ED Triage Notes (Signed)
Per EMS-patient has 2 large abdominal hernia's which collect fluid-patient requires a paracentesis Q2 weeks-has appointment in [facility] in 2 weeks for a colonoscopy-abdominal distention, no SOB at this time

## 2018-01-12 NOTE — Discharge Instructions (Addendum)
You have been diagnosed today with distended abdomen due to ascites.  At this time there does not appear to be the presence of an emergent medical condition, however there is always the potential for conditions to change. Please read and follow the below instructions.  Please return to the Emergency Department immediately for any new or worsening symptoms or if your symptoms return. Please be sure to follow up with your Primary Care Provider this week regarding your visit today; please call their office to schedule an appointment even if you are feeling better for a follow-up visit. Please follow-up with your regular scheduled paracenteses at your primary clinic.  Get help right away if: You have a fever. You are confused. You have new or worsening breathing trouble. You have new or worsening pain in your abdomen. Get help right away if: You develop chest pain or shortness of breath. You develop increasing pain, discomfort, or swelling in your abdomen. You feel dizzy or light-headed or you pass out.  Please read the additional information packets attached to your discharge summary.  Do not take your medicine if  develop an itchy rash, swelling in your mouth or lips, or difficulty breathing.

## 2018-01-12 NOTE — ED Notes (Signed)
Pt stated that she cannot urinate. 

## 2018-01-13 LAB — PATHOLOGIST SMEAR REVIEW

## 2018-01-17 LAB — CULTURE, BODY FLUID W GRAM STAIN -BOTTLE: Culture: NO GROWTH

## 2018-01-20 ENCOUNTER — Ambulatory Visit (INDEPENDENT_AMBULATORY_CARE_PROVIDER_SITE_OTHER): Payer: Medicaid Other | Admitting: Family Medicine

## 2018-01-20 ENCOUNTER — Encounter (HOSPITAL_COMMUNITY): Payer: Self-pay | Admitting: *Deleted

## 2018-01-20 ENCOUNTER — Other Ambulatory Visit: Payer: Self-pay

## 2018-01-20 ENCOUNTER — Emergency Department (HOSPITAL_COMMUNITY)
Admission: EM | Admit: 2018-01-20 | Discharge: 2018-01-20 | Disposition: A | Discharge status: Home / Self Care | Payer: Medicaid Other | Attending: Emergency Medicine | Admitting: Emergency Medicine

## 2018-01-20 VITALS — BP 123/76 | HR 98 | Temp 97.9°F | Resp 16 | Ht 60.0 in | Wt 198.0 lb

## 2018-01-20 DIAGNOSIS — R1084 Generalized abdominal pain: Secondary | ICD-10-CM

## 2018-01-20 DIAGNOSIS — Z79899 Other long term (current) drug therapy: Secondary | ICD-10-CM | POA: Insufficient documentation

## 2018-01-20 DIAGNOSIS — K7460 Unspecified cirrhosis of liver: Secondary | ICD-10-CM | POA: Diagnosis not present

## 2018-01-20 DIAGNOSIS — F17200 Nicotine dependence, unspecified, uncomplicated: Secondary | ICD-10-CM | POA: Diagnosis not present

## 2018-01-20 DIAGNOSIS — R188 Other ascites: Secondary | ICD-10-CM | POA: Insufficient documentation

## 2018-01-20 DIAGNOSIS — I10 Essential (primary) hypertension: Secondary | ICD-10-CM | POA: Insufficient documentation

## 2018-01-20 LAB — COMPREHENSIVE METABOLIC PANEL
ALK PHOS: 132 U/L — AB (ref 38–126)
ALT: 27 U/L (ref 0–44)
AST: 60 U/L — ABNORMAL HIGH (ref 15–41)
Albumin: 2.4 g/dL — ABNORMAL LOW (ref 3.5–5.0)
Anion gap: 8 (ref 5–15)
BUN: 10 mg/dL (ref 6–20)
CO2: 23 mmol/L (ref 22–32)
Calcium: 8.5 mg/dL — ABNORMAL LOW (ref 8.9–10.3)
Chloride: 103 mmol/L (ref 98–111)
Creatinine, Ser: 0.89 mg/dL (ref 0.44–1.00)
GFR calc Af Amer: >60 mL/min (ref >60)
GFR calc non Af Amer: >60 mL/min (ref >60)
Glucose, Bld: 113 mg/dL — ABNORMAL HIGH (ref 70–99)
Potassium: 3.5 mmol/L (ref 3.5–5.1)
SODIUM: 134 mmol/L — AB (ref 135–145)
Total Bilirubin: 3.1 mg/dL — ABNORMAL HIGH (ref 0.3–1.2)
Total Protein: 8.1 g/dL (ref 6.5–8.1)

## 2018-01-20 LAB — CBC
HCT: 38.8 % (ref 36.0–46.0)
Hemoglobin: 12.9 g/dL (ref 12.0–15.0)
MCH: 35.5 pg — AB (ref 26.0–34.0)
MCHC: 33.2 g/dL (ref 30.0–36.0)
MCV: 106.9 fL — AB (ref 80.0–100.0)
Platelets: 141 10*3/uL — ABNORMAL LOW (ref 150–400)
RBC: 3.63 MIL/uL — ABNORMAL LOW (ref 3.87–5.11)
RDW: 14.7 % (ref 11.5–15.5)
WBC: 4.2 10*3/uL (ref 4.0–10.5)
nRBC: 0 % (ref 0.0–0.2)

## 2018-01-20 LAB — I-STAT BETA HCG BLOOD, ED (MC, WL, AP ONLY): I-stat hCG, quantitative: <5 m[IU]/mL (ref <5)

## 2018-01-20 LAB — LIPASE, BLOOD: Lipase: 31 U/L (ref 11–51)

## 2018-01-20 MED ORDER — MORPHINE SULFATE 15 MG PO TABS: 15.0000 mg | ORAL_TABLET | Freq: Four times a day (QID) | ORAL | 0 refills | Status: AC | PRN

## 2018-01-20 MED ORDER — SODIUM CHLORIDE 0.9% FLUSH: 3.0000 mL | Freq: Once | INTRAVENOUS | Status: DC

## 2018-01-20 MED ORDER — HYDROMORPHONE HCL 1 MG/ML IJ SOLN
0.5000 mg | Freq: Once | INTRAMUSCULAR | Status: AC
  Administered 2018-01-20: 0.5 mg via SUBCUTANEOUS
  Filled 2018-01-20: qty 1

## 2018-01-20 NOTE — ED Triage Notes (Signed)
Per EMS pt coming from home with a c/o abdominal pain, reports having hernia with fluid  Build-up and pain due to the same, per EMS pt had colonoscopy yesterday.
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
## 2018-01-20 NOTE — ED Notes (Signed)
Pt sts [facility] is [facility] to see if she can get seen there

## 2018-01-20 NOTE — Discharge Instructions (Addendum)
Interventional radiology will call you tomorrow morning to set up your appointment for your paracentesis. While there, you should discuss how to set up regular paracenteses. Return to the ER with any new, worsening, or concerning symptoms.

## 2018-01-20 NOTE — ED Notes (Signed)
Patient called for triage x1 with no answer. 

## 2018-01-21 ENCOUNTER — Ambulatory Visit (HOSPITAL_COMMUNITY): Payer: Medicaid Other | Attending: Family Medicine

## 2018-01-21 ENCOUNTER — Other Ambulatory Visit: Payer: Self-pay | Admitting: Family Medicine

## 2018-01-21 DIAGNOSIS — R188 Other ascites: Secondary | ICD-10-CM

## 2018-01-21 NOTE — Progress Notes (Signed)
Linda Vaughn in u/s department requesting order to be placed as outpatient order. Patient was seen in the ED 01/20/2018

## 2018-01-24 ENCOUNTER — Emergency Department (HOSPITAL_COMMUNITY)
Admission: EM | Admit: 2018-01-24 | Discharge: 2018-01-24 | Disposition: A | Discharge status: Home / Self Care | Payer: Medicaid Other | Attending: Emergency Medicine | Admitting: Emergency Medicine

## 2018-01-24 DIAGNOSIS — R1033 Periumbilical pain: Secondary | ICD-10-CM | POA: Insufficient documentation

## 2018-01-24 DIAGNOSIS — I10 Essential (primary) hypertension: Secondary | ICD-10-CM | POA: Diagnosis not present

## 2018-01-24 DIAGNOSIS — R109 Unspecified abdominal pain: Secondary | ICD-10-CM

## 2018-01-24 DIAGNOSIS — Z79899 Other long term (current) drug therapy: Secondary | ICD-10-CM | POA: Insufficient documentation

## 2018-01-24 DIAGNOSIS — F17200 Nicotine dependence, unspecified, uncomplicated: Secondary | ICD-10-CM | POA: Insufficient documentation

## 2018-01-24 DIAGNOSIS — R188 Other ascites: Secondary | ICD-10-CM | POA: Insufficient documentation

## 2018-01-24 MED ORDER — LIDOCAINE-EPINEPHRINE (PF) 2 %-1:200000 IJ SOLN
20.0000 mL | Freq: Once | INTRAMUSCULAR | Status: AC
  Administered 2018-01-24: 20 mL
  Filled 2018-01-24: qty 20

## 2018-01-24 NOTE — ED Notes (Signed)
Bed: WK46 Expected date:  Expected time:  Means of arrival:  Comments: 17F abdominal pain- umbilical hernia
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
## 2018-01-24 NOTE — ED Triage Notes (Signed)
Pt is presenting from home via EMS with c/o abdominal pain with associated N/V that she is linking it to hernia and liver problems history.

## 2018-01-25 LAB — GRAM STAIN

## 2018-01-28 NOTE — Progress Notes (Signed)
Patient left without being seen.

## 2018-01-30 LAB — CULTURE, BODY FLUID W GRAM STAIN -BOTTLE: Culture: NO GROWTH

## 2018-02-02 ENCOUNTER — Emergency Department (HOSPITAL_COMMUNITY)
Admission: EM | Admit: 2018-02-02 | Discharge: 2018-02-02 | Disposition: A | Discharge status: Home / Self Care | Payer: Medicaid Other | Source: Home / Self Care | Attending: Emergency Medicine | Admitting: Emergency Medicine

## 2018-02-02 ENCOUNTER — Encounter (HOSPITAL_COMMUNITY): Payer: Self-pay | Admitting: Emergency Medicine

## 2018-02-02 ENCOUNTER — Encounter (HOSPITAL_COMMUNITY): Payer: Self-pay

## 2018-02-02 ENCOUNTER — Other Ambulatory Visit: Payer: Self-pay

## 2018-02-02 ENCOUNTER — Emergency Department (HOSPITAL_COMMUNITY)
Admission: EM | Admit: 2018-02-02 | Discharge: 2018-02-02 | Disposition: A | Discharge status: Home / Self Care | Payer: Medicaid Other | Attending: Emergency Medicine | Admitting: Emergency Medicine

## 2018-02-02 ENCOUNTER — Emergency Department (HOSPITAL_COMMUNITY): Payer: Medicaid Other

## 2018-02-02 DIAGNOSIS — R109 Unspecified abdominal pain: Secondary | ICD-10-CM | POA: Diagnosis present

## 2018-02-02 DIAGNOSIS — Z79899 Other long term (current) drug therapy: Secondary | ICD-10-CM | POA: Diagnosis not present

## 2018-02-02 DIAGNOSIS — K7031 Alcoholic cirrhosis of liver with ascites: Secondary | ICD-10-CM | POA: Diagnosis not present

## 2018-02-02 DIAGNOSIS — I10 Essential (primary) hypertension: Secondary | ICD-10-CM | POA: Diagnosis not present

## 2018-02-02 DIAGNOSIS — F17210 Nicotine dependence, cigarettes, uncomplicated: Secondary | ICD-10-CM | POA: Diagnosis not present

## 2018-02-02 DIAGNOSIS — K7460 Unspecified cirrhosis of liver: Secondary | ICD-10-CM | POA: Insufficient documentation

## 2018-02-02 DIAGNOSIS — R188 Other ascites: Secondary | ICD-10-CM

## 2018-02-02 DIAGNOSIS — R1084 Generalized abdominal pain: Secondary | ICD-10-CM

## 2018-02-02 DIAGNOSIS — E876 Hypokalemia: Secondary | ICD-10-CM

## 2018-02-02 LAB — I-STAT BETA HCG BLOOD, ED (MC, WL, AP ONLY): I-stat hCG, quantitative: <5 m[IU]/mL (ref <5)

## 2018-02-02 LAB — COMPREHENSIVE METABOLIC PANEL
ALK PHOS: 176 U/L — AB (ref 38–126)
ALT: 29 U/L (ref 0–44)
AST: 63 U/L — ABNORMAL HIGH (ref 15–41)
Albumin: 2.3 g/dL — ABNORMAL LOW (ref 3.5–5.0)
Anion gap: 7 (ref 5–15)
BILIRUBIN TOTAL: 2.7 mg/dL — AB (ref 0.3–1.2)
BUN: 18 mg/dL (ref 6–20)
CO2: 28 mmol/L (ref 22–32)
Calcium: 8.3 mg/dL — ABNORMAL LOW (ref 8.9–10.3)
Chloride: 100 mmol/L (ref 98–111)
Creatinine, Ser: 1.06 mg/dL — ABNORMAL HIGH (ref 0.44–1.00)
GFR calc Af Amer: >60 mL/min (ref >60)
GFR calc non Af Amer: 59 mL/min — ABNORMAL LOW (ref >60)
Glucose, Bld: 105 mg/dL — ABNORMAL HIGH (ref 70–99)
Potassium: 2.7 mmol/L — CL (ref 3.5–5.1)
Sodium: 135 mmol/L (ref 135–145)
Total Protein: 8.1 g/dL (ref 6.5–8.1)

## 2018-02-02 LAB — CBC
HCT: 38.3 % (ref 36.0–46.0)
Hemoglobin: 13.1 g/dL (ref 12.0–15.0)
MCH: 34.7 pg — ABNORMAL HIGH (ref 26.0–34.0)
MCHC: 34.2 g/dL (ref 30.0–36.0)
MCV: 101.6 fL — ABNORMAL HIGH (ref 80.0–100.0)
Platelets: 134 10*3/uL — ABNORMAL LOW (ref 150–400)
RBC: 3.77 MIL/uL — ABNORMAL LOW (ref 3.87–5.11)
RDW: 14.1 % (ref 11.5–15.5)
WBC: 4 10*3/uL (ref 4.0–10.5)
nRBC: 0 % (ref 0.0–0.2)

## 2018-02-02 LAB — LIPASE, BLOOD: Lipase: 33 U/L (ref 11–51)

## 2018-02-02 LAB — MAGNESIUM: Magnesium: 2.1 mg/dL (ref 1.7–2.4)

## 2018-02-02 MED ORDER — POTASSIUM CHLORIDE CRYS ER 10 MEQ PO TBCR: 10.0000 meq | EXTENDED_RELEASE_TABLET | Freq: Every day | ORAL | 0 refills | Status: AC

## 2018-02-02 MED ORDER — LIDOCAINE HCL 1 % IJ SOLN
INTRAMUSCULAR | Status: AC
  Filled 2018-02-02: qty 20

## 2018-02-02 MED ORDER — POTASSIUM CHLORIDE CRYS ER 20 MEQ PO TBCR
40.0000 meq | EXTENDED_RELEASE_TABLET | Freq: Once | ORAL | Status: AC
  Administered 2018-02-02: 40 meq via ORAL
  Filled 2018-02-02: qty 2

## 2018-02-02 MED ORDER — PROMETHAZINE HCL 25 MG PO TABS: 25.0000 mg | ORAL_TABLET | Freq: Four times a day (QID) | ORAL | 0 refills | Status: AC | PRN

## 2018-02-02 MED ORDER — ONDANSETRON HCL 4 MG/2ML IJ SOLN
4.0000 mg | Freq: Once | INTRAMUSCULAR | Status: AC
  Administered 2018-02-02: 4 mg via INTRAVENOUS
  Filled 2018-02-02: qty 2

## 2018-02-02 MED ORDER — SODIUM CHLORIDE 0.9% FLUSH
3.0000 mL | Freq: Once | INTRAVENOUS | Status: AC
  Administered 2018-02-02: 3 mL via INTRAVENOUS

## 2018-02-02 MED ORDER — MORPHINE SULFATE (PF) 4 MG/ML IV SOLN
4.0000 mg | Freq: Once | INTRAVENOUS | Status: AC
  Administered 2018-02-02: 4 mg via INTRAVENOUS
  Filled 2018-02-02: qty 1

## 2018-02-02 MED ORDER — POTASSIUM CHLORIDE 10 MEQ/100ML IV SOLN
10.0000 meq | INTRAVENOUS | Status: AC
  Administered 2018-02-02 (×2): 10 meq via INTRAVENOUS
  Filled 2018-02-02 (×3): qty 100

## 2018-02-02 NOTE — ED Notes (Signed)
Patient refusing phenergan prescription stating it makes her feel bad. Patient stating she will be back in the morning to be drained. Patient refusing d/c vitals. Patient ambulatory in NAD.

## 2018-02-02 NOTE — ED Triage Notes (Signed)
Pt complains of abdominal pain from a hernia and she regularly gets fluid taken off here at [facility] Last time was three weeks ago and she had 7 Liters drained off Pts abdomen is distended at present

## 2018-02-02 NOTE — ED Notes (Signed)
Upon entering room to discharge patient, patient upset about being discharged. Patient stating that she is in pain, dizzy, and unable to tolerate PO. Patient stating that it is "inappropriate and unprofessional." Discussed with patient's the doctor's opinions on the subject which upset patient more. Discussed with patient that she needs to see her GI doctor due to this being a GI specialty. Patient stating that her GI is in [facility] and that she needs immediate relief stating, "you can't send me home like this." Discussed with charge nurse situation. This Passenger transport manager both spoke with EDP about patient's concerns. No new orders received.

## 2018-02-02 NOTE — ED Notes (Signed)
Bed: WA21 Expected date:  Expected time:  Means of arrival:  Comments: 53 yr old abd pain

## 2018-02-02 NOTE — ED Triage Notes (Signed)
Per EMS, patient from home, c/o abdominal pain with N/V x12 hours. Requesting paracentesis. Seen today for same. Hx umbilical hernia, hep C and cirrhosis.
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
## 2018-02-02 NOTE — Discharge Instructions (Signed)
Eat a low sodium diet.  Continue diuretics. You need to schedule paracentesis with your GI doctor.  It is not appropriate to come to the ED to get this done on a regular basis.

## 2018-02-02 NOTE — ED Notes (Signed)
Spoke with patient stating that the EDP was still not going to drain her due to multiple concerns. Patient verbally louder and more angry. Patient continues to yell and then requests pain medication. Charge nurse spoke with EDP and phenergan prescription obtained.

## 2018-02-02 NOTE — Procedures (Signed)
Ultrasound-guided  therapeutic paracentesis performed yielding 6.1  liters of yellow fluid. No immediate complications. EBL none.
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
## 2018-02-02 NOTE — ED Notes (Signed)
Potassium 2.7 reported to RN at 1421

## 2018-02-02 NOTE — Discharge Instructions (Signed)
Resume your normal medications tomorrow and begin taking potassium tablets once daily I would like for you to have your potassium rechecked at your primary care appointment next week.  I am glad that your abdominal pain has improved after paracentesis please discuss with your primary care doctor what you need to do to get these scheduled regularly so that you do not have to continue coming through the emergency department.  Return to the emergency department if you have fevers, abdominal pain, persistent vomiting, or unable to pass stools or any other new or concerning symptoms occur.

## 2018-02-08 ENCOUNTER — Ambulatory Visit: Payer: Medicaid Other | Admitting: Family Medicine

## 2018-02-11 ENCOUNTER — Encounter (HOSPITAL_COMMUNITY): Payer: Self-pay | Admitting: Emergency Medicine

## 2018-02-11 ENCOUNTER — Emergency Department (HOSPITAL_COMMUNITY)
Admission: EM | Admit: 2018-02-11 | Discharge: 2018-02-11 | Disposition: A | Discharge status: Home / Self Care | Payer: Medicaid Other | Attending: Emergency Medicine | Admitting: Emergency Medicine

## 2018-02-11 ENCOUNTER — Other Ambulatory Visit: Payer: Self-pay

## 2018-02-11 ENCOUNTER — Telehealth: Payer: Self-pay | Admitting: Family Medicine

## 2018-02-11 DIAGNOSIS — F1490 Cocaine use, unspecified, uncomplicated: Secondary | ICD-10-CM | POA: Insufficient documentation

## 2018-02-11 DIAGNOSIS — F1010 Alcohol abuse, uncomplicated: Secondary | ICD-10-CM | POA: Diagnosis not present

## 2018-02-11 DIAGNOSIS — I10 Essential (primary) hypertension: Secondary | ICD-10-CM | POA: Insufficient documentation

## 2018-02-11 DIAGNOSIS — F329 Major depressive disorder, single episode, unspecified: Secondary | ICD-10-CM | POA: Diagnosis not present

## 2018-02-11 DIAGNOSIS — Z79899 Other long term (current) drug therapy: Secondary | ICD-10-CM | POA: Diagnosis not present

## 2018-02-11 DIAGNOSIS — Z9049 Acquired absence of other specified parts of digestive tract: Secondary | ICD-10-CM | POA: Diagnosis not present

## 2018-02-11 DIAGNOSIS — F17200 Nicotine dependence, unspecified, uncomplicated: Secondary | ICD-10-CM | POA: Insufficient documentation

## 2018-02-11 DIAGNOSIS — R109 Unspecified abdominal pain: Secondary | ICD-10-CM | POA: Diagnosis present

## 2018-02-11 DIAGNOSIS — K7031 Alcoholic cirrhosis of liver with ascites: Secondary | ICD-10-CM

## 2018-02-11 DIAGNOSIS — F1290 Cannabis use, unspecified, uncomplicated: Secondary | ICD-10-CM | POA: Insufficient documentation

## 2018-02-11 LAB — LIPASE, BLOOD: Lipase: 33 U/L (ref 11–51)

## 2018-02-11 LAB — COMPREHENSIVE METABOLIC PANEL
ALT: 23 U/L (ref 0–44)
AST: 57 U/L — ABNORMAL HIGH (ref 15–41)
Albumin: 1.7 g/dL — ABNORMAL LOW (ref 3.5–5.0)
Alkaline Phosphatase: 137 U/L — ABNORMAL HIGH (ref 38–126)
Anion gap: 8 (ref 5–15)
BUN: 12 mg/dL (ref 6–20)
CO2: 26 mmol/L (ref 22–32)
Calcium: 7.4 mg/dL — ABNORMAL LOW (ref 8.9–10.3)
Chloride: 101 mmol/L (ref 98–111)
Creatinine, Ser: 0.83 mg/dL (ref 0.44–1.00)
GFR calc Af Amer: >60 mL/min (ref >60)
GFR calc non Af Amer: >60 mL/min (ref >60)
Glucose, Bld: 91 mg/dL (ref 70–99)
Potassium: 2.6 mmol/L — CL (ref 3.5–5.1)
SODIUM: 135 mmol/L (ref 135–145)
Total Bilirubin: 2.9 mg/dL — ABNORMAL HIGH (ref 0.3–1.2)
Total Protein: 7.4 g/dL (ref 6.5–8.1)

## 2018-02-11 LAB — CBC
HCT: 35.3 % — ABNORMAL LOW (ref 36.0–46.0)
HEMOGLOBIN: 12.1 g/dL (ref 12.0–15.0)
MCH: 35.7 pg — ABNORMAL HIGH (ref 26.0–34.0)
MCHC: 34.3 g/dL (ref 30.0–36.0)
MCV: 104.1 fL — ABNORMAL HIGH (ref 80.0–100.0)
Platelets: 147 10*3/uL — ABNORMAL LOW (ref 150–400)
RBC: 3.39 MIL/uL — ABNORMAL LOW (ref 3.87–5.11)
RDW: 15.1 % (ref 11.5–15.5)
WBC: 6.4 10*3/uL (ref 4.0–10.5)
nRBC: 0 % (ref 0.0–0.2)

## 2018-02-11 MED ORDER — SODIUM CHLORIDE 0.9% FLUSH: 3.0000 mL | Freq: Once | INTRAVENOUS | Status: DC

## 2018-02-11 MED ORDER — TRAMADOL HCL 50 MG PO TABS
50.0000 mg | ORAL_TABLET | Freq: Once | ORAL | Status: AC
  Administered 2018-02-11: 50 mg via ORAL
  Filled 2018-02-11: qty 1

## 2018-02-11 MED ORDER — POTASSIUM CHLORIDE CRYS ER 20 MEQ PO TBCR
40.0000 meq | EXTENDED_RELEASE_TABLET | Freq: Once | ORAL | Status: AC
  Administered 2018-02-11: 40 meq via ORAL
  Filled 2018-02-11: qty 2

## 2018-02-11 NOTE — ED Triage Notes (Addendum)
Per EMS, patient from home, c/o abdominal pain and pain at site of umbilical hernia worsening today. Requesting paracentesis. Hx hep C and cirrhosis.  Bleeding noted at hernia site. Patient denies changes in bowel or bladder.
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
## 2018-02-11 NOTE — Telephone Encounter (Signed)
Patient is not a patient here but CMA called patient back and inform to go to the emergency room per patient stated she is having pain with her hernia that is bust through her skin. Pt. Was crying on the phone due to so much pain, CMA inform patient to please go to the nearest emergency department to get her hernia check.  Patient understood and agreed.

## 2018-02-11 NOTE — Telephone Encounter (Signed)
Pt states she is calling, wanting to speak with a nurse about her Hernia pain. Please f/u

## 2018-02-11 NOTE — Discharge Instructions (Addendum)
As discussed, with your recurrent episodes of fluid accumulation in your abdomen it is important you speak with your physician tomorrow, and take steps to ensure appropriate ongoing outpatient care.  Return here for concerning changes in your condition.

## 2018-02-11 NOTE — ED Notes (Signed)
Requested urine from patient. 

## 2018-02-11 NOTE — ED Notes (Signed)
Date and time results received: 02/11/18 7:46 PM  (use smartphrase ".now" to insert current time)  Test: K Critical Value: 2.6  Name of Provider Notified: Rubin Payor  Orders Received? Or Actions Taken?: see orders

## 2018-02-18 ENCOUNTER — Encounter (HOSPITAL_COMMUNITY): Payer: Self-pay | Admitting: Emergency Medicine

## 2018-02-18 ENCOUNTER — Emergency Department (HOSPITAL_COMMUNITY)
Admission: EM | Admit: 2018-02-18 | Discharge: 2018-02-18 | Discharge status: Against Medical Advice | Payer: Medicaid Other | Attending: Emergency Medicine | Admitting: Emergency Medicine

## 2018-02-18 ENCOUNTER — Other Ambulatory Visit: Payer: Self-pay

## 2018-02-18 ENCOUNTER — Other Ambulatory Visit: Payer: Self-pay | Admitting: Family Medicine

## 2018-02-18 ENCOUNTER — Emergency Department (HOSPITAL_COMMUNITY): Payer: Medicaid Other

## 2018-02-18 DIAGNOSIS — R1032 Left lower quadrant pain: Secondary | ICD-10-CM | POA: Diagnosis present

## 2018-02-18 DIAGNOSIS — K7031 Alcoholic cirrhosis of liver with ascites: Secondary | ICD-10-CM

## 2018-02-18 DIAGNOSIS — Z79899 Other long term (current) drug therapy: Secondary | ICD-10-CM | POA: Insufficient documentation

## 2018-02-18 DIAGNOSIS — F17200 Nicotine dependence, unspecified, uncomplicated: Secondary | ICD-10-CM | POA: Insufficient documentation

## 2018-02-18 DIAGNOSIS — I10 Essential (primary) hypertension: Secondary | ICD-10-CM

## 2018-02-18 DIAGNOSIS — R188 Other ascites: Secondary | ICD-10-CM | POA: Diagnosis not present

## 2018-02-18 LAB — I-STAT BETA HCG BLOOD, ED (MC, WL, AP ONLY): I-stat hCG, quantitative: <5 m[IU]/mL (ref <5)

## 2018-02-18 LAB — LIPASE, BLOOD: Lipase: 31 U/L (ref 11–51)

## 2018-02-18 LAB — BODY FLUID CELL COUNT WITH DIFFERENTIAL
LYMPHS FL: 21 %
Monocyte-Macrophage-Serous Fluid: 77 % (ref 50–90)
NEUTROPHIL FLUID: 2 % (ref 0–25)
Total Nucleated Cell Count, Fluid: 138 cu mm (ref 0–1000)

## 2018-02-18 LAB — COMPREHENSIVE METABOLIC PANEL
ALT: 22 U/L (ref 0–44)
AST: 85 U/L — ABNORMAL HIGH (ref 15–41)
Albumin: 1.6 g/dL — ABNORMAL LOW (ref 3.5–5.0)
Alkaline Phosphatase: 162 U/L — ABNORMAL HIGH (ref 38–126)
Anion gap: 6 (ref 5–15)
BUN: 10 mg/dL (ref 6–20)
CO2: 25 mmol/L (ref 22–32)
Calcium: 7.5 mg/dL — ABNORMAL LOW (ref 8.9–10.3)
Chloride: 102 mmol/L (ref 98–111)
Creatinine, Ser: 0.59 mg/dL (ref 0.44–1.00)
GFR calc Af Amer: >60 mL/min (ref >60)
Glucose, Bld: 89 mg/dL (ref 70–99)
POTASSIUM: 4 mmol/L (ref 3.5–5.1)
Sodium: 133 mmol/L — ABNORMAL LOW (ref 135–145)
Total Bilirubin: 3.2 mg/dL — ABNORMAL HIGH (ref 0.3–1.2)
Total Protein: 6.7 g/dL (ref 6.5–8.1)

## 2018-02-18 LAB — CBC
HCT: 30.7 % — ABNORMAL LOW (ref 36.0–46.0)
Hemoglobin: 10.3 g/dL — ABNORMAL LOW (ref 12.0–15.0)
MCH: 34.3 pg — ABNORMAL HIGH (ref 26.0–34.0)
MCHC: 33.6 g/dL (ref 30.0–36.0)
MCV: 102.3 fL — ABNORMAL HIGH (ref 80.0–100.0)
Platelets: 197 10*3/uL (ref 150–400)
RBC: 3 MIL/uL — ABNORMAL LOW (ref 3.87–5.11)
RDW: 15.5 % (ref 11.5–15.5)
WBC: 6 10*3/uL (ref 4.0–10.5)
nRBC: 0 % (ref 0.0–0.2)

## 2018-02-18 LAB — PROTIME-INR
INR: 1.78
Prothrombin Time: 20.5 seconds — ABNORMAL HIGH (ref 11.4–15.2)

## 2018-02-18 LAB — POTASSIUM: Potassium: 4.2 mmol/L (ref 3.5–5.1)

## 2018-02-18 MED ORDER — LIDOCAINE HCL 1 % IJ SOLN
INTRAMUSCULAR | Status: AC
  Filled 2018-02-18: qty 10

## 2018-02-18 MED ORDER — SODIUM CHLORIDE 0.9% FLUSH
3.0000 mL | Freq: Once | INTRAVENOUS | Status: AC
  Administered 2018-02-18: 3 mL via INTRAVENOUS

## 2018-02-18 MED ORDER — LIDOCAINE-EPINEPHRINE (PF) 2 %-1:200000 IJ SOLN
10.0000 mL | Freq: Once | INTRAMUSCULAR | Status: AC
  Administered 2018-02-18: 10 mL
  Filled 2018-02-18: qty 20

## 2018-02-18 MED ORDER — HYDROMORPHONE HCL 1 MG/ML IJ SOLN
1.0000 mg | Freq: Once | INTRAMUSCULAR | Status: AC
  Administered 2018-02-18: 1 mg via INTRAVENOUS
  Filled 2018-02-18: qty 1

## 2018-02-18 NOTE — ED Triage Notes (Signed)
Patient states she needs a paracentesis. She state her abdominal area hurts all over.

## 2018-02-18 NOTE — ED Notes (Signed)
Pt. Unable to urinate at this time. Will collect urine when pt. Voids. Nurse aware.  

## 2018-02-18 NOTE — ED Notes (Addendum)
This writer walked into pt room and noticed that all belongings were gone, pt had unhooked herself the monitor and appears that pt has left the department. Charge nurse aware. No visualization of IV in the room. Pt not visualized in the department.

## 2018-02-18 NOTE — ED Notes (Signed)
Patient transported to Ultrasound 
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
## 2018-02-18 NOTE — Procedures (Signed)
PROCEDURE SUMMARY:  Successful image-guided paracentesis from the right lower abdomen.  Yielded 5 liters of hazy gold fluid.  No immediate complications.  EBL = 0 mL. Patient tolerated well.   Specimen was not sent for labs.  Gordy Councilmanlexandra Louk PA-C 02/18/2018 5:01 PM

## 2018-02-18 NOTE — ED Notes (Signed)
Spoke with ultrasound tech regarding pt radiology scan. Plan of care review with pt and provider.

## 2018-02-19 NOTE — ED Provider Notes (Signed)
Patient accepted at signout from Dr. Criss Alvine.  Plan was for reassessment and observation post paracentesis with anticipated D/C if stable post procedure.  After completion of ultrasound IR paracentesis, no nursing update after patient returned from paracentesis.  Patient self disconnected from monitoring and eloped.   Arby Barrette, MD 02/19/18 1341

## 2018-02-20 ENCOUNTER — Inpatient Hospital Stay (HOSPITAL_COMMUNITY): Payer: Medicaid Other

## 2018-02-20 ENCOUNTER — Encounter (HOSPITAL_COMMUNITY): Payer: Self-pay | Admitting: Emergency Medicine

## 2018-02-20 ENCOUNTER — Emergency Department (HOSPITAL_COMMUNITY): Payer: Medicaid Other

## 2018-02-20 ENCOUNTER — Inpatient Hospital Stay (HOSPITAL_COMMUNITY)
Admission: EM | Admit: 2018-02-20 | Discharge: 2018-03-07 | DRG: 871 | Disposition: E | Payer: Medicaid Other | Attending: Pulmonary Disease | Admitting: Pulmonary Disease

## 2018-02-20 ENCOUNTER — Other Ambulatory Visit: Payer: Self-pay

## 2018-02-20 DIAGNOSIS — E669 Obesity, unspecified: Secondary | ICD-10-CM | POA: Diagnosis present

## 2018-02-20 DIAGNOSIS — E8809 Other disorders of plasma-protein metabolism, not elsewhere classified: Secondary | ICD-10-CM | POA: Diagnosis present

## 2018-02-20 DIAGNOSIS — Z809 Family history of malignant neoplasm, unspecified: Secondary | ICD-10-CM

## 2018-02-20 DIAGNOSIS — R188 Other ascites: Secondary | ICD-10-CM | POA: Diagnosis not present

## 2018-02-20 DIAGNOSIS — R0902 Hypoxemia: Secondary | ICD-10-CM

## 2018-02-20 DIAGNOSIS — E876 Hypokalemia: Secondary | ICD-10-CM | POA: Diagnosis present

## 2018-02-20 DIAGNOSIS — Z8261 Family history of arthritis: Secondary | ICD-10-CM

## 2018-02-20 DIAGNOSIS — K7460 Unspecified cirrhosis of liver: Secondary | ICD-10-CM | POA: Diagnosis not present

## 2018-02-20 DIAGNOSIS — M1990 Unspecified osteoarthritis, unspecified site: Secondary | ICD-10-CM | POA: Diagnosis present

## 2018-02-20 DIAGNOSIS — Z6837 Body mass index (BMI) 37.0-37.9, adult: Secondary | ICD-10-CM

## 2018-02-20 DIAGNOSIS — D684 Acquired coagulation factor deficiency: Secondary | ICD-10-CM | POA: Diagnosis present

## 2018-02-20 DIAGNOSIS — Z789 Other specified health status: Secondary | ICD-10-CM

## 2018-02-20 DIAGNOSIS — A419 Sepsis, unspecified organism: Principal | ICD-10-CM | POA: Diagnosis present

## 2018-02-20 DIAGNOSIS — Z833 Family history of diabetes mellitus: Secondary | ICD-10-CM

## 2018-02-20 DIAGNOSIS — N179 Acute kidney failure, unspecified: Secondary | ICD-10-CM | POA: Diagnosis present

## 2018-02-20 DIAGNOSIS — Z886 Allergy status to analgesic agent status: Secondary | ICD-10-CM

## 2018-02-20 DIAGNOSIS — I469 Cardiac arrest, cause unspecified: Secondary | ICD-10-CM | POA: Diagnosis not present

## 2018-02-20 DIAGNOSIS — Z978 Presence of other specified devices: Secondary | ICD-10-CM

## 2018-02-20 DIAGNOSIS — I10 Essential (primary) hypertension: Secondary | ICD-10-CM | POA: Diagnosis present

## 2018-02-20 DIAGNOSIS — R402362 Coma scale, best motor response, obeys commands, at arrival to emergency department: Secondary | ICD-10-CM | POA: Diagnosis present

## 2018-02-20 DIAGNOSIS — E162 Hypoglycemia, unspecified: Secondary | ICD-10-CM | POA: Diagnosis present

## 2018-02-20 DIAGNOSIS — E872 Acidosis: Secondary | ICD-10-CM | POA: Diagnosis present

## 2018-02-20 DIAGNOSIS — K652 Spontaneous bacterial peritonitis: Secondary | ICD-10-CM | POA: Diagnosis present

## 2018-02-20 DIAGNOSIS — Z515 Encounter for palliative care: Secondary | ICD-10-CM | POA: Diagnosis not present

## 2018-02-20 DIAGNOSIS — R402142 Coma scale, eyes open, spontaneous, at arrival to emergency department: Secondary | ICD-10-CM | POA: Diagnosis present

## 2018-02-20 DIAGNOSIS — K7031 Alcoholic cirrhosis of liver with ascites: Secondary | ICD-10-CM | POA: Diagnosis present

## 2018-02-20 DIAGNOSIS — K429 Umbilical hernia without obstruction or gangrene: Secondary | ICD-10-CM | POA: Diagnosis present

## 2018-02-20 DIAGNOSIS — R402242 Coma scale, best verbal response, confused conversation, at arrival to emergency department: Secondary | ICD-10-CM | POA: Diagnosis present

## 2018-02-20 DIAGNOSIS — R6521 Severe sepsis with septic shock: Secondary | ICD-10-CM | POA: Diagnosis present

## 2018-02-20 DIAGNOSIS — K439 Ventral hernia without obstruction or gangrene: Secondary | ICD-10-CM | POA: Diagnosis present

## 2018-02-20 DIAGNOSIS — Z66 Do not resuscitate: Secondary | ICD-10-CM | POA: Diagnosis not present

## 2018-02-20 DIAGNOSIS — J9601 Acute respiratory failure with hypoxia: Secondary | ICD-10-CM | POA: Diagnosis not present

## 2018-02-20 DIAGNOSIS — Z87891 Personal history of nicotine dependence: Secondary | ICD-10-CM

## 2018-02-20 DIAGNOSIS — K7290 Hepatic failure, unspecified without coma: Secondary | ICD-10-CM | POA: Diagnosis present

## 2018-02-20 DIAGNOSIS — B182 Chronic viral hepatitis C: Secondary | ICD-10-CM | POA: Diagnosis present

## 2018-02-20 LAB — CBC WITH DIFFERENTIAL/PLATELET
Abs Immature Granulocytes: 0.24 10*3/uL — ABNORMAL HIGH (ref 0.00–0.07)
BASOS PCT: 0 %
Basophils Absolute: 0 10*3/uL (ref 0.0–0.1)
Eosinophils Absolute: 1 10*3/uL — ABNORMAL HIGH (ref 0.0–0.5)
Eosinophils Relative: 17 %
HCT: 41.5 % (ref 36.0–46.0)
Hemoglobin: 12.9 g/dL (ref 12.0–15.0)
Immature Granulocytes: 4 %
Lymphocytes Relative: 5 %
Lymphs Abs: 0.3 10*3/uL — ABNORMAL LOW (ref 0.7–4.0)
MCH: 34.2 pg — ABNORMAL HIGH (ref 26.0–34.0)
MCHC: 31.1 g/dL (ref 30.0–36.0)
MCV: 110.1 fL — ABNORMAL HIGH (ref 80.0–100.0)
Monocytes Absolute: 0.2 10*3/uL (ref 0.1–1.0)
Monocytes Relative: 3 %
NEUTROS ABS: 4.2 10*3/uL (ref 1.7–7.7)
Neutrophils Relative %: 71 %
Platelets: 196 10*3/uL (ref 150–400)
RBC: 3.77 MIL/uL — ABNORMAL LOW (ref 3.87–5.11)
RDW: 16.2 % — ABNORMAL HIGH (ref 11.5–15.5)
WBC: 5.9 10*3/uL (ref 4.0–10.5)
nRBC: 0 % (ref 0.0–0.2)

## 2018-02-20 LAB — COMPREHENSIVE METABOLIC PANEL
ALT: 27 U/L (ref 0–44)
AST: 79 U/L — ABNORMAL HIGH (ref 15–41)
Albumin: 1.7 g/dL — ABNORMAL LOW (ref 3.5–5.0)
Alkaline Phosphatase: 101 U/L (ref 38–126)
Anion gap: 25 — ABNORMAL HIGH (ref 5–15)
BUN: 21 mg/dL — ABNORMAL HIGH (ref 6–20)
CO2: 12 mmol/L — ABNORMAL LOW (ref 22–32)
Calcium: 8 mg/dL — ABNORMAL LOW (ref 8.9–10.3)
Chloride: 99 mmol/L (ref 98–111)
Creatinine, Ser: 2.94 mg/dL — ABNORMAL HIGH (ref 0.44–1.00)
GFR calc Af Amer: 20 mL/min — ABNORMAL LOW (ref >60)
GFR calc non Af Amer: 17 mL/min — ABNORMAL LOW (ref >60)
Glucose, Bld: 48 mg/dL — ABNORMAL LOW (ref 70–99)
POTASSIUM: 2.9 mmol/L — AB (ref 3.5–5.1)
Sodium: 136 mmol/L (ref 135–145)
TOTAL PROTEIN: 8.1 g/dL (ref 6.5–8.1)
Total Bilirubin: 7.6 mg/dL — ABNORMAL HIGH (ref 0.3–1.2)

## 2018-02-20 LAB — GLUCOSE, PLEURAL OR PERITONEAL FLUID

## 2018-02-20 LAB — ALBUMIN, PLEURAL OR PERITONEAL FLUID: Albumin, Fluid: <1 g/dL

## 2018-02-20 LAB — MRSA PCR SCREENING: MRSA by PCR: NEGATIVE

## 2018-02-20 LAB — AMMONIA: Ammonia: 25 umol/L (ref 9–35)

## 2018-02-20 LAB — PROTIME-INR
INR: 2.14
Prothrombin Time: 23.6 seconds — ABNORMAL HIGH (ref 11.4–15.2)

## 2018-02-20 LAB — LACTIC ACID, PLASMA
Lactic Acid, Venous: >11 mmol/L (ref 0.5–1.9)
Lactic Acid, Venous: >11 mmol/L (ref 0.5–1.9)

## 2018-02-20 LAB — LACTATE DEHYDROGENASE, PLEURAL OR PERITONEAL FLUID: LD, Fluid: 124 U/L — ABNORMAL HIGH (ref 3–23)

## 2018-02-20 LAB — GLUCOSE, CAPILLARY: Glucose-Capillary: 92 mg/dL (ref 70–99)

## 2018-02-20 LAB — PROTEIN, PLEURAL OR PERITONEAL FLUID: Total protein, fluid: <3 g/dL

## 2018-02-20 MED ORDER — IOPAMIDOL (ISOVUE-300) INJECTION 61%
INTRAVENOUS | Status: AC
  Filled 2018-02-20: qty 100

## 2018-02-20 MED ORDER — NOREPINEPHRINE 4 MG/250ML-% IV SOLN
0.0000 ug/min | INTRAVENOUS | Status: DC
  Administered 2018-02-20 (×2): 40 ug/min via INTRAVENOUS
  Filled 2018-02-20: qty 500
  Filled 2018-02-20: qty 250

## 2018-02-20 MED ORDER — FOLIC ACID 1 MG PO TABS
1.0000 mg | ORAL_TABLET | Freq: Every day | ORAL | Status: DC
  Filled 2018-02-20: qty 1

## 2018-02-20 MED ORDER — NOREPINEPHRINE 4 MG/250ML-% IV SOLN
2.0000 ug/min | INTRAVENOUS | Status: DC
  Administered 2018-02-20: 15 ug/min via INTRAVENOUS
  Filled 2018-02-20: qty 250

## 2018-02-20 MED ORDER — VITAMIN B-1 50 MG PO TABS
50.0000 mg | ORAL_TABLET | Freq: Every day | ORAL | Status: DC
  Filled 2018-02-20: qty 1

## 2018-02-20 MED ORDER — SODIUM CHLORIDE 0.9 % IV BOLUS
1000.0000 mL | Freq: Once | INTRAVENOUS | Status: AC
  Administered 2018-02-20: 1000 mL via INTRAVENOUS

## 2018-02-20 MED ORDER — PROMETHAZINE HCL 25 MG PO TABS: 25.0000 mg | ORAL_TABLET | Freq: Four times a day (QID) | ORAL | Status: DC | PRN

## 2018-02-20 MED ORDER — FENTANYL CITRATE (PF) 100 MCG/2ML IJ SOLN
25.0000 ug | INTRAMUSCULAR | Status: DC | PRN
  Administered 2018-02-20: 50 ug via INTRAVENOUS
  Filled 2018-02-20: qty 2

## 2018-02-20 MED ORDER — SODIUM CHLORIDE 0.9 % IV SOLN
2.0000 g | INTRAVENOUS | Status: DC
  Administered 2018-02-20 – 2018-02-21 (×2): 2 g via INTRAVENOUS
  Filled 2018-02-20: qty 2
  Filled 2018-02-20 (×2): qty 20

## 2018-02-20 MED ORDER — SODIUM CHLORIDE 0.9 % IV BOLUS (SEPSIS)
1000.0000 mL | Freq: Once | INTRAVENOUS | Status: AC
  Administered 2018-02-20: 1000 mL via INTRAVENOUS

## 2018-02-20 MED ORDER — NOREPINEPHRINE 4 MG/250ML-% IV SOLN
0.0000 ug/min | INTRAVENOUS | Status: DC
  Administered 2018-02-20: 4 ug/min via INTRAVENOUS
  Filled 2018-02-20: qty 250

## 2018-02-20 MED ORDER — DEXTROSE 50 % IV SOLN
50.0000 mL | Freq: Once | INTRAVENOUS | Status: AC
  Administered 2018-02-20: 50 mL via INTRAVENOUS

## 2018-02-20 MED ORDER — POTASSIUM CHLORIDE CRYS ER 10 MEQ PO TBCR
10.0000 meq | EXTENDED_RELEASE_TABLET | Freq: Every day | ORAL | Status: DC
  Filled 2018-02-20: qty 1

## 2018-02-20 MED ORDER — EPINEPHRINE PF 1 MG/ML IJ SOLN
1.0000 mg | Freq: Once | INTRAMUSCULAR | Status: AC
  Administered 2018-02-20: 1 mg via INTRAVENOUS

## 2018-02-20 MED ORDER — POTASSIUM CHLORIDE 10 MEQ/100ML IV SOLN: 10.0000 meq | INTRAVENOUS | Status: AC

## 2018-02-20 MED ORDER — SODIUM CHLORIDE 0.9 % IV SOLN
250.0000 mL | INTRAVENOUS | Status: DC
  Administered 2018-02-21: 250 mL via INTRAVENOUS

## 2018-02-20 MED ORDER — VITAMIN B-1 100 MG PO TABS
100.0000 mg | ORAL_TABLET | Freq: Every day | ORAL | Status: DC
  Filled 2018-02-20: qty 1

## 2018-02-20 MED ORDER — SODIUM CHLORIDE 0.9 % IV SOLN
INTRAVENOUS | Status: AC
  Administered 2018-02-20: 19:00:00 via INTRAVENOUS

## 2018-02-20 MED ORDER — HYDROMORPHONE HCL 1 MG/ML IJ SOLN
1.0000 mg | INTRAMUSCULAR | Status: DC | PRN
  Administered 2018-02-20: 1 mg via INTRAVENOUS
  Filled 2018-02-20: qty 1

## 2018-02-20 MED ORDER — LACTULOSE 10 GM/15ML PO SOLN
30.0000 g | Freq: Two times a day (BID) | ORAL | Status: DC
  Filled 2018-02-20: qty 45

## 2018-02-20 MED ORDER — SODIUM CHLORIDE (PF) 0.9 % IJ SOLN
INTRAMUSCULAR | Status: AC
  Filled 2018-02-20: qty 50

## 2018-02-20 MED ORDER — HEPARIN SODIUM (PORCINE) 5000 UNIT/ML IJ SOLN
5000.0000 [IU] | Freq: Three times a day (TID) | INTRAMUSCULAR | Status: DC
  Administered 2018-02-20 – 2018-02-22 (×5): 5000 [IU] via SUBCUTANEOUS
  Filled 2018-02-20 (×5): qty 1

## 2018-02-20 MED ORDER — METRONIDAZOLE IN NACL 5-0.79 MG/ML-% IV SOLN
500.0000 mg | Freq: Three times a day (TID) | INTRAVENOUS | Status: DC
  Administered 2018-02-20 – 2018-02-22 (×6): 500 mg via INTRAVENOUS
  Filled 2018-02-20 (×6): qty 100

## 2018-02-20 MED ORDER — HYDROMORPHONE HCL 1 MG/ML PO LIQD: 1.0000 mg | ORAL | Status: DC | PRN

## 2018-02-20 MED ORDER — KCL IN DEXTROSE-NACL 20-5-0.45 MEQ/L-%-% IV SOLN: Freq: Once | INTRAVENOUS | Status: DC

## 2018-02-20 MED ORDER — VASOPRESSIN 20 UNIT/ML IV SOLN
0.0300 [IU]/min | INTRAVENOUS | Status: DC
  Administered 2018-02-20 – 2018-02-21 (×2): 0.03 [IU]/min via INTRAVENOUS
  Filled 2018-02-20 (×2): qty 2

## 2018-02-20 MED ORDER — ADULT MULTIVITAMIN W/MINERALS CH
1.0000 | ORAL_TABLET | Freq: Every day | ORAL | Status: DC
  Filled 2018-02-20: qty 1

## 2018-02-20 MED ORDER — HYDROMORPHONE HCL 1 MG/ML IJ SOLN
1.0000 mg | INTRAMUSCULAR | Status: DC | PRN
  Administered 2018-02-20 – 2018-02-21 (×2): 1 mg via INTRAVENOUS
  Filled 2018-02-20 (×2): qty 1

## 2018-02-20 MED ORDER — ALBUMIN HUMAN 25 % IV SOLN
25.0000 g | Freq: Four times a day (QID) | INTRAVENOUS | Status: AC
  Administered 2018-02-20: 12.5 g via INTRAVENOUS
  Administered 2018-02-21 (×3): 25 g via INTRAVENOUS
  Filled 2018-02-20: qty 100
  Filled 2018-02-20: qty 50
  Filled 2018-02-20: qty 100
  Filled 2018-02-20 (×3): qty 50

## 2018-02-20 MED ORDER — POTASSIUM CHLORIDE 10 MEQ/100ML IV SOLN
10.0000 meq | INTRAVENOUS | Status: AC
  Administered 2018-02-20 (×2): 10 meq via INTRAVENOUS
  Filled 2018-02-20 (×2): qty 100

## 2018-02-20 MED FILL — Medication: Qty: 1 | Status: AC

## 2018-02-20 NOTE — ED Triage Notes (Addendum)
Pt c/o abdominal pain in R & L upper abdomen, pt states she is SOB and recent intermittent confusion after paracentesis 2 days ago. Pt reports she had multiple areas drained on abdomen. Pt moaning in triage and states states at times she doesn't know where she is. Pt hypotensive and tachycardic.

## 2018-02-20 NOTE — ED Notes (Signed)
Bed: WA21 Expected date:  Expected time:  Means of arrival:  Comments: Triage 3 
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
## 2018-02-20 NOTE — Progress Notes (Signed)
CRITICAL VALUE ALERT  Critical Value:  Lactic Acid >11.0  Date & Time Notied:  1442    03/10/18  Provider Notified: Pilar Plate MD  Orders Received/Actions taken: none at this time

## 2018-02-20 NOTE — Progress Notes (Signed)
RT attempted Arterial line insertion X's 1 without success.  Pt refusing to be stuck another time, RN aware.  RT to monitor and assess as needed.

## 2018-02-20 NOTE — Procedures (Signed)
Central Venous Catheter Insertion Procedure Note CHEYNEY VELETA [id number] 1965/12/07  Procedure: Insertion of Central Venous Catheter Indications: Assessment of intravascular volume, Drug and/or fluid administration and Frequent blood sampling  Procedure Details Consent: Risks of procedure as well as the alternatives and risks of each were explained to the (patient/caregiver).  Consent for procedure obtained. Time Out: Verified patient identification, verified procedure, site/side was marked, verified correct patient position, special equipment/implants available, medications/allergies/relevent history reviewed, required imaging and test results available.  Performed  Maximum sterile technique was used including antiseptics, cap, gloves, gown, hand hygiene, mask and sheet. Skin prep: Chlorhexidine; local anesthetic administered A 7.4F 20cm antimicrobial bonded/coated triple lumen catheter was placed in the left internal jugular vein using the Seldinger technique.  Inserted under ultrasound guidance. Good flowback into syringe but unable to pass wire through angiocath. Angiocath pulled back and flow verified. Guidewire inserted. Good flow back through all three ports.  Evaluation Blood flow good Complications: No apparent complications Patient did tolerate procedure well. Chest X-ray ordered to verify placement.  CXR: normal. Tip at SVC/RA junction.  Ramona Slinger 03/02/2018, 7:06 PM

## 2018-02-20 NOTE — Progress Notes (Signed)
Pt arrived from the ED to room 1238.   Levophed running at 8 mcg/min.   Pt blood pressure read 69/45 with initial assessment at 17:30.  Levophed increased to 9 mcg / min, BP 58/37.   1L NS bolus given wide open.  Pt had 30 seconds of unresponsiveness to pain, CBG checked revealed 48.   (18:18) 1 amp Dextrose 50 IV   Levophed increased to 10 mcg / min, 82 / 55.  MD notified of BP and ordered second 1 L bolus wide open.   Follow up CBG 150.   Verbal order for Levophed peripherally to increase to 15 mcg / min.  BP 85/58, MD at bedside inserting CVL.   19:10 Elink notified that no orders for central line pressors were ordered. Received order for new range of Levophed 0-40 mcg / min.   Levophed titrated up to max of 40 mcg / min, BP remained 76/ 59. Elink notified, ordered vasopressin.   BP verified on leg and manually.   BP increased 90/50 map (64).  Report handed off to Dema Severin RN

## 2018-02-20 NOTE — ED Notes (Signed)
ED TO INPATIENT HANDOFF REPORT  Name/Age/Gender Linda Vaughn 53 y.o. female  Code Status    Code Status Orders  (From admission, onward)         Start     Ordered   03/01/18 1622  Full code  Continuous     03/01/18 1627        Code Status History    Date Active Date Inactive Code Status Order ID Comments User Context   01/04/2018 0356 01/06/2018 1622 Full Code [id number]  Eduard Clos, MD ED   11/25/2017 1831 11/27/2017 1857 Full Code [id number]  Zigmund Daniel., MD Inpatient   11/18/2017 2338 11/19/2017 2132 Full Code [id number]  John Giovanni, MD Inpatient   05/24/2017 0229 05/28/2017 1537 Full Code [id number]  Therisa Doyne, MD Inpatient   04/19/2017 2351 04/22/2017 1748 Full Code [id number]  Ellwood Dense, DO ED   03/25/2017 1055 03/27/2017 1657 Full Code [id number]  Russella Dar, NP ED   01/26/2017 1658 01/31/2017 1551 Full Code [id number]  Shon Hale, MD ED   11/24/2016 1035 11/28/2016 1847 Full Code [id number]  Pearson Grippe, MD ED   10/08/2015 1012 10/09/2015 1432 Full Code [id number]  Cleora Fleet, MD Inpatient   05/24/2015 2102 05/30/2015 1712 Full Code [id number]  Albertine Grates, MD Inpatient      Home/SNF/Other Home  Chief Complaint body pain  Level of Care/Admitting Diagnosis ED Disposition    ED Disposition Condition Comment   Admit  Hospital Area: [facility] [[id number]]  Level of Care: ICU [6]  Diagnosis: Spontaneous bacterial peritonitis (HCC) [301.60.ICD-9-CM]  Admitting Physician: Lynnell Catalan [[id number]]  Attending Physician: Lynnell Catalan [[id number]]  Estimated length of stay: 3 - 4 days  Certification:: I certify this patient will need inpatient services for at least 2 midnights  PT Class (Do Not Modify): Inpatient [101]  PT Acc Code (Do Not Modify): Private [1]       Medical History Past Medical History:  Diagnosis Date  . Arthritis   . Ascites   . Cirrhosis of liver (HCC)   . Gallstones   .  Hepatitis C   . Hypertension   . Macrocytic anemia   . Recurrent right pleural effusion   . SBP (spontaneous bacterial peritonitis) (HCC) 11/23/2016  . Thrombocytopenia (HCC)   . Umbilical hernia     Allergies Allergies  Allergen Reactions  . Aspirin Other (See Comments)    Liver damage   . Ibuprofen Other (See Comments)    Liver damage  . Tylenol [Acetaminophen] Other (See Comments)    Liver damage    IV Location/Drains/Wounds Patient Lines/Drains/Airways Status   Active Line/Drains/Airways    Name:   Placement date:   Placement time:   Site:   Days:   Peripheral IV 03-01-2018 Right Wrist   2018-03-01    1327    Wrist   less than 1   Peripheral IV March 01, 2018 Left;Upper Arm   03-01-2018    1527    Arm   less than 1          Labs/Imaging Results for orders placed or performed during the hospital encounter of Mar 01, 2018 (from the past 48 hour(s))  Lactic acid, plasma     Status: Abnormal   Collection Time: 2018-03-01  1:03 PM  Result Value Ref Range   Lactic Acid, Venous >11.0 (HH) 0.5 - 1.9 mmol/L    Comment: REPEATED TO VERIFY CRITICAL RESULT CALLED TO, READ BACK BY AND VERIFIED WITH: LAMB,S. RN AT  1440 02/23/18 MULLINS,T Performed at [facility], [address], [place], [place] [address]   Comprehensive metabolic panel     Status: Abnormal   Collection Time: 23-Feb-2018  1:03 PM  Result Value Ref Range   Sodium 136 135 - 145 mmol/L    Comment: REPEATED TO VERIFY   Potassium 2.9 (L) 3.5 - 5.1 mmol/L   Chloride 99 98 - 111 mmol/L    Comment: REPEATED TO VERIFY   CO2 12 (L) 22 - 32 mmol/L    Comment: REPEATED TO VERIFY   Glucose, Bld 48 (L) 70 - 99 mg/dL   BUN 21 (H) 6 - 20 mg/dL   Creatinine, Ser 6.04 (H) 0.44 - 1.00 mg/dL   Calcium 8.0 (L) 8.9 - 10.3 mg/dL   Total Protein 8.1 6.5 - 8.1 g/dL   Albumin 1.7 (L) 3.5 - 5.0 g/dL   AST 79 (H) 15 - 41 U/L   ALT 27 0 - 44 U/L    Comment: RESULTS CONFIRMED BY MANUAL DILUTION   Alkaline Phosphatase 101 38 - 126  U/L   Total Bilirubin 7.6 (H) 0.3 - 1.2 mg/dL   GFR calc non Af Amer 17 (L) >60 mL/min   GFR calc Af Amer 20 (L) >60 mL/min   Anion gap 25 (H) 5 - 15    Comment: REPEATED TO VERIFY Performed at [facility], [address], [place], [place] [address]   CBC WITH DIFFERENTIAL     Status: Abnormal   Collection Time: 23-Feb-2018  1:03 PM  Result Value Ref Range   WBC 5.9 4.0 - 10.5 K/uL   RBC 3.77 (L) 3.87 - 5.11 MIL/uL   Hemoglobin 12.9 12.0 - 15.0 g/dL   HCT 11.9 14.7 - 82.9 %   MCV 110.1 (H) 80.0 - 100.0 fL   MCH 34.2 (H) 26.0 - 34.0 pg   MCHC 31.1 30.0 - 36.0 g/dL   RDW 56.2 (H) 13.0 - 86.5 %   Platelets 196 150 - 400 K/uL   nRBC 0.0 0.0 - 0.2 %   Neutrophils Relative % 71 %   Neutro Abs 4.2 1.7 - 7.7 K/uL   Lymphocytes Relative 5 %   Lymphs Abs 0.3 (L) 0.7 - 4.0 K/uL   Monocytes Relative 3 %   Monocytes Absolute 0.2 0.1 - 1.0 K/uL   Eosinophils Relative 17 %   Eosinophils Absolute 1.0 (H) 0.0 - 0.5 K/uL   Basophils Relative 0 %   Basophils Absolute 0.0 0.0 - 0.1 K/uL   Immature Granulocytes 4 %   Abs Immature Granulocytes 0.24 (H) 0.00 - 0.07 K/uL   Burr Cells PRESENT    Polychromasia PRESENT     Comment: Performed at [facility], [address] [place], [place] [address]  Protime-INR     Status: Abnormal   Collection Time: 02/23/2018  1:03 PM  Result Value Ref Range   Prothrombin Time 23.6 (H) 11.4 - 15.2 seconds   INR 2.14     Comment: Performed at [facility], [address], [place], [place] [address]  Ammonia     Status: None   Collection Time: 2018-02-23  2:31 PM  Result Value Ref Range   Ammonia 25 9 - 35 umol/L    Comment: Performed at [facility], [address] [place], [place] [address]  Lactic acid, plasma     Status: Abnormal   Collection Time: 02-23-2018  3:03 PM  Result Value Ref Range  Lactic Acid, Venous >11.0 (HH) 0.5 - 1.9 mmol/L    Comment: CRITICAL RESULT CALLED TO, READ  BACK BY AND VERIFIED WITHKatha Cabal RN AT 1608 02/19/2018 MULLINS,T Performed at [facility], [address] [place], [place] [address]   Lactate dehydrogenase (pleural or peritoneal fluid)     Status: Abnormal   Collection Time: 03/01/2018  3:29 PM  Result Value Ref Range   LD, Fluid 124 (H) 3 - 23 U/L    Comment: (NOTE) Results should be evaluated in conjunction with serum values    Fluid Type-FLDH PERITONEAL CAVITY     Comment: Performed at [facility], [address], [place], [place] [address]  Glucose, pleural or peritoneal fluid     Status: None   Collection Time: 02/26/2018  3:29 PM  Result Value Ref Range   Glucose, Fluid <20 mg/dL   Fluid Type-FGLU PERITONEAL CAVITY     Comment: Performed at [facility], [address], [place], [place] [address]  Protein, pleural or peritoneal fluid     Status: None   Collection Time: 03/02/2018  3:29 PM  Result Value Ref Range   Total protein, fluid <3.0 g/dL    Comment: (NOTE) No normal range established for this test Results should be evaluated in conjunction with serum values    Fluid Type-FTP PERITONEAL CAVITY     Comment: Performed at [facility], [address], [place], [place] [address]  Albumin, pleural or peritoneal fluid     Status: None   Collection Time: 02/19/2018  3:29 PM  Result Value Ref Range   Albumin, Fluid <1.0 g/dL    Comment: (NOTE) No normal range established for this test Results should be evaluated in conjunction with serum values    Fluid Type-FALB PERITONEAL CAVITY     Comment: Performed at [facility], [address], [place], [place] [address]   Ct Abdomen Pelvis Wo Contrast  Result Date: 02/11/2018 CLINICAL DATA:  Abdominal pain.  Recent paracentesis. EXAM: CT ABDOMEN AND PELVIS WITHOUT CONTRAST TECHNIQUE: Multidetector CT imaging of the abdomen and pelvis was performed following the standard protocol  without IV contrast. COMPARISON:  01/04/2018 FINDINGS: Lower chest: Atelectasis and or consolidation in the right lower lobe. Small right effusion. Left base is clear except for mild scarring. Hepatobiliary: Chronic cirrhosis of the liver with small nodular surface liver. Previous cholecystectomy. Pancreas: No pancreatic abnormality seen. Spleen: Normal size without focal lesion. Adrenals/Urinary Tract: No adrenal lesion is seen. Kidneys appear normal. Bladder is normal. Stomach/Bowel: No sign of bowel obstruction. Vascular/Lymphatic: Aortic atherosclerosis. No sign of aneurysm. No retroperitoneal adenopathy. Reproductive: Uterus and adnexal regions appear unremarkable, surrounded by ascites. Other: Massive amount of ascites, similar to multiple previous exams, diffusely distributed. Multilocular fluid density lesion in the anterior midline presumed to represent fluid extending through a hernia defect. Musculoskeletal: No acute bone finding. IMPRESSION: Massive ascites, similar to the previous study. Diffusely distributed. Chronic ascites Right lung base atelectasis and or pneumonia.  Small right effusion. Multi septated fluid lesion of the anterior abdominal midline seen chronically and presumed to represent extension of fluid throughout hernia defect. Electronically Signed   By: Paulina Fusi M.D.   On: 02/11/2018 15:09   Ct Head Wo Contrast  Result Date: 02/13/2018 CLINICAL DATA:  Abdominal pain.  Confusion. EXAM: CT HEAD WITHOUT CONTRAST TECHNIQUE: Contiguous axial images were obtained from the base of the skull through the vertex without intravenous contrast. COMPARISON:  05/23/2017 FINDINGS: Brain:  The brain shows a normal appearance without evidence of malformation, atrophy, old or acute small or large vessel infarction, mass lesion, hemorrhage, hydrocephalus or extra-axial collection. Vascular: No hyperdense vessel. No evidence of atherosclerotic calcification. Skull: Normal.  No traumatic finding.  No  focal bone lesion. Sinuses/Orbits: Sinuses are clear. Orbits appear normal. Mastoids are clear. Other: None significant IMPRESSION: Normal head CT Electronically Signed   By: Paulina Fusi M.D.   On: Mar 04, 2018 14:55   Dg Abdomen Acute W/chest  Result Date: Mar 04, 2018 CLINICAL DATA:  Abdominal pain right upper quadrant and left upper quadrant. Shortness of breath and confusion. EXAM: DG ABDOMEN ACUTE W/ 1V CHEST COMPARISON:  Recent ultrasound. FINDINGS: Diffuse abdominal opacity consistent with ascites. No sign of bowel obstruction or free air. No abnormal calcification or bone findings. One-view chest shows normal heart size and mediastinal shadows. There is basilar atelectasis. There is a small amount of pleural fluid on right. IMPRESSION: Increased abdominal opacity consistent with ascites. No acute bowel finding. No free air. Basilar atelectasis. Right effusion. Electronically Signed   By: Paulina Fusi M.D.   On: 03-04-2018 14:53   None  Pending Labs Unresulted Labs (From admission, onward)    Start     Ordered   02/21/18 0500  CBC  Tomorrow morning,   R     03/04/18 1627   02/21/18 0500  Basic metabolic panel  Tomorrow morning,   R     2018/03/04 1627   02/21/18 0500  Magnesium  Tomorrow morning,   R     03/04/18 1627   02/21/18 0500  Phosphorus  Tomorrow morning,   R     March 04, 2018 1627   Mar 04, 2018 1621  CBC  (heparin)  Once,   R    Comments:  Baseline for heparin therapy IF NOT ALREADY DRAWN.  Notify MD if PLT < 100 K.    03/04/2018 1627   03/04/2018 1621  Creatinine, serum  (heparin)  Once,   R    Comments:  Baseline for heparin therapy IF NOT ALREADY DRAWN.    Mar 04, 2018 1627   03-04-18 1529  Body fluid culture  (Peritoneal fluid analysis panel (pnl))  ONCE - STAT,   STAT    Question Answer Comment  Are there also cytology or pathology orders on this specimen? No   Patient immune status Normal      03/04/18 1529   2018-03-04 1303  Blood Culture (routine x 2)  BLOOD CULTURE X 2,   STAT      03-04-18 1317   March 04, 2018 1303  Urinalysis, Routine w reflex microscopic  ONCE - STAT,   STAT     03/04/2018 1317          Vitals/Pain Today's Vitals   March 04, 2018 1615 Mar 04, 2018 1630 Mar 04, 2018 1645 03-04-18 1700  BP: (!) 78/46 (!) 72/49 (!) 82/61 (!) 76/57  Pulse: (!) 108 (!) 108 (!) 109 (!) 107  Resp: (!) 30 (!) 25 (!) 31 (!) 26  Temp:      TempSrc:      SpO2: 99% 97% 96% 98%  PainSc:        Isolation Precautions No active isolations  Medications Medications  cefTRIAXone (ROCEPHIN) 2 g in sodium chloride 0.9 % 100 mL IVPB (2 g Intravenous New Bag/Given 2018-03-04 1351)  metroNIDAZOLE (FLAGYL) IVPB 500 mg (500 mg Intravenous New Bag/Given 03/04/2018 1510)  sodium chloride (PF) 0.9 % injection (has no administration in time range)  potassium chloride 10 mEq in 100 mL IVPB (has  no administration in time range)  sodium chloride 0.9 % bolus 1,000 mL (1,000 mLs Intravenous New Bag/Given 2018-03-22 1629)  heparin injection 5,000 Units (has no administration in time range)  0.9 %  sodium chloride infusion (has no administration in time range)  norepinephrine (LEVOPHED) 4mg  in premix infusion (has no administration in time range)  0.9 %  sodium chloride infusion (has no administration in time range)  albumin human 25 % solution 25 g (has no administration in time range)  folic acid (FOLVITE) tablet 1 mg (has no administration in time range)  lactulose (CHRONULAC) 10 GM/15ML solution 30 g (has no administration in time range)  multivitamin with minerals tablet 1 tablet (has no administration in time range)  potassium chloride (K-DUR,KLOR-CON) CR tablet 10 mEq (has no administration in time range)  promethazine (PHENERGAN) tablet 25 mg (has no administration in time range)  Vitamin B1 TABS 50 mg (has no administration in time range)  HYDROmorphone (DILAUDID) injection 1 mg (has no administration in time range)  sodium chloride 0.9 % bolus 1,000 mL (0 mLs Intravenous Stopped 2018/03/22 1630)     And  sodium chloride 0.9 % bolus 1,000 mL (0 mLs Intravenous Stopped 22-Mar-2018 1630)  dextrose 50 % solution 50 mL (50 mLs Intravenous Given March 22, 2018 1520)  EPINEPHrine (ADRENALIN) 1 mg (1 mg Intravenous Given 03-22-18 1522)    Mobility walks
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
## 2018-02-20 NOTE — ED Notes (Signed)
Patient transported to CT 

## 2018-02-21 ENCOUNTER — Inpatient Hospital Stay (HOSPITAL_COMMUNITY): Payer: Medicaid Other

## 2018-02-21 DIAGNOSIS — Z515 Encounter for palliative care: Secondary | ICD-10-CM

## 2018-02-21 DIAGNOSIS — R188 Other ascites: Secondary | ICD-10-CM

## 2018-02-21 DIAGNOSIS — K7460 Unspecified cirrhosis of liver: Secondary | ICD-10-CM

## 2018-02-21 LAB — BASIC METABOLIC PANEL
Anion gap: 22 — ABNORMAL HIGH (ref 5–15)
Anion gap: 22 — ABNORMAL HIGH (ref 5–15)
BUN: 24 mg/dL — ABNORMAL HIGH (ref 6–20)
BUN: 25 mg/dL — ABNORMAL HIGH (ref 6–20)
CHLORIDE: 107 mmol/L (ref 98–111)
CO2: 10 mmol/L — ABNORMAL LOW (ref 22–32)
CO2: 9 mmol/L — ABNORMAL LOW (ref 22–32)
Calcium: 7 mg/dL — ABNORMAL LOW (ref 8.9–10.3)
Calcium: 6.9 mg/dL — ABNORMAL LOW (ref 8.9–10.3)
Chloride: 104 mmol/L (ref 98–111)
Creatinine, Ser: 2.93 mg/dL — ABNORMAL HIGH (ref 0.44–1.00)
Creatinine, Ser: 3.53 mg/dL — ABNORMAL HIGH (ref 0.44–1.00)
GFR calc Af Amer: 20 mL/min — ABNORMAL LOW (ref >60)
GFR calc Af Amer: 16 mL/min — ABNORMAL LOW (ref >60)
GFR calc non Af Amer: 17 mL/min — ABNORMAL LOW (ref >60)
GFR calc non Af Amer: 14 mL/min — ABNORMAL LOW (ref >60)
Glucose, Bld: 57 mg/dL — ABNORMAL LOW (ref 70–99)
Glucose, Bld: 226 mg/dL — ABNORMAL HIGH (ref 70–99)
Potassium: 3.6 mmol/L (ref 3.5–5.1)
Potassium: 3.9 mmol/L (ref 3.5–5.1)
SODIUM: 135 mmol/L (ref 135–145)
Sodium: 139 mmol/L (ref 135–145)

## 2018-02-21 LAB — BLOOD CULTURE ID PANEL (REFLEXED)

## 2018-02-21 LAB — GLUCOSE, CAPILLARY
GLUCOSE-CAPILLARY: 44 mg/dL — AB (ref 70–99)
Glucose-Capillary: 53 mg/dL — ABNORMAL LOW (ref 70–99)
Glucose-Capillary: 55 mg/dL — ABNORMAL LOW (ref 70–99)
Glucose-Capillary: 115 mg/dL — ABNORMAL HIGH (ref 70–99)
Glucose-Capillary: 91 mg/dL (ref 70–99)
Glucose-Capillary: 60 mg/dL — ABNORMAL LOW (ref 70–99)
Glucose-Capillary: 89 mg/dL (ref 70–99)
Glucose-Capillary: 85 mg/dL (ref 70–99)
Glucose-Capillary: 82 mg/dL (ref 70–99)

## 2018-02-21 LAB — MAGNESIUM: Magnesium: 1.4 mg/dL — ABNORMAL LOW (ref 1.7–2.4)

## 2018-02-21 LAB — CBC
HCT: 33.6 % — ABNORMAL LOW (ref 36.0–46.0)
Hemoglobin: 10.1 g/dL — ABNORMAL LOW (ref 12.0–15.0)
MCH: 35.3 pg — ABNORMAL HIGH (ref 26.0–34.0)
MCHC: 30.1 g/dL (ref 30.0–36.0)
MCV: 117.5 fL — AB (ref 80.0–100.0)
Platelets: 131 10*3/uL — ABNORMAL LOW (ref 150–400)
RBC: 2.86 MIL/uL — ABNORMAL LOW (ref 3.87–5.11)
RDW: 16.7 % — ABNORMAL HIGH (ref 11.5–15.5)
WBC: 6.4 10*3/uL (ref 4.0–10.5)
nRBC: 0 % (ref 0.0–0.2)

## 2018-02-21 LAB — BLOOD GAS, ARTERIAL
Acid-base deficit: 22 mmol/L — ABNORMAL HIGH (ref 0.0–2.0)
Bicarbonate: 10.2 mmol/L — ABNORMAL LOW (ref 20.0–28.0)
Drawn by: 308601
O2 Content: 15 L/min
O2 Saturation: 76.8 %
Patient temperature: 98.6
pCO2 arterial: 55.2 mmHg — ABNORMAL HIGH (ref 32.0–48.0)
pH, Arterial: 6.897 — CL (ref 7.350–7.450)
pO2, Arterial: 58.3 mmHg — ABNORMAL LOW (ref 83.0–108.0)

## 2018-02-21 LAB — BODY FLUID CULTURE: Culture: NO GROWTH

## 2018-02-21 LAB — GRAM STAIN

## 2018-02-21 LAB — PHOSPHORUS: Phosphorus: 7.5 mg/dL — ABNORMAL HIGH (ref 2.5–4.6)

## 2018-02-21 MED ORDER — DEXTROSE 50 % IV SOLN: 25.0000 mL | Freq: Once | INTRAVENOUS | Status: AC

## 2018-02-21 MED ORDER — LORAZEPAM 2 MG/ML IJ SOLN
INTRAMUSCULAR | Status: AC
  Filled 2018-02-21: qty 1

## 2018-02-21 MED ORDER — DEXTROSE 10 % IV SOLN
INTRAVENOUS | Status: DC
  Administered 2018-02-21: 09:00:00 via INTRAVENOUS
  Filled 2018-02-21: qty 1000

## 2018-02-21 MED ORDER — MAGNESIUM SULFATE IN D5W 1-5 GM/100ML-% IV SOLN
1.0000 g | Freq: Once | INTRAVENOUS | Status: AC
  Administered 2018-02-21: 1 g via INTRAVENOUS
  Filled 2018-02-21: qty 100

## 2018-02-21 MED ORDER — LORAZEPAM 2 MG/ML IJ SOLN: 0.0000 mg | Freq: Two times a day (BID) | INTRAMUSCULAR | Status: DC

## 2018-02-21 MED ORDER — VITAMIN K1 10 MG/ML IJ SOLN
1.0000 mg | Freq: Once | INTRAVENOUS | Status: AC
  Administered 2018-02-21: 1 mg via INTRAVENOUS
  Filled 2018-02-21: qty 0.1

## 2018-02-21 MED ORDER — SODIUM BICARBONATE 8.4 % IV SOLN
INTRAVENOUS | Status: DC
  Administered 2018-02-21 – 2018-02-22 (×3): via INTRAVENOUS
  Filled 2018-02-21 (×4): qty 150

## 2018-02-21 MED ORDER — THIAMINE HCL 100 MG/ML IJ SOLN: 100.0000 mg | Freq: Every day | INTRAMUSCULAR | Status: DC

## 2018-02-21 MED ORDER — VITAMIN B-1 100 MG PO TABS: 100.0000 mg | ORAL_TABLET | Freq: Every day | ORAL | Status: DC

## 2018-02-21 MED ORDER — ADULT MULTIVITAMIN W/MINERALS CH: 1.0000 | ORAL_TABLET | Freq: Every day | ORAL | Status: DC

## 2018-02-21 MED ORDER — DEXTROSE 50 % IV SOLN
INTRAVENOUS | Status: AC
  Filled 2018-02-21: qty 50

## 2018-02-21 MED ORDER — DEXTROSE 50 % IV SOLN
25.0000 mL | Freq: Once | INTRAVENOUS | Status: AC
  Administered 2018-02-21: 25 mL via INTRAVENOUS

## 2018-02-21 MED ORDER — NOREPINEPHRINE 16 MG/250ML-% IV SOLN
0.0000 ug/min | INTRAVENOUS | Status: DC
  Administered 2018-02-21: 40 ug/min via INTRAVENOUS
  Administered 2018-02-21: 38 ug/min via INTRAVENOUS
  Administered 2018-02-21: 40 ug/min via INTRAVENOUS
  Administered 2018-02-22: 33 ug/min via INTRAVENOUS
  Filled 2018-02-21 (×5): qty 250

## 2018-02-21 MED ORDER — SODIUM CHLORIDE 0.9% FLUSH
10.0000 mL | Freq: Two times a day (BID) | INTRAVENOUS | Status: DC
  Administered 2018-02-21: 10 mL

## 2018-02-21 MED ORDER — LORAZEPAM 1 MG PO TABS: 1.0000 mg | ORAL_TABLET | Freq: Four times a day (QID) | ORAL | Status: DC | PRN

## 2018-02-21 MED ORDER — CHLORHEXIDINE GLUCONATE CLOTH 2 % EX PADS
6.0000 | MEDICATED_PAD | Freq: Every day | CUTANEOUS | Status: DC
  Administered 2018-02-22: 6 via TOPICAL

## 2018-02-21 MED ORDER — HYDROMORPHONE HCL 2 MG/ML IJ SOLN
1.5000 mg | INTRAMUSCULAR | Status: DC | PRN
  Administered 2018-02-21 (×3): 1.5 mg via INTRAVENOUS
  Filled 2018-02-21 (×5): qty 1

## 2018-02-21 MED ORDER — SODIUM CHLORIDE 0.9% FLUSH: 10.0000 mL | INTRAVENOUS | Status: DC | PRN

## 2018-02-21 MED ORDER — DEXTROSE 50 % IV SOLN
1.0000 | Freq: Once | INTRAVENOUS | Status: AC
  Administered 2018-02-21: 50 mL via INTRAVENOUS

## 2018-02-21 MED ORDER — DEXTROSE 50 % IV SOLN
INTRAVENOUS | Status: AC
  Administered 2018-02-21: 25 mL
  Filled 2018-02-21: qty 50

## 2018-02-21 MED ORDER — LORAZEPAM 2 MG/ML IJ SOLN: 1.0000 mg | Freq: Four times a day (QID) | INTRAMUSCULAR | Status: DC | PRN

## 2018-02-21 MED ORDER — DEXTROSE 50 % IV SOLN: 25.0000 g | INTRAVENOUS | Status: AC

## 2018-02-21 MED ORDER — FOLIC ACID 1 MG PO TABS: 1.0000 mg | ORAL_TABLET | Freq: Every day | ORAL | Status: DC

## 2018-02-21 MED ORDER — LORAZEPAM 2 MG/ML IJ SOLN
0.0000 mg | Freq: Four times a day (QID) | INTRAMUSCULAR | Status: DC
  Administered 2018-02-21: 2 mg via INTRAVENOUS

## 2018-02-21 MED ORDER — SODIUM CHLORIDE 0.9 % IV SOLN: INTRAVENOUS | Status: DC

## 2018-02-21 NOTE — Progress Notes (Addendum)
eLink Physician-Brief Progress Note Patient Name: Linda Vaughn DOB: February 27, 1965 MRN: [id number]   Date of Service  02/21/2018  HPI/Events of Note  A 53 year old female with a history of alcohol abuse was admitted to the ICU with decompensated liver failure.  Palliative care was consulted by the day team.  Bedside nurse called me regarding hypoxia with oxygen saturation of 88% and delirium.   eICU Interventions  Considering alcohol withdrawal started on Ativan as per CIWA protocol.  Will check ABG and chest x-ray.  Ordered humidified high flow oxygen to maintain oxygen saturation 88% or more.      Intervention Category Major Interventions: Delirium, psychosis, severe agitation - evaluation and management;Respiratory failure - evaluation and management Intermediate Interventions: Communication with other healthcare providers and/or family;Medication change / dose adjustment  Glory Rosebush 02/21/2018, 9:41 PM   10:21 PM Chest x-ray shows pulmonary edema. Patient significantly improved clinically with respiratory rate of 13 and decreased oxygen requirement, back to nasal cannula with oxygen saturation of 94%.  Blood sample for ABG.  Maintain oxygen saturation more than 90%.  Will discuss with the family regarding goals of care if the patient deteriorates overnight.   3:18 AM Patient developed respiratory failure with a pH of 6.9. Discussed with patient's Sister Nicole Cella, she has not made ICU team yet regarding goals of care discussion and decided okay for intubation till then. Called anesthesia and arranged for intubation. Placed the ventilator bundle. Ordered Precedex and as needed fentanyl.  Notified bedside ICU team at [facility].   6:45 AM  Patient is in refractory shock.  On 40 of levophed and standard dose of Vasopressin.   Added Hydrocortisone.  I called the patient family (mainly patient's sister Nicole Cella) and updated patient critical condition and encouraged her to  come to hospital for further goals of care discussion.  Had multiple phone calls with family, charge nurse and [facility] in house doctor through the night.  Signed out to Dr. Denese Killings.
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
## 2018-02-21 NOTE — Progress Notes (Signed)
Elink MD Singasani made aware of pt condition and ABG results. RN gave MD sisters phone number in chart for him to call. MD said to just continue to monitor pt.

## 2018-02-21 NOTE — Progress Notes (Signed)
Hypoglycemic Event  CBG: 44  Treatment: 25 mL D50 given Symptoms: increased confusion and restlessness  Follow-up CBG: Time: 1102 CBG Result:91  Possible Reasons for Event: Patient is NPO   Comments/MD notified:    Jilian West E Nickolai Rinks

## 2018-02-21 NOTE — Progress Notes (Signed)
Patient's O2 sat 85-86 on 5L [place], urine output 100 since 0700 am. MD made aware. Will continue to monitor

## 2018-02-21 NOTE — Progress Notes (Signed)
PHARMACY - PHYSICIAN COMMUNICATION CRITICAL VALUE ALERT - BLOOD CULTURE IDENTIFICATION (BCID)  Linda Vaughn is an 53 y.o. female who presented to [facility] on 02/24/18 with a chief complaint of Abdominal pain, AMS.  Assessment:  Patient with E. Coli bacteremia in the ICU (include suspected source if known)  Name of physician (or Provider) Contacted: Dr. Darrick Penna  Current antibiotics: Ceftriaxone 2gm iv q24hr  Changes to prescribed antibiotics recommended:  Patient is on recommended antibiotics - No changes needed  Results for orders placed or performed during the hospital encounter of 02-24-18  Blood Culture ID Panel (Reflexed) (Collected: 24-Feb-2018  1:03 PM)  Result Value Ref Range   Enterococcus species NOT DETECTED NOT DETECTED   Listeria monocytogenes NOT DETECTED NOT DETECTED   Staphylococcus species NOT DETECTED NOT DETECTED   Staphylococcus aureus (BCID) NOT DETECTED NOT DETECTED   Streptococcus species NOT DETECTED NOT DETECTED   Streptococcus agalactiae NOT DETECTED NOT DETECTED   Streptococcus pneumoniae NOT DETECTED NOT DETECTED   Streptococcus pyogenes NOT DETECTED NOT DETECTED   Acinetobacter baumannii NOT DETECTED NOT DETECTED   Enterobacteriaceae species DETECTED (A) NOT DETECTED   Enterobacter cloacae complex NOT DETECTED NOT DETECTED   Escherichia coli DETECTED (A) NOT DETECTED   Klebsiella oxytoca NOT DETECTED NOT DETECTED   Klebsiella pneumoniae NOT DETECTED NOT DETECTED   Proteus species NOT DETECTED NOT DETECTED   Serratia marcescens NOT DETECTED NOT DETECTED   Carbapenem resistance NOT DETECTED NOT DETECTED   Haemophilus influenzae NOT DETECTED NOT DETECTED   Neisseria meningitidis NOT DETECTED NOT DETECTED   Pseudomonas aeruginosa NOT DETECTED NOT DETECTED   Candida albicans NOT DETECTED NOT DETECTED   Candida glabrata NOT DETECTED NOT DETECTED   Candida krusei NOT DETECTED NOT DETECTED   Candida parapsilosis NOT DETECTED NOT DETECTED   Candida  tropicalis NOT DETECTED NOT DETECTED    Aleene Davidson Crowford 02/21/2018  5:52 AM

## 2018-02-21 NOTE — Progress Notes (Signed)
Hypoglycemic Event  CBG: 60  Treatment: 25 mL D50 given Symptoms: changes in mental status, restlessness  Follow-up CBG: Time:1638 CBG Result:89  Possible Reasons for Event: Patient is NPO  Comments/MD notified:     Antonio Woodhams E Brynne Doane

## 2018-02-21 NOTE — Progress Notes (Signed)
Checked pt.s CBG and received a reading of 53. Gave Dextrose 50. eLink notified Will recheck CBG. Will continue to monitor.  Tommy Medal RN

## 2018-02-21 NOTE — H&P (Signed)
NAME:  Linda DarnerJarryl D Vaughn, MRN:  [id number], DOB:  01-11-1965, LOS: 1 ADMISSION DATE:  Aug 28, 2018, CONSULTATION DATE:  Aug 28, 2018 REFERRING MD:  Calvert CantorBero - [facility], CHIEF COMPLAINT:  Hypotension.   HPI/course in hospital  53 year old woman with known alcoholic and HCV cirrhosis with multiple visits to ED for paracentesis. Last presented 2 days ago with tense ascites and underwent paracentesis and sent home. No labs were sent.  Returns 2/15 with abdominal pain, SOB and intermittent confusion following paracentesis.   Given 3L of fluids and started on NE.   [facility], follow-up appears to be limited.  Interim history/subjective:  Continues to complain of generalized abdominal pain, settles but more lethargic after dilaudid but continues to moan in pain even after Dilaudid.  Objective   Blood pressure (!) 87/53, pulse (!) 115, temperature (!) 97.5 F (36.4 C), temperature source Oral, resp. rate 14, height 5' (1.524 m), weight 86.7 kg, SpO2 93 %.        Intake/Output Summary (Last 24 hours) at 02/21/2018 0750 Last data filed at 02/21/2018 0604 Gross per 24 hour  Intake 5602.55 ml  Output 120 ml  Net 5482.55 ml   Filed Weights   06-07-2018 2308  Weight: 86.7 kg    Examination: remains unchanged Physical Exam  Constitutional: She is oriented to person, place, and time. She appears listless. She appears distressed.  HENT:  Head: Normocephalic and atraumatic.  Right Ear: External ear normal.  Left Ear: External ear normal.  Mouth/Throat: Mucous membranes are dry.  Dry oropharynx  Eyes: Scleral icterus is present.  Neck: Trachea normal. Neck supple.  Cardiovascular: Regular rhythm and normal heart sounds. Tachycardia present.  Brisk capillary refill.  Respiratory: Breath sounds normal. No accessory muscle usage. Tachypnea noted. No respiratory distress.  GI: She exhibits distension and fluid wave. There is generalized abdominal tenderness. A hernia is present. Hernia  confirmed positive in the ventral area.  Reducible periumbilical hernia  Genitourinary:    Genitourinary Comments: Foley catheter in place. Minimal urine output.   Neurological: She is oriented to person, place, and time. She appears listless. GCS eye subscore is 4. GCS verbal subscore is 5. GCS motor subscore is 6.  + asterixis     Ancillary tests (personally reviewed)  CBC: Recent Labs  Lab 02/18/18 0728 06-07-2018 1303 02/21/18 0425  WBC 6.0 5.9 6.4  NEUTROABS  --  4.2  --   HGB 10.3* 12.9 10.1*  HCT 30.7* 41.5 33.6*  MCV 102.3* 110.1* 117.5*  PLT 197 196 131*    Basic Metabolic Panel: Recent Labs  Lab 02/18/18 0728 02/18/18 1101 06-07-2018 1303 02/21/18 0425  NA 133*  --  136 139  K 4.0 4.2 2.9* 3.6  CL 102  --  99 107  CO2 25  --  12* 10*  GLUCOSE 89  --  48* 57*  BUN 10  --  21* 24*  CREATININE 0.59  --  2.94* 2.93*  CALCIUM 7.5*  --  8.0* 7.0*  MG  --   --   --  1.4*  PHOS  --   --   --  7.5*   GFR: Estimated Creatinine Clearance: 21.5 mL/min (A) (by C-G formula based on SCr of 2.93 mg/dL (H)). Recent Labs  Lab 02/18/18 0728 06-07-2018 1303 06-07-2018 1503 06-07-2018 1910 02/21/18 0425  WBC 6.0 5.9  --   --  6.4  LATICACIDVEN  --  >11.0* >11.0* >11.0*  --     Liver Function Tests:  Recent Labs  Lab 02/18/18 0728 03/01/2018 1303  AST 85* 79*  ALT 22 27  ALKPHOS 162* 101  BILITOT 3.2* 7.6*  PROT 6.7 8.1  ALBUMIN 1.6* 1.7*   Recent Labs  Lab 02/18/18 0728  LIPASE 31   Recent Labs  Lab 02/17/2018 1431  AMMONIA 25    ABG    Component Value Date/Time   PHART 7.484 (H) 03/25/2017 0832   PCO2ART 33.1 03/25/2017 0832   PO2ART 123.0 (H) 03/25/2017 0832   HCO3 24.9 03/25/2017 0832   TCO2 28 12/27/2017 0915   ACIDBASEDEF 6.2 (H) 01/27/2017 1158   O2SAT 99.0 03/25/2017 0832     Coagulation Profile: Recent Labs  Lab 02/18/18 0842 02/14/2018 1303  INR 1.78 2.14    Cardiac Enzymes: No results for input(s): CKTOTAL, CKMB, CKMBINDEX, TROPONINI in  the last 168 hours.  HbA1C: Hemoglobin A1C  Date/Time Value Ref Range Status  03/16/2017 11:21 AM 4.8  Final   Hgb A1c MFr Bld  Date/Time Value Ref Range Status  01/25/2015 03:05 PM 4.3 <5.7 % Final    Comment:                                                                           According to the ADA Clinical Practice Recommendations for 2011, when HbA1c is used as a screening test:     >=6.5%   Diagnostic of Diabetes Mellitus            (if abnormal result is confirmed)   5.7-6.4%   Increased risk of developing Diabetes Mellitus   References:Diagnosis and Classification of Diabetes Mellitus,Diabetes Care,2011,34(Suppl 1):S62-S69 and Standards of Medical Care in         Diabetes - 2011,Diabetes Care,2011,34 (Suppl 1):S11-S61.       CBG: Recent Labs  Lab 02/13/2018 1932 02/21/18 0403 02/21/18 0405 02/21/18 0431  GLUCAP 92 55* 53* 115*     Assessment & Plan:  Critically ill due to septic shock requiring fluid resuscitation and initiation of vasopressors to keep MAP>60. Now on vasopressin and norepinephrine with adequate perfusion on current doses. Source likely SBP given abdominal pain.  Mild hepatic encephalopathy - normal ammonia suggest confusion due to other causes. 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 MELD score 33 at present - mortality risk high if no prompt improvement. Clinically appears nearly euvolemic Acute kidney injury likely secondary to volume contraction.  Creatinine has leveled off with current resuscitation. Marked lactic acidosis - exaggerated due to concomitant liver and renal failure. Lactate will take time to clear. Hypoglycemia from hepatic failure   PLAN:  Continue fluid resuscitation - Crystalloid and albumin given recent paracentesis. Titrate NE to keep MAP 60-65 as perfusion appears adequate. Continue non-invasive BP monitoring using LEFT arm as BP higher on this side. Avoid arterial line given patient  agitation and high risk of dislodgement. Follow renal function  Continue lactulose Bicarbonate infusion to correct acidosis, will help vasopressor requirements. Correct coagulopathy with vitamin K  D10W infusion. Maintaining euglycemia might help mental status.   Best practice:  Diet: Sips of clears only Pain/Anxiety/Delirium protocol (if indicated): intermittent hydromorphone for abdominal pain. VAP protocol (if indicated): N/A DVT prophylaxis: UFH GI prophylaxis: not indicated Glucose control: CBG phase  1 Mobility: bed rest Code Status: Full Family Communication: no family present Disposition: admit to ICU- prognosis is poor, will need to contact family.  Palliative care consultation.   Critical care time: 40 min including, chart review, examination of the patient and discussion of patient's condition with ED staff and assessment and modification of fluid and vasopressor therapy.    Lynnell Catalan, MD [facility] ICU Physician [facility] [place] [facility]  Pager: [phone] Mobile: [phone] After hours: [phone].  02/21/2018, 7:50 AM

## 2018-02-21 NOTE — Progress Notes (Signed)
Elink MD Singasani given Pt's blood gas results.  MD aware sample is mixed venous, RT to monitor and assess as needed.

## 2018-02-21 NOTE — Progress Notes (Signed)
Pt currently on 8 LPM [place] and tolerating well at this time.  Pt's sats are currently 94%, Heated high flow [place] not indicated at this time.  RT to monitor and assess as needed.

## 2018-02-22 ENCOUNTER — Inpatient Hospital Stay (HOSPITAL_COMMUNITY): Payer: Medicaid Other

## 2018-02-22 ENCOUNTER — Inpatient Hospital Stay (HOSPITAL_COMMUNITY): Payer: Medicaid Other | Admitting: Certified Registered Nurse Anesthetist

## 2018-02-22 DIAGNOSIS — K652 Spontaneous bacterial peritonitis: Secondary | ICD-10-CM

## 2018-02-22 DIAGNOSIS — R6521 Severe sepsis with septic shock: Secondary | ICD-10-CM

## 2018-02-22 LAB — CBC WITH DIFFERENTIAL/PLATELET
Abs Immature Granulocytes: 0.28 10*3/uL — ABNORMAL HIGH (ref 0.00–0.07)
Basophils Absolute: 0.1 10*3/uL (ref 0.0–0.1)
Basophils Relative: 0 %
Eosinophils Absolute: 0.2 10*3/uL (ref 0.0–0.5)
Eosinophils Relative: 2 %
HCT: 28.2 % — ABNORMAL LOW (ref 36.0–46.0)
HEMOGLOBIN: 8.1 g/dL — AB (ref 12.0–15.0)
Immature Granulocytes: 2 %
Lymphocytes Relative: 6 %
Lymphs Abs: 0.7 10*3/uL (ref 0.7–4.0)
MCH: 35.4 pg — ABNORMAL HIGH (ref 26.0–34.0)
MCHC: 28.7 g/dL — ABNORMAL LOW (ref 30.0–36.0)
MCV: 123.1 fL — ABNORMAL HIGH (ref 80.0–100.0)
MONOS PCT: 5 %
Monocytes Absolute: 0.6 10*3/uL (ref 0.1–1.0)
Neutro Abs: 10.3 10*3/uL — ABNORMAL HIGH (ref 1.7–7.7)
Neutrophils Relative %: 85 %
Platelets: 60 10*3/uL — ABNORMAL LOW (ref 150–400)
RBC: 2.29 MIL/uL — ABNORMAL LOW (ref 3.87–5.11)
RDW: 16.4 % — ABNORMAL HIGH (ref 11.5–15.5)
WBC: 12.2 10*3/uL — ABNORMAL HIGH (ref 4.0–10.5)
nRBC: 0.9 % — ABNORMAL HIGH (ref 0.0–0.2)

## 2018-02-22 LAB — LACTIC ACID, PLASMA: Lactic Acid, Venous: >11 mmol/L (ref 0.5–1.9)

## 2018-02-22 LAB — BLOOD GAS, ARTERIAL
ACID-BASE DEFICIT: 22.7 mmol/L — AB (ref 0.0–2.0)
Acid-base deficit: 22.4 mmol/L — ABNORMAL HIGH (ref 0.0–2.0)
Acid-base deficit: 19.7 mmol/L — ABNORMAL HIGH (ref 0.0–2.0)
BICARBONATE: 9.4 mmol/L — AB (ref 20.0–28.0)
BICARBONATE: 10.7 mmol/L — AB (ref 20.0–28.0)
Bicarbonate: 9.6 mmol/L — ABNORMAL LOW (ref 20.0–28.0)
Delivery systems: POSITIVE
Drawn by: 308601
Drawn by: 308601
Drawn by: 441261
Expiratory PAP: 8
FIO2: 100
FIO2: 100
FIO2: 100
Inspiratory PAP: 16
LHR: 28 {breaths}/min
MECHVT: 360 mL
Mode: POSITIVE
O2 Saturation: 91.6 %
O2 Saturation: 82.2 %
O2 Saturation: 93.8 %
PEEP: 5 cmH2O
PO2 ART: 90.9 mmHg (ref 83.0–108.0)
Patient temperature: 94
Patient temperature: 96.7
Patient temperature: 98.6
pCO2 arterial: 46.1 mmHg (ref 32.0–48.0)
pCO2 arterial: 51.6 mmHg — ABNORMAL HIGH (ref 32.0–48.0)
pCO2 arterial: 49.6 mmHg — ABNORMAL HIGH (ref 32.0–48.0)
pH, Arterial: 6.925 — CL (ref 7.350–7.450)
pH, Arterial: 6.882 — CL (ref 7.350–7.450)
pH, Arterial: 6.964 — CL (ref 7.350–7.450)
pO2, Arterial: 75.5 mmHg — ABNORMAL LOW (ref 83.0–108.0)
pO2, Arterial: 60.3 mmHg — ABNORMAL LOW (ref 83.0–108.0)

## 2018-02-22 LAB — BASIC METABOLIC PANEL
Anion gap: 22 — ABNORMAL HIGH (ref 5–15)
BUN: 27 mg/dL — ABNORMAL HIGH (ref 6–20)
CO2: 11 mmol/L — ABNORMAL LOW (ref 22–32)
Calcium: 6.6 mg/dL — ABNORMAL LOW (ref 8.9–10.3)
Chloride: 105 mmol/L (ref 98–111)
Creatinine, Ser: 4.11 mg/dL — ABNORMAL HIGH (ref 0.44–1.00)
GFR calc Af Amer: 13 mL/min — ABNORMAL LOW (ref >60)
GFR calc non Af Amer: 12 mL/min — ABNORMAL LOW (ref >60)
Glucose, Bld: 106 mg/dL — ABNORMAL HIGH (ref 70–99)
Potassium: 4.6 mmol/L (ref 3.5–5.1)
Sodium: 138 mmol/L (ref 135–145)

## 2018-02-22 LAB — GLUCOSE, CAPILLARY
Glucose-Capillary: 150 mg/dL — ABNORMAL HIGH (ref 70–99)
Glucose-Capillary: 49 mg/dL — ABNORMAL LOW (ref 70–99)
Glucose-Capillary: 68 mg/dL — ABNORMAL LOW (ref 70–99)
Glucose-Capillary: 74 mg/dL (ref 70–99)
Glucose-Capillary: 80 mg/dL (ref 70–99)

## 2018-02-22 MED ORDER — ALBUMIN HUMAN 25 % IV SOLN
INTRAVENOUS | Status: AC
  Administered 2018-02-22: 25 g
  Filled 2018-02-22: qty 100

## 2018-02-22 MED ORDER — ALBUTEROL SULFATE (2.5 MG/3ML) 0.083% IN NEBU
2.5000 mg | INHALATION_SOLUTION | RESPIRATORY_TRACT | Status: DC
  Administered 2018-02-22: 2.5 mg via RESPIRATORY_TRACT
  Filled 2018-02-22: qty 3

## 2018-02-22 MED ORDER — DEXTROSE 50 % IV SOLN
12.5000 g | INTRAVENOUS | Status: AC
  Administered 2018-02-22: 12.5 g via INTRAVENOUS

## 2018-02-22 MED ORDER — EPINEPHRINE PF 1 MG/ML IJ SOLN
0.5000 ug/min | INTRAVENOUS | Status: DC
  Administered 2018-02-22: 0.5 ug/min via INTRAVENOUS
  Filled 2018-02-22: qty 4

## 2018-02-22 MED ORDER — SODIUM BICARBONATE 8.4 % IV SOLN
INTRAVENOUS | Status: AC
  Filled 2018-02-22: qty 50

## 2018-02-22 MED ORDER — CALCIUM GLUCONATE-NACL 1-0.675 GM/50ML-% IV SOLN: 1.0000 g | Freq: Once | INTRAVENOUS | Status: DC

## 2018-02-22 MED ORDER — DEXMEDETOMIDINE HCL IN NACL 200 MCG/50ML IV SOLN: 0.0000 ug/kg/h | INTRAVENOUS | Status: DC

## 2018-02-22 MED ORDER — DEXTROSE 50 % IV SOLN
INTRAVENOUS | Status: AC
  Filled 2018-02-22: qty 50

## 2018-02-22 MED ORDER — SODIUM CHLORIDE 0.9 % IV BOLUS: 1000.0000 mL | Freq: Once | INTRAVENOUS | Status: DC

## 2018-02-22 MED ORDER — ETOMIDATE 2 MG/ML IV SOLN
INTRAVENOUS | Status: DC | PRN
  Administered 2018-02-22: 18 mg via INTRAVENOUS

## 2018-02-22 MED ORDER — HYDROCORTISONE NA SUCCINATE PF 100 MG IJ SOLR
100.0000 mg | Freq: Three times a day (TID) | INTRAMUSCULAR | Status: DC
  Administered 2018-02-22: 100 mg via INTRAVENOUS
  Filled 2018-02-22: qty 2

## 2018-02-22 MED ORDER — FENTANYL CITRATE (PF) 100 MCG/2ML IJ SOLN: 100.0000 ug | INTRAMUSCULAR | Status: DC | PRN

## 2018-02-22 MED ORDER — SODIUM CHLORIDE 0.9 % IV SOLN: 500.0000 mL | Freq: Once | INTRAVENOUS | Status: DC

## 2018-02-22 MED ORDER — SUCCINYLCHOLINE CHLORIDE 20 MG/ML IJ SOLN
INTRAMUSCULAR | Status: DC | PRN
  Administered 2018-02-22: 120 mg via INTRAVENOUS

## 2018-02-22 MED ORDER — PANTOPRAZOLE SODIUM 40 MG IV SOLR: 40.0000 mg | Freq: Every day | INTRAVENOUS | Status: DC

## 2018-02-22 MED ORDER — ORAL CARE MOUTH RINSE: 15.0000 mL | Freq: Two times a day (BID) | OROMUCOSAL | Status: DC

## 2018-02-22 MED ORDER — CALCIUM GLUCONATE 10 % IV SOLN
1.0000 g | INTRAVENOUS | Status: AC
  Administered 2018-02-22: 1 g via INTRAVENOUS
  Filled 2018-02-22: qty 10

## 2018-02-23 LAB — CULTURE, BLOOD (ROUTINE X 2): Special Requests: ADEQUATE

## 2018-02-25 ENCOUNTER — Ambulatory Visit: Payer: Medicaid Other | Admitting: Family Medicine

## 2018-02-25 LAB — CULTURE, BLOOD (ROUTINE X 2): Culture: NO GROWTH

## 2018-02-26 LAB — CULTURE, BODY FLUID W GRAM STAIN -BOTTLE
Culture: NO GROWTH
Special Requests: ADEQUATE

## 2018-03-01 ENCOUNTER — Telehealth: Payer: Self-pay | Admitting: *Deleted

## 2018-03-01 NOTE — Telephone Encounter (Signed)
Received D/C form [facility] [employer]-D/C Forwarded to Dr.Olalere to be signed.

## 2018-03-02 NOTE — Telephone Encounter (Signed)
Received signed original copy D/C- [facility]eral home notified along with faxing a copy to the funeral home as requested.

## 2018-03-07 NOTE — Progress Notes (Signed)
VAST RN responded to code blue. Working CL in place upon arrival. Team verbalized no other access needed at this time.

## 2018-03-07 NOTE — Progress Notes (Signed)
At time of report patient's blood pressure 44/30s. Critical care called and verbal order to give 100cc of 25% albumin and he was headed to bedside. Code called while entering room at 720. Family at bedside. Chest compression started. 4 Rounds of Epinephrine given. 0728 Bradycardia.    0741-PEA. Code called. Chest compression started. Dr. Wynona Neat at bedside. 2 epinephrines, 0.5 mg of atropine, 1 amp of bicarbonate given.  0745 ROSC attained.   MD had discussion with family.  No code status.   0830 patient passed. Family at bedside. Breath and lung sounds ausculted by Alba Cory, RN. Condolence given to family.

## 2018-03-07 NOTE — Progress Notes (Signed)
VAST RN responded to second code blue this am. Pt continues to have working CL. No further access needed at this time.

## 2018-03-07 NOTE — Procedures (Signed)
Arterial Catheter Insertion Procedure Note TAKAKO ASP [id number] Dec 21, 1965  Procedure: Insertion of Arterial Catheter  Indications: Blood pressure monitoring  Procedure Details Consent: Unable to obtain consent because of emergent medical necessity. Time Out: Verified patient identification, verified procedure, site/side was marked, verified correct patient position, special equipment/implants available, medications/allergies/relevent history reviewed, required imaging and test results available.  Performed  Maximum sterile technique was used including antiseptics, cap, gloves, gown, hand hygiene, mask and sheet. Skin prep: Chlorhexidine; local anesthetic administered 20 gauge catheter was inserted into left radial artery using the Seldinger technique. ULTRASOUND GUIDANCE USED: NO Evaluation Blood flow good; BP tracing good. Complications: No apparent complications.   Berton Bon 2018/03/20

## 2018-03-07 NOTE — Death Summary Note (Signed)
Marland Kitchen.  DEATH SUMMARY   Patient Details  Name: Linda DarnerJarryl D Schwanke MRN: [id number] DOB: 01-11-1965  Admission/Discharge Information   Admit Date:  02/24/2018  Date of Death:  10-17-18  Time of Death:  0830  Length of Stay: 2  Referring Physician: Mike Gipouglas, Andre, FNP   Reason(s) for Hospitalization  Spontaneous bacterial peritonitis, decompensation of liver cirrhosis  Diagnoses  Preliminary cause of death: septic shock Secondary Diagnoses (including complications and co-morbidities):  Active Problems:   Spontaneous bacterial peritonitis (HCC)   Palliative care encounter Cirrhosis of the liver Hepatitis C Recurrent large volume ascites  Brief Hospital Course (including significant findings, care, treatment, and services provided and events leading to death)  Linda Vaughn is a 53 y.o. year old female who presented to the hospital with abdominal pain, shortness of breath, intermittent confusion. She has had multiple paracenteses recently for liver cirrhosis. She continued to decompensate while hospitalized subsequently requiring to be on the ventilator.  She decompensated further requiring multiple pressors Pressors were maxed out this morning and despite aggressive care she progressed to asystole She was coded x2 I had extensive discussions with patient's family members including sister, multiple brothers and multiple other family members We made a DO NOT RESUSCITATE and about 7 to 10 minutes later patient continued to decompensate and died at 0830  Pertinent Labs and Studies  Significant Diagnostic Studies Ct Abdomen Pelvis Wo Contrast  Result Date: 02/12/2018 CLINICAL DATA:  Abdominal pain.  Recent paracentesis. EXAM: CT ABDOMEN AND PELVIS WITHOUT CONTRAST TECHNIQUE: Multidetector CT imaging of the abdomen and pelvis was performed following the standard protocol without IV contrast. COMPARISON:  01/04/2018 FINDINGS: Lower chest: Atelectasis and or consolidation in the right lower  lobe. Small right effusion. Left base is clear except for mild scarring. Hepatobiliary: Chronic cirrhosis of the liver with small nodular surface liver. Previous cholecystectomy. Pancreas: No pancreatic abnormality seen. Spleen: Normal size without focal lesion. Adrenals/Urinary Tract: No adrenal lesion is seen. Kidneys appear normal. Bladder is normal. Stomach/Bowel: No sign of bowel obstruction. Vascular/Lymphatic: Aortic atherosclerosis. No sign of aneurysm. No retroperitoneal adenopathy. Reproductive: Uterus and adnexal regions appear unremarkable, surrounded by ascites. Other: Massive amount of ascites, similar to multiple previous exams, diffusely distributed. Multilocular fluid density lesion in the anterior midline presumed to represent fluid extending through a hernia defect. Musculoskeletal: No acute bone finding. IMPRESSION: Massive ascites, similar to the previous study. Diffusely distributed. Chronic ascites Right lung base atelectasis and or pneumonia.  Small right effusion. Multi septated fluid lesion of the anterior abdominal midline seen chronically and presumed to represent extension of fluid throughout hernia defect. Electronically Signed   By: Paulina FusiMark  Shogry M.D.   On: 02/12/2018 15:09   Dg Chest 1 View  Result Date: 10-17-18 CLINICAL DATA:  Endotracheal intubation EXAM: CHEST  1 VIEW COMPARISON:  Yesterday FINDINGS: Endotracheal tube tip is 6 mm above the carina. Left IJ line with tip at the upper SVC. The orogastric tube reaches the stomach. Bilateral airspace disease, worsened. Normal heart size. No pneumothorax. These results will be called to the ordering clinician or representative by the Radiologist Assistant, and communication documented in the PACS or zVision Dashboard. IMPRESSION: 1. New endotracheal tube with tip 5 mm above the carina. Consider retraction by ~2 cm. 2. New orogastric tube reaches the stomach which has been decompressed. 3. Worsening airspace disease. Electronically  Signed   By: Marnee SpringJonathon  Watts M.D.   On: 010-11-20 04:08   Dg Chest 1 View  Result Date: 02/21/2018 CLINICAL DATA:  Hypoxia. History of cirrhosis, HCV and ETOH abuse. History of ascites requiring repeat paracentesis. EXAM: CHEST  1 VIEW COMPARISON:  Chest x-ray dated Mar 16, 2018 FINDINGS: New patchy airspace opacities throughout both lungs, with underlying interstitial prominence/thickening. Probable small RIGHT pleural effusion. Heart size and mediastinal contours appear stable. LEFT IJ central line appears stable in position with tip at the level of the mid SVC. IMPRESSION: New patchy airspace opacities throughout both lungs, with underlying interstitial prominence/thickening, compatible with pulmonary edema and/or multifocal pneumonia. Probable small RIGHT pleural effusion. Electronically Signed   By: Bary Richard M.D.   On: 02/21/2018 22:32   Ct Head Wo Contrast  Result Date: 03/16/2018 CLINICAL DATA:  Abdominal pain.  Confusion. EXAM: CT HEAD WITHOUT CONTRAST TECHNIQUE: Contiguous axial images were obtained from the base of the skull through the vertex without intravenous contrast. COMPARISON:  05/23/2017 FINDINGS: Brain: The brain shows a normal appearance without evidence of malformation, atrophy, old or acute small or large vessel infarction, mass lesion, hemorrhage, hydrocephalus or extra-axial collection. Vascular: No hyperdense vessel. No evidence of atherosclerotic calcification. Skull: Normal.  No traumatic finding.  No focal bone lesion. Sinuses/Orbits: Sinuses are clear. Orbits appear normal. Mastoids are clear. Other: None significant IMPRESSION: Normal head CT Electronically Signed   By: Paulina Fusi M.D.   On: 16-Mar-2018 14:55   US Paracentesis  Result Date: 02/18/2018 INDICATION: Patient with history of alcoholic cirrhosis/hepatitis and recurrent ascites. Request is made for therapeutic paracentesis. EXAM: ULTRASOUND GUIDED THERAPEUTIC PARACENTESIS MEDICATIONS: 20 mL of 1% lidocaine  COMPLICATIONS: None immediate. PROCEDURE: Informed written consent was obtained from the patient after a discussion of the risks, benefits and alternatives to treatment. A timeout was performed prior to the initiation of the procedure. Initial ultrasound scanning demonstrates a large amount of ascites within the right lower abdominal quadrant. The right lower abdomen was prepped and draped in the usual sterile fashion. 1% lidocaine was used for local anesthesia. Following this, a 19 gauge, 10-cm, Yueh catheter was introduced. An ultrasound image was saved for documentation purposes. The paracentesis was performed. The catheter was removed and a dressing was applied. The patient tolerated the procedure well without immediate post procedural complication. FINDINGS: A total of approximately 5 L of hazy gold fluid was removed. IMPRESSION: Successful ultrasound-guided paracentesis yielding 5 L of peritoneal fluid. Read by: Elwin Mocha, PA-C Electronically Signed   By: Richarda Overlie M.D.   On: 02/18/2018 17:42   US Paracentesis  Result Date: 02/02/2018 INDICATION: Patient with history of hepatitis-C, cirrhosis, recurrent ascites. Request made for therapeutic paracentesis. EXAM: ULTRASOUND GUIDED THERAPEUTIC PARACENTESIS MEDICATIONS: None COMPLICATIONS: None immediate. PROCEDURE: Informed written consent was obtained from the patient after a discussion of the risks, benefits and alternatives to treatment. A timeout was performed prior to the initiation of the procedure. Initial ultrasound scanning demonstrates a large amount of ascites within the left mid to lower abdominal quadrant. The left mid to lower abdomen was prepped and draped in the usual sterile fashion. 1% lidocaine was used for local anesthesia. Following this, a 19 gauge, 10-cm, Yueh catheter was introduced. An ultrasound image was saved for documentation purposes. The paracentesis was performed. The catheter was removed and a dressing was applied. The  patient tolerated the procedure well without immediate post procedural complication. FINDINGS: A total of approximately 6.1 liters of yellow fluid was removed. IMPRESSION: Successful ultrasound-guided therapeutic paracentesis yielding 6.1 liters of peritoneal fluid. Read by: Jeananne Rama, PA-C Electronically Signed   By: Judie Petit.  Shick M.D.   On:  02/02/2018 16:53   Dg Chest Port 1 View  Result Date: 03/04/18 CLINICAL DATA:  Central line placement EXAM: PORTABLE CHEST 1 VIEW COMPARISON:  Portable exam 1855 hours compared to 1428 hours FINDINGS: LEFT jugular central venous catheter with tip projecting over cavoatrial junction. Upper normal size of cardiac silhouette. Mediastinal contours and pulmonary vascularity normal. Mild bibasilar atelectasis and probable small RIGHT pleural effusion. No infiltrate or pneumothorax. IMPRESSION: No pneumothorax following LEFT jugular line placement. Bibasilar atelectasis and probable small RIGHT pleural effusion. Electronically Signed   By: Ulyses Southward M.D.   On: 04-Mar-2018 19:38   Dg Abdomen Acute W/chest  Result Date: 2018/03/04 CLINICAL DATA:  Abdominal pain right upper quadrant and left upper quadrant. Shortness of breath and confusion. EXAM: DG ABDOMEN ACUTE W/ 1V CHEST COMPARISON:  Recent ultrasound. FINDINGS: Diffuse abdominal opacity consistent with ascites. No sign of bowel obstruction or free air. No abnormal calcification or bone findings. One-view chest shows normal heart size and mediastinal shadows. There is basilar atelectasis. There is a small amount of pleural fluid on right. IMPRESSION: Increased abdominal opacity consistent with ascites. No acute bowel finding. No free air. Basilar atelectasis. Right effusion. Electronically Signed   By: Paulina Fusi M.D.   On: 03-04-18 14:53    Microbiology Recent Results (from the past 240 hour(s))  Body fluid culture (includes gram stain)     Status: None   Collection Time: 02/18/18  8:13 AM  Result Value Ref  Range Status   Specimen Description   Final    PERITONEAL Performed at [facility], [address] [place], [place] [address]    Special Requests   Final    NONE Performed at [facility], [address], [place], [place] [address]    Gram Stain   Final    RARE WBC PRESENT, PREDOMINANTLY MONONUCLEAR NO ORGANISMS SEEN    Culture   Final    NO GROWTH 3 DAYS Performed at [facility], [address] [place], [place] [address]    Report Status 02/21/2018 FINAL  Final  Blood Culture (routine x 2)     Status: None (Preliminary result)   Collection Time: 03/04/18  1:03 PM  Result Value Ref Range Status   Specimen Description   Final    BLOOD RIGHT FOREARM Performed at [facility], [address] [place], [place] [address]    Special Requests   Final    BOTTLES DRAWN AEROBIC AND ANAEROBIC Blood Culture adequate volume Performed at [facility], [address] [place], [place] [address]    Culture  Setup Time   Final    GRAM NEGATIVE RODS IN BOTH AEROBIC AND ANAEROBIC BOTTLES CRITICAL RESULT CALLED TO, READ BACK BY AND VERIFIED WITH: Trixie Deis [id number] 02/21/2018 Girtha Hake Performed at [facility], [address] [place], [place] [address]    Culture GRAM NEGATIVE RODS  Final   Report Status PENDING  Incomplete  Blood Culture ID Panel (Reflexed)     Status: Abnormal   Collection Time: 2018/03/04  1:03 PM  Result Value Ref Range Status   Enterococcus species NOT DETECTED NOT DETECTED Final   Listeria monocytogenes NOT DETECTED NOT DETECTED Final   Staphylococcus species NOT DETECTED NOT DETECTED Final   Staphylococcus aureus (BCID) NOT DETECTED NOT DETECTED Final   Streptococcus species NOT DETECTED NOT DETECTED Final   Streptococcus agalactiae NOT DETECTED NOT DETECTED Final   Streptococcus pneumoniae NOT DETECTED NOT  DETECTED Final   Streptococcus pyogenes NOT DETECTED NOT DETECTED  Final   Acinetobacter baumannii NOT DETECTED NOT DETECTED Final   Enterobacteriaceae species DETECTED (A) NOT DETECTED Final    Comment: Enterobacteriaceae represent a large family of gram-negative bacteria, not a single organism. CRITICAL RESULT CALLED TO, READ BACK BY AND VERIFIED WITH: J. GRIMSLEY,PHARMD 0535 02/21/2018 T. TYSOR    Enterobacter cloacae complex NOT DETECTED NOT DETECTED Final   Escherichia coli DETECTED (A) NOT DETECTED Final    Comment: CRITICAL RESULT CALLED TO, READ BACK BY AND VERIFIED WITH: J. GRIMSLEY,PHARMD 1610 02/21/2018 T. TYSOR    Klebsiella oxytoca NOT DETECTED NOT DETECTED Final   Klebsiella pneumoniae NOT DETECTED NOT DETECTED Final   Proteus species NOT DETECTED NOT DETECTED Final   Serratia marcescens NOT DETECTED NOT DETECTED Final   Carbapenem resistance NOT DETECTED NOT DETECTED Final   Haemophilus influenzae NOT DETECTED NOT DETECTED Final   Neisseria meningitidis NOT DETECTED NOT DETECTED Final   Pseudomonas aeruginosa NOT DETECTED NOT DETECTED Final   Candida albicans NOT DETECTED NOT DETECTED Final   Candida glabrata NOT DETECTED NOT DETECTED Final   Candida krusei NOT DETECTED NOT DETECTED Final   Candida parapsilosis NOT DETECTED NOT DETECTED Final   Candida tropicalis NOT DETECTED NOT DETECTED Final    Comment: Performed at [facility], [address] [place], [place] [address]  Blood Culture (routine x 2)     Status: None (Preliminary result)   Collection Time: Feb 21, 2018  3:25 PM  Result Value Ref Range Status   Specimen Description   Final    LEFT ANTECUBITAL Performed at [facility], [address], [place], [place] [address]    Special Requests   Final    BOTTLES DRAWN AEROBIC AND ANAEROBIC Blood Culture results may not be optimal due to an inadequate volume of blood received in culture bottles Performed at [facility], [address], [place], [place] [address]    Culture   Final     NO GROWTH < 24 HOURS Performed at [facility], [address] [place], [place] [address]    Report Status PENDING  Incomplete  Gram stain     Status: None   Collection Time: 02/21/18  3:29 PM  Result Value Ref Range Status   Specimen Description FLUID PERITONEAL  Final   Special Requests NONE  Final   Gram Stain   Final    ABUNDANT WBC PRESENT, PREDOMINANTLY PMN FEW GRAM VARIABLE ROD Performed at [facility], [address], [place], [place] [address]    Report Status 02/21/2018 FINAL  Final  MRSA PCR Screening     Status: None   Collection Time: 2018/02/21  7:40 PM  Result Value Ref Range Status   MRSA by PCR NEGATIVE NEGATIVE Final    Comment:        The GeneXpert MRSA Assay (FDA approved for NASAL specimens only), is one component of a comprehensive MRSA colonization surveillance program. It is not intended to diagnose MRSA infection nor to guide or monitor treatment for MRSA infections. Performed at [facility], [address] [place], [place] [address]     Lab Basic Metabolic Panel: Recent Labs  Lab 02/18/18 0728 02/18/18 1101 February 21, 2018 1303 02/21/18 0425 02/21/18 1300 02/21/2018 0447  NA 133*  --  136 139 135 138  K 4.0 4.2 2.9* 3.6 3.9 4.6  CL 102  --  99 107 104 105  CO2  25  --  12* 10* 9* 11*  GLUCOSE 89  --  48* 57* 226* 106*  BUN 10  --  21* 24* 25* 27*  CREATININE 0.59  --  2.94* 2.93* 3.53* 4.11*  CALCIUM 7.5*  --  8.0* 7.0* 6.9* 6.6*  MG  --   --   --  1.4*  --   --   PHOS  --   --   --  7.5*  --   --    Liver Function Tests: Recent Labs  Lab 02/18/18 0728 02/28/2018 1303  AST 85* 79*  ALT 22 27  ALKPHOS 162* 101  BILITOT 3.2* 7.6*  PROT 6.7 8.1  ALBUMIN 1.6* 1.7*   Recent Labs  Lab 02/18/18 0728  LIPASE 31   Recent Labs  Lab 03/04/2018 1431  AMMONIA 25   CBC: Recent Labs  Lab 02/18/18 0728 02/13/2018 1303 02/21/18 0425 03-23-18 0447  WBC 6.0 5.9 6.4 12.2*  NEUTROABS  --  4.2  --  10.3*  HGB  10.3* 12.9 10.1* 8.1*  HCT 30.7* 41.5 33.6* 28.2*  MCV 102.3* 110.1* 117.5* 123.1*  PLT 197 196 131* 60*   Cardiac Enzymes: No results for input(s): CKTOTAL, CKMB, CKMBINDEX, TROPONINI in the last 168 hours. Sepsis Labs: Recent Labs  Lab 02/18/18 0728 02/13/2018 1303 02/15/2018 1503 02/17/2018 1910 02/21/18 0425 03-23-18 0447  WBC 6.0 5.9  --   --  6.4 12.2*  LATICACIDVEN  --  >11.0* >11.0* >11.0*  --  >11.0*    Procedures/Operations  Arterial line placement: 03/23/2018 Central venous access placement 02/21/2018   Cassaundra Rasch A Jawanza Zambito 23-Mar-2018, 8:37 AM

## 2018-03-07 NOTE — Progress Notes (Signed)
Pt temp 92.8 rectally, and when turned on side RN noticed small amount of vomit come out of pt mouth. RN applied bear hugger Pt not alert, just moaning when turned. RN notified Elink MD Singasani of pt temp and vomit and he said to keep bear hugger on and just to continue to monitor pt.

## 2018-03-07 NOTE — Progress Notes (Signed)
I joined family as physician was updating family of pt's condition. Family made the decision to allow pt to "sleep" and let her go. They were appropriately tearful. At the appropriate time, family moved from conference room area to pt's room and most were bedside when pt passed. Again, they were tearful and a couple needed individual assistance which I provided. During time w/family I provided emotional, spiritual support and presence. Please page if additional assistance is needed. Chaplain Marjory Lies, MDiv   March 21, 2018 0900  Clinical Encounter Type  Visited With Family

## 2018-03-07 NOTE — Progress Notes (Signed)
NAME:  Linda Vaughn, MRN:  [id number], DOB:  06/07/1965, LOS: 2 ADMISSION DATE:  2018/03/07, CONSULTATION DATE:  03/07/18 REFERRING MD:  Calvert Cantor, CHIEF COMPLAINT:  Hypotension.   HPI/course in hospital  53 year old woman with known alcoholic and HCV cirrhosis with multiple visits to ED for paracentesis. Last presented 3 days ago with tense ascites and underwent paracentesis and sent home. No labs were sent.  Returns 2/15 with abdominal pain, SOB and intermittent confusion following paracentesis.   Given 3L of fluids and started on NE.   Cirrhosis followed at [facility], follow-up appears to be limited.  Patient with continued deterioration this morning Intubated overnight Code called this morning Coded for second time Currently maxed out on pressors Multiple colloid infusion Continues on crystalloids Had extensive discussion with patient's sister and brothers and other family members-family meeting has been called for this morning  Patient made DO NOT RESUSCITATE Ongoing discussions regarding continuation of care which at present is futile.  Interim history/subjective:  Coded this morning  Objective   Blood pressure (!) 131/117, pulse 84, temperature (!) 96 F (35.6 C), temperature source Axillary, resp. rate (!) 24, height 5' (1.524 m), weight 92.9 kg, SpO2 93 %.    Vent Mode: PRVC FiO2 (%):  [100 %] 100 % Set Rate:  [14 bmp-28 bmp] 28 bmp Vt Set:  [360 mL] 360 mL PEEP:  [5 cmH20-8 cmH20] 5 cmH20 Plateau Pressure:  [24 cmH20] 24 cmH20   Intake/Output Summary (Last 24 hours) at 02/12/2018 0821 Last data filed at 02/25/2018 0606 Gross per 24 hour  Intake 5432.69 ml  Output 135 ml  Net 5297.69 ml   Filed Weights   03/07/2018 2308 03/05/2018 0500  Weight: 86.7 kg 92.9 kg    Examination: remains unchanged Physical Exam  Constitutional:  intubated  HENT:  Head: Normocephalic and atraumatic.  Right Ear: External ear normal.  Left Ear: External ear normal.    Mouth/Throat: Mucous membranes are dry.  Eyes: Right eye exhibits no discharge. Left eye exhibits discharge. Scleral icterus is present.  Neck: Trachea normal. Neck supple.  Cardiovascular: Regular rhythm and normal heart sounds. Tachycardia present.  Brisk capillary refill.  Respiratory: Breath sounds normal. No accessory muscle usage. Tachypnea noted. No respiratory distress.  GI: She exhibits distension and fluid wave. There is generalized abdominal tenderness. A hernia is present. Hernia confirmed positive in the ventral area.  Reducible periumbilical hernia  Genitourinary:    Genitourinary Comments: Foley catheter in place. No urine output.   Neurological: She is unresponsive.  unresponsive     Ancillary tests (personally reviewed)  CBC: Recent Labs  Lab 02/18/18 0728 2018/03/07 1303 02/21/18 0425 02/24/2018 0447  WBC 6.0 5.9 6.4 12.2*  NEUTROABS  --  4.2  --  10.3*  HGB 10.3* 12.9 10.1* 8.1*  HCT 30.7* 41.5 33.6* 28.2*  MCV 102.3* 110.1* 117.5* 123.1*  PLT 197 196 131* 60*    Basic Metabolic Panel: Recent Labs  Lab 02/18/18 0728 02/18/18 1101 2018-03-07 1303 02/21/18 0425 02/21/18 1300 02/25/2018 0447  NA 133*  --  136 139 135 138  K 4.0 4.2 2.9* 3.6 3.9 4.6  CL 102  --  99 107 104 105  CO2 25  --  12* 10* 9* 11*  GLUCOSE 89  --  48* 57* 226* 106*  BUN 10  --  21* 24* 25* 27*  CREATININE 0.59  --  2.94* 2.93* 3.53* 4.11*  CALCIUM 7.5*  --  8.0* 7.0* 6.9* 6.6*  MG  --   --   --  1.4*  --   --   PHOS  --   --   --  7.5*  --   --    GFR: Estimated Creatinine Clearance: 15.9 mL/min (A) (by C-G formula based on SCr of 4.11 mg/dL (H)). Recent Labs  Lab 02/18/18 0728 02/13/2018 1303 02/18/2018 1503 02/23/2018 1910 02/21/18 0425 03-13-2018 0447  WBC 6.0 5.9  --   --  6.4 12.2*  LATICACIDVEN  --  >11.0* >11.0* >11.0*  --  >11.0*    Liver Function Tests: Recent Labs  Lab 02/18/18 0728 02/23/2018 1303  AST 85* 79*  ALT 22 27  ALKPHOS 162* 101  BILITOT 3.2* 7.6*   PROT 6.7 8.1  ALBUMIN 1.6* 1.7*   Recent Labs  Lab 02/18/18 0728  LIPASE 31   Recent Labs  Lab 03/04/2018 1431  AMMONIA 25   ABG    Component Value Date/Time   PHART 6.964 (LL) March 13, 2018 0752   PCO2ART 49.6 (H) 2018/03/13 0752   PO2ART 90.9 03/13/18 0752   HCO3 10.7 (L) Mar 13, 2018 0752   TCO2 28 12/27/2017 0915   ACIDBASEDEF 19.7 (H) 03/13/2018 0752   O2SAT 93.8 Mar 13, 2018 0752    Coagulation Profile: Recent Labs  Lab 02/18/18 0842 02/19/2018 1303  INR 1.78 2.14    Cardiac Enzymes: No results for input(s): CKTOTAL, CKMB, CKMBINDEX, TROPONINI in the last 168 hours.  HbA1C: Hemoglobin A1C  Date/Time Value Ref Range Status  03/16/2017 11:21 AM 4.8  Final   Hgb A1c MFr Bld  Date/Time Value Ref Range Status  01/25/2015 03:05 PM 4.3 <5.7 % Final    Comment:                                                                           According to the ADA Clinical Practice Recommendations for 2011, when HbA1c is used as a screening test:     >=6.5%   Diagnostic of Diabetes Mellitus            (if abnormal result is confirmed)   5.7-6.4%   Increased risk of developing Diabetes Mellitus   References:Diagnosis and Classification of Diabetes Mellitus,Diabetes Care,2011,34(Suppl 1):S62-S69 and Standards of Medical Care in         Diabetes - 2011,Diabetes Care,2011,34 (Suppl 1):S11-S61.       CBG: Recent Labs  Lab 02/21/18 2004 02/21/18 2128 2018-03-13 0004 03-13-2018 0340 Mar 13, 2018 0423  GLUCAP 85 82 74 68* 80     Assessment & Plan:  Critically ill due to septic shock requiring fluid resuscitation and initiation of vasopressors to keep MAP>60. Now on vasopressin and norepinephrine continued deterioration Family meeting this morning Patient's current condition is not reversible Post CPR x2 this morning Continued deterioration of hemodynamics despite aggressive intervention  Source likely SBP given abdominal pain.  Hepatic encephalopathy  Alcoholic HCV  cirrhosis with coagulopathy and hypoalbuminemia.  MELD score was 33 02/21/2018- mortality risk high if no prompt improvement.  Acute kidney injury likely secondary to volume contraction.  Creatinine has leveled off with current resuscitation.  Marked lactic acidosis - exaggerated due to concomitant liver and renal failure.   Hypoglycemia from hepatic failure   PLAN: Maximal doses of  pressors  Despite aggressive interventions He does appear certain that we will not be able to reverse ongoing deterioration as we have maxed out on pressors at present  Continue fluid resuscitation - Crystalloid and albumin given recent paracentesis. Titrate NE to keep MAP 60-65 as perfusion appears adequate.  Continue lactulose Bicarbonate infusion to correct acidosis, will help vasopressor requirements. Correct coagulopathy with vitamin K   We will continue to spend time with family to try to ascertain direction of ongoing care   Best practice:  Patient made DNR following family discussion Will continue to evaluate  Critical care time: 35 min including, chart review, examination of the patient and discussion of patient's condition with family and staff and assessment and modification of fluid and vasopressor therapy.    Tabia Landowski   2018/06/26, 8:21 AM
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
## 2018-03-07 NOTE — Anesthesia Procedure Notes (Signed)
Procedure Name: Intubation Performed by: Gean Maidens, CRNA Pre-anesthesia Checklist: Patient identified, Emergency Drugs available, Suction available, Patient being monitored and Timeout performed Patient Re-evaluated:Patient Re-evaluated prior to induction Oxygen Delivery Method: Ambu bag Preoxygenation: Pre-oxygenation with 100% oxygen Induction Type: IV induction Ventilation: Mask ventilation without difficulty Laryngoscope Size: Mac and 4 Grade View: Grade I Tube type: Subglottic suction tube Tube size: 7.5 mm Number of attempts: 1 Airway Equipment and Method: Stylet Placement Confirmation: ETT inserted through vocal cords under direct vision,  CO2 detector and breath sounds checked- equal and bilateral Secured at: 22 cm Tube secured with: Tape Dental Injury: Teeth and Oropharynx as per pre-operative assessment

## 2018-03-07 NOTE — Progress Notes (Signed)
CRITICAL VALUE ALERT  Critical Value:  >11  Date & Time Notied:  0600 Mar 22, 2018  Provider Notified: No   Orders Received/Actions taken: None

## 2018-03-07 NOTE — Progress Notes (Signed)
Patient did have acute hypercapnic and hypoxic respiratory failure

## 2018-03-07 NOTE — Progress Notes (Signed)
Patient deceased; ETT removed.

## 2018-03-07 DEATH — deceased

## 2018-03-31 ENCOUNTER — Ambulatory Visit: Payer: Medicaid Other | Admitting: Nurse Practitioner

## 2018-05-13 NOTE — Telephone Encounter (Signed)
Message sent to provider 

## 2019-12-25 IMAGING — CT CT ABD-PELV W/ CM
2 of 5 series · 16 of 46 positions shown, 18 images · IV contrast (ISOVUE)
Comparison: CT abdomen pelvis 12/13/2017

CLINICAL DATA: Abdominal pain and distension. Umbilical hernia.

EXAM:
CT ABDOMEN AND PELVIS WITH CONTRAST
TECHNIQUE: Multidetector CT imaging of the abdomen and pelvis was performed
using the standard protocol following bolus administration of
intravenous contrast.
CONTRAST:  100mL 5SHZP9-K11 IOPAMIDOL (5SHZP9-K11) INJECTION 61%

[Series 3: axial st · axial · 0.98mm/px · z∈[+1155,+1550]mm · 13 of 93 slices shown, 15 images]
[im 7/93  soft-tissue]
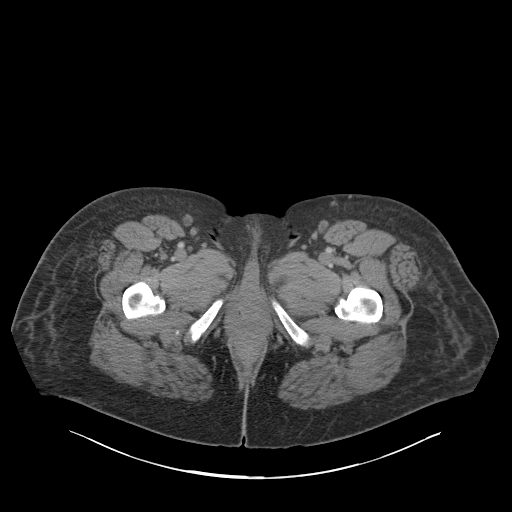
[im 7/93  bone]
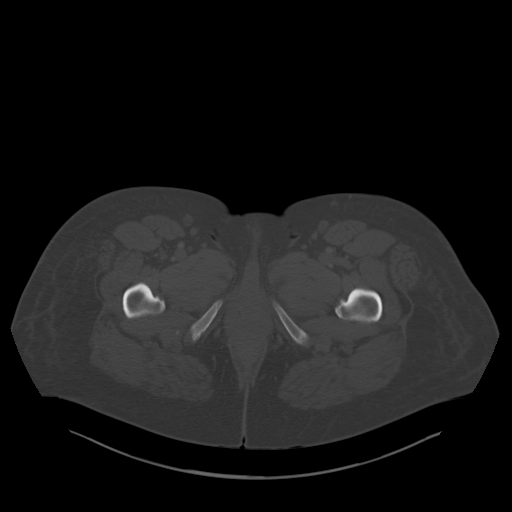
[im 13/93  soft-tissue]
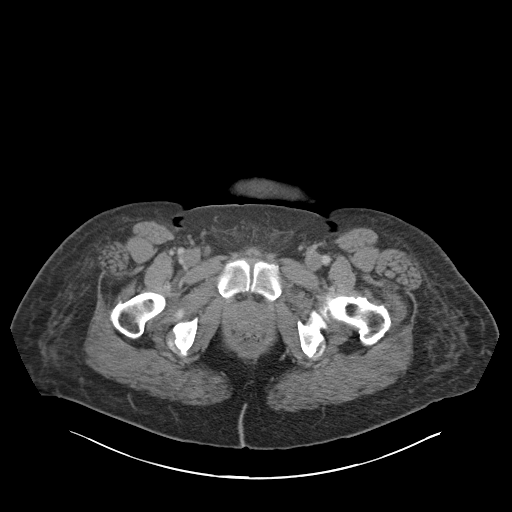
[im 19/93  soft-tissue]
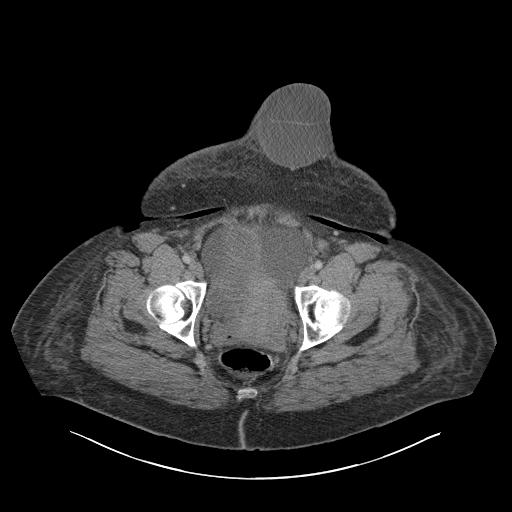
[im 25/93  soft-tissue]
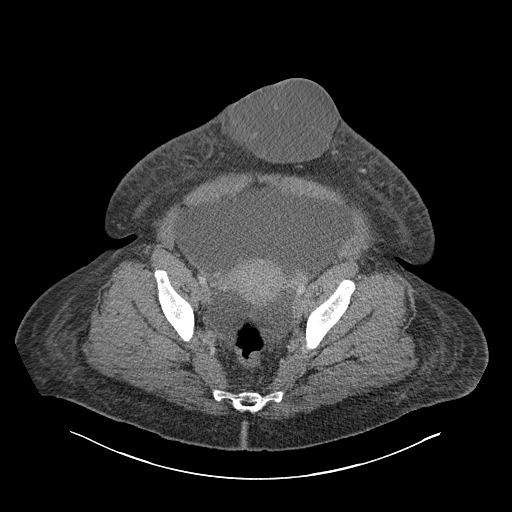
[im 31/93  soft-tissue]
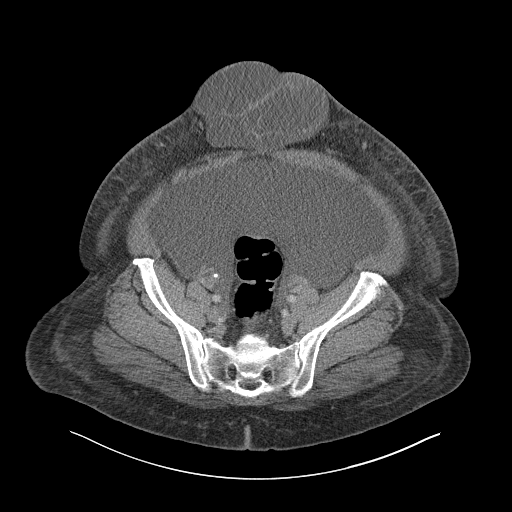
[im 37/93  soft-tissue]
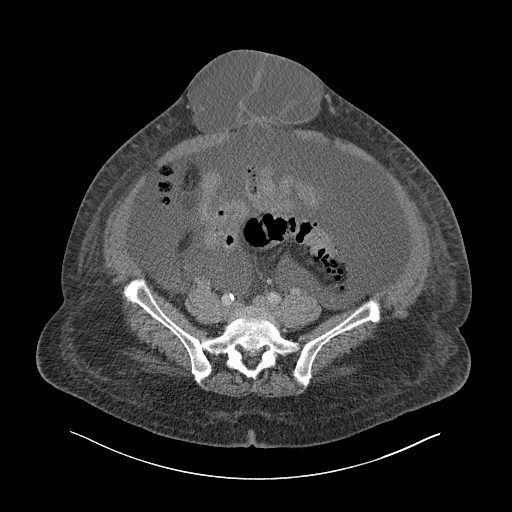
[im 50/93  soft-tissue]
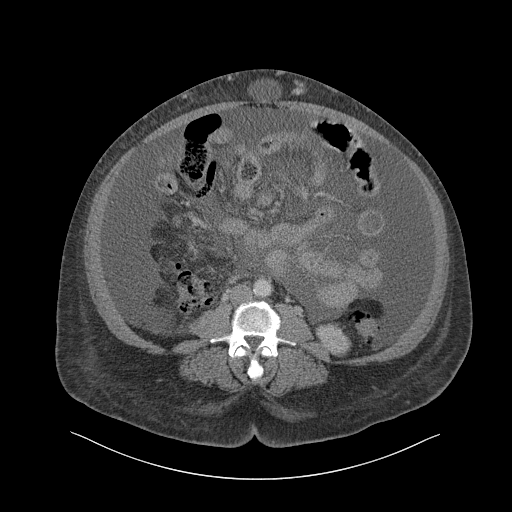
[im 56/93  soft-tissue]
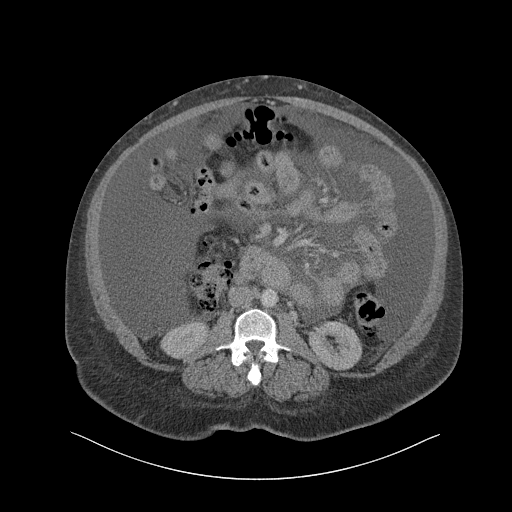
[im 62/93  soft-tissue]
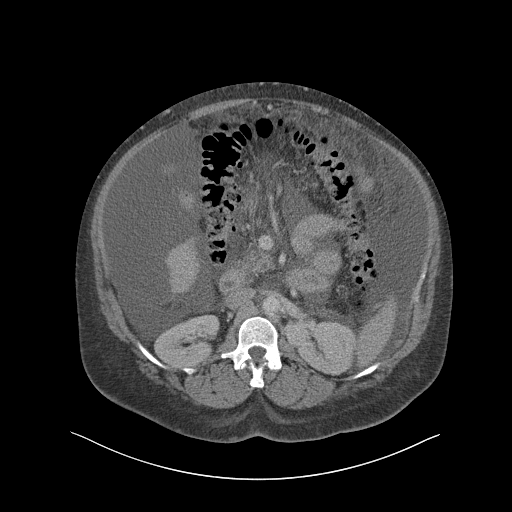
[im 62/93  bone]
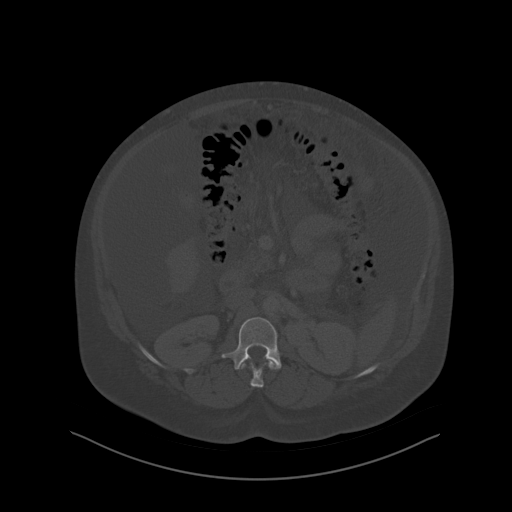
[im 68/93  soft-tissue]
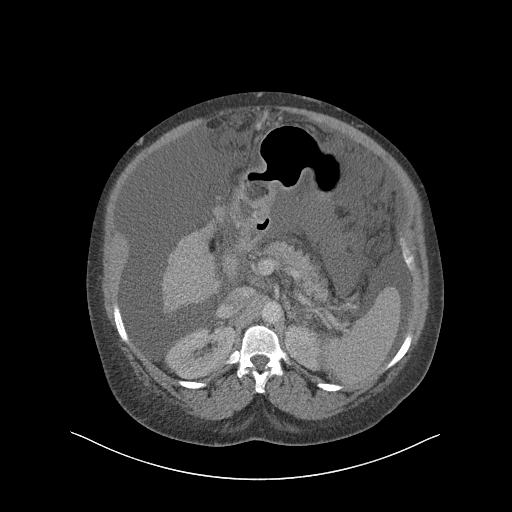
[im 74/93  soft-tissue]
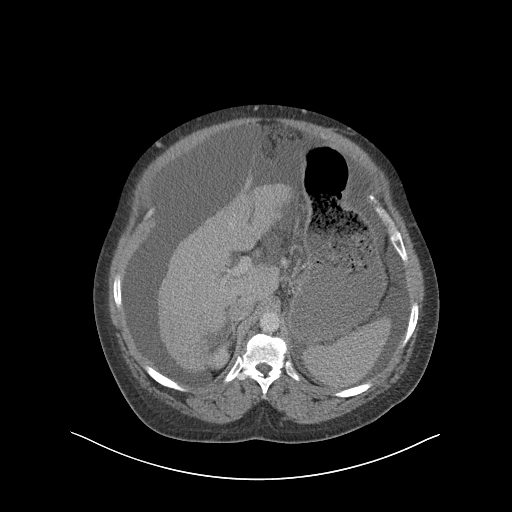
[im 80/93  soft-tissue]
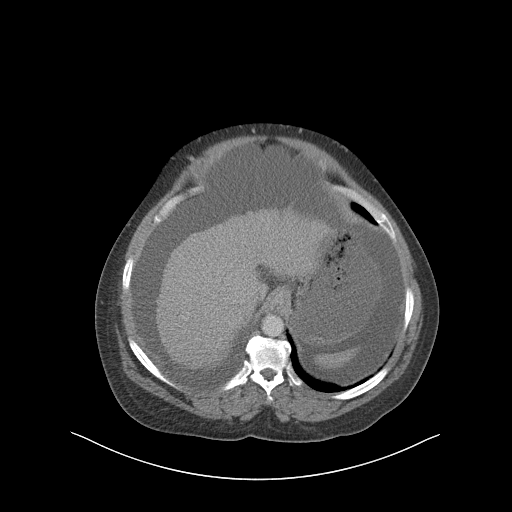
[im 86/93  soft-tissue]
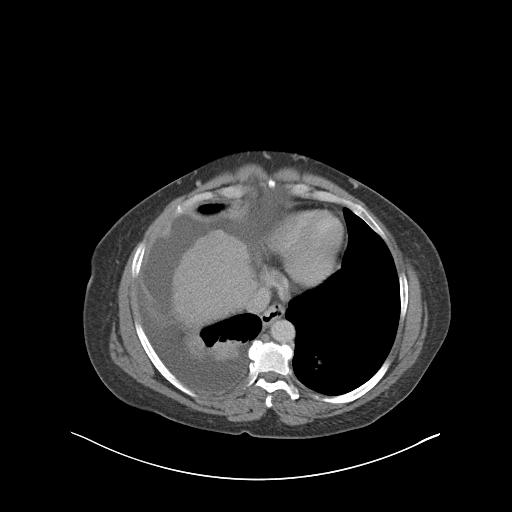

[Series 5: coronal st · coronal · 0.88mm/px · 3 of 138 slices shown]
[im 46/138  soft-tissue]
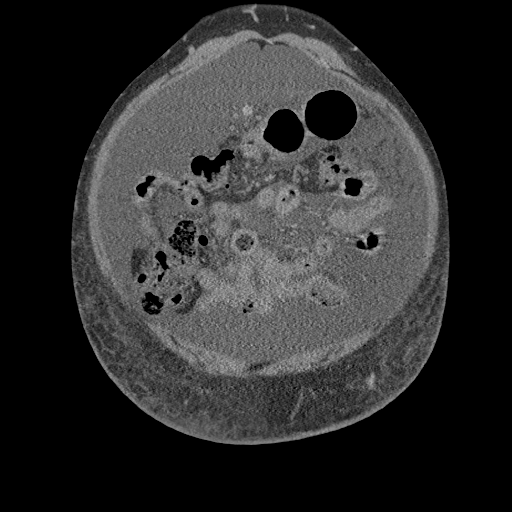
[im 61/138  soft-tissue]
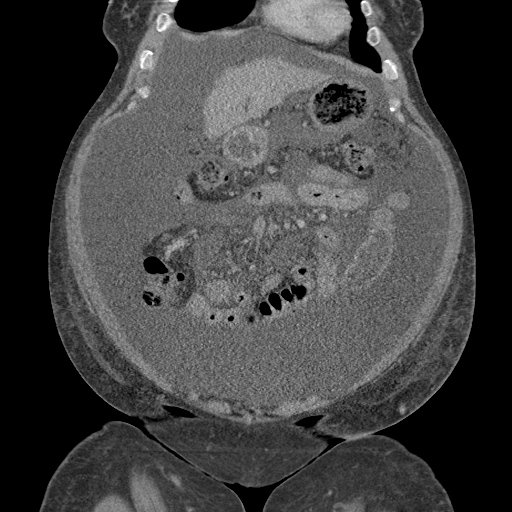
[im 77/138  soft-tissue]
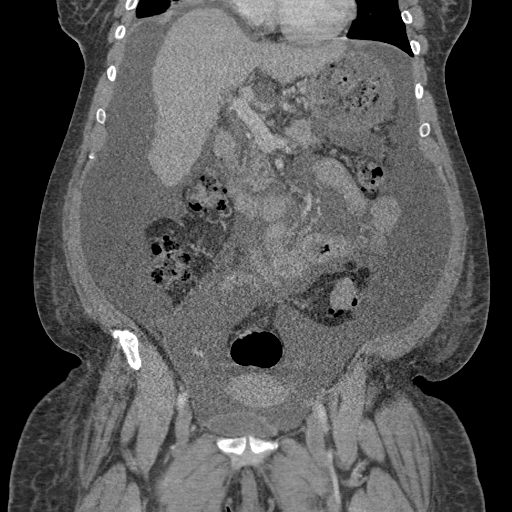

[16 of 46 positions shown; findings below may reference images not displayed]

FINDINGS: LOWER CHEST: Small right pleural effusion with associated
atelectasis.

HEPATOBILIARY: Diffusely nodular hepatic contours with overall
shrunken size, consistent with hepatic cirrhosis. No focal liver
lesion. No biliary dilatation. Status post cholecystectomy. There is
a large amount of ascites throughout the abdomen and pelvis.

PANCREAS: The pancreatic parenchymal contours are normal and there
is no ductal dilatation. There is no peripancreatic fluid
collection.

SPLEEN: Normal.

ADRENALS/URINARY TRACT:

--Adrenal glands: Normal.

--Right kidney/ureter: No hydronephrosis, nephroureterolithiasis,
perinephric stranding or solid renal mass.

--Left kidney/ureter: No hydronephrosis, nephroureterolithiasis,
perinephric stranding or solid renal mass.

--Urinary bladder: Normal for degree of distention

STOMACH/BOWEL:

--Stomach/Duodenum: There is no hiatal hernia or other gastric
abnormality. The duodenal course and caliber are normal.

--Small bowel: No dilatation or inflammation.

--Colon: No focal abnormality.

--Appendix: Normal.

VASCULAR/LYMPHATIC: There is aortic atherosclerosis without
hemodynamically significant stenosis. The portal vein, splenic vein,
superior mesenteric vein and IVC are patent. No abdominal or pelvic
lymphadenopathy.

REPRODUCTIVE: Normal uterus and ovaries.

MUSCULOSKELETAL. No bony spinal canal stenosis or focal osseous
abnormality.

OTHER: There is a multiloculated fluid collection within a ventral
abdominal hernia, measuring approximately 13.7 x 8.2 x 17.3 cm.
IMPRESSION: 1. Large multiloculated fluid collection within a ventral abdominal
hernia, measuring 13.7 x 8.2 x 17.3 cm, favored to be chronic
encysted ascites.
2. Hepatic cirrhosis and large volume ascites.
3. Small right pleural effusion with associated atelectasis.

## 2020-01-02 IMAGING — US US PARACENTESIS
1 series · 5 of 5 positions shown · non-contrast
Comparison: none

INDICATION: Patient with history of hepatitis-C, cirrhosis, recurrent ascites.
Request made for diagnostic and therapeutic paracentesis.

[Series 1: us paracentesis · 5 of 5 slices shown]
[im 1/5]
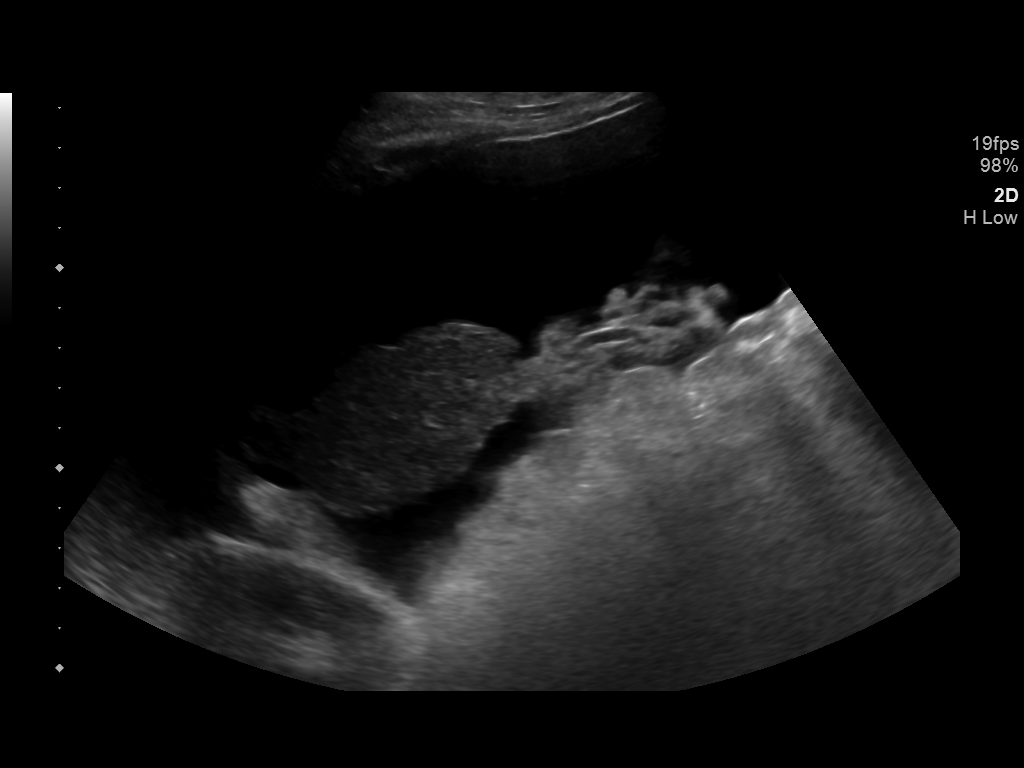
[im 2/5]
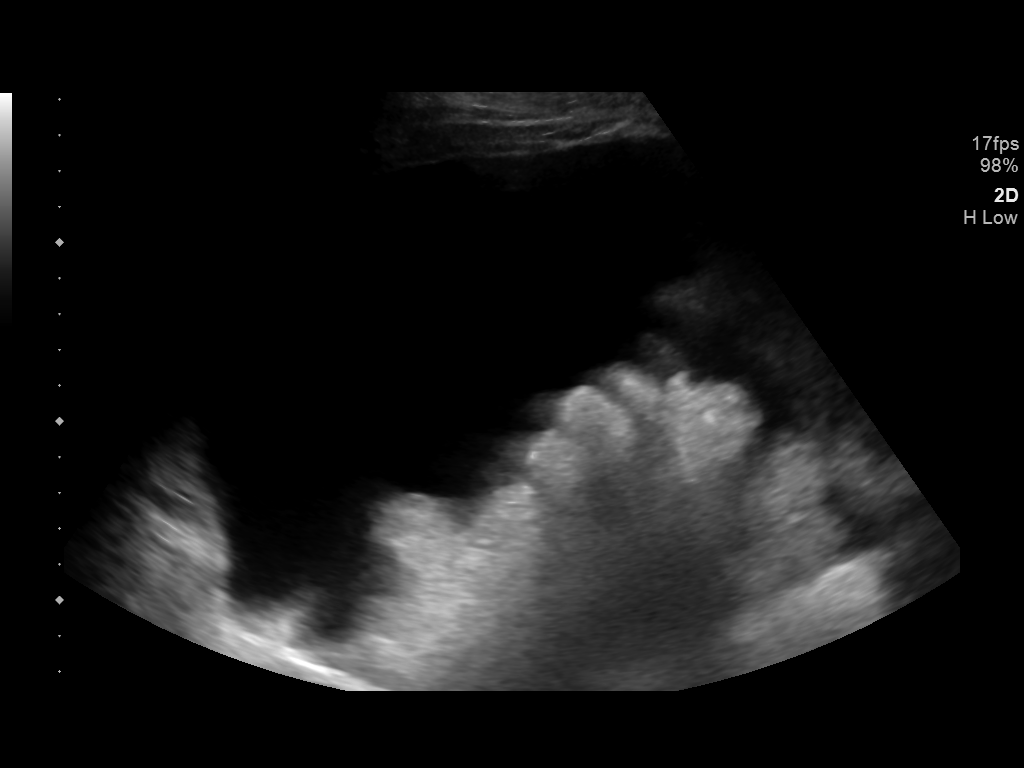
[im 3/5]
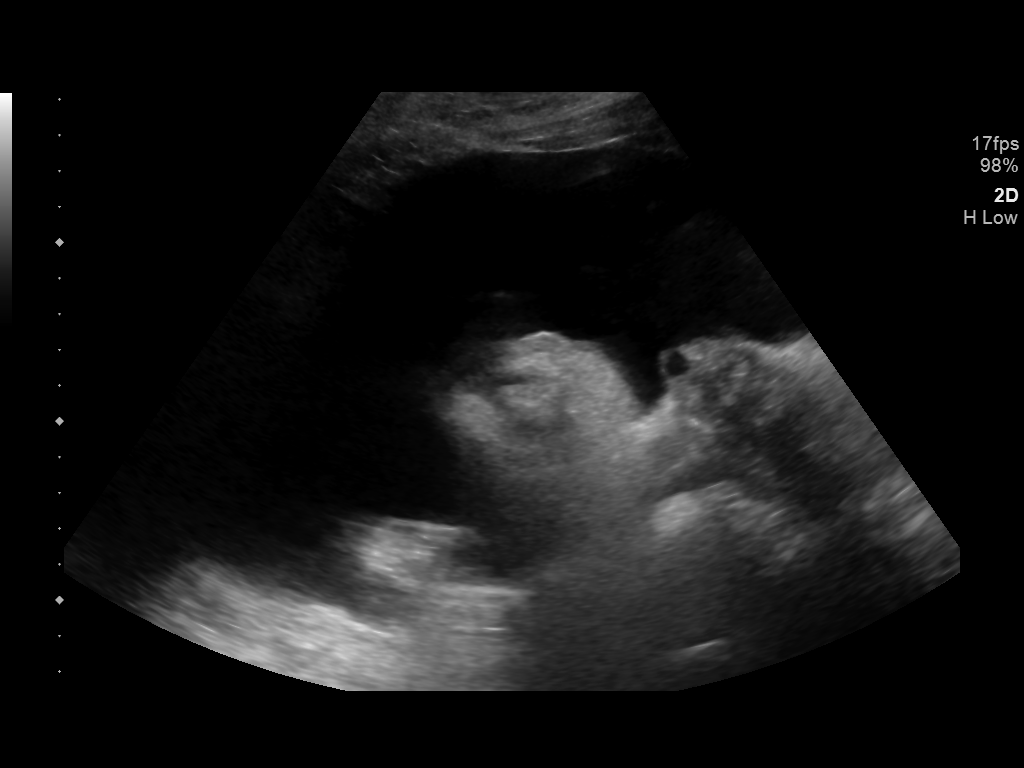
[im 4/5]
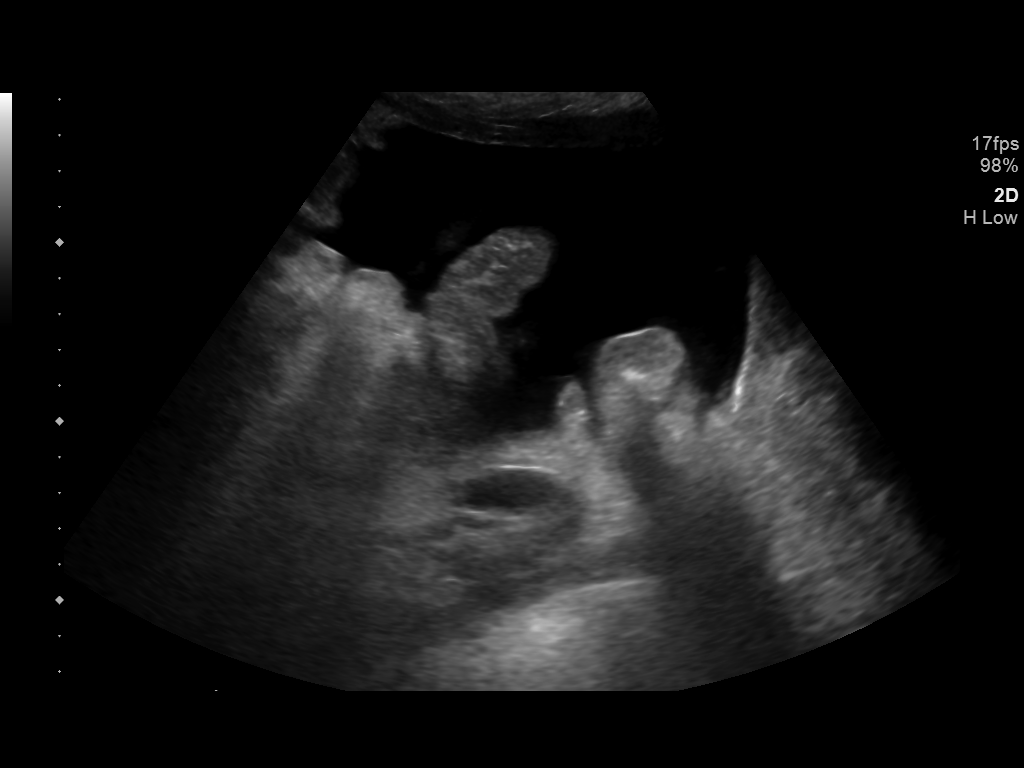
[im 5/5]
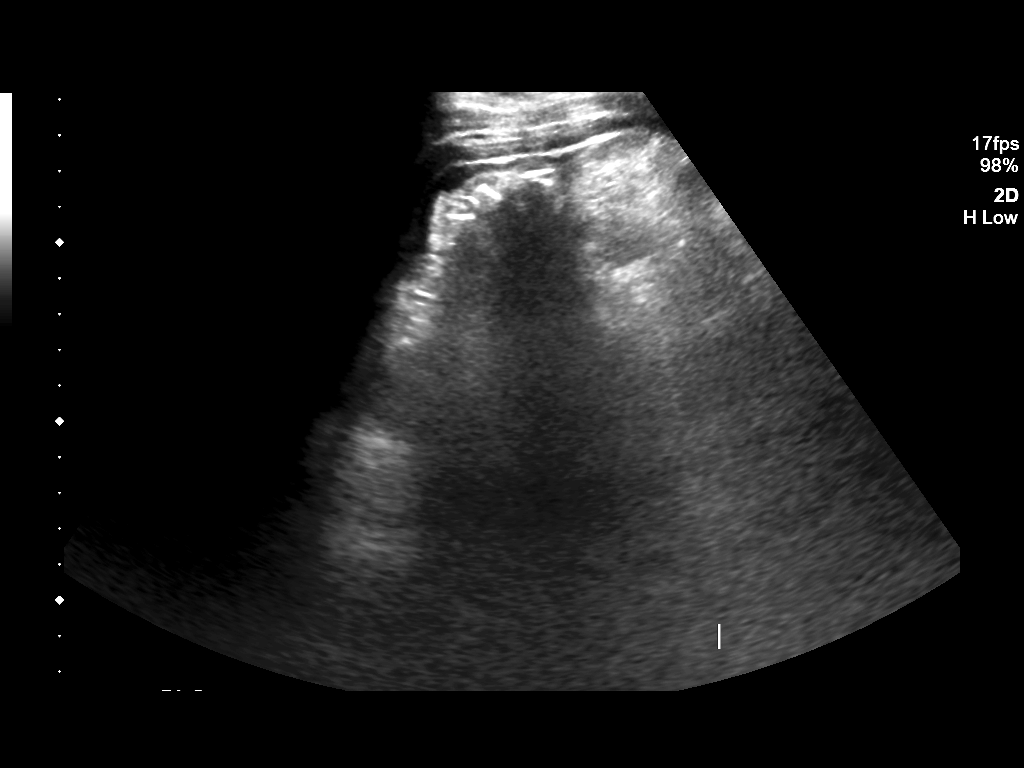

[5 of 5 positions shown; findings below may reference images not displayed]

EXAM:
ULTRASOUND GUIDED DIAGNOSTIC AND THERAPEUTIC PARACENTESIS

MEDICATIONS:
None

COMPLICATIONS:
None immediate.

PROCEDURE:
Informed written consent was obtained from the patient after a
discussion of the risks, benefits and alternatives to treatment. A
timeout was performed prior to the initiation of the procedure.

Initial ultrasound scanning demonstrates a large amount of ascites
within the right mid to lower abdominal quadrant. The right mid to
lower abdomen was prepped and draped in the usual sterile fashion.
1% lidocaine was used for local anesthesia.

Following this, a 19 gauge, 7-cm, Yueh catheter was introduced. An
ultrasound image was saved for documentation purposes. The
paracentesis was performed. The catheter was removed and a dressing
was applied. The patient tolerated the procedure well without
immediate post procedural complication.
FINDINGS: A total of approximately 6.3 liters of yellow fluid was removed.
Samples were sent to the laboratory as requested by the clinical
team.
IMPRESSION: Successful ultrasound-guided diagnostic and therapeutic paracentesis
yielding 6.3 liters of peritoneal fluid.
